# Patient Record
Sex: Female | Born: 1950 | Race: White | Hispanic: No | Marital: Married | Smoking: Never smoker
Health system: Southern US, Community
[De-identification: ages and names within clinical notes are randomized; demographics above are authoritative.]

## PROBLEM LIST (undated history)

## (undated) DIAGNOSIS — F419 Anxiety disorder, unspecified: Secondary | ICD-10-CM

## (undated) DIAGNOSIS — K7460 Unspecified cirrhosis of liver: Secondary | ICD-10-CM

## (undated) DIAGNOSIS — C801 Malignant (primary) neoplasm, unspecified: Secondary | ICD-10-CM

## (undated) DIAGNOSIS — R112 Nausea with vomiting, unspecified: Secondary | ICD-10-CM

## (undated) DIAGNOSIS — I10 Essential (primary) hypertension: Secondary | ICD-10-CM

## (undated) DIAGNOSIS — D519 Vitamin B12 deficiency anemia, unspecified: Principal | ICD-10-CM

## (undated) DIAGNOSIS — I499 Cardiac arrhythmia, unspecified: Secondary | ICD-10-CM

## (undated) DIAGNOSIS — Z974 Presence of external hearing-aid: Secondary | ICD-10-CM

## (undated) DIAGNOSIS — Z9621 Cochlear implant status: Secondary | ICD-10-CM

## (undated) DIAGNOSIS — M797 Fibromyalgia: Secondary | ICD-10-CM

## (undated) DIAGNOSIS — K219 Gastro-esophageal reflux disease without esophagitis: Secondary | ICD-10-CM

## (undated) DIAGNOSIS — M1990 Unspecified osteoarthritis, unspecified site: Secondary | ICD-10-CM

## (undated) DIAGNOSIS — Z9889 Other specified postprocedural states: Secondary | ICD-10-CM

## (undated) HISTORY — DX: Malignant (primary) neoplasm, unspecified: C80.1

## (undated) HISTORY — DX: Unspecified osteoarthritis, unspecified site: M19.90

## (undated) HISTORY — PX: APPENDECTOMY: SHX54

## (undated) HISTORY — DX: Essential (primary) hypertension: I10

## (undated) HISTORY — PX: COCHLEAR IMPLANT: SUR684

## (undated) HISTORY — DX: Unspecified cirrhosis of liver: K74.60

## (undated) HISTORY — DX: Anxiety disorder, unspecified: F41.9

## (undated) HISTORY — DX: Fibromyalgia: M79.7

## (undated) HISTORY — DX: Vitamin B12 deficiency anemia, unspecified: D51.9

## (undated) HISTORY — DX: Gastro-esophageal reflux disease without esophagitis: K21.9

## (undated) HISTORY — PX: COMBINED HYSTEROSCOPY DIAGNOSTIC / D&C: SUR297

---

## 1998-07-06 ENCOUNTER — Other Ambulatory Visit: Admission: RE | Admit: 1998-07-06 | Discharge: 1998-07-06 | Payer: Self-pay | Admitting: Obstetrics and Gynecology

## 1998-12-10 ENCOUNTER — Encounter: Payer: Self-pay | Admitting: Endocrinology

## 1998-12-10 ENCOUNTER — Ambulatory Visit (HOSPITAL_COMMUNITY): Admission: RE | Admit: 1998-12-10 | Discharge: 1998-12-10 | Payer: Self-pay | Admitting: Endocrinology

## 1999-01-20 ENCOUNTER — Encounter (INDEPENDENT_AMBULATORY_CARE_PROVIDER_SITE_OTHER): Payer: Self-pay | Admitting: Specialist

## 1999-01-20 ENCOUNTER — Ambulatory Visit (HOSPITAL_COMMUNITY): Admission: RE | Admit: 1999-01-20 | Discharge: 1999-01-20 | Payer: Self-pay | Admitting: *Deleted

## 1999-01-20 ENCOUNTER — Encounter: Payer: Self-pay | Admitting: *Deleted

## 1999-08-12 ENCOUNTER — Other Ambulatory Visit: Admission: RE | Admit: 1999-08-12 | Discharge: 1999-08-12 | Payer: Self-pay | Admitting: Obstetrics and Gynecology

## 1999-08-25 ENCOUNTER — Encounter: Admission: RE | Admit: 1999-08-25 | Discharge: 1999-11-23 | Payer: Self-pay | Admitting: Gynecology

## 2000-08-14 ENCOUNTER — Other Ambulatory Visit: Admission: RE | Admit: 2000-08-14 | Discharge: 2000-08-14 | Payer: Self-pay | Admitting: Obstetrics and Gynecology

## 2001-09-13 ENCOUNTER — Other Ambulatory Visit: Admission: RE | Admit: 2001-09-13 | Discharge: 2001-09-13 | Payer: Self-pay | Admitting: Obstetrics and Gynecology

## 2002-07-31 ENCOUNTER — Encounter: Admission: RE | Admit: 2002-07-31 | Discharge: 2002-07-31 | Payer: Self-pay

## 2002-09-23 ENCOUNTER — Other Ambulatory Visit: Admission: RE | Admit: 2002-09-23 | Discharge: 2002-09-23 | Payer: Self-pay | Admitting: Obstetrics and Gynecology

## 2002-12-09 ENCOUNTER — Encounter: Payer: Self-pay | Admitting: Internal Medicine

## 2002-12-09 ENCOUNTER — Encounter: Admission: RE | Admit: 2002-12-09 | Discharge: 2002-12-09 | Payer: Self-pay | Admitting: Internal Medicine

## 2003-11-13 ENCOUNTER — Other Ambulatory Visit: Admission: RE | Admit: 2003-11-13 | Discharge: 2003-11-13 | Payer: Self-pay | Admitting: Obstetrics and Gynecology

## 2004-11-15 ENCOUNTER — Other Ambulatory Visit: Admission: RE | Admit: 2004-11-15 | Discharge: 2004-11-15 | Payer: Self-pay | Admitting: Obstetrics and Gynecology

## 2005-09-13 ENCOUNTER — Encounter: Admission: RE | Admit: 2005-09-13 | Discharge: 2005-09-13 | Payer: Self-pay | Admitting: Family Medicine

## 2006-01-04 ENCOUNTER — Other Ambulatory Visit: Admission: RE | Admit: 2006-01-04 | Discharge: 2006-01-04 | Payer: Self-pay | Admitting: Obstetrics and Gynecology

## 2007-01-31 ENCOUNTER — Other Ambulatory Visit: Admission: RE | Admit: 2007-01-31 | Discharge: 2007-01-31 | Payer: Self-pay | Admitting: Obstetrics and Gynecology

## 2008-02-05 ENCOUNTER — Ambulatory Visit: Payer: Self-pay | Admitting: Obstetrics and Gynecology

## 2008-02-05 ENCOUNTER — Encounter: Payer: Self-pay | Admitting: Obstetrics and Gynecology

## 2008-02-05 ENCOUNTER — Other Ambulatory Visit: Admission: RE | Admit: 2008-02-05 | Discharge: 2008-02-05 | Payer: Self-pay | Admitting: Obstetrics and Gynecology

## 2009-03-16 ENCOUNTER — Encounter: Payer: Self-pay | Admitting: Obstetrics and Gynecology

## 2009-03-16 ENCOUNTER — Other Ambulatory Visit: Admission: RE | Admit: 2009-03-16 | Discharge: 2009-03-16 | Payer: Self-pay | Admitting: Obstetrics and Gynecology

## 2009-03-16 ENCOUNTER — Ambulatory Visit: Payer: Self-pay | Admitting: Obstetrics and Gynecology

## 2009-05-06 ENCOUNTER — Ambulatory Visit: Payer: Self-pay | Admitting: Obstetrics and Gynecology

## 2010-05-23 ENCOUNTER — Encounter: Payer: Self-pay | Admitting: Family Medicine

## 2012-01-31 ENCOUNTER — Encounter: Payer: Self-pay | Admitting: Obstetrics and Gynecology

## 2012-02-07 ENCOUNTER — Encounter: Payer: Self-pay | Admitting: Obstetrics and Gynecology

## 2012-02-08 ENCOUNTER — Other Ambulatory Visit: Payer: Self-pay | Admitting: Radiology

## 2012-02-09 ENCOUNTER — Encounter: Payer: Self-pay | Admitting: Obstetrics and Gynecology

## 2012-02-09 ENCOUNTER — Other Ambulatory Visit: Payer: Self-pay | Admitting: Radiology

## 2012-02-09 DIAGNOSIS — C50912 Malignant neoplasm of unspecified site of left female breast: Secondary | ICD-10-CM

## 2012-02-13 ENCOUNTER — Other Ambulatory Visit: Payer: Self-pay | Admitting: *Deleted

## 2012-02-13 ENCOUNTER — Telehealth: Payer: Self-pay | Admitting: *Deleted

## 2012-02-13 DIAGNOSIS — C50419 Malignant neoplasm of upper-outer quadrant of unspecified female breast: Secondary | ICD-10-CM | POA: Insufficient documentation

## 2012-02-13 NOTE — Telephone Encounter (Signed)
Confirmed [facility] for 02/15/12 at 0800 .  Instructions and contact information given.

## 2012-02-14 ENCOUNTER — Other Ambulatory Visit: Payer: Self-pay

## 2012-02-15 ENCOUNTER — Telehealth: Payer: Self-pay | Admitting: *Deleted

## 2012-02-15 ENCOUNTER — Encounter (INDEPENDENT_AMBULATORY_CARE_PROVIDER_SITE_OTHER): Payer: Self-pay | Admitting: General Surgery

## 2012-02-15 ENCOUNTER — Ambulatory Visit (HOSPITAL_BASED_OUTPATIENT_CLINIC_OR_DEPARTMENT_OTHER): Payer: BC Managed Care – PPO | Admitting: Oncology

## 2012-02-15 ENCOUNTER — Encounter: Payer: Self-pay | Admitting: Oncology

## 2012-02-15 ENCOUNTER — Ambulatory Visit (HOSPITAL_BASED_OUTPATIENT_CLINIC_OR_DEPARTMENT_OTHER): Payer: Self-pay | Admitting: General Surgery

## 2012-02-15 ENCOUNTER — Ambulatory Visit
Admission: RE | Admit: 2012-02-15 | Discharge: 2012-02-15 | Disposition: A | Discharge status: Home / Self Care | Payer: BC Managed Care – PPO | Source: Ambulatory Visit | Attending: Radiation Oncology | Admitting: Radiation Oncology

## 2012-02-15 ENCOUNTER — Other Ambulatory Visit (HOSPITAL_BASED_OUTPATIENT_CLINIC_OR_DEPARTMENT_OTHER): Payer: BC Managed Care – PPO | Admitting: Lab

## 2012-02-15 ENCOUNTER — Ambulatory Visit: Payer: BC Managed Care – PPO

## 2012-02-15 ENCOUNTER — Ambulatory Visit: Payer: BC Managed Care – PPO | Attending: General Surgery | Admitting: Physical Therapy

## 2012-02-15 ENCOUNTER — Encounter: Payer: Self-pay | Admitting: *Deleted

## 2012-02-15 VITALS — BP 164/76 | HR 69 | Temp 98.4°F | Resp 20 | Ht 61.0 in | Wt 207.3 lb

## 2012-02-15 DIAGNOSIS — C50419 Malignant neoplasm of upper-outer quadrant of unspecified female breast: Secondary | ICD-10-CM

## 2012-02-15 DIAGNOSIS — IMO0001 Reserved for inherently not codable concepts without codable children: Secondary | ICD-10-CM | POA: Insufficient documentation

## 2012-02-15 DIAGNOSIS — R293 Abnormal posture: Secondary | ICD-10-CM | POA: Insufficient documentation

## 2012-02-15 DIAGNOSIS — C50919 Malignant neoplasm of unspecified site of unspecified female breast: Secondary | ICD-10-CM | POA: Insufficient documentation

## 2012-02-15 DIAGNOSIS — Z171 Estrogen receptor negative status [ER-]: Secondary | ICD-10-CM

## 2012-02-15 DIAGNOSIS — M2550 Pain in unspecified joint: Secondary | ICD-10-CM | POA: Insufficient documentation

## 2012-02-15 DIAGNOSIS — M25619 Stiffness of unspecified shoulder, not elsewhere classified: Secondary | ICD-10-CM | POA: Insufficient documentation

## 2012-02-15 LAB — CBC WITH DIFFERENTIAL/PLATELET
Basophils Absolute: 0 10*3/uL (ref 0.0–0.1)
EOS%: 4.1 % (ref 0.0–7.0)
HGB: 12.5 g/dL (ref 11.6–15.9)
MCH: 30.5 pg (ref 25.1–34.0)
MCHC: 34.3 g/dL (ref 31.5–36.0)
MCV: 88.7 fL (ref 79.5–101.0)
MONO%: 7 % (ref 0.0–14.0)
RDW: 14 % (ref 11.2–14.5)

## 2012-02-15 LAB — COMPREHENSIVE METABOLIC PANEL (CC13)
AST: 46 U/L — ABNORMAL HIGH (ref 5–34)
Albumin: 3.9 g/dL (ref 3.5–5.0)
Alkaline Phosphatase: 89 U/L (ref 40–150)
BUN: 9 mg/dL (ref 7.0–26.0)
Creatinine: 1.1 mg/dL (ref 0.6–1.1)
Potassium: 3.5 mEq/L (ref 3.5–5.1)

## 2012-02-15 MED ORDER — PROCHLORPERAZINE MALEATE 10 MG PO TABS: 10.0000 mg | ORAL_TABLET | Freq: Four times a day (QID) | ORAL | Status: DC | PRN

## 2012-02-15 MED ORDER — PROCHLORPERAZINE 25 MG RE SUPP: 25.0000 mg | Freq: Two times a day (BID) | RECTAL | Status: DC | PRN

## 2012-02-15 MED ORDER — DEXAMETHASONE 4 MG PO TABS: 8.0000 mg | ORAL_TABLET | Freq: Two times a day (BID) | ORAL | Status: DC

## 2012-02-15 MED ORDER — ONDANSETRON HCL 8 MG PO TABS: 8.0000 mg | ORAL_TABLET | Freq: Two times a day (BID) | ORAL | Status: DC

## 2012-02-15 MED ORDER — LORAZEPAM 0.5 MG PO TABS: 0.5000 mg | ORAL_TABLET | Freq: Four times a day (QID) | ORAL | Status: DC | PRN

## 2012-02-15 NOTE — Patient Instructions (Addendum)
Neoadjuvant chemotherapy with [facility]  I will see you back in 10/29

## 2012-02-15 NOTE — Telephone Encounter (Signed)
Gave patient appointment for 02-28-2012 starting at 10:00am   Gave patient echo appointment for 02-21-2012 arrival time 8:45am  Gave patient lab appointments for lab only starting at 03-06-2012

## 2012-02-15 NOTE — Progress Notes (Signed)
Checked in new pt with no financial concerns. °

## 2012-02-15 NOTE — Progress Notes (Signed)
[facility] Psychosocial Distress Screening Clinical Social Work  Patient completed distress screening protocol, and scored a 5 on the Psychosocial Distress Thermometer which indicates mild distress. Clinical Social Worker met with patient and patient's husband in [facility] to assess for distress and other psychosocial needs.  Pt stated she felt a much lower level of distress after meeting with the physicians and begin given her treatment plan.  CSW informed pt of the support team and support services at [facility], and pt was agreeable to a reach to recovery referral.  Pt also expressed interest in other support programs at [facility].  CSW encouraged pt to call with any questions or concerns.        Clinical Social Worker follow up needed:not at this time  Tamala Julian, MSW, LCSW Clinical Social Worker [facility] [phone]

## 2012-02-15 NOTE — Progress Notes (Signed)
Maria Mckinney [id number] 01-Aug-1950 61 y.o. 02/15/2012 11:38 AM  CC  [name], MD Dr. [name] [address] [place] [address] Dr. [name] Dr. [name] Dr. 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 Patient was seen in the Multidisciplinary Breast Clinic for discussion of her treatment options.  STAGE:   Cancer of upper-outer quadrant of female breast   Primary site: Breast (Left)   Staging method: AJCC 7th Edition   Clinical: Stage IA (T1, N0, cM0)   Summary: Stage IA (T1, N0, cM0)  REFERRING PHYSICIAN: Dr. [name]  HISTORY OF PRESENT ILLNESS:  Maria Mckinney is a 61 y.o. female.  With medical history significant for gastroesophageal reflux disease fibromyalgia arthritis anxiety and depression. Patient recently had a screening mammogram that showed an abnormality in the left breast. Ultrasound revealed a 8 mm lesion in the upper outer quadrant. 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 Tumor was ER negative PR negative HER-2/neu positive with Ki-67 of 85%. Patient was also found to have a 4 cm area of abnormal signal. Patient did not get an MRI of the breast secondary to cochlear implant. Patient is interested in breast conservation. Her case was discussed at the multidisciplinary breast conference. Her pathology and radiology were reviewed. She herself is without any significant complaints today. Treatment recommendations are based on NCCN guidelines for stage I breast   Past Medical History: Past Medical History  Diagnosis Date  . HTN (hypertension)   . Fibromyalgia   . GERD (gastroesophageal reflux disease)   . Arthritis   . Anxiety     Past Surgical History: Past Surgical History  Procedure Date  . Appendectomy   . Combined hysteroscopy diagnostic / d&c     Family History: Family  History  Problem Relation Age of Onset  . Cancer Mother     "tumor of face removed"  . Cancer Father     kidney removed  . Cancer Maternal Aunt     stomach & Pancreatic  . Cancer Paternal Uncle     pancreatic    Social History History  Substance Use Topics  . Smoking status: Never Smoker   . Smokeless tobacco: Not on file  . Alcohol Use: 0.0 oz/week    0-1 Glasses of wine per week    Allergies: No Known Allergies  Current Medications: Current Outpatient Prescriptions  Medication Sig Dispense Refill  . bisoprolol-hydrochlorothiazide (ZIAC) 5-6.25 MG per tablet Take 1 tablet by mouth daily.      . cyclobenzaprine (FLEXERIL) 10 MG tablet Take 10 mg by mouth 3 (three) times daily as needed.      . DULoxetine (CYMBALTA) 60 MG capsule Take 60 mg by mouth daily.      Marland Kitchen HYDROcodone-acetaminophen (VICODIN) 5-500 MG per tablet Take 1 tablet by mouth every 6 (six) hours as needed.      Marland Kitchen levothyroxine (SYNTHROID, LEVOTHROID) 100 MCG tablet Take 100 mcg by mouth daily.      . meloxicam (MOBIC) 15 MG tablet Take 15 mg by mouth daily.      . mometasone (NASONEX) 50 MCG/ACT nasal spray Place 2 sprays into the nose daily.      . ranitidine (ZANTAC) 300 MG tablet Take 300 mg by mouth at bedtime.        OB/GYN History:Patient's menarche was at age 45 she underwent menopause in 35. She has been on  hormone replacement therapy for about 15-20 years she stopped about 5-10 years ago. She has not given any birth she has never been pregnant. She is therefore not was.  Fertility Discussion:Not applicable Prior History of Cancer:Patient had skin cancer of the face in 2000 and which was removed by her dermatologist and [place].  Health Maintenance:  ColonoscopyShe has never had a colonoscopy Bone DensityBone density about 10-12 years ago Last PAP smearLast Pap smear was in 2012  ECOG PERFORMANCE STATUS: 1 - Symptomatic but completely ambulatory  Genetic Counseling/testing:Patient's family  history is reviewed her father had kidney cancer at age 92 he underwent a nephrectomy mom had tumor of the face at age 19 maternal aunt had stomach and pancreatic cancer another maternal aunt had facial cancer and patients sister had pancreas cancer. Based on this history I am recommending her for genetic counseling and possibly testing.  REVIEW OF SYSTEMS:  Will go well nourished female she appears well she denies any fevers chills night sweats headaches shortness of breath chest pains palpitations no myalgias and arthralgias she does have tenderness in the left breast there is an area of ecchymosis. She has no dominant no pain no epigastric tenderness she has not noticed any bleeding problems no hematuria hematochezia melena hemoptysis or hematemesis. She does have muscular and arthritic type of pain secondary to her fibromyalgia. She does have an irregular heartbeat she is on Ziac and that controls this she does use a CPAP because of sleep apnea she is on Zegerid for acid reflux she does have some numbness she has anxiety and depression. She does think that Cymbalta controls this. She otherwise denies any urinary complaints. She has no rashes. No shortness of breath remainder of the 14 point review of systems is negative.  PHYSICAL EXAMINATION: Blood pressure 164/76, pulse 69, temperature 98.4 F (36.9 C), temperature source Oral, resp. rate 20, height 5\' 1"  (1.549 m), weight 207 lb 4.8 oz (94.031 kg).  Well-developed nourished female in no acute distress  HEENT exam EOMI PERRLA sclerae anicteric no conjunctival pallor oral mucosa is moist neck is suppleLungs are clear to auscultation cardiovascular is regular rate rhythm abdomen is soft nontender nondistended bowel sounds are present no HSM extremities no edema neuro patient's alert oriented otherwise nonfocal right breast exam no masses nipple discharge left breast reveals an area of ecchymosis and hematoma palpable otherwise no nipple discharge or  any other dominant masses.  STUDIES/RESULTS: No results found.   LABS:    Chemistry      Component Value Date/Time   NA 138 02/15/2012 0825   K 3.5 02/15/2012 0825   CL 102 02/15/2012 0825   CO2 26 02/15/2012 0825   BUN 9.0 02/15/2012 0825   CREATININE 1.1 02/15/2012 0825      Component Value Date/Time   CALCIUM 10.1 02/15/2012 0825   ALKPHOS 89 02/15/2012 0825   AST 46* 02/15/2012 0825   ALT 61* 02/15/2012 0825   BILITOT 0.50 02/15/2012 0825      Lab Results  Component Value Date   WBC 3.5* 02/15/2012   HGB 12.5 02/15/2012   HCT 36.4 02/15/2012   MCV 88.7 02/15/2012   PLT 126* 02/15/2012       PATHOLOGY:  ADDITIONAL INFORMATION: PROGNOSTIC INDICATORS - ACIS Results IMMUNOHISTOCHEMICAL AND MORPHOMETRIC ANALYSIS BY THE AUTOMATED CELLULAR IMAGING SYSTEM (ACIS) Estrogen Receptor (Negative, <1%): 0%, NEGATIVE Progesterone Receptor (Negative, <1%): 0%, NEGATIVE Proliferation Marker Ki67 by M IB-1 (Low<20%): 98% All controls stained appropriately Pecola Leisure MD Pathologist, Electronic  Signature ( Signed 02/15/2012) CHROMOGENIC IN-SITU HYBRIDIZATION Interpretation: HER2/NEU BY CISH - SHOWS AMPLIFICATION BY CISH ANALYSIS. THE RATIO OF HER2: CEP 17 SIGNALS WAS 2.55. 40 TUMOR CELLS WERE SCORED. OF NOTE, THE TUMOR APPEARS TO SHOW HETEROGENEITY IN REGARDS TO HER2 EXPRESSION. Reference range: Ratio: HER2:CEP17 < 1.8 gene amplification not observed Ratio: HER2:CEP 17 1.8-2.2 - equivocal result Ratio: HER2:CEP17 > 2.2 - gene amplification observed Pecola Leisure MD Pathologist, Electronic Signature ( Signed 02/15/2012) 1 of ADDITIONAL INFORMATION: PROGNOSTIC INDICATORS - ACIS Results IMMUNOHISTOCHEMICAL AND MORPHOMETRIC ANALYSIS BY THE AUTOMATED CELLULAR IMAGING SYSTEM (ACIS) Estrogen Receptor (Negative, <1%): 0%, NEGATIVE Progesterone Receptor (Negative, <1%): 0%, NEGATIVE COMMENT: The negative hormone receptor study(ies) in this case have an internal positive  control. 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 However patient does have a larger area than initially noted. And therefore a biopsy is recommended to verify that the 4 cm area of enhancement in fact is a malignancy. We did  discuss this with the patient She would like to proceed with the biopsy which will be performed on 02/16/2012.Due to the fact that patient's tumor is ER negative PR negative and HER-2/neu positive she is a good candidate for neoadjuvant HER-2-based chemotherapy. I have recommended Taxotere carboplatinum and Herceptin. With Taxotere Carboplatinum being given every 21 days And the Herceptin initially given weekly and once she completes her TC then we will convert the Herceptin to every 3 weeks. Once patient completes her chemotherapy we will go ahead and reevaluate her with another mammogram. Unfortunately we cannot do MRIs due to patient's cochlear implant. This is explained to the patient. Patient also understands that in spite of the neoadjuvant approach there may still be a very real possibility that she may still need a mastectomy. However we will make a final decision once we know what her response to chemotherapy as.    PLAN:    #1 patient will proceed with biopsy of the second area to determine whether this is in fact is a malignancy.  #2 patient will begin neoadjuvant chemotherapy consisting of Taxotere carboplatinum and Herceptin. Risks and benefits of this were discussed with the patient. She will need a Port-A-Cath placement. She will also require chemotherapy teaching class. I will refer her to the CHF clinic for monitoring of her heart function  during Herceptin therapy. She will also need an echocardiogram. Plan is to get the chemotherapy started towards the end of October.  #3 all questions were answered today. I will plan on seeing her back on 02/28/2012 in followup       Thank you so much for allowing me to participate in the care of Maria Mckinney. I will continue to follow up the patient with you and assist in her care.  All questions were answered. The patient knows to call the clinic with any problems, questions or concerns. We can certainly see the patient much sooner if necessary.  I spent 60 minutes counseling the patient face to face. The total time spent in the appointment was 60 minutes.  Drue Second, MD Medical/Oncology [facility] [phone] (beeper) [phone] (Office)  02/15/2012, 11:39 AM

## 2012-02-15 NOTE — Progress Notes (Signed)
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 female  who underwent a screening mammogram. It an abnormality was noted in the left breast. Ultrasound confirmed an 8 mm lesion in the upper outer quadrant of the breast. A stereotactic biopsy was performed. Unfortunately the clip migrated about 5 cm away. Pathology from the biopsy showed a grade 2-3 invasive ductal carcinoma with micropapillary features. ER PR negative HER-2 positive with a Ki-67 of 85%. Upon further review the mammogram there is about a 4 cm area of abnormal signal which was concerning. She cannot receive an MRI secondary to having a cochlear implant. She is interested in breast conservation.  PREVIOUS RADIATION THERAPY: No  PAST MEDICAL HISTORY:  has a past medical history of HTN (hypertension); Fibromyalgia; GERD (gastroesophageal reflux disease); Arthritis; and Anxiety.    PAST SURGICAL HISTORY: Past Surgical History  Procedure Date  . Appendectomy   . Combined hysteroscopy diagnostic / d&c     FAMILY HISTORY: family history includes Cancer in her father, maternal aunt, mother, and paternal uncle.  SOCIAL HISTORY:  reports that she has never smoked. She does not have any smokeless tobacco history on file. She reports that she drinks alcohol. She reports that she does not use illicit drugs.  ALLERGIES: Review of patient's allergies indicates no known allergies.  MEDICATIONS:  Current Outpatient Prescriptions  Medication Sig Dispense Refill  . bisoprolol-hydrochlorothiazide (ZIAC) 5-6.25 MG per tablet Take 1 tablet by mouth daily.      . cyclobenzaprine (FLEXERIL) 10 MG tablet Take 10 mg by  mouth 3 (three) times daily as needed.      . DULoxetine (CYMBALTA) 60 MG capsule Take 60 mg by mouth daily.      Marland Kitchen HYDROcodone-acetaminophen (VICODIN) 5-500 MG per tablet Take 1 tablet by mouth every 6 (six) hours as needed.      Marland Kitchen levothyroxine (SYNTHROID, LEVOTHROID) 100 MCG tablet Take 100 mcg by mouth daily.      . meloxicam (MOBIC) 15 MG tablet Take 15 mg by mouth daily.      . mometasone (NASONEX) 50 MCG/ACT nasal spray Place 2 sprays into the nose daily.      . ranitidine (ZANTAC) 300 MG tablet Take 300 mg by mouth at bedtime.        REVIEW OF SYSTEMS:  A 15 point review of systems is documented in the electronic medical record. This was obtained by the nursing staff. However, I reviewed this with the patient to discuss relevant findings and make appropriate changes.  Pertinent items are noted in HPI.   PHYSICAL EXAM:  Obese female in no distress. No lymphedema. Small biopsy site on left breast. No palpable abnormalities of the right breast. Alert, awake. Cranial nerves II-XII intact.   LABORATORY DATA:  Lab Results  Component Value Date   WBC 3.5* 02/15/2012   HGB 12.5 02/15/2012   HCT 36.4 02/15/2012   MCV 88.7 02/15/2012   PLT 126* 02/15/2012   No results found for this basename: NA, K, CL, CO2   No results found for this basename: ALT, AST, GGT, ALKPHOS, BILITOT     RADIOGRAPHY: No results found.    IMPRESSION: T2N0 Infiltrating Ductal Carcinoma of the left breast  PLAN: Ms. daily has discussed neoadjuvant chemotherapy followed by lumpectomy and sentinel lymph node. I think it's important part of a second biopsy both for clip placement and to verify that this 4 cm area of enhancement is in fact involved with cancer and its not just the 8 mm area seen on ultrasound. We discussed that if she does undergo neoadjuvant chemotherapy even if she has a pathologic complete response she would still need postoperative radiation. We discussed the role of radiation in decreasing local  failures. We discussed the process of simulation the placement tattoos. We discussed the possible side effects of treatment including but not limited to skin redness and fatigue. We discussed the use of breath hold technique for cardiac sparing. We discussed the possibility of lung damage. At this point the plan is a second biopsy, port placement and neoadjuvant chemotherapy. This would be followed by lumpectomy and sentinel lymph node. She then can be referred to radiation and of course continue her one year of Herceptin. She met with medical oncology, surgery and, member of our patient family support team as well as our physical therapist. I spent 60 minutes  face to face with the patient and more than 50% of that time was spent in counseling and/or coordination of care.   ------------------------------------------------  [name], MD

## 2012-02-15 NOTE — Progress Notes (Signed)
Patient ID: Maria Mckinney, female   DOB: 10-03-1950, 61 y.o.   MRN: [id number]  Chief Complaint  Patient presents with  . Other    breast cancer    HPI Maria Mckinney is a 61 y.o. female.  Referred by Dr. Eda Paschal HPI This is a 61 year old female who has no complaints referable to her breasts who presented for her routine screening mammogram this year. She underwent this mammogram was found to have a potential asymmetry in the left upper outer quadrant. Her breasts are almost completely fatty-replaced and are less than 25% glandular. She underwent magnification views which showed the linear asymmetry persisting in the upper outer quadrant of the left breast. A nodular component is seen posteriorly. There were a few microcalcifications within this area as well. Ultrasound was performed in the 1:00 position 9 cm from the nipple that showed a focal area of increased echogenicity that measured 8 mm in diameter. There are unremarkable axillary nodes. She underwent a stereotactic biopsy in the clip migrated about 5 cm from the actual lesion. 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 This is either grade 2 or 3. This is hormone receptor negative. Her 2 new is amplified at 2.55. There does appear to be some heterogeneity ago. Her proliferation index is 85%. She comes in today to discuss her different treatment options. She is a family history of breast cancer. She does have a first cousin with ovarian cancer she thinks. She does have a history of irregular heartbeats for which she's been released by cardiology. She cannot get an MRI due to the fact that she has a cochlear implant. Past Medical History  Diagnosis Date  . HTN (hypertension)   . Fibromyalgia   . GERD (gastroesophageal reflux disease)   . Arthritis   . Anxiety     Past Surgical History  Procedure Date  . Appendectomy   . Combined hysteroscopy diagnostic / d&c   . Cochlear implant      Family History  Problem Relation Age of Onset  . Cancer Mother     "tumor of face removed"  . Cancer Father     kidney removed  . Cancer Maternal Aunt     stomach & Pancreatic  . Cancer Paternal Uncle     pancreatic    Social History History  Substance Use Topics  . Smoking status: Never Smoker   . Smokeless tobacco: Not on file  . Alcohol Use: 0.0 oz/week    0-1 Glasses of wine per week    No Known Allergies  Current Outpatient Prescriptions  Medication Sig Dispense Refill  . bisoprolol-hydrochlorothiazide (ZIAC) 5-6.25 MG per tablet Take 1 tablet by mouth daily.      . cyclobenzaprine (FLEXERIL) 10 MG tablet Take 10 mg by mouth 3 (three) times daily as needed.      Marland Kitchen dexamethasone (DECADRON) 4 MG tablet Take 2 tablets (8 mg total) by mouth 2 (two) times daily with a meal. Take two times a day the day before Taxotere. Then take two times a day starting the day after chemo for 3 days.  30 tablet  1  . DULoxetine (CYMBALTA) 60 MG capsule Take 60 mg by mouth daily.      Marland Kitchen HYDROcodone-acetaminophen (VICODIN) 5-500 MG per tablet Take 1 tablet by mouth every 6 (six) hours as needed.      Marland Kitchen levothyroxine (SYNTHROID, LEVOTHROID) 100 MCG tablet Take 100 mcg by mouth daily.      Marland Kitchen  LORazepam (ATIVAN) 0.5 MG tablet Take 1 tablet (0.5 mg total) by mouth every 6 (six) hours as needed (Nausea or vomiting).  30 tablet  0  . meloxicam (MOBIC) 15 MG tablet Take 15 mg by mouth daily.      . mometasone (NASONEX) 50 MCG/ACT nasal spray Place 2 sprays into the nose daily.      . ondansetron (ZOFRAN) 8 MG tablet Take 1 tablet (8 mg total) by mouth 2 (two) times daily. Take two times a day starting the day after chemo for 3 days. Then take two times a day as needed for nausea or vomiting.  30 tablet  1  . prochlorperazine (COMPAZINE) 10 MG tablet Take 1 tablet (10 mg total) by mouth every 6 (six) hours as needed (Nausea or vomiting).  30 tablet  1  . prochlorperazine (COMPAZINE) 25 MG suppository  Place 1 suppository (25 mg total) rectally every 12 (twelve) hours as needed for nausea.  12 suppository  3  . ranitidine (ZANTAC) 300 MG tablet Take 300 mg by mouth at bedtime.        Review of Systems Review of Systems  Constitutional: Positive for fatigue. Negative for fever, chills and unexpected weight change.  HENT: Positive for hearing loss. Negative for congestion, sore throat, trouble swallowing and voice change.   Eyes: Negative for visual disturbance.  Respiratory: Positive for apnea (osa uses cpap). Negative for cough and wheezing.   Cardiovascular: Positive for palpitations. Negative for chest pain and leg swelling.  Gastrointestinal: Negative for nausea, vomiting, abdominal pain, diarrhea, constipation, blood in stool, abdominal distention and anal bleeding.  Genitourinary: Negative for hematuria, vaginal bleeding and difficulty urinating.  Musculoskeletal: Positive for back pain and arthralgias.  Skin: Negative for rash and wound.  Neurological: Negative for seizures, syncope and headaches.  Hematological: Negative for adenopathy. Does not bruise/bleed easily.  Psychiatric/Behavioral: Positive for dysphoric mood. Negative for confusion.    There were no vitals taken for this visit.  Physical Exam Physical Exam  Vitals reviewed. Constitutional: She appears well-developed and well-nourished.  Neck: Neck supple.  Cardiovascular: Normal rate, regular rhythm and normal heart sounds.   Pulmonary/Chest: Effort normal and breath sounds normal. She has no wheezes. She has no rales. Right breast exhibits no inverted nipple, no mass, no nipple discharge, no skin change and no tenderness. Left breast exhibits no inverted nipple, no mass, no nipple discharge, no skin change and no tenderness. Breasts are symmetrical.  Lymphadenopathy:    She has no cervical adenopathy.    She has no axillary adenopathy.       Right: No supraclavicular adenopathy present.       Left: No  supraclavicular adenopathy present.    Data Reviewed Mmg, u/s and path reviewed  Assessment    Left breast cancer Her2 positive    Plan    Port placement and primary systemic therapy  I showed her her images today. We discussed her this morning with the radiologist as well. She has a small nodular area making this appear to be a clinical stage I tumor but there is a larger area on mammography that is concerning. This measures about 4 x 2 cm and it is basically a cylinder. I think this is pushing the limits with the size of her breasts if I  removed this in its entirety although I think we could do that now with a good cosmetic result. She may end up needing a symmetry procedure. In discussion with medical oncology she is  going to get treated with systemic therapy due to the fact that her hormone receptors are both negative, her HER-2/neu was positive and she has a high proliferation index. We a long talk about the benefits of switching therapy and beginning with primary systemic therapy. We will have to follow her by mammography as we cannot get an MRI. I think it would be reasonable to do this. I think even right now we can attempt to do a lumpectomy with a good result but we certainly can improve her cosmetic result and likelihood of positive margins if she were any get some response from primary systemic chemotherapy and Herceptin. We will plan on doing this. I discussed with her port placement today. The risk of this being been not limited to bleeding, infection, pneumothorax. We discussed would make a decision on her final surgery which likely be a lumpectomy and a sentinel lymph node biopsy at the completion of this therapy.       Jayana Kotula 02/15/2012, 1:15 PM

## 2012-02-16 ENCOUNTER — Other Ambulatory Visit: Payer: BC Managed Care – PPO

## 2012-02-16 ENCOUNTER — Telehealth: Payer: Self-pay | Admitting: *Deleted

## 2012-02-16 ENCOUNTER — Encounter (HOSPITAL_BASED_OUTPATIENT_CLINIC_OR_DEPARTMENT_OTHER): Payer: Self-pay | Admitting: *Deleted

## 2012-02-16 ENCOUNTER — Other Ambulatory Visit: Payer: Self-pay | Admitting: *Deleted

## 2012-02-16 ENCOUNTER — Encounter: Payer: Self-pay | Admitting: *Deleted

## 2012-02-16 ENCOUNTER — Encounter: Payer: Self-pay | Admitting: Oncology

## 2012-02-16 ENCOUNTER — Other Ambulatory Visit: Payer: Self-pay | Admitting: Radiology

## 2012-02-16 MED ORDER — LIDOCAINE-PRILOCAINE 2.5-2.5 % EX CREA: TOPICAL_CREAM | CUTANEOUS | Status: DC | PRN

## 2012-02-16 NOTE — Telephone Encounter (Signed)
Per staff message and POF I have scheduled appts.  JMW  

## 2012-02-16 NOTE — Progress Notes (Signed)
Enrolled pt in the [employer].  Faxed signed application and went online to activate card today.

## 2012-02-16 NOTE — Progress Notes (Signed)
To come be for ekg Had [facility] 02/15/12 Used to drr dr Lanelle Bal for irreg hr-was released 5 yr ago Getting echo for chemo 10/22

## 2012-02-20 ENCOUNTER — Telehealth: Payer: Self-pay | Admitting: *Deleted

## 2012-02-20 NOTE — Telephone Encounter (Signed)
Left voice message to inform the patient to come and see the md on 02-21-2012 at 10:00am

## 2012-02-21 ENCOUNTER — Ambulatory Visit: Payer: BC Managed Care – PPO | Admitting: Oncology

## 2012-02-21 ENCOUNTER — Other Ambulatory Visit: Payer: Self-pay

## 2012-02-21 ENCOUNTER — Encounter (HOSPITAL_BASED_OUTPATIENT_CLINIC_OR_DEPARTMENT_OTHER)
Admission: RE | Admit: 2012-02-21 | Discharge: 2012-02-21 | Disposition: A | Discharge status: Home / Self Care | Payer: BC Managed Care – PPO | Source: Ambulatory Visit | Attending: General Surgery | Admitting: General Surgery

## 2012-02-21 ENCOUNTER — Ambulatory Visit (HOSPITAL_COMMUNITY)
Admission: RE | Admit: 2012-02-21 | Discharge: 2012-02-21 | Disposition: A | Discharge status: Home / Self Care | Payer: BC Managed Care – PPO | Source: Ambulatory Visit | Attending: Oncology | Admitting: Oncology

## 2012-02-21 DIAGNOSIS — C50419 Malignant neoplasm of upper-outer quadrant of unspecified female breast: Secondary | ICD-10-CM

## 2012-02-21 DIAGNOSIS — Z01810 Encounter for preprocedural cardiovascular examination: Secondary | ICD-10-CM | POA: Insufficient documentation

## 2012-02-21 DIAGNOSIS — I519 Heart disease, unspecified: Secondary | ICD-10-CM

## 2012-02-21 NOTE — Progress Notes (Signed)
  Echocardiogram 2D Echocardiogram has been performed.  Maria Mckinney 02/21/2012, 10:43 AM

## 2012-02-22 ENCOUNTER — Encounter (HOSPITAL_BASED_OUTPATIENT_CLINIC_OR_DEPARTMENT_OTHER)
Admission: RE | Disposition: A | Discharge status: Home / Self Care | Payer: Self-pay | Source: Ambulatory Visit | Attending: General Surgery

## 2012-02-22 ENCOUNTER — Encounter (HOSPITAL_BASED_OUTPATIENT_CLINIC_OR_DEPARTMENT_OTHER): Payer: Self-pay | Admitting: Certified Registered Nurse Anesthetist

## 2012-02-22 ENCOUNTER — Encounter (HOSPITAL_BASED_OUTPATIENT_CLINIC_OR_DEPARTMENT_OTHER): Payer: Self-pay | Admitting: *Deleted

## 2012-02-22 ENCOUNTER — Ambulatory Visit (HOSPITAL_COMMUNITY): Payer: BC Managed Care – PPO

## 2012-02-22 ENCOUNTER — Ambulatory Visit (HOSPITAL_BASED_OUTPATIENT_CLINIC_OR_DEPARTMENT_OTHER): Payer: BC Managed Care – PPO | Admitting: Certified Registered Nurse Anesthetist

## 2012-02-22 ENCOUNTER — Ambulatory Visit (HOSPITAL_BASED_OUTPATIENT_CLINIC_OR_DEPARTMENT_OTHER)
Admission: RE | Admit: 2012-02-22 | Discharge: 2012-02-22 | Disposition: A | Discharge status: Home / Self Care | Payer: BC Managed Care – PPO | Source: Ambulatory Visit | Attending: General Surgery | Admitting: General Surgery

## 2012-02-22 DIAGNOSIS — C50919 Malignant neoplasm of unspecified site of unspecified female breast: Secondary | ICD-10-CM

## 2012-02-22 DIAGNOSIS — F411 Generalized anxiety disorder: Secondary | ICD-10-CM | POA: Insufficient documentation

## 2012-02-22 DIAGNOSIS — IMO0001 Reserved for inherently not codable concepts without codable children: Secondary | ICD-10-CM | POA: Insufficient documentation

## 2012-02-22 DIAGNOSIS — K219 Gastro-esophageal reflux disease without esophagitis: Secondary | ICD-10-CM | POA: Insufficient documentation

## 2012-02-22 DIAGNOSIS — Z01810 Encounter for preprocedural cardiovascular examination: Secondary | ICD-10-CM | POA: Insufficient documentation

## 2012-02-22 DIAGNOSIS — I10 Essential (primary) hypertension: Secondary | ICD-10-CM | POA: Insufficient documentation

## 2012-02-22 HISTORY — DX: Presence of external hearing-aid: Z97.4

## 2012-02-22 HISTORY — DX: Cochlear implant status: Z96.21

## 2012-02-22 HISTORY — PX: PORTACATH PLACEMENT: SHX2246

## 2012-02-22 HISTORY — DX: Nausea with vomiting, unspecified: R11.2

## 2012-02-22 HISTORY — DX: Cardiac arrhythmia, unspecified: I49.9

## 2012-02-22 HISTORY — DX: Other specified postprocedural states: Z98.890

## 2012-02-22 LAB — POCT HEMOGLOBIN-HEMACUE: Hemoglobin: 12.1 g/dL (ref 12.0–15.0)

## 2012-02-22 SURGERY — INSERTION, TUNNELED CENTRAL VENOUS DEVICE, WITH PORT
Anesthesia: General | Site: Chest | Laterality: Right | Wound class: Clean

## 2012-02-22 MED ORDER — OXYCODONE HCL 5 MG/5ML PO SOLN: 5.0000 mg | Freq: Once | ORAL | Status: AC | PRN

## 2012-02-22 MED ORDER — HEPARIN (PORCINE) IN NACL 2-0.9 UNIT/ML-% IJ SOLN
INTRAMUSCULAR | Status: DC | PRN
  Administered 2012-02-22: 1 via INTRAVENOUS

## 2012-02-22 MED ORDER — FENTANYL CITRATE 0.05 MG/ML IJ SOLN: 25.0000 ug | INTRAMUSCULAR | Status: DC | PRN

## 2012-02-22 MED ORDER — LACTATED RINGERS IV SOLN
INTRAVENOUS | Status: DC
  Administered 2012-02-22 (×2): via INTRAVENOUS

## 2012-02-22 MED ORDER — DEXAMETHASONE SODIUM PHOSPHATE 4 MG/ML IJ SOLN
INTRAMUSCULAR | Status: DC | PRN
  Administered 2012-02-22: 10 mg via INTRAVENOUS

## 2012-02-22 MED ORDER — OXYCODONE HCL 5 MG PO TABS
5.0000 mg | ORAL_TABLET | Freq: Once | ORAL | Status: AC | PRN
  Administered 2012-02-22: 5 mg via ORAL

## 2012-02-22 MED ORDER — OXYCODONE-ACETAMINOPHEN 5-325 MG PO TABS: 1.0000 | ORAL_TABLET | ORAL | Status: DC | PRN

## 2012-02-22 MED ORDER — BUPIVACAINE HCL (PF) 0.25 % IJ SOLN
INTRAMUSCULAR | Status: DC | PRN
  Administered 2012-02-22: 8 mL

## 2012-02-22 MED ORDER — MIDAZOLAM HCL 5 MG/5ML IJ SOLN
INTRAMUSCULAR | Status: DC | PRN
  Administered 2012-02-22: 1 mg via INTRAVENOUS

## 2012-02-22 MED ORDER — SCOPOLAMINE 1 MG/3DAYS TD PT72
1.0000 | MEDICATED_PATCH | Freq: Once | TRANSDERMAL | Status: DC
  Administered 2012-02-22: 1.5 mg via TRANSDERMAL

## 2012-02-22 MED ORDER — CEFAZOLIN SODIUM-DEXTROSE 2-3 GM-% IV SOLR
2.0000 g | INTRAVENOUS | Status: AC
  Administered 2012-02-22: 2 g via INTRAVENOUS

## 2012-02-22 MED ORDER — FENTANYL CITRATE 0.05 MG/ML IJ SOLN
INTRAMUSCULAR | Status: DC | PRN
  Administered 2012-02-22: 50 ug via INTRAVENOUS

## 2012-02-22 MED ORDER — LIDOCAINE HCL (CARDIAC) 20 MG/ML IV SOLN
INTRAVENOUS | Status: DC | PRN
  Administered 2012-02-22: 60 mg via INTRAVENOUS

## 2012-02-22 MED ORDER — ONDANSETRON HCL 4 MG/2ML IJ SOLN: 4.0000 mg | Freq: Four times a day (QID) | INTRAMUSCULAR | Status: DC | PRN

## 2012-02-22 MED ORDER — PROPOFOL 10 MG/ML IV BOLUS
INTRAVENOUS | Status: DC | PRN
  Administered 2012-02-22: 200 mg via INTRAVENOUS

## 2012-02-22 SURGICAL SUPPLY — 51 items
BAG DECANTER FOR FLEXI CONT (MISCELLANEOUS) ×2 IMPLANT
BENZOIN TINCTURE PRP APPL 2/3 (GAUZE/BANDAGES/DRESSINGS) IMPLANT
BLADE SURG 11 STRL SS (BLADE) ×2 IMPLANT
BLADE SURG 15 STRL LF DISP TIS (BLADE) ×2 IMPLANT
BLADE SURG 15 STRL SS (BLADE) ×2
CANISTER SUCTION 1200CC (MISCELLANEOUS) IMPLANT
CHLORAPREP W/TINT 26ML (MISCELLANEOUS) ×2 IMPLANT
CLOTH BEACON ORANGE TIMEOUT ST (SAFETY) ×2 IMPLANT
COVER MAYO STAND STRL (DRAPES) ×2 IMPLANT
COVER TABLE BACK 60X90 (DRAPES) ×2 IMPLANT
DECANTER SPIKE VIAL GLASS SM (MISCELLANEOUS) IMPLANT
DERMABOND ADVANCED (GAUZE/BANDAGES/DRESSINGS) ×1
DERMABOND ADVANCED .7 DNX12 (GAUZE/BANDAGES/DRESSINGS) ×1 IMPLANT
DRAPE C-ARM 42X72 X-RAY (DRAPES) ×2 IMPLANT
DRAPE LAPAROSCOPIC ABDOMINAL (DRAPES) ×2 IMPLANT
DRSG TEGADERM 4X4.75 (GAUZE/BANDAGES/DRESSINGS) IMPLANT
ELECT COATED BLADE 2.86 ST (ELECTRODE) ×2 IMPLANT
ELECT REM PT RETURN 9FT ADLT (ELECTROSURGICAL) ×2
ELECTRODE REM PT RTRN 9FT ADLT (ELECTROSURGICAL) ×1 IMPLANT
GAUZE SPONGE 4X4 12PLY STRL LF (GAUZE/BANDAGES/DRESSINGS) ×2 IMPLANT
GLOVE BIO SURGEON STRL SZ7 (GLOVE) ×2 IMPLANT
GLOVE BIOGEL PI IND STRL 7.0 (GLOVE) ×1 IMPLANT
GLOVE BIOGEL PI IND STRL 7.5 (GLOVE) ×1 IMPLANT
GLOVE BIOGEL PI INDICATOR 7.0 (GLOVE) ×1
GLOVE BIOGEL PI INDICATOR 7.5 (GLOVE) ×1
GLOVE ECLIPSE 6.5 STRL STRAW (GLOVE) ×2 IMPLANT
GOWN PREVENTION PLUS XLARGE (GOWN DISPOSABLE) ×4 IMPLANT
IV HEPARIN 1000UNITS/500ML (IV SOLUTION) ×2 IMPLANT
IV KIT MINILOC 20X1 SAFETY (NEEDLE) IMPLANT
KIT PORT POWER 8FR ISP CVUE (Catheter) ×2 IMPLANT
NDL SAFETY ECLIPSE 18X1.5 (NEEDLE) IMPLANT
NEEDLE HYPO 18GX1.5 SHARP (NEEDLE)
NEEDLE HYPO 25X1 1.5 SAFETY (NEEDLE) ×2 IMPLANT
PACK BASIN DAY SURGERY FS (CUSTOM PROCEDURE TRAY) ×2 IMPLANT
PENCIL BUTTON HOLSTER BLD 10FT (ELECTRODE) ×2 IMPLANT
SLEEVE SCD COMPRESS KNEE MED (MISCELLANEOUS) ×2 IMPLANT
STAPLER VISISTAT 35W (STAPLE) ×2 IMPLANT
STRIP CLOSURE SKIN 1/2X4 (GAUZE/BANDAGES/DRESSINGS) IMPLANT
SUT MON AB 4-0 PC3 18 (SUTURE) ×2 IMPLANT
SUT PROLENE 2 0 SH DA (SUTURE) ×6 IMPLANT
SUT SILK 2 0 TIES 17X18 (SUTURE)
SUT SILK 2-0 18XBRD TIE BLK (SUTURE) IMPLANT
SUT VIC AB 3-0 SH 27 (SUTURE) ×1
SUT VIC AB 3-0 SH 27X BRD (SUTURE) ×1 IMPLANT
SYR 5ML LUER SLIP (SYRINGE) ×2 IMPLANT
SYR CONTROL 10ML LL (SYRINGE) ×2 IMPLANT
TOWEL OR 17X24 6PK STRL BLUE (TOWEL DISPOSABLE) ×2 IMPLANT
TOWEL OR NON WOVEN STRL DISP B (DISPOSABLE) ×2 IMPLANT
TUBE CONNECTING 20X1/4 (TUBING) IMPLANT
WATER STERILE IRR 1000ML POUR (IV SOLUTION) ×2 IMPLANT
YANKAUER SUCT BULB TIP NO VENT (SUCTIONS) IMPLANT

## 2012-02-22 NOTE — Anesthesia Postprocedure Evaluation (Signed)
Anesthesia Post Note  Patient: Maria Mckinney  Procedure(s) Performed: Procedure(s) (LRB): INSERTION PORT-A-CATH (Right)  Anesthesia type: General  Patient location: PACU  Post pain: Pain level controlled and Adequate analgesia  Post assessment: Post-op Vital signs reviewed, Patient's Cardiovascular Status Stable, Respiratory Function Stable, Patent Airway and Pain level controlled  Last Vitals:  Filed Vitals:   02/22/12 1529  BP: 142/66  Pulse: 83  Temp: 37 C  Resp: 16    Post vital signs: Reviewed and stable  Level of consciousness: awake, alert  and oriented  Complications: No apparent anesthesia complications

## 2012-02-22 NOTE — Anesthesia Preprocedure Evaluation (Signed)
Anesthesia Evaluation  Patient identified by MRN, date of birth, ID band Patient awake    Reviewed: Allergy & Precautions, H&P , NPO status , Patient's Chart, lab work & pertinent test results  History of Anesthesia Complications (+) PONV  Airway Mallampati: II  Neck ROM: full    Dental   Pulmonary          Cardiovascular hypertension,     Neuro/Psych Anxiety  Neuromuscular disease    GI/Hepatic GERD-  ,  Endo/Other  Morbid obesity  Renal/GU      Musculoskeletal  (+) Fibromyalgia -  Abdominal   Peds  Hematology   Anesthesia Other Findings   Reproductive/Obstetrics                           Anesthesia Physical Anesthesia Plan  ASA: III  Anesthesia Plan: General   Post-op Pain Management:    Induction: Intravenous  Airway Management Planned: LMA  Additional Equipment:   Intra-op Plan:   Post-operative Plan:   Informed Consent: I have reviewed the patients History and Physical, chart, labs and discussed the procedure including the risks, benefits and alternatives for the proposed anesthesia with the patient or authorized representative who has indicated his/her understanding and acceptance.     Plan Discussed with: CRNA and Surgeon  Anesthesia Plan Comments:         Anesthesia Quick Evaluation

## 2012-02-22 NOTE — Interval H&P Note (Signed)
History and Physical Interval Note:  02/22/2012 12:33 PM  Maria Mckinney  has presented today for surgery, with the diagnosis of breast camcer  The various methods of treatment have been discussed with the patient and family. After consideration of risks, benefits and other options for treatment, the patient has consented to  Procedure(s) (LRB) with comments: INSERTION PORT-A-CATH (N/A) as a surgical intervention .  The patient's history has been reviewed, patient examined, no change in status, stable for surgery.  I have reviewed the patient's chart and labs.  Questions were answered to the patient's satisfaction.     Jalexia Lalli

## 2012-02-22 NOTE — Op Note (Signed)
Preoperative diagnosis: Diagnosed left breast cancer Postoperative diagnosis: Same as above Procedure: Right subclavian ClearVue Powerport insertion Surgeon: Dr. Harden Mo Anesthesia Gen. With LMA Complications: None Estimated blood loss: Minimal Drains: None Sponge and count correct x2 at end of operation Disposition to recovery stable  Indications: This 61 year old female was recently seen in the multidisciplinary breast clinic. She has a newly diagnosed HER-2/neu positive left breast cancer. She is unable to get an MRI due to her cochlear implant. On mammography she has about a 4 cm area of abnormality that includes an invasive ductal cancer as well as a component of ductal carcinoma in situ. We discussed all of her different options. I think this area will be pushing the limits for  breast conservation therapy right now with her breast size but would be possible. She is going to need systemic therapy due to her prognostic panel anyways and I think there may be some benefit of treating her first in a neoadjuvant setting followed by surgery. We will follow her by mammography. She and I discussed a port placement to begin primary systemic therapy.  Procedure: After informed consent was obtained the patient was taken to the operating room. She was administered 2 g of intravenous cefazolin. Sequential compression devices were placed. She was then placed under general anesthesia without complication. Her arms were tucked and appropriately padded. An axillary roll was placed. Her chest was then prepped and draped in the standard sterile surgical fashion. A surgical timeout was performed.  I anesthetized her right chest with quarter percent Marcaine. I then accessed her subclavian vein on the first pass. The wire was placed. There was some ectopy and I removed the wire a little bit. I then confirmed this with fluoroscopy. I then made a pocket below this overlying the pectoralis fascia. She has a lot  of soft tissue as well as her breasts that are in the way and it is difficult to make a pocket that can be accessed later.I then tunneled the line between the two. I then placed the dilator. I then placed the peel-away sheath and removed the wire assembly. This was done under fluoroscopy. There was a fair amount of ectopy while this was occurring. I then placed the line and removed the peel-away sheath. I pulled this back to be in the vena cava. I then attached to the port and it was kinked from where it was going through the tunnel. I pulled this back so the end of the line ends up being in the distal cava. It had trouble flushing when I had it any deeper. This is still in the cava and was not really that functional where it was deeper. Once I pulled it back and resutured it a couple of times to be in good position it aspirated blood easily and flushed easily. It was sutured into place in 2 positions with 2-0 Prolene suture. Final fluoroscopy showed that this was all in good position the port was very functional at this point as well. I then closes with 3-0 Vicryl, 4 Monocryl, and Dermabond. I also packed this with heparin prior to leaving the operating room.

## 2012-02-22 NOTE — Anesthesia Procedure Notes (Signed)
Procedure Name: LMA Insertion Date/Time: 02/22/2012 12:51 PM Performed by: Gar Gibbon Pre-anesthesia Checklist: Patient identified, Emergency Drugs available, Suction available and Patient being monitored Patient Re-evaluated:Patient Re-evaluated prior to inductionOxygen Delivery Method: Circle System Utilized Preoxygenation: Pre-oxygenation with 100% oxygen Intubation Type: IV induction Ventilation: Mask ventilation without difficulty LMA: LMA inserted LMA Size: 4.0 Number of attempts: 1 Airway Equipment and Method: bite block Placement Confirmation: positive ETCO2 Tube secured with: Tape Dental Injury: Teeth and Oropharynx as per pre-operative assessment

## 2012-02-22 NOTE — H&P (View-Only) (Signed)
Patient ID: Janeya K Brossard, female   DOB: 03/17/1951, 61 y.o.   MRN: [id number]  Chief Complaint  Patient presents with  . Other    breast cancer    HPI Lutisha K Amer is a 61 y.o. female.  Referred by Dr. Gottsegen HPI This is a 61-year-old female who has no complaints referable to her breasts who presented for her routine screening mammogram this year. She underwent this mammogram was found to have a potential asymmetry in the left upper outer quadrant. Her breasts are almost completely fatty-replaced and are less than 25% glandular. She underwent magnification views which showed the linear asymmetry persisting in the upper outer quadrant of the left breast. A nodular component is seen posteriorly. There were a few microcalcifications within this area as well. Ultrasound was performed in the 1:00 position 9 cm from the nipple that showed a focal area of increased echogenicity that measured 8 mm in diameter. There are unremarkable axillary nodes. She underwent a stereotactic biopsy in the clip migrated about 5 cm from the actual lesion. 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 This is either grade 2 or 3. This is hormone receptor negative. Her 2 new is amplified at 2.55. There does appear to be some heterogeneity ago. Her proliferation index is 85%. She comes in today to discuss her different treatment options. She is a family history of breast cancer. She does have a first cousin with ovarian cancer she thinks. She does have a history of irregular heartbeats for which she's been released by cardiology. She cannot get an MRI due to the fact that she has a cochlear implant. Past Medical History  Diagnosis Date  . HTN (hypertension)   . Fibromyalgia   . GERD (gastroesophageal reflux disease)   . Arthritis   . Anxiety     Past Surgical History  Procedure Date  . Appendectomy   . Combined hysteroscopy diagnostic / d&c   . Cochlear implant      Family History  Problem Relation Age of Onset  . Cancer Mother     "tumor of face removed"  . Cancer Father     kidney removed  . Cancer Maternal Aunt     stomach & Pancreatic  . Cancer Paternal Uncle     pancreatic    Social History History  Substance Use Topics  . Smoking status: Never Smoker   . Smokeless tobacco: Not on file  . Alcohol Use: 0.0 oz/week    0-1 Glasses of wine per week    No Known Allergies  Current Outpatient Prescriptions  Medication Sig Dispense Refill  . bisoprolol-hydrochlorothiazide (ZIAC) 5-6.25 MG per tablet Take 1 tablet by mouth daily.      . cyclobenzaprine (FLEXERIL) 10 MG tablet Take 10 mg by mouth 3 (three) times daily as needed.      . dexamethasone (DECADRON) 4 MG tablet Take 2 tablets (8 mg total) by mouth 2 (two) times daily with a meal. Take two times a day the day before Taxotere. Then take two times a day starting the day after chemo for 3 days.  30 tablet  1  . DULoxetine (CYMBALTA) 60 MG capsule Take 60 mg by mouth daily.      . HYDROcodone-acetaminophen (VICODIN) 5-500 MG per tablet Take 1 tablet by mouth every 6 (six) hours as needed.      . levothyroxine (SYNTHROID, LEVOTHROID) 100 MCG tablet Take 100 mcg by mouth daily.      .   LORazepam (ATIVAN) 0.5 MG tablet Take 1 tablet (0.5 mg total) by mouth every 6 (six) hours as needed (Nausea or vomiting).  30 tablet  0  . meloxicam (MOBIC) 15 MG tablet Take 15 mg by mouth daily.      . mometasone (NASONEX) 50 MCG/ACT nasal spray Place 2 sprays into the nose daily.      . ondansetron (ZOFRAN) 8 MG tablet Take 1 tablet (8 mg total) by mouth 2 (two) times daily. Take two times a day starting the day after chemo for 3 days. Then take two times a day as needed for nausea or vomiting.  30 tablet  1  . prochlorperazine (COMPAZINE) 10 MG tablet Take 1 tablet (10 mg total) by mouth every 6 (six) hours as needed (Nausea or vomiting).  30 tablet  1  . prochlorperazine (COMPAZINE) 25 MG suppository  Place 1 suppository (25 mg total) rectally every 12 (twelve) hours as needed for nausea.  12 suppository  3  . ranitidine (ZANTAC) 300 MG tablet Take 300 mg by mouth at bedtime.        Review of Systems Review of Systems  Constitutional: Positive for fatigue. Negative for fever, chills and unexpected weight change.  HENT: Positive for hearing loss. Negative for congestion, sore throat, trouble swallowing and voice change.   Eyes: Negative for visual disturbance.  Respiratory: Positive for apnea (osa uses cpap). Negative for cough and wheezing.   Cardiovascular: Positive for palpitations. Negative for chest pain and leg swelling.  Gastrointestinal: Negative for nausea, vomiting, abdominal pain, diarrhea, constipation, blood in stool, abdominal distention and anal bleeding.  Genitourinary: Negative for hematuria, vaginal bleeding and difficulty urinating.  Musculoskeletal: Positive for back pain and arthralgias.  Skin: Negative for rash and wound.  Neurological: Negative for seizures, syncope and headaches.  Hematological: Negative for adenopathy. Does not bruise/bleed easily.  Psychiatric/Behavioral: Positive for dysphoric mood. Negative for confusion.    There were no vitals taken for this visit.  Physical Exam Physical Exam  Vitals reviewed. Constitutional: She appears well-developed and well-nourished.  Neck: Neck supple.  Cardiovascular: Normal rate, regular rhythm and normal heart sounds.   Pulmonary/Chest: Effort normal and breath sounds normal. She has no wheezes. She has no rales. Right breast exhibits no inverted nipple, no mass, no nipple discharge, no skin change and no tenderness. Left breast exhibits no inverted nipple, no mass, no nipple discharge, no skin change and no tenderness. Breasts are symmetrical.  Lymphadenopathy:    She has no cervical adenopathy.    She has no axillary adenopathy.       Right: No supraclavicular adenopathy present.       Left: No  supraclavicular adenopathy present.    Data Reviewed Mmg, u/s and path reviewed  Assessment    Left breast cancer Her2 positive    Plan    Port placement and primary systemic therapy  I showed her her images today. We discussed her this morning with the radiologist as well. She has a small nodular area making this appear to be a clinical stage I tumor but there is a larger area on mammography that is concerning. This measures about 4 x 2 cm and it is basically a cylinder. I think this is pushing the limits with the size of her breasts if I  removed this in its entirety although I think we could do that now with a good cosmetic result. She may end up needing a symmetry procedure. In discussion with medical oncology she is   going to get treated with systemic therapy due to the fact that her hormone receptors are both negative, her HER-2/neu was positive and she has a high proliferation index. We a long talk about the benefits of switching therapy and beginning with primary systemic therapy. We will have to follow her by mammography as we cannot get an MRI. I think it would be reasonable to do this. I think even right now we can attempt to do a lumpectomy with a good result but we certainly can improve her cosmetic result and likelihood of positive margins if she were any get some response from primary systemic chemotherapy and Herceptin. We will plan on doing this. I discussed with her port placement today. The risk of this being been not limited to bleeding, infection, pneumothorax. We discussed would make a decision on her final surgery which likely be a lumpectomy and a sentinel lymph node biopsy at the completion of this therapy.       Nyoka Alcoser 02/15/2012, 1:15 PM    

## 2012-02-22 NOTE — Transfer of Care (Signed)
Immediate Anesthesia Transfer of Care Note  Patient: Maria Mckinney  Procedure(s) Performed: Procedure(s) (LRB) with comments: INSERTION PORT-A-CATH (Right)  Patient Location: PACU  Anesthesia Type: General  Level of Consciousness: awake, alert , oriented and patient cooperative  Airway & Oxygen Therapy: Patient Spontanous Breathing and Patient connected to face mask oxygen  Post-op Assessment: Report given to PACU RN and Post -op Vital signs reviewed and stable  Post vital signs: Reviewed and stable  Complications: No apparent anesthesia complications

## 2012-02-23 ENCOUNTER — Telehealth (INDEPENDENT_AMBULATORY_CARE_PROVIDER_SITE_OTHER): Payer: Self-pay | Admitting: General Surgery

## 2012-02-23 ENCOUNTER — Telehealth (INDEPENDENT_AMBULATORY_CARE_PROVIDER_SITE_OTHER): Payer: Self-pay

## 2012-02-23 ENCOUNTER — Encounter (HOSPITAL_BASED_OUTPATIENT_CLINIC_OR_DEPARTMENT_OTHER): Payer: Self-pay | Admitting: General Surgery

## 2012-02-23 NOTE — Telephone Encounter (Signed)
Message copied by Littie Deeds on Thu Feb 23, 2012 10:55 AM ------      Message from: Marin Shutter      Created: Thu Feb 23, 2012 10:37 AM      Regarding: Dr. Jeananne Rama: [phone]       Pt got a port put in 10/23.  She is wondering when she needs to see Dr. Dwain Sarna. Thx

## 2012-02-23 NOTE — Telephone Encounter (Signed)
LMOM for pt to call to make f/u appt.

## 2012-02-23 NOTE — Telephone Encounter (Signed)
Spoke with patients husband and informed him that his wife's first PO appt to reck her PAC will be on 11/21 at 11:30.

## 2012-02-27 ENCOUNTER — Telehealth: Payer: Self-pay | Admitting: *Deleted

## 2012-02-27 NOTE — Telephone Encounter (Signed)
Spoke to pt concerning [facility] from 02/15/12.  Pt denies questions or concerns regarding dx or treatment care plan.  Discussed how first chemo day itinerary.  Discussed procedures.  Confirmed f/u with Dr. Welton Flakes on 10/29.  Pt denies further needs.  Encourage pt to call with needs.  Received verbal understanding.  Contact information given.

## 2012-02-27 NOTE — Telephone Encounter (Signed)
Left msg for pt to return call regarding [facility] from 02/15/12.

## 2012-02-28 ENCOUNTER — Telehealth: Payer: Self-pay | Admitting: *Deleted

## 2012-02-28 ENCOUNTER — Ambulatory Visit (HOSPITAL_BASED_OUTPATIENT_CLINIC_OR_DEPARTMENT_OTHER): Payer: BC Managed Care – PPO | Admitting: Oncology

## 2012-02-28 ENCOUNTER — Other Ambulatory Visit (HOSPITAL_BASED_OUTPATIENT_CLINIC_OR_DEPARTMENT_OTHER): Payer: BC Managed Care – PPO | Admitting: Lab

## 2012-02-28 ENCOUNTER — Encounter: Payer: Self-pay | Admitting: Oncology

## 2012-02-28 ENCOUNTER — Other Ambulatory Visit: Payer: BC Managed Care – PPO | Admitting: Lab

## 2012-02-28 VITALS — BP 145/70 | HR 77 | Temp 98.3°F | Resp 20 | Ht 61.0 in | Wt 206.9 lb

## 2012-02-28 DIAGNOSIS — Z171 Estrogen receptor negative status [ER-]: Secondary | ICD-10-CM

## 2012-02-28 DIAGNOSIS — D72819 Decreased white blood cell count, unspecified: Secondary | ICD-10-CM

## 2012-02-28 DIAGNOSIS — C50419 Malignant neoplasm of upper-outer quadrant of unspecified female breast: Secondary | ICD-10-CM

## 2012-02-28 DIAGNOSIS — D696 Thrombocytopenia, unspecified: Secondary | ICD-10-CM

## 2012-02-28 LAB — COMPREHENSIVE METABOLIC PANEL (CC13)
BUN: 13 mg/dL (ref 7.0–26.0)
CO2: 30 mEq/L — ABNORMAL HIGH (ref 22–29)
Calcium: 10.1 mg/dL (ref 8.4–10.4)
Chloride: 104 mEq/L (ref 98–107)
Creatinine: 1 mg/dL (ref 0.6–1.1)
Glucose: 116 mg/dl — ABNORMAL HIGH (ref 70–99)

## 2012-02-28 LAB — CBC WITH DIFFERENTIAL/PLATELET
Basophils Absolute: 0 10*3/uL (ref 0.0–0.1)
HCT: 36.1 % (ref 34.8–46.6)
HGB: 12.6 g/dL (ref 11.6–15.9)
MONO#: 0.4 10*3/uL (ref 0.1–0.9)
NEUT%: 40.7 % (ref 38.4–76.8)
WBC: 3.6 10*3/uL — ABNORMAL LOW (ref 3.9–10.3)
lymph#: 1.5 10*3/uL (ref 0.9–3.3)

## 2012-02-28 NOTE — Patient Instructions (Addendum)
Proceed with chemotherapy on 10/30  On 10/31 you will receive neulasta injection  Prior to the neulasta injection please take tylenol 500 mg and claritin to help prevent aches and pains from the injection. You can continue both for 3 days after the injection then stop  Take your nausea medicines as prescribed

## 2012-02-28 NOTE — Telephone Encounter (Signed)
Gave patient appointment for every week with the np  Sent michelle email to set up treatment

## 2012-02-28 NOTE — Progress Notes (Signed)
OFFICE PROGRESS NOTE  CC  [name],[name], MD Dr. [name] [address] [place] [address] Dr. [name] Dr. [name] Dr. 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 There was an additional 4 cm area of normal signal. Patient did not get an MRI secondary to cochlear implant. Patient desired breast conservation.  #2 patient is now status post Port-A-Cath placement for neoadjuvant chemotherapy consisting of Taxotere carboplatinum and Herceptin. She will begin cycle #1 on 02/29/2012. She will receive Taxotere and carboplatinum every 21 days with Herceptin  given weekly. She will receive a total of 4 cycles of Taxotere and carboplatinum  CURRENT THERAPY: Cycle 1 of TCH starting 02/29/2012  INTERVAL HISTORY: Maria Mckinney 61 y.o. female female returns for followup visit. She had a Port-A-Cath placed site looks good. Patient has had her echocardiogram as well as chemotherapy teaching class. She has all of her anti-emetics. Today she feels well and she is ready to get started on her chemotherapy tomorrow. She denies any nausea vomiting fevers chills night sweats. She has no pain. No myalgias and arthralgias no bleeding no chest pains no palpitations. No easy bruising. Remainder of the 10 point review of systems is negative.  MEDICAL HISTORY: Past Medical History  Diagnosis Date  . HTN (hypertension)   . Fibromyalgia   . GERD (gastroesophageal reflux disease)   . Arthritis   . Anxiety   . Dysrhythmia     irreg hr  . Wears  hearing aid     left ear  . 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 PONV (postoperative nausea and vomiting)     ALLERGIES:   has no known allergies.  MEDICATIONS:  Current Outpatient Prescriptions  Medication Sig Dispense Refill  . bisoprolol-hydrochlorothiazide (ZIAC) 5-6.25 MG per tablet Take 1 tablet by mouth daily.      . cyclobenzaprine (FLEXERIL) 10 MG tablet Take 10 mg by mouth 3 (three) times daily as needed.      Marland Kitchen dexamethasone (DECADRON) 4 MG tablet Take 2 tablets (8 mg total) by mouth 2 (two) times daily with a meal. Take two times a day the day before Taxotere. Then take two times a day starting the day after chemo for 3 days.  30 tablet  1  . DULoxetine (CYMBALTA) 60 MG capsule Take 60 mg by mouth daily.      Marland Kitchen HYDROcodone-acetaminophen (VICODIN) 5-500 MG per tablet Take 1 tablet by mouth every 6 (six) hours as needed.      Marland Kitchen levothyroxine (SYNTHROID, LEVOTHROID) 100 MCG tablet Take 100 mcg by mouth daily.      Marland Kitchen lidocaine-prilocaine (EMLA) cream Apply topically as needed.  30 g  4  . LORazepam (ATIVAN) 0.5 MG tablet Take 1 tablet (0.5 mg total) by mouth every 6 (six) hours as needed (Nausea or vomiting).  30 tablet  0  . meloxicam (MOBIC) 15 MG tablet Take 15 mg by mouth daily.      . mometasone (NASONEX) 50 MCG/ACT nasal spray Place 2 sprays into the nose daily.      Marland Kitchen  ondansetron (ZOFRAN) 8 MG tablet Take 1 tablet (8 mg total) by mouth 2 (two) times daily. Take two times a day starting the day after chemo for 3 days. Then take two times a day as needed for nausea or vomiting.  30 tablet  1  . oxyCODONE-acetaminophen (ROXICET) 5-325 MG per tablet Take 1 tablet by mouth every 4 (four) hours as needed for pain.  10 tablet  0  . prochlorperazine (COMPAZINE) 10 MG tablet Take 1 tablet (10 mg total) by mouth every 6 (six) hours as needed (Nausea or vomiting).  30 tablet  1  . prochlorperazine (COMPAZINE) 25 MG suppository Place 1 suppository (25 mg total) rectally every 12  (twelve) hours as needed for nausea.  12 suppository  3  . ranitidine (ZANTAC) 300 MG tablet Take 300 mg by mouth at bedtime.        SURGICAL HISTORY:  Past Surgical History  Procedure Date  . Appendectomy   . Combined hysteroscopy diagnostic / d&c   . Cochlear implant   . Portacath placement 02/22/2012    Procedure: INSERTION PORT-A-CATH;  Surgeon: [name], MD;  Location: [facility];  Service: General;  Laterality: Right;    REVIEW OF SYSTEMS:  Pertinent items are noted in HPI.   HEALTH MAINTENANCE:  Mammogram Colonoscopy Bone  Scan Pap Smear Eye Exam Vitamin D Lipid Panel  PHYSICAL EXAMINATION: Blood pressure 145/70, pulse 77, temperature 98.3 F (36.8 C), temperature source Oral, resp. rate 20, height 5\' 1"  (1.549 m), weight 206 lb 14.4 oz (93.849 kg). Body mass index is 39.09 kg/(m^2). ECOG PERFORMANCE STATUS: 0 - Asymptomatic   Well-developed well-nourished female in no acute distress HEENT exam EOMI PERRLA sclerae anicteric no conjunctival pallor oral mucosa is moist she is noted to have a white patch in the anterior portion of the left side of the tongue. Neck is supple no palpable adenopathy lungs are clear bilaterally cardiovascular regular rate rhythm abdomen is soft nontender no HSM extremities no edema neuro is nonfocal Port-A-Cath site is nontender healing well left breast no masses or nipple discharge no inversion of the nipple or skin changes.   LABORATORY DATA: Lab Results  Component Value Date   WBC 3.6* 02/28/2012   HGB 12.6 02/28/2012   HCT 36.1 02/28/2012   MCV 89.0 02/28/2012   PLT 134* 02/28/2012      Chemistry      Component Value Date/Time   NA 138 02/15/2012 0825   K 3.5 02/15/2012 0825   CL 102 02/15/2012 0825   CO2 26 02/15/2012 0825   BUN 9.0 02/15/2012 0825   CREATININE 1.1 02/15/2012 0825      Component Value Date/Time   CALCIUM 10.1 02/15/2012 0825   ALKPHOS 89 02/15/2012 0825   AST 46* 02/15/2012 0825     ALT 61* 02/15/2012 0825   BILITOT 0.50 02/15/2012 0825       RADIOGRAPHIC STUDIES:  Dg Chest Port 1 View  02/22/2012  *RADIOLOGY REPORT*  Clinical Data: Status post Port-A-Cath placement.  PORTABLE CHEST - 1 VIEW  Comparison: Chest x-ray 09/14/2006.  Findings: There is a right subclavian single lumen Port-A-Cath with tip terminating in the distal right innominate vein near the confluence with the left innominate vein, immediately proximal to the origin of the superior vena cava.  Film is slightly under penetrated.  With this limitation in mind, there is no definite pneumothorax identified.  Lungs appear clear.  No definite pleural effusions.  No evidence of pulmonary edema.  Heart size is within normal limits.  Mediastinal contours are unremarkable.  IMPRESSION: 1.  Status post Port-A-Cath placement, as above. 2.  No pneumothorax or other acute complicating features.   Original Report Authenticated By: Florencia Reasons, M.D.    Dg Fluoro Guide Cv Line-no Report  02/22/2012  CLINICAL DATA: port-a-cath placement   FLOURO GUIDE CV LINE  Fluoroscopy was utilized by the requesting physician.  No radiographic  interpretation.      ASSESSMENT: 61 year old female with  #1 stage I HER-2 positive left breast cancer ER negative PR negative. Patient is being considered for neoadjuvant chemotherapy consisting of Taxotere carboplatinum and Herceptin as she does want breast conservation. She understands the risks and benefits of this approach she understands the side effects of her chemotherapy as well as Herceptin.  #2 she understands she will receive Neulasta day after her chemotherapy to keep her white count up.  #3 patient does have mild thrombocytopenia as well as leukopenia we will continue to monitor this.   PLAN:   #1 patient will proceed with cycle 1 day 1 of TCH on 02/29/2012. She will return on day 2 which will be on 10/31 for Neulasta.  #3 she will be seen on a weekly basis until she  completes her chemotherapy.   All questions were answered. The patient knows to call the clinic with any problems, questions or concerns. We can certainly see the patient much sooner if necessary.  I spent 25 minutes counseling the patient face to face. The total time spent in the appointment was 30 minutes.    Drue Second, MD Medical/Oncology [facility] [phone] (beeper) [phone] (Office)  02/28/2012, 10:23 AM

## 2012-02-28 NOTE — Telephone Encounter (Signed)
Per staff message and POF I have scheduled appts.  JMW  

## 2012-02-29 ENCOUNTER — Ambulatory Visit (HOSPITAL_BASED_OUTPATIENT_CLINIC_OR_DEPARTMENT_OTHER): Payer: BC Managed Care – PPO

## 2012-02-29 ENCOUNTER — Other Ambulatory Visit: Payer: Self-pay | Admitting: *Deleted

## 2012-02-29 ENCOUNTER — Telehealth (INDEPENDENT_AMBULATORY_CARE_PROVIDER_SITE_OTHER): Payer: Self-pay | Admitting: General Surgery

## 2012-02-29 DIAGNOSIS — Z5111 Encounter for antineoplastic chemotherapy: Secondary | ICD-10-CM

## 2012-02-29 DIAGNOSIS — Z5112 Encounter for antineoplastic immunotherapy: Secondary | ICD-10-CM

## 2012-02-29 DIAGNOSIS — C50419 Malignant neoplasm of upper-outer quadrant of unspecified female breast: Secondary | ICD-10-CM

## 2012-02-29 MED ORDER — DIPHENHYDRAMINE HCL 25 MG PO CAPS
50.0000 mg | ORAL_CAPSULE | Freq: Once | ORAL | Status: AC
  Administered 2012-02-29: 50 mg via ORAL

## 2012-02-29 MED ORDER — UNABLE TO FIND: Status: DC

## 2012-02-29 MED ORDER — ONDANSETRON 16 MG/50ML IVPB (CHCC)
16.0000 mg | Freq: Once | INTRAVENOUS | Status: AC
  Administered 2012-02-29: 16 mg via INTRAVENOUS

## 2012-02-29 MED ORDER — TRASTUZUMAB CHEMO INJECTION 440 MG
4.0000 mg/kg | Freq: Once | INTRAVENOUS | Status: AC
  Administered 2012-02-29: 378 mg via INTRAVENOUS
  Filled 2012-02-29: qty 18

## 2012-02-29 MED ORDER — SODIUM CHLORIDE 0.9 % IV SOLN
616.2000 mg | Freq: Once | INTRAVENOUS | Status: AC
  Administered 2012-02-29: 620 mg via INTRAVENOUS
  Filled 2012-02-29: qty 62

## 2012-02-29 MED ORDER — SODIUM CHLORIDE 0.9 % IV SOLN
Freq: Once | INTRAVENOUS | Status: AC
  Administered 2012-02-29: 10:00:00 via INTRAVENOUS

## 2012-02-29 MED ORDER — DOCETAXEL CHEMO INJECTION 160 MG/16ML
75.0000 mg/m2 | Freq: Once | INTRAVENOUS | Status: AC
  Administered 2012-02-29: 150 mg via INTRAVENOUS
  Filled 2012-02-29: qty 15

## 2012-02-29 MED ORDER — DEXAMETHASONE SODIUM PHOSPHATE 4 MG/ML IJ SOLN
20.0000 mg | Freq: Once | INTRAMUSCULAR | Status: AC
  Administered 2012-02-29: 20 mg via INTRAVENOUS

## 2012-02-29 MED ORDER — ACETAMINOPHEN 325 MG PO TABS
650.0000 mg | ORAL_TABLET | Freq: Once | ORAL | Status: AC
  Administered 2012-02-29: 650 mg via ORAL

## 2012-02-29 NOTE — Patient Instructions (Signed)
[facility] Discharge Instructions for Patients Receiving Chemotherapy  Today you received the following chemotherapy agents: Taxotere, Carboplatin and Herceptin.  To help prevent nausea and vomiting after your treatment, we encourage you to take your nausea medication: Zofran and Decadron. Begin taking it the morning of 03/01/12 and take it as often as prescribed for the next 72 hours. Take Lorazepam (Ativan) for nausea not relieved by the above medications. Will make you drowsy.   If you develop nausea and vomiting that is not controlled by your nausea medication, call the clinic. If it is after clinic hours your family physician or the after hours number for the clinic or go to the Emergency Department.   BELOW ARE SYMPTOMS THAT SHOULD BE REPORTED IMMEDIATELY:  *FEVER GREATER THAN 100.5 F  *CHILLS WITH OR WITHOUT FEVER  NAUSEA AND VOMITING THAT IS NOT CONTROLLED WITH YOUR NAUSEA MEDICATION  *UNUSUAL SHORTNESS OF BREATH  *UNUSUAL BRUISING OR BLEEDING  TENDERNESS IN MOUTH AND THROAT WITH OR WITHOUT PRESENCE OF ULCERS  *URINARY PROBLEMS  *BOWEL PROBLEMS  UNUSUAL RASH Items with * indicate a potential emergency and should be followed up as soon as possible.  One of the nurses will contact you 24 hours after your treatment. Please let the nurse know about any problems that you may have experienced. Feel free to call the clinic you have any questions or concerns. The clinic phone number is [phone].   I have been informed and understand all the instructions given to me. I know to contact the clinic, my physician, or go to the Emergency Department if any problems should occur. I do not have any questions at this time, but understand that I may call the clinic during office hours   should I have any questions or need assistance in obtaining follow up care.    __________________________________________  _____________  __________ Signature of Patient or Authorized  Representative            Date                   Time    __________________________________________ Nurse's Signature

## 2012-02-29 NOTE — Progress Notes (Signed)
Chest Xray report indicates port a cath tip terminates in innominate vein. Clarified with Dr. Llana Aliment in radiology. Reviewed with Dr. Welton Flakes: order received to administer chemotherapy via peripheral vein today and have pt follow up with Dr. Dwain Sarna. Pt and husband agree with plan.

## 2012-02-29 NOTE — Progress Notes (Signed)
Drs. Welton Flakes and Dwain Sarna discussed pt's port a cath placement. Per Dr. Dwain Sarna when catheter tip was advanced during placement it did not function. Advancing tip further into SVC caused arrhythmias during procedure. See op note. Per Dr. Welton Flakes, will use port a cath for subsequent treatments as long as it functions properly. Pt and husband made aware.

## 2012-02-29 NOTE — Telephone Encounter (Signed)
Maria Mckinney, at the [facility], calling to let Dr. Dwain Sarna know they cannot use the pt's PAC, as it is in the innominate vein and may be kinked as well.  Today's chemo will be given peripherally.  Sent message with this update to Dr. Dwain Sarna.

## 2012-03-01 ENCOUNTER — Ambulatory Visit (HOSPITAL_BASED_OUTPATIENT_CLINIC_OR_DEPARTMENT_OTHER): Payer: BC Managed Care – PPO

## 2012-03-01 ENCOUNTER — Telehealth: Payer: Self-pay | Admitting: *Deleted

## 2012-03-01 VITALS — BP 138/71 | HR 79 | Temp 98.2°F

## 2012-03-01 DIAGNOSIS — Z5189 Encounter for other specified aftercare: Secondary | ICD-10-CM

## 2012-03-01 DIAGNOSIS — C50419 Malignant neoplasm of upper-outer quadrant of unspecified female breast: Secondary | ICD-10-CM

## 2012-03-01 MED ORDER — PEGFILGRASTIM INJECTION 6 MG/0.6ML
6.0000 mg | Freq: Once | SUBCUTANEOUS | Status: AC
  Administered 2012-03-01: 6 mg via SUBCUTANEOUS
  Filled 2012-03-01: qty 0.6

## 2012-03-01 NOTE — Telephone Encounter (Signed)
Maria Mckinney here for Neulasta injection following 1st tch treatment.  States no nausea, vomiting, or diarrhea.  Is drinking lots of water and eating without difficulty  Patient knows to call if she has any problems, concerns or questions.

## 2012-03-06 ENCOUNTER — Other Ambulatory Visit: Payer: BC Managed Care – PPO | Admitting: Lab

## 2012-03-06 ENCOUNTER — Ambulatory Visit (HOSPITAL_BASED_OUTPATIENT_CLINIC_OR_DEPARTMENT_OTHER): Payer: BC Managed Care – PPO

## 2012-03-06 ENCOUNTER — Ambulatory Visit (HOSPITAL_BASED_OUTPATIENT_CLINIC_OR_DEPARTMENT_OTHER): Payer: BC Managed Care – PPO | Admitting: Adult Health

## 2012-03-06 VITALS — BP 145/70 | HR 78 | Temp 98.6°F | Resp 20 | Ht 61.0 in | Wt 201.1 lb

## 2012-03-06 DIAGNOSIS — D702 Other drug-induced agranulocytosis: Secondary | ICD-10-CM

## 2012-03-06 DIAGNOSIS — D709 Neutropenia, unspecified: Secondary | ICD-10-CM

## 2012-03-06 DIAGNOSIS — E876 Hypokalemia: Secondary | ICD-10-CM

## 2012-03-06 DIAGNOSIS — K121 Other forms of stomatitis: Secondary | ICD-10-CM

## 2012-03-06 DIAGNOSIS — K1230 Oral mucositis (ulcerative), unspecified: Secondary | ICD-10-CM

## 2012-03-06 DIAGNOSIS — E860 Dehydration: Secondary | ICD-10-CM

## 2012-03-06 DIAGNOSIS — C50419 Malignant neoplasm of upper-outer quadrant of unspecified female breast: Secondary | ICD-10-CM

## 2012-03-06 LAB — CBC WITH DIFFERENTIAL/PLATELET
Basophils Absolute: 0 10*3/uL (ref 0.0–0.1)
EOS%: 4 % (ref 0.0–7.0)
HCT: 35.7 % (ref 34.8–46.6)
HGB: 12.4 g/dL (ref 11.6–15.9)
MCH: 30.3 pg (ref 25.1–34.0)
MCV: 87.3 fL (ref 79.5–101.0)
MONO%: 26.9 % — ABNORMAL HIGH (ref 0.0–14.0)
NEUT%: 19.3 % — ABNORMAL LOW (ref 38.4–76.8)

## 2012-03-06 LAB — COMPREHENSIVE METABOLIC PANEL (CC13)
AST: 40 U/L — ABNORMAL HIGH (ref 5–34)
Alkaline Phosphatase: 86 U/L (ref 40–150)
BUN: 15 mg/dL (ref 7.0–26.0)
Creatinine: 1.1 mg/dL (ref 0.6–1.1)
Total Bilirubin: 1.19 mg/dL (ref 0.20–1.20)

## 2012-03-06 MED ORDER — SODIUM CHLORIDE 0.9 % IV SOLN
Freq: Once | INTRAVENOUS | Status: AC
  Administered 2012-03-06: 15:00:00 via INTRAVENOUS

## 2012-03-06 MED ORDER — POTASSIUM CHLORIDE ER 10 MEQ PO TBCR: 20.0000 meq | EXTENDED_RELEASE_TABLET | Freq: Two times a day (BID) | ORAL | Status: DC

## 2012-03-06 MED ORDER — MAGIC MOUTHWASH W/LIDOCAINE: 5.0000 mL | Freq: Four times a day (QID) | ORAL | Status: DC

## 2012-03-06 MED ORDER — HYDROCODONE-ACETAMINOPHEN 5-500 MG PO TABS: 1.0000 | ORAL_TABLET | Freq: Four times a day (QID) | ORAL | Status: DC | PRN

## 2012-03-06 MED ORDER — CIPROFLOXACIN HCL 500 MG PO TABS: 500.0000 mg | ORAL_TABLET | Freq: Two times a day (BID) | ORAL | Status: DC

## 2012-03-06 NOTE — Patient Instructions (Addendum)
  Patient Neutropenia Instruction Sheet  Diagnosis: Breast Cancer      Treating Physician: Drue Second, MD  Treatment: 1. Type of chemotherapy: Taxotere/Carboplatin/Herceptin 2. Date of last treatment: 02/29/12  Last Blood Counts: Lab Results  Component Value Date   WBC 0.9* 03/06/2012   HGB 12.4 03/06/2012   HCT 35.7 03/06/2012   MCV 87.3 03/06/2012   PLT 84* 03/06/2012   ANC 200      Prophylactic Antibiotics: Cipro 500 mg by mouth twice a day Instructions: 1. Monitor temperature and call if fever  greater than 100.5, chills, shaking chills (rigors) 2. Call Physician on-call at [phone] 3. Give him/her symptoms and list of medications that you are taking and your last blood count.   Continue to take tylenol as needed for the bony pain.  Do not take more than 8 tablets in one 24 hour period.    I have sent a prescription for Magic Mouthwash in, please swish with this four times a day.    Please call us if you have any questions or concerns.

## 2012-03-07 ENCOUNTER — Ambulatory Visit (HOSPITAL_BASED_OUTPATIENT_CLINIC_OR_DEPARTMENT_OTHER): Payer: BC Managed Care – PPO

## 2012-03-07 ENCOUNTER — Other Ambulatory Visit: Payer: BC Managed Care – PPO | Admitting: Lab

## 2012-03-07 ENCOUNTER — Ambulatory Visit: Payer: BC Managed Care – PPO | Admitting: Adult Health

## 2012-03-07 VITALS — BP 142/56 | HR 75 | Temp 98.2°F | Resp 20

## 2012-03-07 DIAGNOSIS — Z5112 Encounter for antineoplastic immunotherapy: Secondary | ICD-10-CM

## 2012-03-07 DIAGNOSIS — K1230 Oral mucositis (ulcerative), unspecified: Secondary | ICD-10-CM

## 2012-03-07 DIAGNOSIS — C50419 Malignant neoplasm of upper-outer quadrant of unspecified female breast: Secondary | ICD-10-CM

## 2012-03-07 MED ORDER — SODIUM CHLORIDE 0.9 % IV SOLN
Freq: Once | INTRAVENOUS | Status: AC
  Administered 2012-03-07: 09:00:00 via INTRAVENOUS

## 2012-03-07 MED ORDER — TRASTUZUMAB CHEMO INJECTION 440 MG
2.0000 mg/kg | Freq: Once | INTRAVENOUS | Status: AC
  Administered 2012-03-07: 189 mg via INTRAVENOUS
  Filled 2012-03-07: qty 9

## 2012-03-07 MED ORDER — MAGIC MOUTHWASH W/LIDOCAINE: 5.0000 mL | Freq: Four times a day (QID) | ORAL | Status: DC

## 2012-03-07 MED ORDER — ACETAMINOPHEN 325 MG PO TABS
650.0000 mg | ORAL_TABLET | Freq: Once | ORAL | Status: AC
  Administered 2012-03-07: 650 mg via ORAL

## 2012-03-07 MED ORDER — SODIUM CHLORIDE 0.9 % IJ SOLN
10.0000 mL | INTRAMUSCULAR | Status: DC | PRN
  Administered 2012-03-07: 10 mL
  Filled 2012-03-07: qty 10

## 2012-03-07 MED ORDER — DIPHENHYDRAMINE HCL 25 MG PO CAPS
50.0000 mg | ORAL_CAPSULE | Freq: Once | ORAL | Status: AC
  Administered 2012-03-07: 50 mg via ORAL

## 2012-03-07 MED ORDER — HEPARIN SOD (PORK) LOCK FLUSH 100 UNIT/ML IV SOLN
500.0000 [IU] | Freq: Once | INTRAVENOUS | Status: AC | PRN
  Administered 2012-03-07: 500 [IU]
  Filled 2012-03-07: qty 5

## 2012-03-07 NOTE — Patient Instructions (Addendum)

## 2012-03-08 ENCOUNTER — Encounter: Payer: Self-pay | Admitting: Adult Health

## 2012-03-08 NOTE — Progress Notes (Signed)
Maria Mckinney [id number] [birth year] 61 y.o. 03/08/2012 10:49 AM  CC  Maria Ravel, MD Dr. [name] [address] [place] [address] Dr. [name] Dr. [name] Dr. 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  PRIOR THERAPY:  #1Patient was originally seen in the multidisciplinary breast clinic for new diagnosis of left breast cancer that was 8mm in the upper outer quadrant of the left breast.  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  There was an additional 4 cm area of normal signal.  Patient did not get an MRI secondary to cochlear implant.  She desires breast conservation.    #2 Patient is s/p port-a-cath placement for neoadjuvant chemotherapy consisting of Taxotere/carboplatinum/Herceptin.  She will begin cycle #1 on 02/29/2012.  She will receive Taxotere and carboplatinum every 21 days with Herceptin giving weekly.  She will receive a total of 4 cycles of Taxotere and Carboplatinum.    CURRENT THERAPY: C1 Day 7 of TCH (weekly Herceptin).  INTERVAL HISTORY: Maria Mckinney is feeling puny today.  She came in early because she is having generalized pain and her mouth is sore.  She has not had any fevers or chills.  She has not been eating and drinking enough as well.  This started after she received the Neulasta injection.  She didn't take the Claritin and Tylenol like she was supposed to.     Past Medical History: Past Medical History  Diagnosis Date  . HTN (hypertension)   . Fibromyalgia   . GERD (gastroesophageal reflux disease)   . Arthritis   . Anxiety   . Dysrhythmia     irreg hr  . Wears hearing aid     left ear  . 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 PONV (postoperative nausea and vomiting)     Past Surgical History: Past Surgical History  Procedure  Date  . Appendectomy   . Combined hysteroscopy diagnostic / d&c   . Cochlear implant   . Portacath placement 02/22/2012    Procedure: INSERTION PORT-A-CATH;  Surgeon: [name], MD;  Location: [facility];  Service: General;  Laterality: Right;    Family History: Family History  Problem Relation Age of Onset  . Cancer Mother     "tumor of face removed"  . Cancer Father     kidney removed  . Cancer Maternal Aunt     stomach & Pancreatic  . Cancer Paternal Uncle     pancreatic    Social History History  Substance Use Topics  . Smoking status: Never Smoker   . Smokeless tobacco: Not on file  . Alcohol Use: 0.0 oz/week    0-1 Glasses of wine per week    Allergies: No Known Allergies  Current Medications: Current Outpatient Prescriptions  Medication Sig Dispense Refill  . bisoprolol-hydrochlorothiazide (ZIAC) 5-6.25 MG per tablet Take 1 tablet by mouth daily.      . ciprofloxacin (CIPRO) 500 MG tablet Take 1 tablet (500 mg total) by mouth 2 (two) times daily.  14 tablet  6  . cyclobenzaprine (FLEXERIL) 10 MG tablet Take 10 mg by mouth 3 (three) times daily as needed.      Marland Kitchen dexamethasone (DECADRON) 4 MG tablet Take 2 tablets (8 mg total) by mouth 2 (two) times daily with a meal. Take two times a day  the day before Taxotere. Then take two times a day starting the day after chemo for 3 days.  30 tablet  1  . DULoxetine (CYMBALTA) 60 MG capsule Take 60 mg by mouth daily.      Marland Kitchen HYDROcodone-acetaminophen (VICODIN) 5-500 MG per tablet Take 1 tablet by mouth every 6 (six) hours as needed.  60 tablet  0  . levothyroxine (SYNTHROID, LEVOTHROID) 100 MCG tablet Take 100 mcg by mouth daily.      Marland Kitchen lidocaine-prilocaine (EMLA) cream Apply topically as needed.  30 g  4  . LORazepam (ATIVAN) 0.5 MG tablet Take 1 tablet (0.5 mg total) by mouth every 6 (six) hours as needed (Nausea or vomiting).  30 tablet  0  . meloxicam (MOBIC) 15 MG tablet Take 15 mg by mouth daily.       . mometasone (NASONEX) 50 MCG/ACT nasal spray Place 2 sprays into the nose daily.      . ondansetron (ZOFRAN) 8 MG tablet Take 1 tablet (8 mg total) by mouth 2 (two) times daily. Take two times a day starting the day after chemo for 3 days. Then take two times a day as needed for nausea or vomiting.  30 tablet  1  . prochlorperazine (COMPAZINE) 10 MG tablet Take 1 tablet (10 mg total) by mouth every 6 (six) hours as needed (Nausea or vomiting).  30 tablet  1  . prochlorperazine (COMPAZINE) 25 MG suppository Place 1 suppository (25 mg total) rectally every 12 (twelve) hours as needed for nausea.  12 suppository  3  . ranitidine (ZANTAC) 300 MG tablet Take 300 mg by mouth at bedtime.      Marland Kitchen UNABLE TO FIND Cranial Prothesis  1 each  0  . Alum & Mag Hydroxide-Simeth (MAGIC MOUTHWASH W/LIDOCAINE) SOLN Take 5 mLs by mouth 4 (four) times daily.  200 mL  6  . oxyCODONE-acetaminophen (ROXICET) 5-325 MG per tablet Take 1 tablet by mouth every 4 (four) hours as needed for pain.  10 tablet  0  . potassium chloride (K-DUR) 10 MEQ tablet Take 2 tablets (20 mEq total) by mouth 2 (two) times daily.  8 tablet  0   No current facility-administered medications for this visit.   Facility-Administered Medications Ordered in Other Visits  Medication Dose Route Frequency Provider Last Rate Last Dose  . [DISCONTINUED] sodium chloride 0.9 % injection 10 mL  10 mL Intracatheter PRN Victorino December, MD   10 mL at 03/07/12 1040    Health Maintenance:  ColonoscopyShe has never had a colonoscopy Bone DensityBone density about 10-12 years ago Last PAP smearLast Pap smear was in 2012   REVIEW OF SYSTEMS: General: fatigue (-), night sweats (-), fever (-), pain (-) Lymph: palpable nodes (-) HEENT: vision changes (-), mucositis (-), gum bleeding (-), epistaxis (-) Cardiovascular: chest pain (-), palpitations (-) Pulmonary: shortness of breath (-), dyspnea on exertion (-), cough (-), hemoptysis (-) GI:  Early satiety  (-), melena (-), dysphagia (-), nausea/vomiting (-), diarrhea (-) GU: dysuria (-), hematuria (-), incontinence (-) Musculoskeletal: joint swelling (-), joint pain (-), back pain (-) Neuro: weakness (-), numbness (-), headache (-), confusion (-) Skin: Rash (-), lesions (-), dryness (-) Psych: depression (-), suicidal/homicidal ideation (-), feeling of hopelessness (-)   PHYSICAL EXAMINATION: Blood pressure 145/70, pulse 78, temperature 98.6 F (37 C), resp. rate 20, height 5\' 1"  (1.549 m), weight 201 lb 1.6 oz (91.218 kg). General: Patient is a well appearing female in no acute distress HEENT: PERRLA,  sclerae anicteric no conjunctival pallor, MMM Neck: supple, no palpable adenopathy Lungs: clear to auscultation bilaterally, no wheezes, rhonchi, or rales Cardiovascular: regular rate rhythm, S1, S2, no murmurs, rubs or gallops Abdomen: Soft, non-tender, non-distended, normoactive bowel sounds, no HSM Extremities: warm and well perfused, no clubbing, cyanosis, or edema Skin: No rashes or lesions Neuro: Non-focal ECOG PERFORMANCE STATUS: 1 - Symptomatic but completely ambulatory  STUDIES/RESULTS: No results found.   LABS:    Chemistry      Component Value Date/Time   NA 128* 03/06/2012 1040   K 2.9* 03/06/2012 1040   CL 90* 03/06/2012 1040   CO2 29 03/06/2012 1040   BUN 15.0 03/06/2012 1040   CREATININE 1.1 03/06/2012 1040      Component Value Date/Time   CALCIUM 9.3 03/06/2012 1040   ALKPHOS 86 03/06/2012 1040   AST 40* 03/06/2012 1040   ALT 96* 03/06/2012 1040   BILITOT 1.19 03/06/2012 1040      Lab Results  Component Value Date   WBC 0.9* 03/06/2012   HGB 12.4 03/06/2012   HCT 35.7 03/06/2012   MCV 87.3 03/06/2012   PLT 84* 03/06/2012       PATHOLOGY:  ADDITIONAL INFORMATION: PROGNOSTIC INDICATORS - ACIS Results IMMUNOHISTOCHEMICAL AND MORPHOMETRIC ANALYSIS BY THE AUTOMATED CELLULAR IMAGING SYSTEM (ACIS) Estrogen Receptor (Negative, <1%): 0%, NEGATIVE Progesterone  Receptor (Negative, <1%): 0%, NEGATIVE Proliferation Marker Ki67 by M IB-1 (Low<20%): 98% All controls stained appropriately Pecola Leisure MD Pathologist, Electronic Signature ( Signed 02/15/2012) CHROMOGENIC IN-SITU HYBRIDIZATION Interpretation: HER2/NEU BY CISH - SHOWS AMPLIFICATION BY CISH ANALYSIS. THE RATIO OF HER2: CEP 17 SIGNALS WAS 2.55. 40 TUMOR CELLS WERE SCORED. OF NOTE, THE TUMOR APPEARS TO SHOW HETEROGENEITY IN REGARDS TO HER2 EXPRESSION. Reference range: Ratio: HER2:CEP17 < 1.8 gene amplification not observed Ratio: HER2:CEP 17 1.8-2.2 - equivocal result Ratio: HER2:CEP17 > 2.2 - gene amplification observed Pecola Leisure MD Pathologist, Electronic Signature ( Signed 02/15/2012) 1 of ADDITIONAL INFORMATION: PROGNOSTIC INDICATORS - ACIS Results IMMUNOHISTOCHEMICAL AND MORPHOMETRIC ANALYSIS BY THE AUTOMATED CELLULAR IMAGING SYSTEM (ACIS) Estrogen Receptor (Negative, <1%): 0%, NEGATIVE Progesterone Receptor (Negative, <1%): 0%, NEGATIVE COMMENT: The negative hormone receptor study(ies) in this case have an internal positive control. All controls stained appropriately Abigail Miyamoto MD Pathologist, Electronic Signature ( Signed 02/22/2012)  ASSESSMENT #1 Stage 1 HER-2 positive left breast cancer ER negative PR negative.  Patient is being considered for neoadjuvant chemotherapy consisting of Taxotere carboplatinum and herceptin as she does want breast conservation.  She understands the risks and benefits of this approach and the side effects of her chemotherapy and Herceptin.  #2 She understands she will receive neulasta the day after chemotherapy to keep her white count up.    #3 She does have thrombocytopenia and we will monitor this  #4 Neutropenia  #5 Mucositis/Dehydration  PLAN:    #1Ms. Mckinney is day 7 cycle 1 of TCH.  She tolerated the chemotherapy relatively well.  She is now neutropenic.  I have given her instructions regarding neutropenic precautions, she  will start on Cipro today.    #2 For her mucositis I called in Magic mouthwash, she will call me if it doesn't improve and I will start her on Acyclovir.  #3  She has not had very good intake, due to her severe pain I refilled her Lorcet with the understanding that her PCP will fill this from here on out.  Also, we will add her on for IV fluids today.      All questions  were answered. The patient knows to call the clinic with any problems, questions or concerns. We can certainly see the patient much sooner if necessary.  I spent 25 minutes counseling the patient face to face. The total time spent in the appointment was 30 minutes.  Cherie Ouch Lyn Hollingshead, NP Medical Oncology [facility] Phone: [phone]  03/08/2012, 10:49 AM

## 2012-03-10 ENCOUNTER — Telehealth: Payer: Self-pay | Admitting: Oncology

## 2012-03-10 NOTE — Telephone Encounter (Signed)
On call: patient was begun on cipro on 03-08-12 due to chemo neutropenia. Since last pm has had red, puritic rash on neck. No fever, otherwise feels ok. Will hold Cipro and can use benadryl for itching. She will call Dr Milta Deiters RN on [facility] 03-12-12 to let them know how she is. She does not recall ever having had cipro before.  Ila Mcgill, MD

## 2012-03-13 ENCOUNTER — Other Ambulatory Visit: Payer: BC Managed Care – PPO | Admitting: Lab

## 2012-03-14 ENCOUNTER — Encounter: Payer: Self-pay | Admitting: Adult Health

## 2012-03-14 ENCOUNTER — Telehealth: Payer: Self-pay | Admitting: Oncology

## 2012-03-14 ENCOUNTER — Other Ambulatory Visit (HOSPITAL_BASED_OUTPATIENT_CLINIC_OR_DEPARTMENT_OTHER): Payer: BC Managed Care – PPO | Admitting: Lab

## 2012-03-14 ENCOUNTER — Ambulatory Visit (HOSPITAL_BASED_OUTPATIENT_CLINIC_OR_DEPARTMENT_OTHER): Payer: BC Managed Care – PPO

## 2012-03-14 ENCOUNTER — Ambulatory Visit (HOSPITAL_BASED_OUTPATIENT_CLINIC_OR_DEPARTMENT_OTHER): Payer: BC Managed Care – PPO | Admitting: Adult Health

## 2012-03-14 ENCOUNTER — Telehealth: Payer: Self-pay | Admitting: *Deleted

## 2012-03-14 VITALS — BP 147/72 | HR 81 | Temp 98.1°F | Resp 20 | Ht 61.0 in | Wt 199.9 lb

## 2012-03-14 DIAGNOSIS — K121 Other forms of stomatitis: Secondary | ICD-10-CM

## 2012-03-14 DIAGNOSIS — C50419 Malignant neoplasm of upper-outer quadrant of unspecified female breast: Secondary | ICD-10-CM

## 2012-03-14 DIAGNOSIS — K1230 Oral mucositis (ulcerative), unspecified: Secondary | ICD-10-CM

## 2012-03-14 DIAGNOSIS — D696 Thrombocytopenia, unspecified: Secondary | ICD-10-CM

## 2012-03-14 DIAGNOSIS — E876 Hypokalemia: Secondary | ICD-10-CM

## 2012-03-14 DIAGNOSIS — Z171 Estrogen receptor negative status [ER-]: Secondary | ICD-10-CM

## 2012-03-14 DIAGNOSIS — Z5112 Encounter for antineoplastic immunotherapy: Secondary | ICD-10-CM

## 2012-03-14 LAB — CBC WITH DIFFERENTIAL/PLATELET
BASO%: 0.3 % (ref 0.0–2.0)
HCT: 33.2 % — ABNORMAL LOW (ref 34.8–46.6)
MCHC: 35.6 g/dL (ref 31.5–36.0)
MONO#: 0.4 10*3/uL (ref 0.1–0.9)
RBC: 3.78 10*6/uL (ref 3.70–5.45)
RDW: 13.9 % (ref 11.2–14.5)
WBC: 9.2 10*3/uL (ref 3.9–10.3)
lymph#: 1.6 10*3/uL (ref 0.9–3.3)

## 2012-03-14 LAB — COMPREHENSIVE METABOLIC PANEL (CC13)
ALT: 40 U/L (ref 0–55)
Alkaline Phosphatase: 88 U/L (ref 40–150)
CO2: 33 mEq/L — ABNORMAL HIGH (ref 22–29)
Creatinine: 1 mg/dL (ref 0.6–1.1)
Glucose: 130 mg/dl — ABNORMAL HIGH (ref 70–99)
Sodium: 135 mEq/L — ABNORMAL LOW (ref 136–145)
Total Bilirubin: 0.46 mg/dL (ref 0.20–1.20)

## 2012-03-14 MED ORDER — HEPARIN SOD (PORK) LOCK FLUSH 100 UNIT/ML IV SOLN
500.0000 [IU] | Freq: Once | INTRAVENOUS | Status: AC | PRN
  Administered 2012-03-14: 500 [IU]
  Filled 2012-03-14: qty 5

## 2012-03-14 MED ORDER — DIPHENHYDRAMINE HCL 25 MG PO CAPS
50.0000 mg | ORAL_CAPSULE | Freq: Once | ORAL | Status: AC
  Administered 2012-03-14: 50 mg via ORAL

## 2012-03-14 MED ORDER — SODIUM CHLORIDE 0.9 % IJ SOLN
10.0000 mL | INTRAMUSCULAR | Status: DC | PRN
  Administered 2012-03-14: 10 mL
  Filled 2012-03-14: qty 10

## 2012-03-14 MED ORDER — ACYCLOVIR 400 MG PO TABS: 400.0000 mg | ORAL_TABLET | Freq: Every day | ORAL | Status: DC

## 2012-03-14 MED ORDER — ACETAMINOPHEN 325 MG PO TABS
650.0000 mg | ORAL_TABLET | Freq: Once | ORAL | Status: AC
  Administered 2012-03-14: 650 mg via ORAL

## 2012-03-14 MED ORDER — SODIUM CHLORIDE 0.9 % IV SOLN
Freq: Once | INTRAVENOUS | Status: AC
  Administered 2012-03-14: 10:00:00 via INTRAVENOUS

## 2012-03-14 MED ORDER — POTASSIUM CHLORIDE CRYS ER 20 MEQ PO TBCR: EXTENDED_RELEASE_TABLET | ORAL | Status: DC

## 2012-03-14 MED ORDER — TRASTUZUMAB CHEMO INJECTION 440 MG
2.0000 mg/kg | Freq: Once | INTRAVENOUS | Status: AC
  Administered 2012-03-14: 189 mg via INTRAVENOUS
  Filled 2012-03-14: qty 9

## 2012-03-14 NOTE — Telephone Encounter (Signed)
Per NP notified pt to begin Kdur 1 PO Daily, disp 30.

## 2012-03-14 NOTE — Patient Instructions (Addendum)
Doing well.  Proceed with Herceptin.  I have called in Acyclovir for your mouth ulcers.  Continue to use magic mouthwash.  We will see you next week for your chemo.

## 2012-03-14 NOTE — Telephone Encounter (Signed)
Sent Maria Mckinney an email regarding the pt needing to have iv fluids added to her already scheduled appts for nov.

## 2012-03-14 NOTE — Telephone Encounter (Signed)
Message copied by Cooper Render on Wed Mar 14, 2012 12:05 PM ------      Message from: Laural Golden      Created: Wed Mar 14, 2012 10:40 AM       Please call in Rainbow Gracie Kdur 40 meq po daily, disp 30            ----- Message -----         From: [facility]         Sent: 03/14/2012   8:47 AM           To: Victorino December, MD

## 2012-03-14 NOTE — Progress Notes (Signed)
Maria Mckinney [id number] 05-Sep-1950 61 y.o. 03/14/2012 9:15 AM  CC  [name], MD Dr. [name] [address] [place] [address] Dr. [name] Dr. [name] Dr. 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  PRIOR THERAPY:  #1Patient was originally seen in the multidisciplinary breast clinic for new diagnosis of left breast cancer that was 8mm in the upper outer quadrant of the left breast.  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  There was an additional 4 cm area of normal signal.  Patient did not get an MRI secondary to cochlear implant.  She desires breast conservation.    #2 Patient is s/p port-a-cath placement for neoadjuvant chemotherapy consisting of Taxotere/carboplatinum/Herceptin.  She will begin cycle #1 on 02/29/2012.  She will receive Taxotere and carboplatinum every 21 days with Herceptin giving weekly.  She will receive a total of 4 cycles of Taxotere and Carboplatinum.    CURRENT THERAPY: C1 Day 15 of TCH (weekly Herceptin).  INTERVAL HISTORY: Maria Mckinney is feeling much better than last week.  The fluids helped perk her up.  She developed a rash on her chest and neck four days after starting the Cipro.  She called the physician on call over the weekend and discontinued the Cipro.  Since then the rash has subsided.  She still has ulcers in her mouth, though she feels it is much improved after starting the magic mouthwash.  She completed her K-Dur without any difficulty.  She is otherwise without any questions/concerns.    Past Medical History: Past Medical History  Diagnosis Date  . HTN (hypertension)   . Fibromyalgia   . GERD (gastroesophageal reflux disease)   . Arthritis   . Anxiety   . Dysrhythmia     irreg hr  . Wears hearing aid     left ear  .  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 PONV (postoperative nausea and vomiting)     Past Surgical History: Past Surgical History  Procedure Date  . Appendectomy   . Combined hysteroscopy diagnostic / d&c   . Cochlear implant   . Portacath placement 02/22/2012    Procedure: INSERTION PORT-A-CATH;  Surgeon: [name], MD;  Location: [facility];  Service: General;  Laterality: Right;    Family History: Family History  Problem Relation Age of Onset  . Cancer Mother     "tumor of face removed"  . Cancer Father     kidney removed  . Cancer Maternal Aunt     stomach & Pancreatic  . Cancer Paternal Uncle     pancreatic    Social History History  Substance Use Topics  . Smoking status: Never Smoker   . Smokeless tobacco: Not on file  . Alcohol Use: 0.0 oz/week    0-1 Glasses of wine per week    Allergies: No Known Allergies  Current Medications: Current Outpatient Prescriptions  Medication Sig Dispense Refill  . Alum & Mag Hydroxide-Simeth (MAGIC MOUTHWASH W/LIDOCAINE) SOLN Take 5 mLs by mouth 4 (four) times daily.  200 mL  6  . bisoprolol-hydrochlorothiazide (ZIAC) 5-6.25 MG per tablet Take 1 tablet by mouth daily.      . ciprofloxacin (CIPRO) 500 MG tablet Take 1 tablet (500 mg total) by mouth 2 (two) times daily.  14 tablet  6  .  cyclobenzaprine (FLEXERIL) 10 MG tablet Take 10 mg by mouth 3 (three) times daily as needed.      Marland Kitchen dexamethasone (DECADRON) 4 MG tablet Take 2 tablets (8 mg total) by mouth 2 (two) times daily with a meal. Take two times a day the day before Taxotere. Then take two times a day starting the day after chemo for 3 days.  30 tablet  1  . DULoxetine (CYMBALTA) 60 MG capsule Take 60 mg by mouth daily.      Marland Kitchen HYDROcodone-acetaminophen (VICODIN) 5-500 MG per tablet Take 1 tablet by mouth every 6 (six) hours as needed.  60 tablet  0  . levothyroxine (SYNTHROID, LEVOTHROID) 100 MCG tablet Take 100 mcg by mouth daily.      Marland Kitchen  lidocaine-prilocaine (EMLA) cream Apply topically as needed.  30 g  4  . LORazepam (ATIVAN) 0.5 MG tablet Take 1 tablet (0.5 mg total) by mouth every 6 (six) hours as needed (Nausea or vomiting).  30 tablet  0  . meloxicam (MOBIC) 15 MG tablet Take 15 mg by mouth daily.      . mometasone (NASONEX) 50 MCG/ACT nasal spray Place 2 sprays into the nose daily.      . ondansetron (ZOFRAN) 8 MG tablet Take 1 tablet (8 mg total) by mouth 2 (two) times daily. Take two times a day starting the day after chemo for 3 days. Then take two times a day as needed for nausea or vomiting.  30 tablet  1  . oxyCODONE-acetaminophen (ROXICET) 5-325 MG per tablet Take 1 tablet by mouth every 4 (four) hours as needed for pain.  10 tablet  0  . potassium chloride (K-DUR) 10 MEQ tablet Take 2 tablets (20 mEq total) by mouth 2 (two) times daily.  8 tablet  0  . prochlorperazine (COMPAZINE) 10 MG tablet Take 1 tablet (10 mg total) by mouth every 6 (six) hours as needed (Nausea or vomiting).  30 tablet  1  . prochlorperazine (COMPAZINE) 25 MG suppository Place 1 suppository (25 mg total) rectally every 12 (twelve) hours as needed for nausea.  12 suppository  3  . ranitidine (ZANTAC) 300 MG tablet Take 300 mg by mouth at bedtime.      Marland Kitchen UNABLE TO FIND Cranial Prothesis  1 each  0    Health Maintenance:  ColonoscopyShe has never had a colonoscopy Bone DensityBone density about 10-12 years ago Last PAP smearLast Pap smear was in 2012   REVIEW OF SYSTEMS: General: fatigue (-), night sweats (-), fever (-), pain (-) Lymph: palpable nodes (-) HEENT: vision changes (-), mucositis (-), gum bleeding (-), epistaxis (-) Cardiovascular: chest pain (-), palpitations (-) Pulmonary: shortness of breath (-), dyspnea on exertion (-), cough (-), hemoptysis (-) GI:  Early satiety (-), melena (-), dysphagia (-), nausea/vomiting (-), diarrhea (-) GU: dysuria (-), hematuria (-), incontinence (-) Musculoskeletal: joint swelling (-), joint  pain (-), back pain (-) Neuro: weakness (-), numbness (-), headache (-), confusion (-) Skin: Rash (-), lesions (-), dryness (-) Psych: depression (-), suicidal/homicidal ideation (-), feeling of hopelessness (-)   PHYSICAL EXAMINATION: Blood pressure 147/72, pulse 81, temperature 98.1 F (36.7 C), temperature source Oral, resp. rate 20, height 5\' 1"  (1.549 m), weight 199 lb 14.4 oz (90.674 kg). General: Patient is a well appearing female in no acute distress HEENT: PERRLA, sclerae anicteric no conjunctival pallor, MMM Neck: supple, no palpable adenopathy Lungs: clear to auscultation bilaterally, no wheezes, rhonchi, or rales Cardiovascular: regular rate rhythm, S1, S2,  no murmurs, rubs or gallops Abdomen: Soft, non-tender, non-distended, normoactive bowel sounds, no HSM Extremities: warm and well perfused, no clubbing, cyanosis, or edema Skin: No rashes or lesions Neuro: Non-focal ECOG PERFORMANCE STATUS: 1 - Symptomatic but completely ambulatory  STUDIES/RESULTS: No results found.   LABS:    Chemistry      Component Value Date/Time   NA 128* 03/06/2012 1040   K 2.9* 03/06/2012 1040   CL 90* 03/06/2012 1040   CO2 29 03/06/2012 1040   BUN 15.0 03/06/2012 1040   CREATININE 1.1 03/06/2012 1040      Component Value Date/Time   CALCIUM 9.3 03/06/2012 1040   ALKPHOS 86 03/06/2012 1040   AST 40* 03/06/2012 1040   ALT 96* 03/06/2012 1040   BILITOT 1.19 03/06/2012 1040      Lab Results  Component Value Date   WBC 9.2 03/14/2012   HGB 11.8 03/14/2012   HCT 33.2* 03/14/2012   MCV 87.8 03/14/2012   PLT 88* 03/14/2012       PATHOLOGY:  ADDITIONAL INFORMATION: PROGNOSTIC INDICATORS - ACIS Results IMMUNOHISTOCHEMICAL AND MORPHOMETRIC ANALYSIS BY THE AUTOMATED CELLULAR IMAGING SYSTEM (ACIS) Estrogen Receptor (Negative, <1%): 0%, NEGATIVE Progesterone Receptor (Negative, <1%): 0%, NEGATIVE Proliferation Marker Ki67 by M IB-1 (Low<20%): 98% All controls stained  appropriately Pecola Leisure MD Pathologist, Electronic Signature ( Signed 02/15/2012) CHROMOGENIC IN-SITU HYBRIDIZATION Interpretation: HER2/NEU BY CISH - SHOWS AMPLIFICATION BY CISH ANALYSIS. THE RATIO OF HER2: CEP 17 SIGNALS WAS 2.55. 40 TUMOR CELLS WERE SCORED. OF NOTE, THE TUMOR APPEARS TO SHOW HETEROGENEITY IN REGARDS TO HER2 EXPRESSION. Reference range: Ratio: HER2:CEP17 < 1.8 gene amplification not observed Ratio: HER2:CEP 17 1.8-2.2 - equivocal result Ratio: HER2:CEP17 > 2.2 - gene amplification observed Pecola Leisure MD Pathologist, Electronic Signature ( Signed 02/15/2012) 1 of ADDITIONAL INFORMATION: PROGNOSTIC INDICATORS - ACIS Results IMMUNOHISTOCHEMICAL AND MORPHOMETRIC ANALYSIS BY THE AUTOMATED CELLULAR IMAGING SYSTEM (ACIS) Estrogen Receptor (Negative, <1%): 0%, NEGATIVE Progesterone Receptor (Negative, <1%): 0%, NEGATIVE COMMENT: The negative hormone receptor study(ies) in this case have an internal positive control. All controls stained appropriately Abigail Miyamoto MD Pathologist, Electronic Signature ( Signed 02/22/2012)  ASSESSMENT #1 Stage 1 HER-2 positive left breast cancer ER negative PR negative.  Patient is being considered for neoadjuvant chemotherapy consisting of Taxotere carboplatinum and herceptin as she does want breast conservation.  She understands the risks and benefits of this approach and the side effects of her chemotherapy and Herceptin.  #2 She understands she will receive neulasta the day after chemotherapy to keep her white count up.    #3 She does have thrombocytopenia and we will monitor this  #4 Mucositis  PLAN:    #1Ms. Mckinney is day 15 cycle 1 of TCH.  She will proceed with Herceptin treatment today.  She is feeling much better and her CBC is improved.  We will add on IV fluids to her treatment next week, and plan on giving them the week after as well to avoid dehydration.    #2 She will continue to use the magic mouthwash for her  mucositis.  I prescribed Acyclovir for her to take since her oral ulcerations have yet to improve.  Her PO intake has improved, and that is encouraging.   #3  She has not had very good intake, due to her severe pain I refilled her Lorcet with the understanding that her PCP will fill this from here on out.  Also, we will add her on for IV fluids today.  All questions were answered. The patient knows to call the clinic with any problems, questions or concerns. We can certainly see the patient much sooner if necessary.  I spent 25 minutes counseling the patient face to face. The total time spent in the appointment was 30 minutes.  This case was reviewed by Dr. Welton Flakes.  Cherie Ouch Lyn Hollingshead, NP Medical Oncology [facility] Phone: [phone]  03/14/2012, 9:15 AM

## 2012-03-14 NOTE — Patient Instructions (Signed)

## 2012-03-20 ENCOUNTER — Other Ambulatory Visit: Payer: BC Managed Care – PPO | Admitting: Lab

## 2012-03-21 ENCOUNTER — Other Ambulatory Visit (HOSPITAL_BASED_OUTPATIENT_CLINIC_OR_DEPARTMENT_OTHER): Payer: BC Managed Care – PPO | Admitting: Lab

## 2012-03-21 ENCOUNTER — Encounter: Payer: Self-pay | Admitting: Adult Health

## 2012-03-21 ENCOUNTER — Ambulatory Visit (HOSPITAL_BASED_OUTPATIENT_CLINIC_OR_DEPARTMENT_OTHER): Payer: BC Managed Care – PPO

## 2012-03-21 ENCOUNTER — Ambulatory Visit (HOSPITAL_BASED_OUTPATIENT_CLINIC_OR_DEPARTMENT_OTHER): Payer: BC Managed Care – PPO | Admitting: Adult Health

## 2012-03-21 VITALS — BP 125/56 | HR 87 | Temp 98.3°F | Resp 20 | Ht 61.0 in | Wt 201.1 lb

## 2012-03-21 DIAGNOSIS — C50419 Malignant neoplasm of upper-outer quadrant of unspecified female breast: Secondary | ICD-10-CM

## 2012-03-21 DIAGNOSIS — Z171 Estrogen receptor negative status [ER-]: Secondary | ICD-10-CM

## 2012-03-21 DIAGNOSIS — D696 Thrombocytopenia, unspecified: Secondary | ICD-10-CM

## 2012-03-21 DIAGNOSIS — Z5111 Encounter for antineoplastic chemotherapy: Secondary | ICD-10-CM

## 2012-03-21 DIAGNOSIS — E860 Dehydration: Secondary | ICD-10-CM

## 2012-03-21 DIAGNOSIS — K1230 Oral mucositis (ulcerative), unspecified: Secondary | ICD-10-CM

## 2012-03-21 LAB — COMPREHENSIVE METABOLIC PANEL (CC13)
AST: 25 U/L (ref 5–34)
Albumin: 3.3 g/dL — ABNORMAL LOW (ref 3.5–5.0)
BUN: 14 mg/dL (ref 7.0–26.0)
Calcium: 9.5 mg/dL (ref 8.4–10.4)
Chloride: 102 mEq/L (ref 98–107)
Glucose: 130 mg/dl — ABNORMAL HIGH (ref 70–99)
Potassium: 3.9 mEq/L (ref 3.5–5.1)

## 2012-03-21 LAB — CBC WITH DIFFERENTIAL/PLATELET
Eosinophils Absolute: 0 10*3/uL (ref 0.0–0.5)
HCT: 30.5 % — ABNORMAL LOW (ref 34.8–46.6)
LYMPH%: 12.3 % — ABNORMAL LOW (ref 14.0–49.7)
MCV: 89.4 fL (ref 79.5–101.0)
MONO#: 0.3 10*3/uL (ref 0.1–0.9)
MONO%: 3.4 % (ref 0.0–14.0)
NEUT#: 8.4 10*3/uL — ABNORMAL HIGH (ref 1.5–6.5)
NEUT%: 84.2 % — ABNORMAL HIGH (ref 38.4–76.8)
Platelets: 165 10*3/uL (ref 145–400)
WBC: 10 10*3/uL (ref 3.9–10.3)

## 2012-03-21 MED ORDER — SODIUM CHLORIDE 0.9 % IJ SOLN
10.0000 mL | INTRAMUSCULAR | Status: DC | PRN
  Administered 2012-03-21: 10 mL
  Filled 2012-03-21: qty 10

## 2012-03-21 MED ORDER — SODIUM CHLORIDE 0.9 % IV SOLN: Freq: Once | INTRAVENOUS | Status: DC

## 2012-03-21 MED ORDER — ACETAMINOPHEN 325 MG PO TABS
650.0000 mg | ORAL_TABLET | Freq: Once | ORAL | Status: AC
  Administered 2012-03-21: 650 mg via ORAL

## 2012-03-21 MED ORDER — SODIUM CHLORIDE 0.9 % IV SOLN
2.0000 mg/kg | Freq: Once | INTRAVENOUS | Status: AC
  Administered 2012-03-21: 189 mg via INTRAVENOUS
  Filled 2012-03-21: qty 9

## 2012-03-21 MED ORDER — SODIUM CHLORIDE 0.9 % IV SOLN
Freq: Once | INTRAVENOUS | Status: AC
  Administered 2012-03-21: 10:00:00 via INTRAVENOUS

## 2012-03-21 MED ORDER — DEXAMETHASONE SODIUM PHOSPHATE 4 MG/ML IJ SOLN
20.0000 mg | Freq: Once | INTRAMUSCULAR | Status: AC
  Administered 2012-03-21: 20 mg via INTRAVENOUS

## 2012-03-21 MED ORDER — HEPARIN SOD (PORK) LOCK FLUSH 100 UNIT/ML IV SOLN
500.0000 [IU] | Freq: Once | INTRAVENOUS | Status: AC | PRN
  Administered 2012-03-21: 500 [IU]
  Filled 2012-03-21: qty 5

## 2012-03-21 MED ORDER — ONDANSETRON 16 MG/50ML IVPB (CHCC)
16.0000 mg | Freq: Once | INTRAVENOUS | Status: AC
  Administered 2012-03-21: 16 mg via INTRAVENOUS

## 2012-03-21 MED ORDER — DIPHENHYDRAMINE HCL 25 MG PO CAPS
50.0000 mg | ORAL_CAPSULE | Freq: Once | ORAL | Status: AC
  Administered 2012-03-21: 50 mg via ORAL

## 2012-03-21 MED ORDER — DOCETAXEL CHEMO INJECTION 160 MG/16ML
75.0000 mg/m2 | Freq: Once | INTRAVENOUS | Status: AC
  Administered 2012-03-21: 150 mg via INTRAVENOUS
  Filled 2012-03-21: qty 15

## 2012-03-21 MED ORDER — SODIUM CHLORIDE 0.9 % IV SOLN
616.2000 mg | Freq: Once | INTRAVENOUS | Status: AC
  Administered 2012-03-21: 620 mg via INTRAVENOUS
  Filled 2012-03-21: qty 62

## 2012-03-21 NOTE — Patient Instructions (Addendum)
[facility] Discharge Instructions for Patients Receiving Chemotherapy  Today you received the following chemotherapy agents taxotere, carboplatin, herceptin  To help prevent nausea and vomiting after your treatment, we encourage you to take your nausea medication as perscribed Begin taking it at 5 p.m.  and take it as often as prescribed for the next 72 hours.   If you develop nausea and vomiting that is not controlled by your nausea medication, call the clinic. If it is after clinic hours your family physician or the after hours number for the clinic or go to the Emergency Department.   BELOW ARE SYMPTOMS THAT SHOULD BE REPORTED IMMEDIATELY:  *FEVER GREATER THAN 100.5 F  *CHILLS WITH OR WITHOUT FEVER  NAUSEA AND VOMITING THAT IS NOT CONTROLLED WITH YOUR NAUSEA MEDICATION  *UNUSUAL SHORTNESS OF BREATH  *UNUSUAL BRUISING OR BLEEDING  TENDERNESS IN MOUTH AND THROAT WITH OR WITHOUT PRESENCE OF ULCERS  *URINARY PROBLEMS  *BOWEL PROBLEMS  UNUSUAL RASH Items with * indicate a potential emergency and should be followed up as soon as possible.  One of the nurses will contact you 24 hours after your treatment. Please let the nurse know about any problems that you may have experienced. Feel free to call the clinic you have any questions or concerns. The clinic phone number is [phone].   I have been informed and understand all the instructions given to me. I know to contact the clinic, my physician, or go to the Emergency Department if any problems should occur. I do not have any questions at this time, but understand that I may call the clinic during office hours   should I have any questions or need assistance in obtaining follow up care.    __________________________________________  _____________  __________ Signature of Patient or Authorized Representative            Date                   Time    __________________________________________ Nurse's  Signature

## 2012-03-21 NOTE — Patient Instructions (Addendum)
Doing well.  Proceed with chemotherapy.  You will receive IV fluids today and tomorrow in addition to your treatment.  We will see you back on Wednesday for an appt and IV fluids again.  Take Tylenol and Claritin for 3 days after your Neulasta shot.  Please call us if you have any questions or concerns.

## 2012-03-21 NOTE — Progress Notes (Signed)
Maria Mckinney [id number] Jun 20, 1950 61 y.o. 03/21/2012 9:27 AM  CC  Maria Ravel, MD Dr. [name] [address] [place] [address] Dr. [name] Dr. [name] Dr. 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  PRIOR THERAPY:  #1Patient was originally seen in the multidisciplinary breast clinic for new diagnosis of left breast cancer that was 8mm in the upper outer quadrant of the left breast.  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  There was an additional 4 cm area of normal signal.  Patient did not get an MRI secondary to cochlear implant.  She desires breast conservation.    #2 Patient is s/p port-a-cath placement for neoadjuvant chemotherapy consisting of Taxotere/carboplatinum/Herceptin.  She will begin cycle #1 on 02/29/2012.  She will receive Taxotere and carboplatinum every 21 days with Herceptin giving weekly.  She will receive a total of 4 cycles of Taxotere and Carboplatinum.    CURRENT THERAPY: C1 Day 15 of TCH (weekly Herceptin).  INTERVAL HISTORY: Ms. Chica is returning for cycle 2 of TCH.  She is doing well today.  She was given a prescription for Acyclovir last week, however once she started taking it she broke out in a rash.  She discontinued taking it, and took a Benadryl and her rash improved.  Her mucositis and PO intake continue to improve.  She has no numbness/tingling.  She is feeling well and ready for her next cycle of treatment.    Past Medical History: Past Medical History  Diagnosis Date  . HTN (hypertension)   . Fibromyalgia   . GERD (gastroesophageal reflux disease)   . Arthritis   . Anxiety   . Dysrhythmia     irreg hr  . Wears hearing aid     left ear  . 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 PONV (postoperative nausea and vomiting)      Past Surgical History: Past Surgical History  Procedure Date  . Appendectomy   . Combined hysteroscopy diagnostic / d&c   . Cochlear implant   . Portacath placement 02/22/2012    Procedure: INSERTION PORT-A-CATH;  Surgeon: [name], MD;  Location: [facility];  Service: General;  Laterality: Right;    Family History: Family History  Problem Relation Age of Onset  . Cancer Mother     "tumor of face removed"  . Cancer Father     kidney removed  . Cancer Maternal Aunt     stomach & Pancreatic  . Cancer Paternal Uncle     pancreatic    Social History History  Substance Use Topics  . Smoking status: Never Smoker   . Smokeless tobacco: Not on file  . Alcohol Use: 0.0 oz/week    0-1 Glasses of wine per week    Allergies: Allergies  Allergen Reactions  . Acyclovir And Related   . Ciprofloxacin Hcl Rash    Current Medications: Current Outpatient Prescriptions  Medication Sig Dispense Refill  . Alum & Mag Hydroxide-Simeth (MAGIC MOUTHWASH W/LIDOCAINE) SOLN Take 5 mLs by mouth 4 (four) times daily.  200 mL  6  . bisoprolol-hydrochlorothiazide (ZIAC) 5-6.25 MG per tablet Take 1 tablet by mouth daily.      . cyclobenzaprine (FLEXERIL) 10 MG tablet Take 10 mg by mouth 3 (three) times daily as needed.      Marland Kitchen  dexamethasone (DECADRON) 4 MG tablet Take 2 tablets (8 mg total) by mouth 2 (two) times daily with a meal. Take two times a day the day before Taxotere. Then take two times a day starting the day after chemo for 3 days.  30 tablet  1  . DULoxetine (CYMBALTA) 60 MG capsule Take 60 mg by mouth daily.      Marland Kitchen HYDROcodone-acetaminophen (VICODIN) 5-500 MG per tablet Take 1 tablet by mouth every 6 (six) hours as needed.  60 tablet  0  . levothyroxine (SYNTHROID, LEVOTHROID) 100 MCG tablet Take 100 mcg by mouth daily.      Marland Kitchen lidocaine-prilocaine (EMLA) cream Apply topically as needed.  30 g  4  . LORazepam (ATIVAN) 0.5 MG tablet Take 1 tablet (0.5 mg total)  by mouth every 6 (six) hours as needed (Nausea or vomiting).  30 tablet  0  . meloxicam (MOBIC) 15 MG tablet Take 15 mg by mouth daily.      . mometasone (NASONEX) 50 MCG/ACT nasal spray Place 2 sprays into the nose daily.      . ondansetron (ZOFRAN) 8 MG tablet Take 1 tablet (8 mg total) by mouth 2 (two) times daily. Take two times a day starting the day after chemo for 3 days. Then take two times a day as needed for nausea or vomiting.  30 tablet  1  . potassium chloride SA (K-DUR,KLOR-CON) 20 MEQ tablet Pt is to take 40 MEQ (2 tablets) daily  60 tablet  0  . prochlorperazine (COMPAZINE) 10 MG tablet Take 1 tablet (10 mg total) by mouth every 6 (six) hours as needed (Nausea or vomiting).  30 tablet  1  . prochlorperazine (COMPAZINE) 25 MG suppository Place 1 suppository (25 mg total) rectally every 12 (twelve) hours as needed for nausea.  12 suppository  3  . ranitidine (ZANTAC) 300 MG tablet Take 300 mg by mouth at bedtime.      Marland Kitchen UNABLE TO FIND Cranial Prothesis  1 each  0    Health Maintenance:  ColonoscopyShe has never had a colonoscopy Bone DensityBone density about 10-12 years ago Last PAP smearLast Pap smear was in 2012   REVIEW OF SYSTEMS: General: fatigue (-), night sweats (-), fever (-), pain (-) Lymph: palpable nodes (-) HEENT: vision changes (-), mucositis (-), gum bleeding (-), epistaxis (-) Cardiovascular: chest pain (-), palpitations (-) Pulmonary: shortness of breath (-), dyspnea on exertion (-), cough (-), hemoptysis (-) GI:  Early satiety (-), melena (-), dysphagia (-), nausea/vomiting (-), diarrhea (-) GU: dysuria (-), hematuria (-), incontinence (-) Musculoskeletal: joint swelling (-), joint pain (-), back pain (-) Neuro: weakness (-), numbness (-), headache (-), confusion (-) Skin: Rash (-), lesions (-), dryness (-) Psych: depression (-), suicidal/homicidal ideation (-), feeling of hopelessness (-)   PHYSICAL EXAMINATION: Blood pressure 125/56, pulse 87,  temperature 98.3 F (36.8 C), temperature source Oral, resp. rate 20, height 5\' 1"  (1.549 m), weight 201 lb 1.6 oz (91.218 kg). General: Patient is a well appearing female in no acute distress HEENT: PERRLA, sclerae anicteric no conjunctival pallor, MMM Neck: supple, no palpable adenopathy Lungs: clear to auscultation bilaterally, no wheezes, rhonchi, or rales Cardiovascular: regular rate rhythm, S1, S2, no murmurs, rubs or gallops Abdomen: Soft, non-tender, non-distended, normoactive bowel sounds, no HSM Extremities: warm and well perfused, no clubbing, cyanosis, or edema Skin: No rashes or lesions Neuro: Non-focal ECOG PERFORMANCE STATUS: 1 - Symptomatic but completely ambulatory Breasts: unable to palpate left breast mass, no nodularity, right  breast: no masses or nodularity STUDIES/RESULTS: No results found.   LABS:    Chemistry      Component Value Date/Time   NA 135* 03/14/2012 0840   K 3.0* 03/14/2012 0840   CL 93* 03/14/2012 0840   CO2 33* 03/14/2012 0840   BUN 8.0 03/14/2012 0840   CREATININE 1.0 03/14/2012 0840      Component Value Date/Time   CALCIUM 9.3 03/14/2012 0840   ALKPHOS 88 03/14/2012 0840   AST 32 03/14/2012 0840   ALT 40 03/14/2012 0840   BILITOT 0.46 03/14/2012 0840      Lab Results  Component Value Date   WBC 10.0 03/21/2012   HGB 10.1* 03/21/2012   HCT 30.5* 03/21/2012   MCV 89.4 03/21/2012   PLT 165 03/21/2012       PATHOLOGY:  ADDITIONAL INFORMATION: PROGNOSTIC INDICATORS - ACIS Results IMMUNOHISTOCHEMICAL AND MORPHOMETRIC ANALYSIS BY THE AUTOMATED CELLULAR IMAGING SYSTEM (ACIS) Estrogen Receptor (Negative, <1%): 0%, NEGATIVE Progesterone Receptor (Negative, <1%): 0%, NEGATIVE Proliferation Marker Ki67 by M IB-1 (Low<20%): 98% All controls stained appropriately Pecola Leisure MD Pathologist, Electronic Signature ( Signed 02/15/2012) CHROMOGENIC IN-SITU HYBRIDIZATION Interpretation: HER2/NEU BY CISH - SHOWS AMPLIFICATION BY CISH  ANALYSIS. THE RATIO OF HER2: CEP 17 SIGNALS WAS 2.55. 40 TUMOR CELLS WERE SCORED. OF NOTE, THE TUMOR APPEARS TO SHOW HETEROGENEITY IN REGARDS TO HER2 EXPRESSION. Reference range: Ratio: HER2:CEP17 < 1.8 gene amplification not observed Ratio: HER2:CEP 17 1.8-2.2 - equivocal result Ratio: HER2:CEP17 > 2.2 - gene amplification observed Pecola Leisure MD Pathologist, Electronic Signature ( Signed 02/15/2012) 1 of ADDITIONAL INFORMATION: PROGNOSTIC INDICATORS - ACIS Results IMMUNOHISTOCHEMICAL AND MORPHOMETRIC ANALYSIS BY THE AUTOMATED CELLULAR IMAGING SYSTEM (ACIS) Estrogen Receptor (Negative, <1%): 0%, NEGATIVE Progesterone Receptor (Negative, <1%): 0%, NEGATIVE COMMENT: The negative hormone receptor study(ies) in this case have an internal positive control. All controls stained appropriately Abigail Miyamoto MD Pathologist, Electronic Signature ( Signed 02/22/2012)  ASSESSMENT #1 Stage 1 HER-2 positive left breast cancer ER negative PR negative.  Patient is being considered for neoadjuvant chemotherapy consisting of Taxotere carboplatinum and herceptin as she does want breast conservation.  She understands the risks and benefits of this approach and the side effects of her chemotherapy and Herceptin.  #2 She understands she will receive neulasta the day after chemotherapy to keep her white count up.    #3 She does have thrombocytopenia and we will monitor this  #4 Mucositis  PLAN:    #1Ms. Schoenfelder is ready to proceed with cycle 2 of her treatment today.  We will give her IV fluids today and tomorrow to help prevent dehydration that she experienced with her previous cycle.      #2 She continues using biotene and a soft bristle toothbrush.  Her mouth is improved.   #3 We will see her back next week for Herceptin and interim labs.     All questions were answered. The patient knows to call the clinic with any problems, questions or concerns. We can certainly see the patient much sooner  if necessary.  I spent 25 minutes counseling the patient face to face. The total time spent in the appointment was 30 minutes.  This case was reviewed by Dr. Welton Flakes.  Cherie Ouch Lyn Hollingshead, NP Medical Oncology [facility] Phone: [phone]  03/21/2012, 9:27 AM

## 2012-03-22 ENCOUNTER — Ambulatory Visit: Payer: BC Managed Care – PPO

## 2012-03-22 ENCOUNTER — Other Ambulatory Visit: Payer: Self-pay | Admitting: Certified Registered Nurse Anesthetist

## 2012-03-22 ENCOUNTER — Ambulatory Visit (HOSPITAL_BASED_OUTPATIENT_CLINIC_OR_DEPARTMENT_OTHER): Payer: BC Managed Care – PPO

## 2012-03-22 ENCOUNTER — Encounter (INDEPENDENT_AMBULATORY_CARE_PROVIDER_SITE_OTHER): Payer: BC Managed Care – PPO | Admitting: General Surgery

## 2012-03-22 DIAGNOSIS — E860 Dehydration: Secondary | ICD-10-CM

## 2012-03-22 DIAGNOSIS — Z5189 Encounter for other specified aftercare: Secondary | ICD-10-CM

## 2012-03-22 DIAGNOSIS — C50419 Malignant neoplasm of upper-outer quadrant of unspecified female breast: Secondary | ICD-10-CM

## 2012-03-22 MED ORDER — SODIUM CHLORIDE 0.9 % IV SOLN
Freq: Once | INTRAVENOUS | Status: AC
  Administered 2012-03-22: 12:00:00 via INTRAVENOUS

## 2012-03-22 MED ORDER — PEGFILGRASTIM INJECTION 6 MG/0.6ML
6.0000 mg | Freq: Once | SUBCUTANEOUS | Status: AC
Start: 2012-03-22 — End: 2012-03-22
  Administered 2012-03-22: 6 mg via SUBCUTANEOUS
  Filled 2012-03-22: qty 0.6

## 2012-03-22 NOTE — Patient Instructions (Signed)
Dehydration, Adult  Today you received IV fluids to prevent dehydration. Increase your fluid intake at home and take antiemetics as prescribed for the next 48 hours. Dehydration is when you lose more fluids from the body than you take in. Vital organs like the kidneys, brain, and heart cannot function without a proper amount of fluids and salt. Any loss of fluids from the body can cause dehydration.  CAUSES   Vomiting.  Diarrhea.  Excessive sweating.  Excessive urine output.  Fever. SYMPTOMS  Mild dehydration  Thirst.  Dry lips.  Slightly dry mouth. Moderate dehydration  Very dry mouth.  Sunken eyes.  Skin does not bounce back quickly when lightly pinched and released.  Dark urine and decreased urine production.  Decreased tear production.  Headache. Severe dehydration  Very dry mouth.  Extreme thirst.  Rapid, weak pulse (more than 100 beats per minute at rest).  Cold hands and feet.  Not able to sweat in spite of heat and temperature.  Rapid breathing.  Blue lips.  Confusion and lethargy.  Difficulty being awakened.  Minimal urine production.  No tears. DIAGNOSIS  Your caregiver will diagnose dehydration based on your symptoms and your exam. Blood and urine tests will help confirm the diagnosis. The diagnostic evaluation should also identify the cause of dehydration. TREATMENT  Treatment of mild or moderate dehydration can often be done at home by increasing the amount of fluids that you drink. It is best to drink small amounts of fluid more often. Drinking too much at one time can make vomiting worse. Refer to the home care instructions below. Severe dehydration needs to be treated at the hospital where you will probably be given intravenous (IV) fluids that contain water and electrolytes. HOME CARE INSTRUCTIONS   Ask your caregiver about specific rehydration instructions.  Drink enough fluids to keep your urine clear or pale yellow.  Drink  small amounts frequently if you have nausea and vomiting.  Eat as you normally do.  Avoid:  Foods or drinks high in sugar.  Carbonated drinks.  Juice.  Extremely hot or cold fluids.  Drinks with caffeine.  Fatty, greasy foods.  Alcohol.  Tobacco.  Overeating.  Gelatin desserts.  Wash your hands well to avoid spreading bacteria and viruses.  Only take over-the-counter or prescription medicines for pain, discomfort, or fever as directed by your caregiver.  Ask your caregiver if you should continue all prescribed and over-the-counter medicines.  Keep all follow-up appointments with your caregiver. SEEK MEDICAL CARE IF:  You have abdominal pain and it increases or stays in one area (localizes).  You have a rash, stiff neck, or severe headache.  You are irritable, sleepy, or difficult to awaken.  You are weak, dizzy, or extremely thirsty. SEEK IMMEDIATE MEDICAL CARE IF:   You are unable to keep fluids down or you get worse despite treatment.  You have frequent episodes of vomiting or diarrhea.  You have blood or green matter (bile) in your vomit.  You have blood in your stool or your stool looks black and tarry.  You have not urinated in 6 to 8 hours, or you have only urinated a small amount of very dark urine.  You have a fever.  You faint. MAKE SURE YOU:   Understand these instructions.  Will watch your condition.  Will get help right away if you are not doing well or get worse. Document Released: 04/18/2005 Document Revised: 07/11/2011 Document Reviewed: 12/06/2010 [facility] Patient Information 2013 [place], [place].

## 2012-03-26 ENCOUNTER — Telehealth: Payer: Self-pay | Admitting: Emergency Medicine

## 2012-03-26 ENCOUNTER — Ambulatory Visit: Payer: BC Managed Care – PPO | Admitting: Adult Health

## 2012-03-26 ENCOUNTER — Other Ambulatory Visit: Payer: Self-pay | Admitting: Emergency Medicine

## 2012-03-26 NOTE — Telephone Encounter (Signed)
She should drink gatoraid and water, take a clear liquid diet, she can take immodium as needed, keep appointment as scheduled

## 2012-03-26 NOTE — Telephone Encounter (Signed)
Patient calls with complaints of diarrhea and abd cramping. Patient states she feels bloated. Patient asking if she can take an antidiarrheal. Patient scheduled for labs/provider/herceptin and IVF on 11/27. Patient states passing lots of gas and 1-2 episodes of diarrhea a day. Patient states that she is drinking cranberry juice, but has not been eating as well.

## 2012-03-27 ENCOUNTER — Other Ambulatory Visit: Payer: BC Managed Care – PPO | Admitting: Lab

## 2012-03-27 ENCOUNTER — Other Ambulatory Visit (HOSPITAL_BASED_OUTPATIENT_CLINIC_OR_DEPARTMENT_OTHER): Payer: BC Managed Care – PPO | Admitting: Lab

## 2012-03-27 ENCOUNTER — Ambulatory Visit (HOSPITAL_BASED_OUTPATIENT_CLINIC_OR_DEPARTMENT_OTHER): Payer: BC Managed Care – PPO

## 2012-03-27 ENCOUNTER — Other Ambulatory Visit: Payer: Self-pay | Admitting: Emergency Medicine

## 2012-03-27 ENCOUNTER — Encounter: Payer: Self-pay | Admitting: Adult Health

## 2012-03-27 ENCOUNTER — Ambulatory Visit (HOSPITAL_BASED_OUTPATIENT_CLINIC_OR_DEPARTMENT_OTHER): Payer: BC Managed Care – PPO | Admitting: Adult Health

## 2012-03-27 ENCOUNTER — Ambulatory Visit (HOSPITAL_BASED_OUTPATIENT_CLINIC_OR_DEPARTMENT_OTHER): Payer: BC Managed Care – PPO | Admitting: Lab

## 2012-03-27 VITALS — BP 126/74 | HR 86 | Temp 99.0°F | Resp 20 | Ht 61.0 in | Wt 191.3 lb

## 2012-03-27 DIAGNOSIS — C50119 Malignant neoplasm of central portion of unspecified female breast: Secondary | ICD-10-CM

## 2012-03-27 DIAGNOSIS — R300 Dysuria: Secondary | ICD-10-CM

## 2012-03-27 DIAGNOSIS — E860 Dehydration: Secondary | ICD-10-CM

## 2012-03-27 DIAGNOSIS — Z171 Estrogen receptor negative status [ER-]: Secondary | ICD-10-CM

## 2012-03-27 DIAGNOSIS — C50419 Malignant neoplasm of upper-outer quadrant of unspecified female breast: Secondary | ICD-10-CM

## 2012-03-27 DIAGNOSIS — K529 Noninfective gastroenteritis and colitis, unspecified: Secondary | ICD-10-CM

## 2012-03-27 DIAGNOSIS — D696 Thrombocytopenia, unspecified: Secondary | ICD-10-CM

## 2012-03-27 LAB — CBC WITH DIFFERENTIAL/PLATELET
BASO%: 0 % (ref 0.0–2.0)
LYMPH%: 62.2 % — ABNORMAL HIGH (ref 14.0–49.7)
MCH: 30.1 pg (ref 25.1–34.0)
MCHC: 34.3 g/dL (ref 31.5–36.0)
MCV: 87.6 fL (ref 79.5–101.0)
MONO%: 12.6 % (ref 0.0–14.0)
Platelets: 96 10*3/uL — ABNORMAL LOW (ref 145–400)
RBC: 3.79 10*6/uL (ref 3.70–5.45)
WBC: 1.4 10*3/uL — ABNORMAL LOW (ref 3.9–10.3)
nRBC: 0 % (ref 0–0)

## 2012-03-27 LAB — COMPREHENSIVE METABOLIC PANEL (CC13)
ALT: 70 U/L — ABNORMAL HIGH (ref 0–55)
Albumin: 3.7 g/dL (ref 3.5–5.0)
BUN: 19 mg/dL (ref 7.0–26.0)
CO2: 30 mEq/L — ABNORMAL HIGH (ref 22–29)
Calcium: 8.9 mg/dL (ref 8.4–10.4)
Chloride: 96 mEq/L — ABNORMAL LOW (ref 98–107)
Creatinine: 0.9 mg/dL (ref 0.6–1.1)

## 2012-03-27 MED ORDER — SODIUM CHLORIDE 0.9 % IV SOLN
1000.0000 mL | Freq: Once | INTRAVENOUS | Status: AC
  Administered 2012-03-27: 1000 mL via INTRAVENOUS

## 2012-03-27 MED ORDER — SULFAMETHOXAZOLE-TMP DS 800-160 MG PO TABS: 1.0000 | ORAL_TABLET | Freq: Two times a day (BID) | ORAL | Status: DC

## 2012-03-27 MED ORDER — HEPARIN SOD (PORK) LOCK FLUSH 100 UNIT/ML IV SOLN
500.0000 [IU] | Freq: Once | INTRAVENOUS | Status: AC
  Administered 2012-03-27: 500 [IU] via INTRAVENOUS
  Filled 2012-03-27: qty 5

## 2012-03-27 MED ORDER — SODIUM CHLORIDE 0.9 % IJ SOLN
10.0000 mL | INTRAMUSCULAR | Status: DC | PRN
  Administered 2012-03-27: 10 mL via INTRAVENOUS
  Filled 2012-03-27: qty 10

## 2012-03-27 NOTE — Progress Notes (Signed)
Maria Mckinney [id number] [birth year] 61 y.o. 03/27/2012 4:54 PM  CC  Maria Ravel, MD Dr. [name] [address] [place] [address] Dr. [name] Dr. [name] Dr. 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  PRIOR THERAPY:  #1Patient was originally seen in the multidisciplinary breast clinic for new diagnosis of left breast cancer that was 8mm in the upper outer quadrant of the left breast.  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  There was an additional 4 cm area of normal signal.  Patient did not get an MRI secondary to cochlear implant.  She desires breast conservation.    #2 Patient is s/p port-a-cath placement for neoadjuvant chemotherapy consisting of Taxotere/carboplatinum/Herceptin.  She will begin cycle #1 on 02/29/2012.  She will receive Taxotere and carboplatinum every 21 days with Herceptin giving weekly.  She will receive a total of 4 cycles of Taxotere and Carboplatinum.    CURRENT THERAPY: C2 day 7 TCH with Neulasta support, (weekly herceptin)  INTERVAL HISTORY: Maria Mckinney is feeling very weak today.  She's had 10 episodes of diarrhea in the past 24 hours, she has experienced dysuria, bone pain and mouth pain, along with decreased oral intake.  She has no abdominal pain, only cramping when she has a bowel movement.    Past Medical History: Past Medical History  Diagnosis Date  . HTN (hypertension)   . Fibromyalgia   . GERD (gastroesophageal reflux disease)   . Arthritis   . Anxiety   . Dysrhythmia     irreg hr  . Wears hearing aid     left ear  . 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 PONV (postoperative nausea and vomiting)     Past Surgical History: Past Surgical History  Procedure Date  . Appendectomy   . Combined hysteroscopy diagnostic /  d&c   . Cochlear implant   . Portacath placement 02/22/2012    Procedure: INSERTION PORT-A-CATH;  Surgeon: [name], MD;  Location: [facility];  Service: General;  Laterality: Right;    Family History: Family History  Problem Relation Age of Onset  . Cancer Mother     "tumor of face removed"  . Cancer Father     kidney removed  . Cancer Maternal Aunt     stomach & Pancreatic  . Cancer Paternal Uncle     pancreatic    Social History History  Substance Use Topics  . Smoking status: Never Smoker   . Smokeless tobacco: Not on file  . Alcohol Use: 0.0 oz/week    0-1 Glasses of wine per week    Allergies: Allergies  Allergen Reactions  . Acyclovir And Related   . Ciprofloxacin Hcl Rash    Current Medications: Current Outpatient Prescriptions  Medication Sig Dispense Refill  . Alum & Mag Hydroxide-Simeth (MAGIC MOUTHWASH W/LIDOCAINE) SOLN Take 5 mLs by mouth 4 (four) times daily.  200 mL  6  . bisoprolol-hydrochlorothiazide (ZIAC) 5-6.25 MG per tablet Take 1 tablet by mouth daily.      . cyclobenzaprine (FLEXERIL) 10 MG tablet Take 10 mg by mouth 3 (three) times daily as needed.      Marland Kitchen dexamethasone (DECADRON) 4 MG tablet Take 2 tablets (8 mg total) by mouth 2 (two) times daily with a meal. Take two times a day the day  before Taxotere. Then take two times a day starting the day after chemo for 3 days.  30 tablet  1  . DULoxetine (CYMBALTA) 60 MG capsule Take 60 mg by mouth daily.      Marland Kitchen HYDROcodone-acetaminophen (VICODIN) 5-500 MG per tablet Take 1 tablet by mouth every 6 (six) hours as needed.  60 tablet  0  . levothyroxine (SYNTHROID, LEVOTHROID) 100 MCG tablet Take 100 mcg by mouth daily.      Marland Kitchen lidocaine-prilocaine (EMLA) cream Apply topically as needed.  30 g  4  . LORazepam (ATIVAN) 0.5 MG tablet Take 1 tablet (0.5 mg total) by mouth every 6 (six) hours as needed (Nausea or vomiting).  30 tablet  0  . meloxicam (MOBIC) 15 MG tablet Take 15 mg by  mouth daily.      . mometasone (NASONEX) 50 MCG/ACT nasal spray Place 2 sprays into the nose daily.      . ondansetron (ZOFRAN) 8 MG tablet Take 1 tablet (8 mg total) by mouth 2 (two) times daily. Take two times a day starting the day after chemo for 3 days. Then take two times a day as needed for nausea or vomiting.  30 tablet  1  . potassium chloride SA (K-DUR,KLOR-CON) 20 MEQ tablet Pt is to take 40 MEQ (2 tablets) daily  60 tablet  0  . prochlorperazine (COMPAZINE) 10 MG tablet Take 1 tablet (10 mg total) by mouth every 6 (six) hours as needed (Nausea or vomiting).  30 tablet  1  . prochlorperazine (COMPAZINE) 25 MG suppository Place 1 suppository (25 mg total) rectally every 12 (twelve) hours as needed for nausea.  12 suppository  3  . ranitidine (ZANTAC) 300 MG tablet Take 300 mg by mouth at bedtime.      . sulfamethoxazole-trimethoprim (BACTRIM DS) 800-160 MG per tablet Take 1 tablet by mouth 2 (two) times daily.  14 tablet  0  . UNABLE TO FIND Cranial Prothesis  1 each  0   No current facility-administered medications for this visit.   Facility-Administered Medications Ordered in Other Visits  Medication Dose Route Frequency Provider Last Rate Last Dose  . [COMPLETED] 0.9 %  sodium chloride infusion  1,000 mL Intravenous Once Augustin Schooling, NP   1,000 mL at 03/27/12 1415  . [COMPLETED] heparin lock flush 100 unit/mL  500 Units Intravenous Once Victorino December, MD   500 Units at 03/27/12 1627  . sodium chloride 0.9 % injection 10 mL  10 mL Intravenous PRN Victorino December, MD   10 mL at 03/27/12 1627    Health Maintenance:  ColonoscopyShe has never had a colonoscopy Bone DensityBone density about 10-12 years ago Last PAP smearLast Pap smear was in 2012   REVIEW OF SYSTEMS: General: fatigue (-), night sweats (-), fever (-), pain (-) Lymph: palpable nodes (-) HEENT: vision changes (-), mucositis (-), gum bleeding (-), epistaxis (-) Cardiovascular: chest pain (-), palpitations  (-) Pulmonary: shortness of breath (-), dyspnea on exertion (-), cough (-), hemoptysis (-) GI:  Early satiety (-), melena (-), dysphagia (-), nausea/vomiting (-), diarrhea (-) GU: dysuria (-), hematuria (-), incontinence (-) Musculoskeletal: joint swelling (-), joint pain (-), back pain (-) Neuro: weakness (-), numbness (-), headache (-), confusion (-) Skin: Rash (-), lesions (-), dryness (-) Psych: depression (-), suicidal/homicidal ideation (-), feeling of hopelessness (-)   PHYSICAL EXAMINATION: Blood pressure 126/74, pulse 86, temperature 99 F (37.2 C), temperature source Oral, resp. rate 20, height 5\' 1"  (1.549 m), weight  191 lb 4.8 oz (86.773 kg). General: Patient is a well appearing female in no acute distress HEENT: PERRLA, sclerae anicteric no conjunctival pallor, MMM Neck: supple, no palpable adenopathy Lungs: clear to auscultation bilaterally, no wheezes, rhonchi, or rales Cardiovascular: regular rate rhythm, S1, S2, no murmurs, rubs or gallops Abdomen: Soft, non-tender, non-distended, normoactive bowel sounds, no HSM Extremities: warm and well perfused, no clubbing, cyanosis, or edema Skin: No rashes or lesions Neuro: Non-focal ECOG PERFORMANCE STATUS: 1 - Symptomatic but completely ambulatory Breasts: unable to palpate left breast mass, no nodularity, right breast: no masses or nodularity STUDIES/RESULTS: No results found.   LABS:    Chemistry      Component Value Date/Time   NA 136 03/27/2012 1231   K 3.4* 03/27/2012 1231   CL 96* 03/27/2012 1231   CO2 30* 03/27/2012 1231   BUN 19.0 03/27/2012 1231   CREATININE 0.9 03/27/2012 1231      Component Value Date/Time   CALCIUM 8.9 03/27/2012 1231   ALKPHOS 80 03/27/2012 1231   AST 31 03/27/2012 1231   ALT 70* 03/27/2012 1231   BILITOT 1.43* 03/27/2012 1231      Lab Results  Component Value Date   WBC 1.4* 03/27/2012   HGB 11.4* 03/27/2012   HCT 33.2* 03/27/2012   MCV 87.6 03/27/2012   PLT 96* 03/27/2012        PATHOLOGY:  ADDITIONAL INFORMATION: PROGNOSTIC INDICATORS - ACIS Results IMMUNOHISTOCHEMICAL AND MORPHOMETRIC ANALYSIS BY THE AUTOMATED CELLULAR IMAGING SYSTEM (ACIS) Estrogen Receptor (Negative, <1%): 0%, NEGATIVE Progesterone Receptor (Negative, <1%): 0%, NEGATIVE Proliferation Marker Ki67 by M IB-1 (Low<20%): 98% All controls stained appropriately Pecola Leisure MD Pathologist, Electronic Signature ( Signed 02/15/2012) CHROMOGENIC IN-SITU HYBRIDIZATION Interpretation: HER2/NEU BY CISH - SHOWS AMPLIFICATION BY CISH ANALYSIS. THE RATIO OF HER2: CEP 17 SIGNALS WAS 2.55. 40 TUMOR CELLS WERE SCORED. OF NOTE, THE TUMOR APPEARS TO SHOW HETEROGENEITY IN REGARDS TO HER2 EXPRESSION. Reference range: Ratio: HER2:CEP17 < 1.8 gene amplification not observed Ratio: HER2:CEP 17 1.8-2.2 - equivocal result Ratio: HER2:CEP17 > 2.2 - gene amplification observed Pecola Leisure MD Pathologist, Electronic Signature ( Signed 02/15/2012) 1 of ADDITIONAL INFORMATION: PROGNOSTIC INDICATORS - ACIS Results IMMUNOHISTOCHEMICAL AND MORPHOMETRIC ANALYSIS BY THE AUTOMATED CELLULAR IMAGING SYSTEM (ACIS) Estrogen Receptor (Negative, <1%): 0%, NEGATIVE Progesterone Receptor (Negative, <1%): 0%, NEGATIVE COMMENT: The negative hormone receptor study(ies) in this case have an internal positive control. All controls stained appropriately Abigail Miyamoto MD Pathologist, Electronic Signature ( Signed 02/22/2012)  ASSESSMENT #1 Stage 1 HER-2 positive left breast cancer ER negative PR negative.  Patient is being considered for neoadjuvant chemotherapy consisting of Taxotere carboplatinum and herceptin as she does want breast conservation.  She understands the risks and benefits of this approach and the side effects of her chemotherapy and Herceptin.  #2 She understands she will receive neulasta the day after chemotherapy to keep her white count up.    #3 She does have thrombocytopenia and we will monitor  this  #4 Mucositis  #5 Diarrhea/dehyrdation  #6 Neutropenia  PLAN:    #1Ms. Richwine is neutropenic from her chemotherapy.  Since she has accompanied gastroenteritis, I have prescribed Bactrim DS for both the dysuria and gastroenteritis.  She'll leave [place] a urine and stool specimen while receiving her IV fluids.  She will receive 1 liter of IV fluids today, 1 liter tomorrow along with Herceptin.  She was given detailed neutropenic instructions and counseling.   #2 She will continue using magic mouthwash, salt water rinses, and biotene  mouth rinses for her mucositis.   #3 We will see her back next week for Herceptin and interim labs.     All questions were answered. The patient knows to call the clinic with any problems, questions or concerns. We can certainly see the patient much sooner if necessary.  I spent 25 minutes counseling the patient face to face. The total time spent in the appointment was 30 minutes.  This case was reviewed by Dr. Welton Flakes.  Cherie Ouch Lyn Hollingshead, NP Medical Oncology [facility] Phone: [phone]  03/27/2012, 4:54 PM

## 2012-03-27 NOTE — Patient Instructions (Addendum)
[facility] Discharge Instructions for Patients Receiving Chemotherapy  Today you received IV Fluids  To help prevent nausea and vomiting after your treatment, we encourage you to take your nausea medication as prescribed.   If you develop nausea and vomiting that is not controlled by your nausea medication, call the clinic. If it is after clinic hours your family physician or the after hours number for the clinic or go to the Emergency Department.   BELOW ARE SYMPTOMS THAT SHOULD BE REPORTED IMMEDIATELY:  *FEVER GREATER THAN 100.5 F  *CHILLS WITH OR WITHOUT FEVER  NAUSEA AND VOMITING THAT IS NOT CONTROLLED WITH YOUR NAUSEA MEDICATION  *UNUSUAL SHORTNESS OF BREATH  *UNUSUAL BRUISING OR BLEEDING  TENDERNESS IN MOUTH AND THROAT WITH OR WITHOUT PRESENCE OF ULCERS  *URINARY PROBLEMS  *BOWEL PROBLEMS  UNUSUAL RASH Items with * indicate a potential emergency and should be followed up as soon as possible.

## 2012-03-27 NOTE — Patient Instructions (Addendum)
  Patient Neutropenia Instruction Sheet  Diagnosis: Breast Cancer      Treating Physician: Drue Second, MD  Treatment: 1. Type of chemotherapy: TCH  2. Date of last treatment: 03/21/12  Last Blood Counts: Lab Results  Component Value Date   WBC 1.4* 03/27/2012   HGB 11.4* 03/27/2012   HCT 33.2* 03/27/2012   MCV 87.6 03/27/2012   PLT 96* 03/27/2012  ANC 200      Prophylactic Antibiotics: Bactrim DS one tablet by mouth twice a day Instructions: 1. Monitor temperature and call if fever  greater than 100.5, chills, shaking chills (rigors) 2. Call Physician on-call at [phone] 3. Give him/her symptoms and list of medications that you are taking and your last blood count.   You will receive IV fluids today and tomorrow.  I will see you back on 04/04/12.

## 2012-03-28 ENCOUNTER — Ambulatory Visit (HOSPITAL_BASED_OUTPATIENT_CLINIC_OR_DEPARTMENT_OTHER): Payer: BC Managed Care – PPO

## 2012-03-28 ENCOUNTER — Ambulatory Visit: Payer: BC Managed Care – PPO | Admitting: Adult Health

## 2012-03-28 ENCOUNTER — Other Ambulatory Visit: Payer: BC Managed Care – PPO | Admitting: Lab

## 2012-03-28 VITALS — BP 120/71 | HR 82 | Temp 98.9°F

## 2012-03-28 DIAGNOSIS — Z5112 Encounter for antineoplastic immunotherapy: Secondary | ICD-10-CM

## 2012-03-28 DIAGNOSIS — C50419 Malignant neoplasm of upper-outer quadrant of unspecified female breast: Secondary | ICD-10-CM

## 2012-03-28 DIAGNOSIS — E860 Dehydration: Secondary | ICD-10-CM

## 2012-03-28 LAB — URINALYSIS, MICROSCOPIC - CHCC
Bilirubin (Urine): NEGATIVE
Blood: NEGATIVE
Leukocyte Esterase: NEGATIVE
Protein: NEGATIVE mg/dL
pH: 7 (ref 4.6–8.0)

## 2012-03-28 MED ORDER — SODIUM CHLORIDE 0.9 % IV SOLN
Freq: Once | INTRAVENOUS | Status: AC
  Administered 2012-03-28: 09:00:00 via INTRAVENOUS

## 2012-03-28 MED ORDER — DIPHENHYDRAMINE HCL 25 MG PO CAPS
50.0000 mg | ORAL_CAPSULE | Freq: Once | ORAL | Status: AC
Start: 2012-03-28 — End: 2012-03-28
  Administered 2012-03-28: 50 mg via ORAL

## 2012-03-28 MED ORDER — HEPARIN SOD (PORK) LOCK FLUSH 100 UNIT/ML IV SOLN
500.0000 [IU] | Freq: Once | INTRAVENOUS | Status: AC | PRN
  Administered 2012-03-28: 500 [IU]
  Filled 2012-03-28: qty 5

## 2012-03-28 MED ORDER — TRASTUZUMAB CHEMO INJECTION 440 MG
2.0000 mg/kg | Freq: Once | INTRAVENOUS | Status: AC
  Administered 2012-03-28: 189 mg via INTRAVENOUS
  Filled 2012-03-28: qty 9

## 2012-03-28 MED ORDER — SODIUM CHLORIDE 0.9 % IJ SOLN
10.0000 mL | INTRAMUSCULAR | Status: DC | PRN
  Administered 2012-03-28: 10 mL
  Filled 2012-03-28: qty 10

## 2012-03-28 MED ORDER — ACETAMINOPHEN 325 MG PO TABS
650.0000 mg | ORAL_TABLET | Freq: Once | ORAL | Status: AC
  Administered 2012-03-28: 650 mg via ORAL

## 2012-03-28 NOTE — Patient Instructions (Addendum)
[facility] Discharge Instructions for Patients Receiving Chemotherapy  Today you received the following chemotherapy agents Herceptin  To help prevent nausea and vomiting after your treatment, we encourage you to take your nausea medication    If you develop nausea and vomiting that is not controlled by your nausea medication, call the clinic. If it is after clinic hours your family physician or the after hours number for the clinic or go to the Emergency Department.   BELOW ARE SYMPTOMS THAT SHOULD BE REPORTED IMMEDIATELY:  *FEVER GREATER THAN 100.5 F  *CHILLS WITH OR WITHOUT FEVER  NAUSEA AND VOMITING THAT IS NOT CONTROLLED WITH YOUR NAUSEA MEDICATION  *UNUSUAL SHORTNESS OF BREATH  *UNUSUAL BRUISING OR BLEEDING  TENDERNESS IN MOUTH AND THROAT WITH OR WITHOUT PRESENCE OF ULCERS  *URINARY PROBLEMS  *BOWEL PROBLEMS  UNUSUAL RASH Items with * indicate a potential emergency and should be followed up as soon as possible.  Feel free to call the clinic you have any questions or concerns. The clinic phone number is [phone].   I have been informed and understand all the instructions given to me. I know to contact the clinic, my physician, or go to the Emergency Department if any problems should occur. I do not have any questions at this time, but understand that I may call the clinic during office hours   should I have any questions or need assistance in obtaining follow up care.    __________________________________________  _____________  __________ Signature of Patient or Authorized Representative            Date                   Time    __________________________________________ Nurse's Signature   Dehydration, Adult Dehydration is when you lose more fluids from the body than you take in. Vital organs like the kidneys, brain, and heart cannot function without a proper amount of fluids and salt. Any loss of fluids from the body can cause dehydration.    CAUSES   Vomiting.  Diarrhea.  Excessive sweating.  Excessive urine output.  Fever. SYMPTOMS  Mild dehydration  Thirst.  Dry lips.  Slightly dry mouth. Moderate dehydration  Very dry mouth.  Sunken eyes.  Skin does not bounce back quickly when lightly pinched and released.  Dark urine and decreased urine production.  Decreased tear production.  Headache. Severe dehydration  Very dry mouth.  Extreme thirst.  Rapid, weak pulse (more than 100 beats per minute at rest).  Cold hands and feet.  Not able to sweat in spite of heat and temperature.  Rapid breathing.  Blue lips.  Confusion and lethargy.  Difficulty being awakened.  Minimal urine production.  No tears. DIAGNOSIS  Your caregiver will diagnose dehydration based on your symptoms and your exam. Blood and urine tests will help confirm the diagnosis. The diagnostic evaluation should also identify the cause of dehydration. TREATMENT  Treatment of mild or moderate dehydration can often be done at home by increasing the amount of fluids that you drink. It is best to drink small amounts of fluid more often. Drinking too much at one time can make vomiting worse. Refer to the home care instructions below. Severe dehydration needs to be treated at the hospital where you will probably be given intravenous (IV) fluids that contain water and electrolytes. HOME CARE INSTRUCTIONS   Ask your caregiver about specific rehydration instructions.  Drink enough fluids to keep your urine clear or pale yellow.  Drink small amounts frequently if you have nausea and vomiting.  Eat as you normally do.  Avoid:  Foods or drinks high in sugar.  Carbonated drinks.  Juice.  Extremely hot or cold fluids.  Drinks with caffeine.  Fatty, greasy foods.  Alcohol.  Tobacco.  Overeating.  Gelatin desserts.  Wash your hands well to avoid spreading bacteria and viruses.  Only take over-the-counter or  prescription medicines for pain, discomfort, or fever as directed by your caregiver.  Ask your caregiver if you should continue all prescribed and over-the-counter medicines.  Keep all follow-up appointments with your caregiver. SEEK MEDICAL CARE IF:  You have abdominal pain and it increases or stays in one area (localizes).  You have a rash, stiff neck, or severe headache.  You are irritable, sleepy, or difficult to awaken.  You are weak, dizzy, or extremely thirsty. SEEK IMMEDIATE MEDICAL CARE IF:   You are unable to keep fluids down or you get worse despite treatment.  You have frequent episodes of vomiting or diarrhea.  You have blood or green matter (bile) in your vomit.  You have blood in your stool or your stool looks black and tarry.  You have not urinated in 6 to 8 hours, or you have only urinated a small amount of very dark urine.  You have a fever.  You faint. MAKE SURE YOU:   Understand these instructions.  Will watch your condition.  Will get help right away if you are not doing well or get worse. Document Released: 04/18/2005 Document Revised: 07/11/2011 Document Reviewed: 12/06/2010 [facility] Patient Information 2013 [place], [place].

## 2012-03-29 LAB — URINE CULTURE

## 2012-03-30 ENCOUNTER — Other Ambulatory Visit: Payer: Self-pay | Admitting: Emergency Medicine

## 2012-03-30 ENCOUNTER — Telehealth: Payer: Self-pay | Admitting: *Deleted

## 2012-03-30 ENCOUNTER — Encounter: Payer: Self-pay | Admitting: Oncology

## 2012-03-30 ENCOUNTER — Telehealth: Payer: Self-pay | Admitting: Emergency Medicine

## 2012-03-30 ENCOUNTER — Ambulatory Visit: Payer: BC Managed Care – PPO

## 2012-03-30 ENCOUNTER — Ambulatory Visit (HOSPITAL_BASED_OUTPATIENT_CLINIC_OR_DEPARTMENT_OTHER): Payer: BC Managed Care – PPO | Admitting: Oncology

## 2012-03-30 ENCOUNTER — Ambulatory Visit (HOSPITAL_BASED_OUTPATIENT_CLINIC_OR_DEPARTMENT_OTHER): Payer: BC Managed Care – PPO

## 2012-03-30 ENCOUNTER — Other Ambulatory Visit: Payer: BC Managed Care – PPO | Admitting: Lab

## 2012-03-30 VITALS — BP 129/72 | HR 92 | Temp 98.3°F | Resp 20 | Ht 61.0 in | Wt 192.0 lb

## 2012-03-30 DIAGNOSIS — C50419 Malignant neoplasm of upper-outer quadrant of unspecified female breast: Secondary | ICD-10-CM

## 2012-03-30 DIAGNOSIS — D696 Thrombocytopenia, unspecified: Secondary | ICD-10-CM

## 2012-03-30 DIAGNOSIS — D709 Neutropenia, unspecified: Secondary | ICD-10-CM

## 2012-03-30 DIAGNOSIS — E860 Dehydration: Secondary | ICD-10-CM

## 2012-03-30 LAB — CBC WITH DIFFERENTIAL/PLATELET
BASO%: 0.2 % (ref 0.0–2.0)
HCT: 28.9 % — ABNORMAL LOW (ref 34.8–46.6)
HGB: 9.9 g/dL — ABNORMAL LOW (ref 11.6–15.9)
MCHC: 34.3 g/dL (ref 31.5–36.0)
MONO#: 0.3 10*3/uL (ref 0.1–0.9)
NEUT%: 71.3 % (ref 38.4–76.8)
RDW: 15.4 % — ABNORMAL HIGH (ref 11.2–14.5)
WBC: 4.1 10*3/uL (ref 3.9–10.3)
lymph#: 0.8 10*3/uL — ABNORMAL LOW (ref 0.9–3.3)

## 2012-03-30 LAB — COMPREHENSIVE METABOLIC PANEL (CC13)
ALT: 31 U/L (ref 0–55)
AST: 18 U/L (ref 5–34)
Alkaline Phosphatase: 79 U/L (ref 40–150)
BUN: 14 mg/dL (ref 7.0–26.0)
Chloride: 97 mEq/L — ABNORMAL LOW (ref 98–107)
Creatinine: 1.3 mg/dL — ABNORMAL HIGH (ref 0.6–1.1)
Potassium: 3.8 mEq/L (ref 3.5–5.1)

## 2012-03-30 MED ORDER — SODIUM CHLORIDE 0.9 % IV SOLN
Freq: Once | INTRAVENOUS | Status: AC
  Administered 2012-03-30: 16:00:00 via INTRAVENOUS

## 2012-03-30 NOTE — Patient Instructions (Signed)
Dehydration, Adult Dehydration is when you lose more fluids from the body than you take in. Vital organs like the kidneys, brain, and heart cannot function without a proper amount of fluids and salt. Any loss of fluids from the body can cause dehydration.  CAUSES   Vomiting.  Diarrhea.  Excessive sweating.  Excessive urine output.  Fever. SYMPTOMS  Mild dehydration  Thirst.  Dry lips.  Slightly dry mouth. Moderate dehydration  Very dry mouth.  Sunken eyes.  Skin does not bounce back quickly when lightly pinched and released.  Dark urine and decreased urine production.  Decreased tear production.  Headache. Severe dehydration  Very dry mouth.  Extreme thirst.  Rapid, weak pulse (more than 100 beats per minute at rest).  Cold hands and feet.  Not able to sweat in spite of heat and temperature.  Rapid breathing.  Blue lips.  Confusion and lethargy.  Difficulty being awakened.  Minimal urine production.  No tears. DIAGNOSIS  Your caregiver will diagnose dehydration based on your symptoms and your exam. Blood and urine tests will help confirm the diagnosis. The diagnostic evaluation should also identify the cause of dehydration. TREATMENT  Treatment of mild or moderate dehydration can often be done at home by increasing the amount of fluids that you drink. It is best to drink small amounts of fluid more often. Drinking too much at one time can make vomiting worse. Refer to the home care instructions below. Severe dehydration needs to be treated at the hospital where you will probably be given intravenous (IV) fluids that contain water and electrolytes. HOME CARE INSTRUCTIONS   Ask your caregiver about specific rehydration instructions.  Drink enough fluids to keep your urine clear or pale yellow.  Drink small amounts frequently if you have nausea and vomiting.  Eat as you normally do.  Avoid:  Foods or drinks high in sugar.  Carbonated  drinks.  Juice.  Extremely hot or cold fluids.  Drinks with caffeine.  Fatty, greasy foods.  Alcohol.  Tobacco.  Overeating.  Gelatin desserts.  Wash your hands well to avoid spreading bacteria and viruses.  Only take over-the-counter or prescription medicines for pain, discomfort, or fever as directed by your caregiver.  Ask your caregiver if you should continue all prescribed and over-the-counter medicines.  Keep all follow-up appointments with your caregiver. SEEK MEDICAL CARE IF:  You have abdominal pain and it increases or stays in one area (localizes).  You have a rash, stiff neck, or severe headache.  You are irritable, sleepy, or difficult to awaken.  You are weak, dizzy, or extremely thirsty. SEEK IMMEDIATE MEDICAL CARE IF:   You are unable to keep fluids down or you get worse despite treatment.  You have frequent episodes of vomiting or diarrhea.  You have blood or green matter (bile) in your vomit.  You have blood in your stool or your stool looks black and tarry.  You have not urinated in 6 to 8 hours, or you have only urinated a small amount of very dark urine.  You have a fever.  You faint. MAKE SURE YOU:   Understand these instructions.  Will watch your condition.  Will get help right away if you are not doing well or get worse. Document Released: 04/18/2005 Document Revised: 07/11/2011 Document Reviewed: 12/06/2010 Exit[employer] Patient Information 2013 [employer].  

## 2012-03-30 NOTE — Patient Instructions (Addendum)
Proceed with IVF today  Stop Bactrim  Use Aquaphor for skin of the face and arms/legs and trunk  Keep you usual scheduled appointment

## 2012-03-30 NOTE — Telephone Encounter (Signed)
PER THE DESK NURSE

## 2012-03-30 NOTE — Telephone Encounter (Signed)
See if she wants to come in and be seen before 2:00

## 2012-03-30 NOTE — Telephone Encounter (Signed)
Patient called to report that after starting the Bactrim she has woken up this morning with "red spots" on her face.

## 2012-03-30 NOTE — Progress Notes (Signed)
Maria Mckinney [id number] July 12, 1950 61 y.o. 03/30/2012 3:07 PM  CC  [name], MD Dr. [name] [address] [place] [address] Dr. [name] Dr. [name] Dr. 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  PRIOR THERAPY:  #1Patient was originally seen in the multidisciplinary breast clinic for new diagnosis of left breast cancer that was 8mm in the upper outer quadrant of the left breast.  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  There was an additional 4 cm area of normal signal.  Patient did not get an MRI secondary to cochlear implant.  She desires breast conservation.    #2 Patient is s/p port-a-cath placement for neoadjuvant chemotherapy consisting of Taxotere/carboplatinum/Herceptin.  She will begin cycle #1 on 02/29/2012.  She will receive Taxotere and carboplatinum every 21 days with Herceptin giving weekly.  She will receive a total of 4 cycles of Taxotere and Carboplatinum.    CURRENT THERAPY: C2 day 7 TCH with Neulasta support, (weekly herceptin)  INTERVAL HISTORY: Maria Mckinney is feeling very weak today. She called [place] today because she had developed a rash after beginning Bactrim. She also is tired and very weak. Her urine is very concentrated she occasionally now has diarrheal stools. CBC was performed today and her counts are normal no. She is denying any nausea or vomiting currently.   Past Medical History: Past Medical History  Diagnosis Date  . HTN (hypertension)   . Fibromyalgia   . GERD (gastroesophageal reflux disease)   . Arthritis   . Anxiety   . Dysrhythmia     irreg hr  . Wears hearing aid     left ear  . 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 PONV (postoperative nausea and vomiting)     Past Surgical History: Past Surgical History    Procedure Date  . Appendectomy   . Combined hysteroscopy diagnostic / d&c   . Cochlear implant   . Portacath placement 02/22/2012    Procedure: INSERTION PORT-A-CATH;  Surgeon: [name], MD;  Location: [facility];  Service: General;  Laterality: Right;    Family History: Family History  Problem Relation Age of Onset  . Cancer Mother     "tumor of face removed"  . Cancer Father     Mckinney removed  . Cancer Maternal Aunt     stomach & Pancreatic  . Cancer Paternal Uncle     pancreatic    Social History History  Substance Use Topics  . Smoking status: Never Smoker   . Smokeless tobacco: Not on file  . Alcohol Use: 0.0 oz/week    0-1 Glasses of wine per week    Allergies: Allergies  Allergen Reactions  . Acyclovir And Related   . Ciprofloxacin Hcl Rash    Current Medications: Current Outpatient Prescriptions  Medication Sig Dispense Refill  . Alum & Mag Hydroxide-Simeth (MAGIC MOUTHWASH W/LIDOCAINE) SOLN Take 5 mLs by mouth 4 (four) times daily.  200 mL  6  . bisoprolol-hydrochlorothiazide (ZIAC) 5-6.25 MG per tablet Take 1 tablet by mouth daily.      . cyclobenzaprine (FLEXERIL) 10 MG tablet Take 10 mg by mouth 3 (three) times daily as needed.      . DULoxetine (CYMBALTA) 60 MG capsule Take 60 mg by mouth daily.      Marland Kitchen HYDROcodone-acetaminophen (  VICODIN) 5-500 MG per tablet Take 1 tablet by mouth every 6 (six) hours as needed.  60 tablet  0  . levothyroxine (SYNTHROID, LEVOTHROID) 100 MCG tablet Take 100 mcg by mouth daily.      Marland Kitchen lidocaine-prilocaine (EMLA) cream Apply topically as needed.  30 g  4  . LORazepam (ATIVAN) 0.5 MG tablet Take 1 tablet (0.5 mg total) by mouth every 6 (six) hours as needed (Nausea or vomiting).  30 tablet  0  . meloxicam (MOBIC) 15 MG tablet Take 15 mg by mouth daily.      . mometasone (NASONEX) 50 MCG/ACT nasal spray Place 2 sprays into the nose daily.      . ondansetron (ZOFRAN) 8 MG tablet Take 1 tablet (8 mg  total) by mouth 2 (two) times daily. Take two times a day starting the day after chemo for 3 days. Then take two times a day as needed for nausea or vomiting.  30 tablet  1  . potassium chloride SA (K-DUR,KLOR-CON) 20 MEQ tablet Pt is to take 40 MEQ (2 tablets) daily  60 tablet  0  . prochlorperazine (COMPAZINE) 10 MG tablet Take 1 tablet (10 mg total) by mouth every 6 (six) hours as needed (Nausea or vomiting).  30 tablet  1  . prochlorperazine (COMPAZINE) 25 MG suppository Place 1 suppository (25 mg total) rectally every 12 (twelve) hours as needed for nausea.  12 suppository  3  . ranitidine (ZANTAC) 300 MG tablet Take 300 mg by mouth at bedtime.      . sulfamethoxazole-trimethoprim (BACTRIM DS) 800-160 MG per tablet Take 1 tablet by mouth 2 (two) times daily.  14 tablet  0  . UNABLE TO FIND Cranial Prothesis  1 each  0  . dexamethasone (DECADRON) 4 MG tablet Take 2 tablets (8 mg total) by mouth 2 (two) times daily with a meal. Take two times a day the day before Taxotere. Then take two times a day starting the day after chemo for 3 days.  30 tablet  1    Health Maintenance:  ColonoscopyShe has never had a colonoscopy Bone DensityBone density about 10-12 years ago Last PAP smearLast Pap smear was in 2012   REVIEW OF SYSTEMS: General: fatigue (-), night sweats (-), fever (-), pain (-) Lymph: palpable nodes (-) HEENT: vision changes (-), mucositis (-), gum bleeding (-), epistaxis (-) Cardiovascular: chest pain (-), palpitations (-) Pulmonary: shortness of breath (-), dyspnea on exertion (-), cough (-), hemoptysis (-) GI:  Early satiety (-), melena (-), dysphagia (-), nausea/vomiting (-), diarrhea (-) GU: dysuria (-), hematuria (-), incontinence (-) Musculoskeletal: joint swelling (-), joint pain (-), back pain (-) Neuro: weakness (-), numbness (-), headache (-), confusion (-) Skin: Rash (-), lesions (-), dryness (-) Psych: depression (-), suicidal/homicidal ideation (-), feeling of  hopelessness (-)   PHYSICAL EXAMINATION: Blood pressure 129/72, pulse 92, temperature 98.3 F (36.8 C), temperature source Oral, resp. rate 20, height 5\' 1"  (1.549 m), weight 192 lb (87.091 kg). General: Patient is a well appearing female in no acute distress HEENT: PERRLA, sclerae anicteric no conjunctival pallor, MMM Neck: supple, no palpable adenopathy Lungs: clear to auscultation bilaterally, no wheezes, rhonchi, or rales Cardiovascular: regular rate rhythm, S1, S2, no murmurs, rubs or gallops Abdomen: Soft, non-tender, non-distended, normoactive bowel sounds, no HSM Extremities: warm and well perfused, no clubbing, cyanosis, or edema Skin: No rashes or lesions Neuro: Non-focal ECOG PERFORMANCE STATUS: 1 - Symptomatic but completely ambulatory Breasts: unable to palpate left breast mass,  no nodularity, right breast: no masses or nodularity STUDIES/RESULTS: No results found.   LABS:    Chemistry      Component Value Date/Time   NA 136 03/27/2012 1231   K 3.4* 03/27/2012 1231   CL 96* 03/27/2012 1231   CO2 30* 03/27/2012 1231   BUN 19.0 03/27/2012 1231   CREATININE 0.9 03/27/2012 1231      Component Value Date/Time   CALCIUM 8.9 03/27/2012 1231   ALKPHOS 80 03/27/2012 1231   AST 31 03/27/2012 1231   ALT 70* 03/27/2012 1231   BILITOT 1.43* 03/27/2012 1231      Lab Results  Component Value Date   WBC 4.1 03/30/2012   HGB 9.9* 03/30/2012   HCT 28.9* 03/30/2012   MCV 87.8 03/30/2012   PLT 95* 03/30/2012       PATHOLOGY:  ADDITIONAL INFORMATION: PROGNOSTIC INDICATORS - ACIS Results IMMUNOHISTOCHEMICAL AND MORPHOMETRIC ANALYSIS BY THE AUTOMATED CELLULAR IMAGING SYSTEM (ACIS) Estrogen Receptor (Negative, <1%): 0%, NEGATIVE Progesterone Receptor (Negative, <1%): 0%, NEGATIVE Proliferation Marker Ki67 by M IB-1 (Low<20%): 98% All controls stained appropriately Pecola Leisure MD Pathologist, Electronic Signature ( Signed 02/15/2012) CHROMOGENIC IN-SITU  HYBRIDIZATION Interpretation: HER2/NEU BY CISH - SHOWS AMPLIFICATION BY CISH ANALYSIS. THE RATIO OF HER2: CEP 17 SIGNALS WAS 2.55. 40 TUMOR CELLS WERE SCORED. OF NOTE, THE TUMOR APPEARS TO SHOW HETEROGENEITY IN REGARDS TO HER2 EXPRESSION. Reference range: Ratio: HER2:CEP17 < 1.8 gene amplification not observed Ratio: HER2:CEP 17 1.8-2.2 - equivocal result Ratio: HER2:CEP17 > 2.2 - gene amplification observed Pecola Leisure MD Pathologist, Electronic Signature ( Signed 02/15/2012) 1 of ADDITIONAL INFORMATION: PROGNOSTIC INDICATORS - ACIS Results IMMUNOHISTOCHEMICAL AND MORPHOMETRIC ANALYSIS BY THE AUTOMATED CELLULAR IMAGING SYSTEM (ACIS) Estrogen Receptor (Negative, <1%): 0%, NEGATIVE Progesterone Receptor (Negative, <1%): 0%, NEGATIVE COMMENT: The negative hormone receptor study(ies) in this case have an internal positive control. All controls stained appropriately Abigail Miyamoto MD Pathologist, Electronic Signature ( Signed 02/22/2012)  ASSESSMENT #1 Stage 1 HER-2 positive left breast cancer ER negative PR negative.  Patient is being considered for neoadjuvant chemotherapy consisting of Taxotere carboplatinum and herceptin as she does want breast conservation.  She understands the risks and benefits of this approach and the side effects of her chemotherapy and Herceptin.  #2 She understands she will receive neulasta the day after chemotherapy to keep her white count up.    #3 She does have thrombocytopenia and we will monitor this  #4 Mucositis  #5 Diarrhea/dehyrdation  #6 Neutropenia  PLAN:    #1 facial rash most likely due to the recent antibiotic that she was placed on for her neutropenia. She was given Bactrim. We'll now discontinue this.  #2 she will IV fluids today since she is is dehydrated and is not drinking as much as she is recommended.  #3 We will see her back next week for Herceptin and interim labs.     All questions were answered. The patient knows to call  the clinic with any problems, questions or concerns. We can certainly see the patient much sooner if necessary.  I spent 25 minutes counseling the patient face to face. The total time spent in the appointment was 30 minutes.   Drue Second, MD [facility] Phone: [phone]  03/30/2012, 3:07 PM

## 2012-03-30 NOTE — Telephone Encounter (Signed)
Fluids add on for 03-30-2012

## 2012-04-02 LAB — CLOSTRIDIUM DIFFICILE EIA

## 2012-04-03 ENCOUNTER — Other Ambulatory Visit: Payer: BC Managed Care – PPO | Admitting: Lab

## 2012-04-04 ENCOUNTER — Encounter: Payer: Self-pay | Admitting: Adult Health

## 2012-04-04 ENCOUNTER — Ambulatory Visit (HOSPITAL_BASED_OUTPATIENT_CLINIC_OR_DEPARTMENT_OTHER): Payer: BC Managed Care – PPO

## 2012-04-04 ENCOUNTER — Other Ambulatory Visit: Payer: Self-pay | Admitting: Medical Oncology

## 2012-04-04 ENCOUNTER — Ambulatory Visit (HOSPITAL_BASED_OUTPATIENT_CLINIC_OR_DEPARTMENT_OTHER): Payer: BC Managed Care – PPO | Admitting: Adult Health

## 2012-04-04 ENCOUNTER — Other Ambulatory Visit (HOSPITAL_BASED_OUTPATIENT_CLINIC_OR_DEPARTMENT_OTHER): Payer: BC Managed Care – PPO | Admitting: Lab

## 2012-04-04 VITALS — BP 119/61 | HR 79 | Temp 98.3°F | Resp 20 | Ht 61.0 in | Wt 192.0 lb

## 2012-04-04 DIAGNOSIS — C50419 Malignant neoplasm of upper-outer quadrant of unspecified female breast: Secondary | ICD-10-CM

## 2012-04-04 DIAGNOSIS — C50919 Malignant neoplasm of unspecified site of unspecified female breast: Secondary | ICD-10-CM

## 2012-04-04 DIAGNOSIS — Z171 Estrogen receptor negative status [ER-]: Secondary | ICD-10-CM

## 2012-04-04 DIAGNOSIS — Z5112 Encounter for antineoplastic immunotherapy: Secondary | ICD-10-CM

## 2012-04-04 DIAGNOSIS — E860 Dehydration: Secondary | ICD-10-CM

## 2012-04-04 DIAGNOSIS — R110 Nausea: Secondary | ICD-10-CM

## 2012-04-04 LAB — COMPREHENSIVE METABOLIC PANEL (CC13)
Alkaline Phosphatase: 89 U/L (ref 40–150)
BUN: 8 mg/dL (ref 7.0–26.0)
CO2: 30 mEq/L — ABNORMAL HIGH (ref 22–29)
Glucose: 141 mg/dl — ABNORMAL HIGH (ref 70–99)
Sodium: 136 mEq/L (ref 136–145)
Total Bilirubin: 0.63 mg/dL (ref 0.20–1.20)
Total Protein: 5.8 g/dL — ABNORMAL LOW (ref 6.4–8.3)

## 2012-04-04 LAB — CBC WITH DIFFERENTIAL/PLATELET
BASO%: 0.2 % (ref 0.0–2.0)
Basophils Absolute: 0 10*3/uL (ref 0.0–0.1)
EOS%: 0 % (ref 0.0–7.0)
HCT: 27.5 % — ABNORMAL LOW (ref 34.8–46.6)
HGB: 9.3 g/dL — ABNORMAL LOW (ref 11.6–15.9)
LYMPH%: 22.1 % (ref 14.0–49.7)
MCH: 30.3 pg (ref 25.1–34.0)
MCHC: 33.8 g/dL (ref 31.5–36.0)
MCV: 89.6 fL (ref 79.5–101.0)
MONO%: 5.9 % (ref 0.0–14.0)
NEUT%: 71.8 % (ref 38.4–76.8)
Platelets: 87 10*3/uL — ABNORMAL LOW (ref 145–400)

## 2012-04-04 MED ORDER — SODIUM CHLORIDE 0.9 % IV SOLN
Freq: Once | INTRAVENOUS | Status: DC
  Administered 2012-04-04: 11:00:00 via INTRAVENOUS

## 2012-04-04 MED ORDER — DIPHENHYDRAMINE HCL 25 MG PO CAPS
50.0000 mg | ORAL_CAPSULE | Freq: Once | ORAL | Status: AC
  Administered 2012-04-04: 50 mg via ORAL

## 2012-04-04 MED ORDER — POTASSIUM CHLORIDE CRYS ER 20 MEQ PO TBCR: EXTENDED_RELEASE_TABLET | ORAL | Status: DC

## 2012-04-04 MED ORDER — SODIUM CHLORIDE 0.9 % IJ SOLN
10.0000 mL | INTRAMUSCULAR | Status: DC | PRN
  Administered 2012-04-04: 10 mL
  Filled 2012-04-04: qty 10

## 2012-04-04 MED ORDER — ONDANSETRON 8 MG/50ML IVPB (CHCC)
8.0000 mg | Freq: Once | INTRAVENOUS | Status: DC
  Administered 2012-04-04: 8 mg via INTRAVENOUS

## 2012-04-04 MED ORDER — TRASTUZUMAB CHEMO INJECTION 440 MG
2.0000 mg/kg | Freq: Once | INTRAVENOUS | Status: AC
  Administered 2012-04-04: 189 mg via INTRAVENOUS
  Filled 2012-04-04: qty 9

## 2012-04-04 MED ORDER — ACETAMINOPHEN 325 MG PO TABS
650.0000 mg | ORAL_TABLET | Freq: Once | ORAL | Status: AC
  Administered 2012-04-04: 650 mg via ORAL

## 2012-04-04 MED ORDER — HEPARIN SOD (PORK) LOCK FLUSH 100 UNIT/ML IV SOLN
500.0000 [IU] | Freq: Once | INTRAVENOUS | Status: AC | PRN
  Administered 2012-04-04: 500 [IU]
  Filled 2012-04-04: qty 5

## 2012-04-04 MED ORDER — SODIUM CHLORIDE 0.9 % IV SOLN
Freq: Once | INTRAVENOUS | Status: AC
  Administered 2012-04-04: 11:00:00 via INTRAVENOUS

## 2012-04-04 NOTE — Telephone Encounter (Signed)
Called and left VMOM for patient to take KDUR 20 meq PO daily x 1 week, per MD. Prescription sent to pharmacy and informed patient that prescription has been sent to pharmacy. Asked patient to please call back to confirm that she has received phone message.

## 2012-04-04 NOTE — Patient Instructions (Addendum)
Doing well.  Proceed with Herceptin.  Try putting hydrocortisone cream for itching, and take a benadryl if needed.  We will give you IV fluids and Zofran today with your Herceptin.  Please call us if you have any questions or concerns.

## 2012-04-04 NOTE — Patient Instructions (Addendum)
[facility] Discharge Instructions for Patients Receiving Chemotherapy  Today you received the following chemotherapy agents Herceptin  To help prevent nausea and vomiting after your treatment, we encourage you to take your nausea medication as prescribed.   If you develop nausea and vomiting that is not controlled by your nausea medication, call the clinic. If it is after clinic hours your family physician or the after hours number for the clinic or go to the Emergency Department.   BELOW ARE SYMPTOMS THAT SHOULD BE REPORTED IMMEDIATELY:  *FEVER GREATER THAN 100.5 F  *CHILLS WITH OR WITHOUT FEVER  NAUSEA AND VOMITING THAT IS NOT CONTROLLED WITH YOUR NAUSEA MEDICATION  *UNUSUAL SHORTNESS OF BREATH  *UNUSUAL BRUISING OR BLEEDING  TENDERNESS IN MOUTH AND THROAT WITH OR WITHOUT PRESENCE OF ULCERS  *URINARY PROBLEMS  *BOWEL PROBLEMS  UNUSUAL RASH Items with * indicate a potential emergency and should be followed up as soon as possible.   Feel free to call the clinic you have any questions or concerns. The clinic phone number is [phone].   I have been informed and understand all the instructions given to me. I know to contact the clinic, my physician, or go to the Emergency Department if any problems should occur. I do not have any questions at this time, but understand that I may call the clinic during office hours   should I have any questions or need assistance in obtaining follow up care.    __________________________________________  _____________  __________ Signature of Patient or Authorized Representative            Date                   Time   Dehydration, Adult Dehydration is when you lose more fluids from the body than you take in. Vital organs like the kidneys, brain, and heart cannot function without a proper amount of fluids and salt. Any loss of fluids from the body can cause dehydration.  CAUSES   Vomiting.  Diarrhea.  Excessive  sweating.  Excessive urine output.  Fever. SYMPTOMS  Mild dehydration  Thirst.  Dry lips.  Slightly dry mouth. Moderate dehydration  Very dry mouth.  Sunken eyes.  Skin does not bounce back quickly when lightly pinched and released.  Dark urine and decreased urine production.  Decreased tear production.  Headache. Severe dehydration  Very dry mouth.  Extreme thirst.  Rapid, weak pulse (more than 100 beats per minute at rest).  Cold hands and feet.  Not able to sweat in spite of heat and temperature.  Rapid breathing.  Blue lips.  Confusion and lethargy.  Difficulty being awakened.  Minimal urine production.  No tears. DIAGNOSIS  Your caregiver will diagnose dehydration based on your symptoms and your exam. Blood and urine tests will help confirm the diagnosis. The diagnostic evaluation should also identify the cause of dehydration. TREATMENT  Treatment of mild or moderate dehydration can often be done at home by increasing the amount of fluids that you drink. It is best to drink small amounts of fluid more often. Drinking too much at one time can make vomiting worse. Refer to the home care instructions below. Severe dehydration needs to be treated at the hospital where you will probably be given intravenous (IV) fluids that contain water and electrolytes. HOME CARE INSTRUCTIONS   Ask your caregiver about specific rehydration instructions.  Drink enough fluids to keep your urine clear or pale yellow.  Drink small amounts frequently  if you have nausea and vomiting.  Eat as you normally do.  Avoid:  Foods or drinks high in sugar.  Carbonated drinks.  Juice.  Extremely hot or cold fluids.  Drinks with caffeine.  Fatty, greasy foods.  Alcohol.  Tobacco.  Overeating.  Gelatin desserts.  Wash your hands well to avoid spreading bacteria and viruses.  Only take over-the-counter or prescription medicines for pain, discomfort, or fever as  directed by your caregiver.  Ask your caregiver if you should continue all prescribed and over-the-counter medicines.  Keep all follow-up appointments with your caregiver. SEEK MEDICAL CARE IF:  You have abdominal pain and it increases or stays in one area (localizes).  You have a rash, stiff neck, or severe headache.  You are irritable, sleepy, or difficult to awaken.  You are weak, dizzy, or extremely thirsty. SEEK IMMEDIATE MEDICAL CARE IF:   You are unable to keep fluids down or you get worse despite treatment.  You have frequent episodes of vomiting or diarrhea.  You have blood or green matter (bile) in your vomit.  You have blood in your stool or your stool looks black and tarry.  You have not urinated in 6 to 8 hours, or you have only urinated a small amount of very dark urine.  You have a fever.  You faint. MAKE SURE YOU:   Understand these instructions.  Will watch your condition.  Will get help right away if you are not doing well or get worse. Document Released: 04/18/2005 Document Revised: 07/11/2011 Document Reviewed: 12/06/2010 ExitCare Patient Information 2013 [employer], [place].   __________________________________________ Nurse's Signature

## 2012-04-04 NOTE — Progress Notes (Signed)
Maria Mckinney [id number] 24-Apr-1951 61 y.o. 04/04/2012 10:01 AM  CC  Maria Blanks L, MD Dr. [name] [address] [place] [address] Dr. [name] Dr. [name] Dr. 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  PRIOR THERAPY:  #1Patient was originally seen in the multidisciplinary breast clinic for new diagnosis of left breast cancer that was 8mm in the upper outer quadrant of the left breast.  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  There was an additional 4 cm area of normal signal.  Patient did not get an MRI secondary to cochlear implant.  She desires breast conservation.    #2 Patient is s/p port-a-cath placement for neoadjuvant chemotherapy consisting of Taxotere/carboplatinum/Herceptin.  She will begin cycle #1 on 02/29/2012.  She will receive Taxotere and carboplatinum every 21 days with Herceptin giving weekly.  She will receive a total of 4 cycles of Taxotere and Carboplatinum.    CURRENT THERAPY: C2 day 15 TCH with Neulasta support, (weekly herceptin)  INTERVAL HISTORY: Maria Mckinney is doing well today.  She's here for her weekly Herceptin.  She says her facial rash is drying up, and she tried putting aquaphor on it which burned.  She's also been having about 4-5 waves of nausea per day.  She's not taking in a lot of fluids, and her diarrhea has stopped. She does have some mouth ulcerations, and has been using warm water rinses after meals which is helping.    Past Medical History: Past Medical History  Diagnosis Date  . HTN (hypertension)   . Fibromyalgia   . GERD (gastroesophageal reflux disease)   . Arthritis   . Anxiety   . Dysrhythmia     irreg hr  . Wears hearing aid     left ear  . 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 PONV (postoperative nausea and  vomiting)     Past Surgical History: Past Surgical History  Procedure Date  . Appendectomy   . Combined hysteroscopy diagnostic / d&c   . Cochlear implant   . Portacath placement 02/22/2012    Procedure: INSERTION PORT-A-CATH;  Surgeon: [name], MD;  Location: [facility];  Service: General;  Laterality: Right;    Family History: Family History  Problem Relation Age of Onset  . Cancer Mother     "tumor of face removed"  . Cancer Father     kidney removed  . Cancer Maternal Aunt     stomach & Pancreatic  . Cancer Paternal Uncle     pancreatic    Social History History  Substance Use Topics  . Smoking status: Never Smoker   . Smokeless tobacco: Not on file  . Alcohol Use: 0.0 oz/week    0-1 Glasses of wine per week    Allergies: Allergies  Allergen Reactions  . Acyclovir And Related   . Ciprofloxacin Hcl Rash    Current Medications: Current Outpatient Prescriptions  Medication Sig Dispense Refill  . Alum & Mag Hydroxide-Simeth (MAGIC MOUTHWASH W/LIDOCAINE) SOLN Take 5 mLs by mouth 4 (four) times daily.  200 mL  6  . bisoprolol-hydrochlorothiazide (ZIAC) 5-6.25 MG per tablet Take 1 tablet by mouth daily.      . cyclobenzaprine (FLEXERIL) 10 MG tablet Take 10 mg by mouth 3 (three) times daily as needed.      Marland Kitchen  dexamethasone (DECADRON) 4 MG tablet Take 2 tablets (8 mg total) by mouth 2 (two) times daily with a meal. Take two times a day the day before Taxotere. Then take two times a day starting the day after chemo for 3 days.  30 tablet  1  . DULoxetine (CYMBALTA) 60 MG capsule Take 60 mg by mouth daily.      Marland Kitchen HYDROcodone-acetaminophen (VICODIN) 5-500 MG per tablet Take 1 tablet by mouth every 6 (six) hours as needed.  60 tablet  0  . levothyroxine (SYNTHROID, LEVOTHROID) 100 MCG tablet Take 100 mcg by mouth daily.      Marland Kitchen lidocaine-prilocaine (EMLA) cream Apply topically as needed.  30 g  4  . LORazepam (ATIVAN) 0.5 MG tablet Take 1 tablet  (0.5 mg total) by mouth every 6 (six) hours as needed (Nausea or vomiting).  30 tablet  0  . meloxicam (MOBIC) 15 MG tablet Take 15 mg by mouth daily.      . mometasone (NASONEX) 50 MCG/ACT nasal spray Place 2 sprays into the nose daily.      . ondansetron (ZOFRAN) 8 MG tablet Take 1 tablet (8 mg total) by mouth 2 (two) times daily. Take two times a day starting the day after chemo for 3 days. Then take two times a day as needed for nausea or vomiting.  30 tablet  1  . potassium chloride SA (K-DUR,KLOR-CON) 20 MEQ tablet Pt is to take 40 MEQ (2 tablets) daily  60 tablet  0  . prochlorperazine (COMPAZINE) 10 MG tablet Take 1 tablet (10 mg total) by mouth every 6 (six) hours as needed (Nausea or vomiting).  30 tablet  1  . prochlorperazine (COMPAZINE) 25 MG suppository Place 1 suppository (25 mg total) rectally every 12 (twelve) hours as needed for nausea.  12 suppository  3  . ranitidine (ZANTAC) 300 MG tablet Take 300 mg by mouth at bedtime.      Marland Kitchen UNABLE TO FIND Cranial Prothesis  1 each  0   Current Facility-Administered Medications  Medication Dose Route Frequency Provider Last Rate Last Dose  . 0.9 %  sodium chloride infusion   Intravenous Once Augustin Schooling, NP      . ondansetron (ZOFRAN) IVPB 8 mg  8 mg Intravenous Once Augustin Schooling, NP        Health Maintenance:  ColonoscopyShe has never had a colonoscopy Bone DensityBone density about 10-12 years ago Last PAP smearLast Pap smear was in 2012   REVIEW OF SYSTEMS: General: fatigue (-), night sweats (-), fever (-), pain (-) Lymph: palpable nodes (-) HEENT: vision changes (-), mucositis (+), gum bleeding (-), epistaxis (-) Cardiovascular: chest pain (-), palpitations (-) Pulmonary: shortness of breath (-), dyspnea on exertion (-), cough (-), hemoptysis (-) GI:  Early satiety (-), melena (-), dysphagia (-), nausea/vomiting (+), diarrhea (-) GU: dysuria (-), hematuria (-), incontinence (-) Musculoskeletal: joint swelling (-),  joint pain (-), back pain (-) Neuro: weakness (-), numbness (-), headache (-), confusion (-) Skin: Rash (+), lesions (-), dryness (-) Psych: depression (-), suicidal/homicidal ideation (-), feeling of hopelessness (-)   PHYSICAL EXAMINATION: Blood pressure 119/61, pulse 79, temperature 98.3 F (36.8 C), temperature source Oral, resp. rate 20, height 5\' 1"  (1.549 m), weight 192 lb (87.091 kg). General: Patient is a well appearing female in no acute distress HEENT: PERRLA, sclerae anicteric no conjunctival pallor, MMM, 0.1 cm ulceration on tongue, and ulceration in right buccal mucosa. Neck: supple, no palpable adenopathy Lungs: clear to auscultation  bilaterally, no wheezes, rhonchi, or rales Cardiovascular: regular rate rhythm, S1, S2, no murmurs, rubs or gallops Abdomen: Soft, non-tender, non-distended, normoactive bowel sounds, no HSM Extremities: warm and well perfused, no clubbing, cyanosis, or edema Skin: No rashes or lesions Neuro: Non-focal ECOG PERFORMANCE STATUS: 1 - Symptomatic but completely ambulatory Breasts: unable to palpate left breast mass, no nodularity, right breast: no masses or nodularity STUDIES/RESULTS: No results found.   LABS:    Chemistry      Component Value Date/Time   NA 135* 03/30/2012 1442   K 3.8 03/30/2012 1442   CL 97* 03/30/2012 1442   CO2 28 03/30/2012 1442   BUN 14.0 03/30/2012 1442   CREATININE 1.3* 03/30/2012 1442      Component Value Date/Time   CALCIUM 9.1 03/30/2012 1442   ALKPHOS 79 03/30/2012 1442   AST 18 03/30/2012 1442   ALT 31 03/30/2012 1442   BILITOT 0.51 03/30/2012 1442      Lab Results  Component Value Date   WBC 4.6 04/04/2012   HGB 9.3* 04/04/2012   HCT 27.5* 04/04/2012   MCV 89.6 04/04/2012   PLT 87* 04/04/2012       PATHOLOGY:  ADDITIONAL INFORMATION: PROGNOSTIC INDICATORS - ACIS Results IMMUNOHISTOCHEMICAL AND MORPHOMETRIC ANALYSIS BY THE AUTOMATED CELLULAR IMAGING SYSTEM (ACIS) Estrogen Receptor  (Negative, <1%): 0%, NEGATIVE Progesterone Receptor (Negative, <1%): 0%, NEGATIVE Proliferation Marker Ki67 by M IB-1 (Low<20%): 98% All controls stained appropriately Pecola Leisure MD Pathologist, Electronic Signature ( Signed 02/15/2012) CHROMOGENIC IN-SITU HYBRIDIZATION Interpretation: HER2/NEU BY CISH - SHOWS AMPLIFICATION BY CISH ANALYSIS. THE RATIO OF HER2: CEP 17 SIGNALS WAS 2.55. 40 TUMOR CELLS WERE SCORED. OF NOTE, THE TUMOR APPEARS TO SHOW HETEROGENEITY IN REGARDS TO HER2 EXPRESSION. Reference range: Ratio: HER2:CEP17 < 1.8 gene amplification not observed Ratio: HER2:CEP 17 1.8-2.2 - equivocal result Ratio: HER2:CEP17 > 2.2 - gene amplification observed Pecola Leisure MD Pathologist, Electronic Signature ( Signed 02/15/2012) 1 of ADDITIONAL INFORMATION: PROGNOSTIC INDICATORS - ACIS Results IMMUNOHISTOCHEMICAL AND MORPHOMETRIC ANALYSIS BY THE AUTOMATED CELLULAR IMAGING SYSTEM (ACIS) Estrogen Receptor (Negative, <1%): 0%, NEGATIVE Progesterone Receptor (Negative, <1%): 0%, NEGATIVE COMMENT: The negative hormone receptor study(ies) in this case have an internal positive control. All controls stained appropriately Abigail Miyamoto MD Pathologist, Electronic Signature ( Signed 02/22/2012)  ASSESSMENT #1 Stage 1 HER-2 positive left breast cancer ER negative PR negative.  Patient is being considered for neoadjuvant chemotherapy consisting of Taxotere carboplatinum and herceptin as she does want breast conservation.  She understands the risks and benefits of this approach and the side effects of her chemotherapy and Herceptin.  #2 She understands she will receive neulasta the day after chemotherapy to keep her white count up.    #3 She does have thrombocytopenia and we will monitor this  #4 Mucositis  #5 Rash  #6 Nausea and dehydration  PLAN:  #1 Ms. Goris will proceed with Herceptin today, her labs are stable.    #2 she will IV fluids today along with Zofran for her  nausea and dehydration.  #3 She will take benadryl as needed for her rash, as well as hydrocortisone cream.  Should it not improve, we will send her to dermatology.    #4 She will continue her mouth rinses for her mucositis after meals, as well as avoiding any mouth rinses with alcohol, or spicy/acidic foods.   #5 We will see her back next week for chemotherapy.    All questions were answered. The patient knows to call the clinic with any  problems, questions or concerns. We can certainly see the patient much sooner if necessary.  I spent 25 minutes counseling the patient face to face. The total time spent in the appointment was 30 minutes.  Cherie Ouch Lyn Hollingshead, NP Medical Oncology [facility] Phone: [phone]     04/04/2012, 10:01 AM

## 2012-04-05 ENCOUNTER — Other Ambulatory Visit: Payer: Self-pay | Admitting: Certified Registered Nurse Anesthetist

## 2012-04-05 ENCOUNTER — Ambulatory Visit: Payer: BC Managed Care – PPO

## 2012-04-06 ENCOUNTER — Telehealth: Payer: Self-pay | Admitting: Medical Oncology

## 2012-04-06 NOTE — Telephone Encounter (Signed)
Tell her that we reviewed her labs that resulted after her appointment, and she should take 20 meq a day as prescribed.

## 2012-04-06 NOTE — Telephone Encounter (Signed)
Error

## 2012-04-06 NOTE — Telephone Encounter (Signed)
Called Patient, spoke with husband, per husband "she is taking a nap" and reiterated, Per NP, that patient is to take 20 meq a day as prescribed. Patient's spouse verbalized understanding and stated the he would make sure she does. All questions answered. Expressed to husband to call our office should he or patient have any questions.

## 2012-04-06 NOTE — Telephone Encounter (Signed)
Called patient back to confirm that she received message left on her VM 04/04/12 to take potassium chloride 1 times a day, patient states that she receive message did but has been continuing to take two (2) tablets a day "like I was told to before." Patients potassium 04/04/12 @ 3.1. Will review with MD patient's response.

## 2012-04-10 ENCOUNTER — Other Ambulatory Visit: Payer: BC Managed Care – PPO | Admitting: Lab

## 2012-04-11 ENCOUNTER — Other Ambulatory Visit (HOSPITAL_BASED_OUTPATIENT_CLINIC_OR_DEPARTMENT_OTHER): Payer: BC Managed Care – PPO | Admitting: Lab

## 2012-04-11 ENCOUNTER — Ambulatory Visit (HOSPITAL_BASED_OUTPATIENT_CLINIC_OR_DEPARTMENT_OTHER): Payer: BC Managed Care – PPO | Admitting: Adult Health

## 2012-04-11 ENCOUNTER — Telehealth: Payer: Self-pay | Admitting: Oncology

## 2012-04-11 ENCOUNTER — Telehealth: Payer: Self-pay | Admitting: *Deleted

## 2012-04-11 ENCOUNTER — Encounter: Payer: Self-pay | Admitting: Adult Health

## 2012-04-11 ENCOUNTER — Ambulatory Visit (HOSPITAL_BASED_OUTPATIENT_CLINIC_OR_DEPARTMENT_OTHER): Payer: BC Managed Care – PPO

## 2012-04-11 VITALS — BP 132/68 | HR 92 | Temp 97.9°F | Resp 20 | Ht 61.0 in | Wt 195.5 lb

## 2012-04-11 DIAGNOSIS — Z5112 Encounter for antineoplastic immunotherapy: Secondary | ICD-10-CM

## 2012-04-11 DIAGNOSIS — Z5111 Encounter for antineoplastic chemotherapy: Secondary | ICD-10-CM

## 2012-04-11 DIAGNOSIS — R21 Rash and other nonspecific skin eruption: Secondary | ICD-10-CM

## 2012-04-11 DIAGNOSIS — C50419 Malignant neoplasm of upper-outer quadrant of unspecified female breast: Secondary | ICD-10-CM

## 2012-04-11 DIAGNOSIS — Z8639 Personal history of other endocrine, nutritional and metabolic disease: Secondary | ICD-10-CM

## 2012-04-11 DIAGNOSIS — D696 Thrombocytopenia, unspecified: Secondary | ICD-10-CM

## 2012-04-11 LAB — CBC WITH DIFFERENTIAL/PLATELET
Basophils Absolute: 0 10*3/uL (ref 0.0–0.1)
EOS%: 0.1 % (ref 0.0–7.0)
Eosinophils Absolute: 0 10*3/uL (ref 0.0–0.5)
HCT: 27.5 % — ABNORMAL LOW (ref 34.8–46.6)
HGB: 9 g/dL — ABNORMAL LOW (ref 11.6–15.9)
MCH: 30.7 pg (ref 25.1–34.0)
MCV: 93.9 fL (ref 79.5–101.0)
NEUT%: 80.2 % — ABNORMAL HIGH (ref 38.4–76.8)
lymph#: 1.7 10*3/uL (ref 0.9–3.3)

## 2012-04-11 LAB — COMPREHENSIVE METABOLIC PANEL (CC13)
Albumin: 3.4 g/dL — ABNORMAL LOW (ref 3.5–5.0)
Alkaline Phosphatase: 104 U/L (ref 40–150)
BUN: 11 mg/dL (ref 7.0–26.0)
CO2: 28 mEq/L (ref 22–29)
Calcium: 8.4 mg/dL (ref 8.4–10.4)
Glucose: 168 mg/dl — ABNORMAL HIGH (ref 70–99)
Potassium: 4.1 mEq/L (ref 3.5–5.1)

## 2012-04-11 MED ORDER — DOCETAXEL CHEMO INJECTION 160 MG/16ML
75.0000 mg/m2 | Freq: Once | INTRAVENOUS | Status: AC
  Administered 2012-04-11: 150 mg via INTRAVENOUS
  Filled 2012-04-11: qty 15

## 2012-04-11 MED ORDER — DEXAMETHASONE SODIUM PHOSPHATE 4 MG/ML IJ SOLN
20.0000 mg | Freq: Once | INTRAMUSCULAR | Status: AC
  Administered 2012-04-11: 20 mg via INTRAVENOUS

## 2012-04-11 MED ORDER — SODIUM CHLORIDE 0.9 % IV SOLN: 616.2000 mg | Freq: Once | INTRAVENOUS | Status: DC

## 2012-04-11 MED ORDER — SODIUM CHLORIDE 0.9 % IV SOLN
577.2000 mg | Freq: Once | INTRAVENOUS | Status: AC
  Administered 2012-04-11: 580 mg via INTRAVENOUS
  Filled 2012-04-11: qty 58

## 2012-04-11 MED ORDER — ONDANSETRON 16 MG/50ML IVPB (CHCC)
16.0000 mg | Freq: Once | INTRAVENOUS | Status: AC
  Administered 2012-04-11: 16 mg via INTRAVENOUS

## 2012-04-11 MED ORDER — SODIUM CHLORIDE 0.9 % IV SOLN
Freq: Once | INTRAVENOUS | Status: AC
  Administered 2012-04-11: 11:00:00 via INTRAVENOUS

## 2012-04-11 MED ORDER — DIPHENHYDRAMINE HCL 25 MG PO CAPS
50.0000 mg | ORAL_CAPSULE | Freq: Once | ORAL | Status: AC
  Administered 2012-04-11: 50 mg via ORAL

## 2012-04-11 MED ORDER — TRASTUZUMAB CHEMO INJECTION 440 MG
2.0000 mg/kg | Freq: Once | INTRAVENOUS | Status: AC
  Administered 2012-04-11: 189 mg via INTRAVENOUS
  Filled 2012-04-11: qty 9

## 2012-04-11 MED ORDER — ACETAMINOPHEN 325 MG PO TABS
650.0000 mg | ORAL_TABLET | Freq: Once | ORAL | Status: AC
  Administered 2012-04-11: 650 mg via ORAL

## 2012-04-11 MED ORDER — HEPARIN SOD (PORK) LOCK FLUSH 100 UNIT/ML IV SOLN
500.0000 [IU] | Freq: Once | INTRAVENOUS | Status: AC | PRN
  Administered 2012-04-11: 500 [IU]
  Filled 2012-04-11: qty 5

## 2012-04-11 MED ORDER — SODIUM CHLORIDE 0.9 % IJ SOLN
10.0000 mL | INTRAMUSCULAR | Status: DC | PRN
  Administered 2012-04-11: 10 mL
  Filled 2012-04-11: qty 10

## 2012-04-11 NOTE — Telephone Encounter (Signed)
gve the pt her revised dec 2013 appt calendar

## 2012-04-11 NOTE — Telephone Encounter (Signed)
Per staff phone call and POF I have scheduled appts. JMW  

## 2012-04-11 NOTE — Progress Notes (Signed)
Maria Mckinney [id number] 1950-12-07 61 y.o. 04/11/2012 10:48 AM  CC  [name], MD Dr. [name] [address] [place] [address] Dr. [name] Dr. [name] Dr. 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  PRIOR THERAPY:  #1Patient was originally seen in the multidisciplinary breast clinic for new diagnosis of left breast cancer that was 8mm in the upper outer quadrant of the left breast.  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  There was an additional 4 cm area of normal signal.  Patient did not get an MRI secondary to cochlear implant.  She desires breast conservation.    #2 Patient is s/p port-a-cath placement for neoadjuvant chemotherapy consisting of Taxotere/carboplatinum/Herceptin.  She will begin cycle #1 on 02/29/2012.  She will receive Taxotere and carboplatinum every 21 days with Herceptin giving weekly.  She will receive a total of 4 cycles of Taxotere and Carboplatinum.    CURRENT THERAPY: C3 day 1 TCH with Neulasta support, (weekly herceptin)  INTERVAL HISTORY: Maria Mckinney is doing well today.  She is here for her third cycle of TCH.  Her rash is resolving, and has dried up.  Her mucositis is improved.  She's looking forward to getting closer to the completion of her chemotherapy and surgery.  She has no complaints today.  She does have questions about her HER2 positivity.  Otherwise, her 10 point ROS is negative.    Past Medical History: Past Medical History  Diagnosis Date  . HTN (hypertension)   . Fibromyalgia   . GERD (gastroesophageal reflux disease)   . Arthritis   . Anxiety   . Dysrhythmia     irreg hr  . Wears hearing aid     left ear  . 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 PONV (postoperative nausea and vomiting)     Past Surgical  History: Past Surgical History  Procedure Date  . Appendectomy   . Combined hysteroscopy diagnostic / d&c   . Cochlear implant   . Portacath placement 02/22/2012    Procedure: INSERTION PORT-A-CATH;  Surgeon: [name], MD;  Location: [facility];  Service: General;  Laterality: Right;    Family History: Family History  Problem Relation Age of Onset  . Cancer Mother     "tumor of face removed"  . Cancer Father     kidney removed  . Cancer Maternal Aunt     stomach & Pancreatic  . Cancer Paternal Uncle     pancreatic    Social History History  Substance Use Topics  . Smoking status: Never Smoker   . Smokeless tobacco: Not on file  . Alcohol Use: 0.0 oz/week    0-1 Glasses of wine per week    Allergies: Allergies  Allergen Reactions  . Acyclovir And Related   . Ciprofloxacin Hcl Rash    Current Medications: Current Outpatient Prescriptions  Medication Sig Dispense Refill  . Alum & Mag Hydroxide-Simeth (MAGIC MOUTHWASH W/LIDOCAINE) SOLN Take 5 mLs by mouth 4 (four) times daily.  200 mL  6  . bisoprolol-hydrochlorothiazide (ZIAC) 5-6.25 MG per tablet Take 1 tablet by mouth daily.      . cyclobenzaprine (FLEXERIL) 10 MG tablet Take 10 mg by mouth 3 (three) times daily as needed.      Marland Kitchen dexamethasone (DECADRON) 4 MG tablet Take  2 tablets (8 mg total) by mouth 2 (two) times daily with a meal. Take two times a day the day before Taxotere. Then take two times a day starting the day after chemo for 3 days.  30 tablet  1  . DULoxetine (CYMBALTA) 60 MG capsule Take 60 mg by mouth daily.      Marland Kitchen HYDROcodone-acetaminophen (VICODIN) 5-500 MG per tablet Take 1 tablet by mouth every 6 (six) hours as needed.  60 tablet  0  . levothyroxine (SYNTHROID, LEVOTHROID) 100 MCG tablet Take 100 mcg by mouth daily.      Marland Kitchen lidocaine-prilocaine (EMLA) cream Apply topically as needed.  30 g  4  . LORazepam (ATIVAN) 0.5 MG tablet Take 1 tablet (0.5 mg total) by mouth every 6  (six) hours as needed (Nausea or vomiting).  30 tablet  0  . meloxicam (MOBIC) 15 MG tablet Take 15 mg by mouth daily.      . mometasone (NASONEX) 50 MCG/ACT nasal spray Place 2 sprays into the nose daily.      . ondansetron (ZOFRAN) 8 MG tablet Take 1 tablet (8 mg total) by mouth 2 (two) times daily. Take two times a day starting the day after chemo for 3 days. Then take two times a day as needed for nausea or vomiting.  30 tablet  1  . potassium chloride SA (K-DUR,KLOR-CON) 20 MEQ tablet 1 po daily x 7 days  7 tablet  0  . prochlorperazine (COMPAZINE) 10 MG tablet Take 1 tablet (10 mg total) by mouth every 6 (six) hours as needed (Nausea or vomiting).  30 tablet  1  . prochlorperazine (COMPAZINE) 25 MG suppository Place 1 suppository (25 mg total) rectally every 12 (twelve) hours as needed for nausea.  12 suppository  3  . ranitidine (ZANTAC) 300 MG tablet Take 300 mg by mouth at bedtime.      Marland Kitchen UNABLE TO FIND Cranial Prothesis  1 each  0    Health Maintenance:  ColonoscopyShe has never had a colonoscopy Bone DensityBone density about 10-12 years ago Last PAP smearLast Pap smear was in 2012   REVIEW OF SYSTEMS: General: fatigue (-), night sweats (-), fever (-), pain (-) Lymph: palpable nodes (-) HEENT: vision changes (-), mucositis (-), gum bleeding (-), epistaxis (-) Cardiovascular: chest pain (-), palpitations (-) Pulmonary: shortness of breath (-), dyspnea on exertion (-), cough (-), hemoptysis (-) GI:  Early satiety (-), melena (-), dysphagia (-), nausea/vomiting (-), diarrhea (-) GU: dysuria (-), hematuria (-), incontinence (-) Musculoskeletal: joint swelling (-), joint pain (-), back pain (-) Neuro: weakness (-), numbness (-), headache (-), confusion (-) Skin: Rash (-), lesions (-), dryness (-) Psych: depression (-), suicidal/homicidal ideation (-), feeling of hopelessness (-)   PHYSICAL EXAMINATION: Blood pressure 132/68, pulse 92, temperature 97.9 F (36.6 C), resp. rate  20, height 5\' 1"  (1.549 m), weight 195 lb 8 oz (88.678 kg). General: Patient is a well appearing female in no acute distress HEENT: PERRLA, sclerae anicteric no conjunctival pallor, MMM, Neck: supple, no palpable adenopathy Lungs: clear to auscultation bilaterally, no wheezes, rhonchi, or rales Cardiovascular: regular rate rhythm, S1, S2, no murmurs, rubs or gallops Abdomen: Soft, non-tender, non-distended, normoactive bowel sounds, no HSM Extremities: warm and well perfused, no clubbing, cyanosis, or edema Skin: No rashes or lesions Neuro: Non-focal ECOG PERFORMANCE STATUS: 1 - Symptomatic but completely ambulatory Breasts: unable to palpate left breast mass, no nodularity, right breast: no masses or nodularity STUDIES/RESULTS: No results found.   LABS:  Chemistry      Component Value Date/Time   NA 136 04/04/2012 0859   K 3.1* 04/04/2012 0859   CL 94* 04/04/2012 0859   CO2 30* 04/04/2012 0859   BUN 8.0 04/04/2012 0859   CREATININE 1.2* 04/04/2012 0859      Component Value Date/Time   CALCIUM 8.1* 04/04/2012 0859   ALKPHOS 89 04/04/2012 0859   AST 28 04/04/2012 0859   ALT 36 04/04/2012 0859   BILITOT 0.63 04/04/2012 0859      Lab Results  Component Value Date   WBC 10.7* 04/11/2012   HGB 9.0* 04/11/2012   HCT 27.5* 04/11/2012   MCV 93.9 04/11/2012   PLT 212 04/11/2012       PATHOLOGY:  ADDITIONAL INFORMATION: PROGNOSTIC INDICATORS - ACIS Results IMMUNOHISTOCHEMICAL AND MORPHOMETRIC ANALYSIS BY THE AUTOMATED CELLULAR IMAGING SYSTEM (ACIS) Estrogen Receptor (Negative, <1%): 0%, NEGATIVE Progesterone Receptor (Negative, <1%): 0%, NEGATIVE Proliferation Marker Ki67 by M IB-1 (Low<20%): 98% All controls stained appropriately Pecola Leisure MD Pathologist, Electronic Signature ( Signed 02/15/2012) CHROMOGENIC IN-SITU HYBRIDIZATION Interpretation: HER2/NEU BY CISH - SHOWS AMPLIFICATION BY CISH ANALYSIS. THE RATIO OF HER2: CEP 17 SIGNALS WAS 2.55. 40 TUMOR CELLS WERE  SCORED. OF NOTE, THE TUMOR APPEARS TO SHOW HETEROGENEITY IN REGARDS TO HER2 EXPRESSION. Reference range: Ratio: HER2:CEP17 < 1.8 gene amplification not observed Ratio: HER2:CEP 17 1.8-2.2 - equivocal result Ratio: HER2:CEP17 > 2.2 - gene amplification observed Pecola Leisure MD Pathologist, Electronic Signature ( Signed 02/15/2012) 1 of ADDITIONAL INFORMATION: PROGNOSTIC INDICATORS - ACIS Results IMMUNOHISTOCHEMICAL AND MORPHOMETRIC ANALYSIS BY THE AUTOMATED CELLULAR IMAGING SYSTEM (ACIS) Estrogen Receptor (Negative, <1%): 0%, NEGATIVE Progesterone Receptor (Negative, <1%): 0%, NEGATIVE COMMENT: The negative hormone receptor study(ies) in this case have an internal positive control. All controls stained appropriately Abigail Miyamoto MD Pathologist, Electronic Signature ( Signed 02/22/2012)  ASSESSMENT #1 Stage 1 HER-2 positive left breast cancer ER negative PR negative.  Patient is being considered for neoadjuvant chemotherapy consisting of Taxotere carboplatinum and herceptin as she does want breast conservation.  She understands the risks and benefits of this approach and the side effects of her chemotherapy and Herceptin.  #2 She understands she will receive neulasta the day after chemotherapy to keep her white count up.    #3 She does have thrombocytopenia and we will monitor this  #4 Rash  #5 Nausea and dehydration  PLAN:  #1 Maria Mckinney will proceed with her [facility] chemo today.  Her labs continue to improve.     #2 she will receive IV fluids today, tomorrow, next Tuesday and Wednesday, since she does get very dehydrated with her treatment.    #3 Her rash has improved.  From now on we will not put her on antibiotics for her neutropenia and only monitor.    #4 We will see her back next week on 12/17 for labs, appt, and IV fluids, 12/18 for herceptin and IV fluids.    All questions were answered. The patient knows to call the clinic with any problems, questions or concerns. We  can certainly see the patient much sooner if necessary.  I spent 25 minutes counseling the patient face to face. The total time spent in the appointment was 30 minutes.  Cherie Ouch Lyn Hollingshead, NP Medical Oncology [facility] Phone: [phone]     04/11/2012, 10:48 AM

## 2012-04-11 NOTE — Patient Instructions (Addendum)
Doing well.  Proceed with chemotherapy.  We will see you back on 12/17 and add on IV fluids tomorrow, 12/17, and 12/18.  Please call us if you have any questions or concerns.

## 2012-04-11 NOTE — Patient Instructions (Addendum)
[facility] Discharge Instructions for Patients Receiving Chemotherapy  Today you received the following chemotherapy agents Taxotere, Carboplatin and Herceptin.  To help prevent nausea and vomiting after your treatment, we encourage you to take your nausea medication as prescribed.   If you develop nausea and vomiting that is not controlled by your nausea medication, call the clinic. If it is after clinic hours your family physician or the after hours number for the clinic or go to the Emergency Department.   BELOW ARE SYMPTOMS THAT SHOULD BE REPORTED IMMEDIATELY:  *FEVER GREATER THAN 100.5 F  *CHILLS WITH OR WITHOUT FEVER  NAUSEA AND VOMITING THAT IS NOT CONTROLLED WITH YOUR NAUSEA MEDICATION  *UNUSUAL SHORTNESS OF BREATH  *UNUSUAL BRUISING OR BLEEDING  TENDERNESS IN MOUTH AND THROAT WITH OR WITHOUT PRESENCE OF ULCERS  *URINARY PROBLEMS  *BOWEL PROBLEMS  UNUSUAL RASH Items with * indicate a potential emergency and should be followed up as soon as possible.  Feel free to call the clinic you have any questions or concerns. The clinic phone number is [phone].   I have been informed and understand all the instructions given to me. I know to contact the clinic, my physician, or go to the Emergency Department if any problems should occur. I do not have any questions at this time, but understand that I may call the clinic during office hours   should I have any questions or need assistance in obtaining follow up care.    __________________________________________  _____________  __________ Signature of Patient or Authorized Representative            Date                   Time    __________________________________________ Nurse's Signature

## 2012-04-12 ENCOUNTER — Ambulatory Visit: Payer: BC Managed Care – PPO

## 2012-04-12 ENCOUNTER — Ambulatory Visit (HOSPITAL_BASED_OUTPATIENT_CLINIC_OR_DEPARTMENT_OTHER): Payer: BC Managed Care – PPO

## 2012-04-12 VITALS — BP 136/67 | HR 92 | Temp 97.3°F | Resp 20

## 2012-04-12 DIAGNOSIS — C50419 Malignant neoplasm of upper-outer quadrant of unspecified female breast: Secondary | ICD-10-CM

## 2012-04-12 DIAGNOSIS — Z5189 Encounter for other specified aftercare: Secondary | ICD-10-CM

## 2012-04-12 DIAGNOSIS — Z8639 Personal history of other endocrine, nutritional and metabolic disease: Secondary | ICD-10-CM

## 2012-04-12 MED ORDER — SODIUM CHLORIDE 0.9 % IV SOLN
Freq: Once | INTRAVENOUS | Status: AC
  Administered 2012-04-12: 15:00:00 via INTRAVENOUS

## 2012-04-12 MED ORDER — PEGFILGRASTIM INJECTION 6 MG/0.6ML
6.0000 mg | Freq: Once | SUBCUTANEOUS | Status: AC
  Administered 2012-04-12: 6 mg via SUBCUTANEOUS
  Filled 2012-04-12: qty 0.6

## 2012-04-12 NOTE — Patient Instructions (Signed)
[facility] Discharge Instructions for Patients Receiving Chemotherapy  Today you received IV Fluids and Neulasta  To help prevent nausea and vomiting after your treatment, we encourage you to take your nausea medication as prescribed.   If you develop nausea and vomiting that is not controlled by your nausea medication, call the clinic. If it is after clinic hours your family physician or the after hours number for the clinic or go to the Emergency Department.   BELOW ARE SYMPTOMS THAT SHOULD BE REPORTED IMMEDIATELY:  *FEVER GREATER THAN 100.5 F  *CHILLS WITH OR WITHOUT FEVER  NAUSEA AND VOMITING THAT IS NOT CONTROLLED WITH YOUR NAUSEA MEDICATION  *UNUSUAL SHORTNESS OF BREATH  *UNUSUAL BRUISING OR BLEEDING  TENDERNESS IN MOUTH AND THROAT WITH OR WITHOUT PRESENCE OF ULCERS  *URINARY PROBLEMS  *BOWEL PROBLEMS  UNUSUAL RASH Items with * indicate a potential emergency and should be followed up as soon as possible.  Feel free to call the clinic you have any questions or concerns. The clinic phone number is [phone].   I have been informed and understand all the instructions given to me. I know to contact the clinic, my physician, or go to the Emergency Department if any problems should occur. I do not have any questions at this time, but understand that I may call the clinic during office hours   should I have any questions or need assistance in obtaining follow up care.

## 2012-04-12 NOTE — Progress Notes (Unsigned)
Pt reports her mother is being admitted to the hospital today.  She questions if it would be safe to visit her mother today/over the weekend.  Per Augustin Schooling, NP, okay for pt to visit mother.  If over the weekend, pt should wear a mask.  Instructed pt of Lindsey's recommendations, gave her surgical masks to wear.  Also advised her to be vigilant on handwashing.  Pt verbalized understanding-dhp, rn

## 2012-04-16 ENCOUNTER — Other Ambulatory Visit: Payer: Self-pay | Admitting: Certified Registered Nurse Anesthetist

## 2012-04-17 ENCOUNTER — Encounter: Payer: Self-pay | Admitting: Adult Health

## 2012-04-17 ENCOUNTER — Other Ambulatory Visit: Payer: BC Managed Care – PPO | Admitting: Lab

## 2012-04-17 ENCOUNTER — Other Ambulatory Visit (HOSPITAL_BASED_OUTPATIENT_CLINIC_OR_DEPARTMENT_OTHER): Payer: BC Managed Care – PPO | Admitting: Lab

## 2012-04-17 ENCOUNTER — Ambulatory Visit (HOSPITAL_BASED_OUTPATIENT_CLINIC_OR_DEPARTMENT_OTHER): Payer: BC Managed Care – PPO | Admitting: Adult Health

## 2012-04-17 ENCOUNTER — Ambulatory Visit (HOSPITAL_BASED_OUTPATIENT_CLINIC_OR_DEPARTMENT_OTHER): Payer: BC Managed Care – PPO

## 2012-04-17 VITALS — BP 134/72 | HR 99 | Temp 99.1°F | Resp 20 | Ht 61.0 in | Wt 186.8 lb

## 2012-04-17 VITALS — BP 128/65 | HR 87 | Temp 98.2°F | Resp 17

## 2012-04-17 DIAGNOSIS — E876 Hypokalemia: Secondary | ICD-10-CM

## 2012-04-17 DIAGNOSIS — E860 Dehydration: Secondary | ICD-10-CM

## 2012-04-17 DIAGNOSIS — C50419 Malignant neoplasm of upper-outer quadrant of unspecified female breast: Secondary | ICD-10-CM

## 2012-04-17 DIAGNOSIS — D696 Thrombocytopenia, unspecified: Secondary | ICD-10-CM

## 2012-04-17 DIAGNOSIS — Z8639 Personal history of other endocrine, nutritional and metabolic disease: Secondary | ICD-10-CM

## 2012-04-17 DIAGNOSIS — D709 Neutropenia, unspecified: Secondary | ICD-10-CM

## 2012-04-17 DIAGNOSIS — R110 Nausea: Secondary | ICD-10-CM

## 2012-04-17 LAB — COMPREHENSIVE METABOLIC PANEL (CC13)
ALT: 78 U/L — ABNORMAL HIGH (ref 0–55)
AST: 40 U/L — ABNORMAL HIGH (ref 5–34)
Alkaline Phosphatase: 83 U/L (ref 40–150)
Calcium: 8.5 mg/dL (ref 8.4–10.4)
Chloride: 89 mEq/L — ABNORMAL LOW (ref 98–107)
Creatinine: 0.9 mg/dL (ref 0.6–1.1)
Potassium: 2.9 mEq/L — ABNORMAL LOW (ref 3.5–5.1)

## 2012-04-17 LAB — CBC WITH DIFFERENTIAL/PLATELET
BASO%: 0 % (ref 0.0–2.0)
EOS%: 1.2 % (ref 0.0–7.0)
MCH: 31.9 pg (ref 25.1–34.0)
MCHC: 28.8 g/dL — ABNORMAL LOW (ref 31.5–36.0)
RDW: 17.9 % — ABNORMAL HIGH (ref 11.2–14.5)
lymph#: 0.4 10*3/uL — ABNORMAL LOW (ref 0.9–3.3)

## 2012-04-17 MED ORDER — PROMETHAZINE HCL 25 MG/ML IJ SOLN
12.5000 mg | Freq: Once | INTRAMUSCULAR | Status: AC
  Administered 2012-04-17: 12.5 mg via INTRAVENOUS
  Filled 2012-04-17: qty 1

## 2012-04-17 MED ORDER — SODIUM CHLORIDE 0.9 % IV SOLN
1000.0000 mL | Freq: Once | INTRAVENOUS | Status: AC
  Administered 2012-04-17: 1000 mL via INTRAVENOUS

## 2012-04-17 NOTE — Progress Notes (Signed)
1655 VSS. Patient with no complaints of dizziness or nausea. Discharge with spouse and ambulating.

## 2012-04-17 NOTE — Patient Instructions (Addendum)
You will receive IV fluids and Phenergan today in the treatment room.  You will receive IV fluids and Herceptin tomorrow.  Start taking the Compazine one tablet in the morning and one in the evening.  You can also use the Ativan one tablet every 6 hours as needed.  Do not take the Ativan if you feel too drowsy.  Please call us if you have any questions or concerns.      Patient Neutropenia Instruction Sheet  Diagnosis: Breast Cancer      Treating Physician: Drue Second, MD  Treatment: 1. Type of chemotherapy: TCH 2. Date of last treatment: 04/11/12  Last Blood Counts: Lab Results  Component Value Date   WBC 0.8* 04/17/2012   HGB 9.0* 04/17/2012   HCT 31.3* 04/17/2012   MCV 111.0* 04/17/2012   PLT 133* 04/17/2012  ANC 200       Prophylactic Antibiotics: NONE Instructions: 1. Monitor temperature and call if fever  greater than 100.5, chills, shaking chills (rigors) 2. Call Physician on-call at [phone] 3. Give him/her symptoms and list of medications that you are taking and your last blood count.

## 2012-04-17 NOTE — Progress Notes (Signed)
Maria Mckinney [id number] 1950/07/01 61 y.o. 04/18/2012 11:57 AM  CC  [name], MD Dr. [name] [address] [place] [address] Dr. [name] Dr. [name] Dr. 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  PRIOR THERAPY:  #1Patient was originally seen in the multidisciplinary breast clinic for new diagnosis of left breast cancer that was 8mm in the upper outer quadrant of the left breast.  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  There was an additional 4 cm area of normal signal.  Patient did not get an MRI secondary to cochlear implant.  She desires breast conservation.    #2 Patient is s/p port-a-cath placement for neoadjuvant chemotherapy consisting of Taxotere/carboplatinum/Herceptin.  She will begin cycle #1 on 02/29/2012.  She will receive Taxotere and carboplatinum every 21 days with Herceptin giving weekly.  She will receive a total of 4 cycles of Taxotere and Carboplatinum.    CURRENT THERAPY: C3 day 8 TCH with Neulasta support, (weekly herceptin)  INTERVAL HISTORY: Maria Mckinney is doing well today.  She is here a week after her [facility].  She has had quite a bit of nausea not relieved with Zofran.  She developed diarrhea, and then constipation, that she did take a laxative for and is now resolved.  She hasn't tried any of her other anti-emetics.  She is neutropenic and hasn't had any fevers, chills, numbness, or tingling.    Past Medical History: Past Medical History  Diagnosis Date  . HTN (hypertension)   . Fibromyalgia   . GERD (gastroesophageal reflux disease)   . Arthritis   . Anxiety   . Dysrhythmia     irreg hr  . Wears hearing aid     left ear  . 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 PONV (postoperative nausea and vomiting)     Past Surgical  History: Past Surgical History  Procedure Date  . Appendectomy   . Combined hysteroscopy diagnostic / d&c   . Cochlear implant   . Portacath placement 02/22/2012    Procedure: INSERTION PORT-A-CATH;  Surgeon: [name], MD;  Location: [facility];  Service: General;  Laterality: Right;    Family History: Family History  Problem Relation Age of Onset  . Cancer Mother     "tumor of face removed"  . Cancer Father     kidney removed  . Cancer Maternal Aunt     stomach & Pancreatic  . Cancer Paternal Uncle     pancreatic    Social History History  Substance Use Topics  . Smoking status: Never Smoker   . Smokeless tobacco: Not on file  . Alcohol Use: 0.0 oz/week    0-1 Glasses of wine per week    Allergies: Allergies  Allergen Reactions  . Acyclovir And Related   . Ciprofloxacin Hcl Rash    Current Medications: Current Outpatient Prescriptions  Medication Sig Dispense Refill  . bisoprolol-hydrochlorothiazide (ZIAC) 5-6.25 MG per tablet Take 1 tablet by mouth daily.      . cyclobenzaprine (FLEXERIL) 10 MG tablet Take 10 mg by mouth 3 (three) times daily as needed.      Marland Kitchen dexamethasone (DECADRON) 4 MG tablet Take 2 tablets (8 mg total) by mouth 2 (two) times daily with a meal. Take two times a day the day before Taxotere. Then  take two times a day starting the day after chemo for 3 days.  30 tablet  1  . DULoxetine (CYMBALTA) 60 MG capsule Take 60 mg by mouth daily.      Marland Kitchen HYDROcodone-acetaminophen (VICODIN) 5-500 MG per tablet Take 1 tablet by mouth every 6 (six) hours as needed.  60 tablet  0  . levothyroxine (SYNTHROID, LEVOTHROID) 100 MCG tablet Take 100 mcg by mouth daily.      Marland Kitchen lidocaine-prilocaine (EMLA) cream Apply topically as needed.  30 g  4  . LORazepam (ATIVAN) 0.5 MG tablet Take 1 tablet (0.5 mg total) by mouth every 6 (six) hours as needed (Nausea or vomiting).  30 tablet  0  . meloxicam (MOBIC) 15 MG tablet Take 15 mg by mouth daily.       . mometasone (NASONEX) 50 MCG/ACT nasal spray Place 2 sprays into the nose daily.      . ondansetron (ZOFRAN) 8 MG tablet Take 1 tablet (8 mg total) by mouth 2 (two) times daily. Take two times a day starting the day after chemo for 3 days. Then take two times a day as needed for nausea or vomiting.  30 tablet  1  . potassium chloride SA (K-DUR,KLOR-CON) 20 MEQ tablet 1 po daily x 7 days  7 tablet  0  . prochlorperazine (COMPAZINE) 10 MG tablet Take 1 tablet (10 mg total) by mouth every 6 (six) hours as needed (Nausea or vomiting).  30 tablet  1  . prochlorperazine (COMPAZINE) 25 MG suppository Place 1 suppository (25 mg total) rectally every 12 (twelve) hours as needed for nausea.  12 suppository  3  . ranitidine (ZANTAC) 300 MG tablet Take 300 mg by mouth at bedtime.      Marland Kitchen UNABLE TO FIND Cranial Prothesis  1 each  0  . Alum & Mag Hydroxide-Simeth (MAGIC MOUTHWASH W/LIDOCAINE) SOLN Take 5 mLs by mouth 4 (four) times daily.  200 mL  6   No current facility-administered medications for this visit.   Facility-Administered Medications Ordered in Other Visits  Medication Dose Route Frequency Provider Last Rate Last Dose  . heparin lock flush 100 unit/mL  500 Units Intracatheter Once PRN Victorino December, MD      . sodium chloride 0.9 % injection 10 mL  10 mL Intracatheter PRN Victorino December, MD      . trastuzumab (HERCEPTIN) 189 mg in sodium chloride 0.9 % 250 mL chemo infusion  2 mg/kg (Treatment Plan Actual) Intravenous Once Victorino December, MD 518 mL/hr at 04/18/12 1147 189 mg at 04/18/12 1147    Health Maintenance:  ColonoscopyShe has never had a colonoscopy Bone DensityBone density about 10-12 years ago Last PAP smearLast Pap smear was in 2012   REVIEW OF SYSTEMS: General: fatigue (-), night sweats (-), fever (-), pain (-) Lymph: palpable nodes (-) HEENT: vision changes (-), mucositis (-), gum bleeding (-), epistaxis (-) Cardiovascular: chest pain (-), palpitations (-) Pulmonary:  shortness of breath (-), dyspnea on exertion (-), cough (-), hemoptysis (-) GI:  Early satiety (-), melena (-), dysphagia (-), nausea/vomiting (+), diarrhea (+) GU: dysuria (-), hematuria (-), incontinence (-) Musculoskeletal: joint swelling (-), joint pain (-), back pain (-) Neuro: weakness (-), numbness (-), headache (-), confusion (-) Skin: Rash (-), lesions (-), dryness (-) Psych: depression (-), suicidal/homicidal ideation (-), feeling of hopelessness (-)   PHYSICAL EXAMINATION: Blood pressure 134/72, pulse 99, temperature 99.1 F (37.3 C), temperature source Oral, resp. rate 20, height 5\' 1"  (1.549  m), weight 186 lb 12.8 oz (84.732 kg). General: Patient is a well appearing female in no acute distress HEENT: PERRLA, sclerae anicteric no conjunctival pallor, MMM, Neck: supple, no palpable adenopathy Lungs: clear to auscultation bilaterally, no wheezes, rhonchi, or rales Cardiovascular: regular rate rhythm, S1, S2, no murmurs, rubs or gallops Abdomen: Soft, non-tender, non-distended, normoactive bowel sounds, no HSM Extremities: warm and well perfused, no clubbing, cyanosis, or edema Skin: No rashes or lesions Neuro: Non-focal ECOG PERFORMANCE STATUS: 1 - Symptomatic but completely ambulatory Breasts: unable to palpate left breast mass, no nodularity, right breast: no masses or nodularity  STUDIES/RESULTS: No results found.   LABS:    Chemistry      Component Value Date/Time   NA 130* 04/17/2012 1419   K 2.9* 04/17/2012 1419   CL 89* 04/17/2012 1419   CO2 30* 04/17/2012 1419   BUN 17.0 04/17/2012 1419   CREATININE 0.9 04/17/2012 1419      Component Value Date/Time   CALCIUM 8.5 04/17/2012 1419   ALKPHOS 83 04/17/2012 1419   AST 40* 04/17/2012 1419   ALT 78* 04/17/2012 1419   BILITOT 1.71* 04/17/2012 1419      Lab Results  Component Value Date   WBC 0.8* 04/17/2012   HGB 9.0* 04/17/2012   HCT 31.3* 04/17/2012   MCV 111.0* 04/17/2012   PLT 133* 04/17/2012        PATHOLOGY:  ADDITIONAL INFORMATION: PROGNOSTIC INDICATORS - ACIS Results IMMUNOHISTOCHEMICAL AND MORPHOMETRIC ANALYSIS BY THE AUTOMATED CELLULAR IMAGING SYSTEM (ACIS) Estrogen Receptor (Negative, <1%): 0%, NEGATIVE Progesterone Receptor (Negative, <1%): 0%, NEGATIVE Proliferation Marker Ki67 by M IB-1 (Low<20%): 98% All controls stained appropriately Pecola Leisure MD Pathologist, Electronic Signature ( Signed 02/15/2012) CHROMOGENIC IN-SITU HYBRIDIZATION Interpretation: HER2/NEU BY CISH - SHOWS AMPLIFICATION BY CISH ANALYSIS. THE RATIO OF HER2: CEP 17 SIGNALS WAS 2.55. 40 TUMOR CELLS WERE SCORED. OF NOTE, THE TUMOR APPEARS TO SHOW HETEROGENEITY IN REGARDS TO HER2 EXPRESSION. Reference range: Ratio: HER2:CEP17 < 1.8 gene amplification not observed Ratio: HER2:CEP 17 1.8-2.2 - equivocal result Ratio: HER2:CEP17 > 2.2 - gene amplification observed Pecola Leisure MD Pathologist, Electronic Signature ( Signed 02/15/2012) 1 of ADDITIONAL INFORMATION: PROGNOSTIC INDICATORS - ACIS Results IMMUNOHISTOCHEMICAL AND MORPHOMETRIC ANALYSIS BY THE AUTOMATED CELLULAR IMAGING SYSTEM (ACIS) Estrogen Receptor (Negative, <1%): 0%, NEGATIVE Progesterone Receptor (Negative, <1%): 0%, NEGATIVE COMMENT: The negative hormone receptor study(ies) in this case have an internal positive control. All controls stained appropriately Abigail Miyamoto MD Pathologist, Electronic Signature ( Signed 02/22/2012)  ASSESSMENT #1 Stage 1 HER-2 positive left breast cancer ER negative PR negative.  Patient is being considered for neoadjuvant chemotherapy consisting of Taxotere carboplatinum and herceptin as she does want breast conservation.  She understands the risks and benefits of this approach and the side effects of her chemotherapy and Herceptin.  #2 She understands she will receive neulasta the day after chemotherapy to keep her white count up.    #3 She does have thrombocytopenia and we will monitor  this  #4 Rash  #5 Nausea and dehydration  #6 Hypokalemia  PLAN:  #1 Maria Mckinney will proceed with her Herceptin tomorrow and IV fluids today and tomorrow.  I will add on KCL as a supplement to receive IV.    #2 she will receive IV fluids today, and tomorrow.  I also added Phenergan to her orders, counseled her on her neutropenia, and a better anti-emetic regimen.    #3 Her rash has improved.  From now on we will not put her  on antibiotics for her neutropenia and only monitor.    #4 We will see her back next week for her weekly herceptin.    All questions were answered. The patient knows to call the clinic with any problems, questions or concerns. We can certainly see the patient much sooner if necessary.  I spent 25 minutes counseling the patient face to face. The total time spent in the appointment was 30 minutes.  Cherie Ouch Lyn Hollingshead, NP Medical Oncology [facility] Phone: [phone] 04/18/2012, 11:57 AM

## 2012-04-17 NOTE — Patient Instructions (Addendum)
Dehydration, Adult Dehydration is when you lose more fluids from the body than you take in. Vital organs like the kidneys, brain, and heart cannot function without a proper amount of fluids and salt. Any loss of fluids from the body can cause dehydration.  CAUSES   Vomiting.  Diarrhea.  Excessive sweating.  Excessive urine output.  Fever. SYMPTOMS  Mild dehydration  Thirst.  Dry lips.  Slightly dry mouth. Moderate dehydration  Very dry mouth.  Sunken eyes.  Skin does not bounce back quickly when lightly pinched and released.  Dark urine and decreased urine production.  Decreased tear production.  Headache. Severe dehydration  Very dry mouth.  Extreme thirst.  Rapid, weak pulse (more than 100 beats per minute at rest).  Cold hands and feet.  Not able to sweat in spite of heat and temperature.  Rapid breathing.  Blue lips.  Confusion and lethargy.  Difficulty being awakened.  Minimal urine production.  No tears. DIAGNOSIS  Your caregiver will diagnose dehydration based on your symptoms and your exam. Blood and urine tests will help confirm the diagnosis. The diagnostic evaluation should also identify the cause of dehydration. TREATMENT  Treatment of mild or moderate dehydration can often be done at home by increasing the amount of fluids that you drink. It is best to drink small amounts of fluid more often. Drinking too much at one time can make vomiting worse. Refer to the home care instructions below. Severe dehydration needs to be treated at the hospital where you will probably be given intravenous (IV) fluids that contain water and electrolytes. HOME CARE INSTRUCTIONS   Ask your caregiver about specific rehydration instructions.  Drink enough fluids to keep your urine clear or pale yellow.  Drink small amounts frequently if you have nausea and vomiting.  Eat as you normally do.  Avoid:  Foods or drinks high in sugar.  Carbonated  drinks.  Juice.  Extremely hot or cold fluids.  Drinks with caffeine.  Fatty, greasy foods.  Alcohol.  Tobacco.  Overeating.  Gelatin desserts.  Wash your hands well to avoid spreading bacteria and viruses.  Only take over-the-counter or prescription medicines for pain, discomfort, or fever as directed by your caregiver.  Ask your caregiver if you should continue all prescribed and over-the-counter medicines.  Keep all follow-up appointments with your caregiver. SEEK MEDICAL CARE IF:  You have abdominal pain and it increases or stays in one area (localizes).  You have a rash, stiff neck, or severe headache.  You are irritable, sleepy, or difficult to awaken.  You are weak, dizzy, or extremely thirsty. SEEK IMMEDIATE MEDICAL CARE IF:   You are unable to keep fluids down or you get worse despite treatment.  You have frequent episodes of vomiting or diarrhea.  You have blood or green matter (bile) in your vomit.  You have blood in your stool or your stool looks black and tarry.  You have not urinated in 6 to 8 hours, or you have only urinated a small amount of very dark urine.  You have a fever.  You faint. MAKE SURE YOU:   Understand these instructions.  Will watch your condition.  Will get help right away if you are not doing well or get worse. Document Released: 04/18/2005 Document Revised: 07/11/2011 Document Reviewed: 12/06/2010 [employer] Patient Information 2013 [employer].  

## 2012-04-18 ENCOUNTER — Ambulatory Visit: Payer: BC Managed Care – PPO | Admitting: Adult Health

## 2012-04-18 ENCOUNTER — Other Ambulatory Visit: Payer: BC Managed Care – PPO | Admitting: Lab

## 2012-04-18 ENCOUNTER — Other Ambulatory Visit: Payer: Self-pay | Admitting: *Deleted

## 2012-04-18 ENCOUNTER — Ambulatory Visit (HOSPITAL_BASED_OUTPATIENT_CLINIC_OR_DEPARTMENT_OTHER): Payer: BC Managed Care – PPO

## 2012-04-18 VITALS — BP 131/63 | HR 87 | Temp 98.0°F

## 2012-04-18 DIAGNOSIS — Z5112 Encounter for antineoplastic immunotherapy: Secondary | ICD-10-CM

## 2012-04-18 DIAGNOSIS — Z8639 Personal history of other endocrine, nutritional and metabolic disease: Secondary | ICD-10-CM

## 2012-04-18 DIAGNOSIS — E876 Hypokalemia: Secondary | ICD-10-CM

## 2012-04-18 DIAGNOSIS — C50419 Malignant neoplasm of upper-outer quadrant of unspecified female breast: Secondary | ICD-10-CM

## 2012-04-18 MED ORDER — HEPARIN SOD (PORK) LOCK FLUSH 100 UNIT/ML IV SOLN
500.0000 [IU] | Freq: Once | INTRAVENOUS | Status: AC | PRN
  Administered 2012-04-18: 500 [IU]
  Filled 2012-04-18: qty 5

## 2012-04-18 MED ORDER — DIPHENHYDRAMINE HCL 25 MG PO CAPS
50.0000 mg | ORAL_CAPSULE | Freq: Once | ORAL | Status: AC
  Administered 2012-04-18: 50 mg via ORAL

## 2012-04-18 MED ORDER — SODIUM CHLORIDE 0.9 % IV SOLN
Freq: Once | INTRAVENOUS | Status: AC
  Administered 2012-04-18: 13:00:00 via INTRAVENOUS
  Filled 2012-04-18: qty 1000

## 2012-04-18 MED ORDER — PROMETHAZINE HCL 25 MG PO TABS: 25.0000 mg | ORAL_TABLET | Freq: Four times a day (QID) | ORAL | Status: DC | PRN

## 2012-04-18 MED ORDER — SODIUM CHLORIDE 0.9 % IV SOLN
Freq: Once | INTRAVENOUS | Status: AC
  Administered 2012-04-18: 11:00:00 via INTRAVENOUS

## 2012-04-18 MED ORDER — POTASSIUM CHLORIDE 20 MEQ/15ML (10%) PO SOLN: 40.0000 meq | Freq: Every day | ORAL | Status: DC

## 2012-04-18 MED ORDER — TRASTUZUMAB CHEMO INJECTION 440 MG
2.0000 mg/kg | Freq: Once | INTRAVENOUS | Status: AC
  Administered 2012-04-18: 189 mg via INTRAVENOUS
  Filled 2012-04-18: qty 9

## 2012-04-18 MED ORDER — SODIUM CHLORIDE 0.9 % IJ SOLN
10.0000 mL | INTRAMUSCULAR | Status: DC | PRN
  Administered 2012-04-18: 10 mL
  Filled 2012-04-18: qty 10

## 2012-04-18 MED ORDER — ACETAMINOPHEN 325 MG PO TABS
650.0000 mg | ORAL_TABLET | Freq: Once | ORAL | Status: AC
  Administered 2012-04-18: 650 mg via ORAL

## 2012-04-18 NOTE — Progress Notes (Signed)
Ok to treat with Herceptin at current lab values per Dr. Welton Flakes.  TKF

## 2012-04-18 NOTE — Patient Instructions (Addendum)
[facility] Discharge Instructions for Patients Receiving Chemotherapy  Today you received the following chemotherapy agents:herceptin  To help prevent nausea and vomiting after your treatment, we encourage you to take your nausea medication. Take it as often as prescribed.     If you develop nausea and vomiting that is not controlled by your nausea medication, call the clinic. If it is after clinic hours your family physician or the after hours number for the clinic or go to the Emergency Department.   BELOW ARE SYMPTOMS THAT SHOULD BE REPORTED IMMEDIATELY:  *FEVER GREATER THAN 100.5 F  *CHILLS WITH OR WITHOUT FEVER  NAUSEA AND VOMITING THAT IS NOT CONTROLLED WITH YOUR NAUSEA MEDICATION  *UNUSUAL SHORTNESS OF BREATH  *UNUSUAL BRUISING OR BLEEDING  TENDERNESS IN MOUTH AND THROAT WITH OR WITHOUT PRESENCE OF ULCERS  *URINARY PROBLEMS  *BOWEL PROBLEMS  UNUSUAL RASH Items with * indicate a potential emergency and should be followed up as soon as possible.  Feel free to call the clinic you have any questions or concerns. The clinic phone number is [phone].   I have been informed and understand all the instructions given to me. I know to contact the clinic, my physician, or go to the Emergency Department if any problems should occur. I do not have any questions at this time, but understand that I may call the clinic during office hours   should I have any questions or need assistance in obtaining follow up care.    __________________________________________  _____________  __________ Signature of Patient or Authorized Representative            Date                   Time    __________________________________________ Nurse's Signature         Hypokalemia Hypokalemia means a low potassium level in the blood.Potassium is an electrolyte that helps regulate the amount of fluid in the body. It also stimulates muscle contraction and maintains a stable  acid-base balance.Most of the body's potassium is inside of cells, and only a very small amount is in the blood. Because the amount in the blood is so small, minor changes can have big effects. PREPARATION FOR TEST Testing for potassium requires taking a blood sample taken by needle from a vein in the arm. The skin is cleaned thoroughly before the sample is drawn. There is no other special preparation needed. NORMAL VALUES Potassium levels below 3.5 mEq/L are abnormally low. Levels above 5.1 mEq/L are abnormally high. Ranges for normal findings may vary among different laboratories and hospitals. You should always check with your doctor after having lab work or other tests done to discuss the meaning of your test results and whether your values are considered within normal limits. MEANING OF TEST  Your caregiver will go over the test results with you and discuss the importance and meaning of your results, as well as treatment options and the need for additional tests, if necessary. A potassium level is frequently part of a routine medical exam. It is usually included as part of a whole "panel" of tests for several blood salts (such as Sodium and Chloride). It may be done as part of follow-up when a low potassium level was found in the past or other blood salts are suspected of being out of balance. A low potassium level might be suspected if you have one or more of the following: Symptoms of weakness. Abnormal heart rhythms. High blood  pressure and are taking medication to control this, especially water pills (diuretics). Kidney disease that can affect your potassium level . Diabetes requiring the use of insulin. The potassium may fall after taking insulin, especially if the diabetes had been out of control for a while. A condition requiring the use of cortisone-type medication or certain types of antibiotics. Vomiting and/or diarrhea for more than a day or two. A stomach or intestinal condition  that may not permit appropriate absorption of potassium. Fainting episodes. Mental confusion. OBTAINING TEST RESULTS It is your responsibility to obtain your test results. Ask the lab or department performing the test when and how you will get your results.  Please contact your caregiver directly if you have not received the results within one week. At that time, ask if there is anything different or new you should be doing in relation to the results. TREATMENT Hypokalemia can be treated with potassium supplements taken by mouth and/or adjustments in your current medications. A diet high in potassium is also helpful. Foods with high potassium content are: Peas, lentils, lima beans, nuts, and dried fruit. Whole grain and bran cereals and breads. Fresh fruit, vegetables (bananas, cantaloupe, grapefruit, oranges, tomatoes, honeydew melons, potatoes). Orange and tomato juices. Meats. If potassium supplement has been prescribed for you today or your medications have been adjusted, see your personal caregiver in time02 for a re-check. SEEK MEDICAL CARE IF: There is a feeling of worsening weakness. You experience repeated chest palpitations. You are diabetic and having difficulty keeping your blood sugars in the normal range. You are experiencing vomiting and/or diarrhea. You are having difficulty with any of your regular medications. SEEK IMMEDIATE MEDICAL CARE IF: You experience chest pain, shortness of breath, or episodes of dizziness. You have been having vomiting or diarrhea for more than 2 days. You have a fainting episode. MAKE SURE YOU:  Understand these instructions. Will watch your condition. Will get help right away if you are not doing well or get worse. Document Released: 04/18/2005 Document Revised: 07/11/2011 Document Reviewed: 03/29/2008 [facility] Patient Information 2013 [place], [place].   Potassium Content of Foods Potassium is a mineral. The body needs potassium to  control blood pressure and to keep the muscles and nervous system healthy. Most foods contain potassium. Eating a variety of foods in the right amounts will help control the level of potassium in your body. Kidney problems can cause there to be too much potassium in the body. If this happens, you may need to lower the amount of potassium in your diet. Some medicines, such as diuretics, may cause your body to lose too much potassium. If this happens, you may need to increase the amount of potassium in your diet. COMMON SERVING SIZES The list below tells you how big or small some common portion sizes are: 1 oz.........4 stacked dice. 3 oz........Marland KitchenDeck of cards. 1 tsp.......Marland KitchenTip of little finger. 1 tbs......Marland KitchenMarland KitchenThumb. 2 tbs.......Marland KitchenGolf ball.  cup......Marland KitchenHalf of a fist. 1 cup.......Marland KitchenA fist. FOODS AND DRINKS HIGH IN POTASSIUM More than 250 mg per serving. Bran cereals and other bran products. Milk (skim, 1%, 2%, whole). Buttermilk. Yogurt. Avocados. Bananas. Dried fruits. Kiwis. Oranges. Prunes. Raisins. Baked beans. Spinach. Tomatoes. Peanut butter. Nuts. Tofu. Potatoes. FOODS MODERATE IN POTASSIUM Between 150 to 250 mg per serving. Cherries. Mangoes. Asparagus. Peas. Zucchini. Celery. Cantaloupes. Fresh peaches. Broccoli stalks. Peppers. Figs. Fresh pears. Kale. Summer squashes. FOODS LOW IN POTASSIUM Less than 150 mg per serving. Pasta. Rice. Cottage cheese. Cheddar cheese. Apples. Grapes. Pineapple. Raspberries. Strawberries. Watermelon.  Green beans. Cabbage. Cauliflower. Corn. Mushrooms. Onions. Eggs. Document Released: 11/30/2004 Document Revised: 07/11/2011 Document Reviewed: 09/02/2011 [facility] Patient Information 2013 [place], [place].   Diarrhea Infections caused by germs (bacterial) or a virus commonly cause diarrhea. Your caregiver has determined that with time, rest and fluids, the diarrhea should improve. In general, eat normally while  drinking more water than usual. Although water may prevent dehydration, it does not contain salt and minerals (electrolytes). Broths, weak tea without caffeine and oral rehydration solutions (ORS) replace fluids and electrolytes. Small amounts of fluids should be taken frequently. Large amounts at one time may not be tolerated. Plain water may be harmful in infants and the elderly. Oral rehydrating solutions (ORS) are available at pharmacies and grocery stores. ORS replace water and important electrolytes in proper proportions. Sports drinks are not as effective as ORS and may be harmful due to sugars worsening diarrhea.  ORS is especially recommended for use in children with diarrhea. As a general guideline for children, replace any new fluid losses from diarrhea and/or vomiting with ORS as follows:  If your child weighs 22 pounds or under (10 kg or less), give 60-120 mL ( -  cup or 2 - 4 ounces) of ORS for each episode of diarrheal stool or vomiting episode.  If your child weighs more than 22 pounds (more than 10 kgs), give 120-240 mL ( - 1 cup or 4 - 8 ounces) of ORS for each diarrheal stool or episode of vomiting.  While correcting for dehydration, children should eat normally. However, foods high in sugar should be avoided because this may worsen diarrhea. Large amounts of carbonated soft drinks, juice, gelatin desserts and other highly sugared drinks should be avoided.  After correction of dehydration, other liquids that are appealing to the child may be added. Children should drink small amounts of fluids frequently and fluids should be increased as tolerated. Children should drink enough fluids to keep urine clear or pale yellow.  Adults should eat normally while drinking more fluids than usual. Drink small amounts of fluids frequently and increase as tolerated. Drink enough fluids to keep urine clear or pale yellow. Broths, weak decaffeinated tea, lemon lime soft drinks (allowed to go flat)  and ORS replace fluids and electrolytes.  Avoid:  Carbonated drinks.  Juice.  Extremely hot or cold fluids.  Caffeine drinks.  Fatty, greasy foods.  Alcohol.  Tobacco.  Too much intake of anything at one time.  Gelatin desserts.  Probiotics are active cultures of beneficial bacteria. They may lessen the amount and number of diarrheal stools in adults. Probiotics can be found in yogurt with active cultures and in supplements.  Wash hands well to avoid spreading bacteria and virus.  Anti-diarrheal medications are not recommended for infants and children.  Only take over-the-counter or prescription medicines for pain, discomfort or fever as directed by your caregiver. Do not give aspirin to children because it may cause Reye's Syndrome.  For adults, ask your caregiver if you should continue all prescribed and over-the-counter medicines.  If your caregiver has given you a follow-up appointment, it is very important to keep that appointment. Not keeping the appointment could result in a chronic or permanent injury, and disability. If there is any problem keeping the appointment, you must call back to this facility for assistance. SEEK IMMEDIATE MEDICAL CARE IF:   You or your child is unable to keep fluids down or other symptoms or problems become worse in spite of treatment.  Vomiting or diarrhea develops  and becomes persistent.  There is vomiting of blood or bile (green material).  There is blood in the stool or the stools are black and tarry.  There is no urine output in 6-8 hours or there is only a small amount of very dark urine.  Abdominal pain develops, increases or localizes.  You have a fever.  Your baby is older than 3 months with a rectal temperature of 102 F (38.9 C) or higher.  Your baby is 60 months old or younger with a rectal temperature of 100.4 F (38 C) or higher.  You or your child develops excessive weakness, dizziness, fainting or extreme  thirst.  You or your child develops a rash, stiff neck, severe headache or become irritable or sleepy and difficult to awaken. MAKE SURE YOU:   Understand these instructions.  Will watch your condition.  Will get help right away if you are not doing well or get worse. Document Released: 04/08/2002 Document Revised: 07/11/2011 Document Reviewed: 02/23/2009 [facility] Patient Information 2013 [place], [place].   Constipation, Adult Constipation is when a person has fewer than 3 bowel movements a week; has difficulty having a bowel movement; or has stools that are dry, hard, or larger than normal. As people grow older, constipation is more common. If you try to fix constipation with medicines that make you have a bowel movement (laxatives), the problem may get worse. Long-term laxative use may cause the muscles of the colon to become weak. A low-fiber diet, not taking in enough fluids, and taking certain medicines may make constipation worse. CAUSES   Certain medicines, such as antidepressants, pain medicine, iron supplements, antacids, and water pills.   Certain diseases, such as diabetes, irritable bowel syndrome (IBS), thyroid disease, or depression.   Not drinking enough water.   Not eating enough fiber-rich foods.   Stress or travel.  Lack of physical activity or exercise.  Not going to the restroom when there is the urge to have a bowel movement.  Ignoring the urge to have a bowel movement.  Using laxatives too much. SYMPTOMS   Having fewer than 3 bowel movements a week.   Straining to have a bowel movement.   Having hard, dry, or larger than normal stools.   Feeling full or bloated.   Pain in the lower abdomen.  Not feeling relief after having a bowel movement. DIAGNOSIS  Your caregiver will take a medical history and perform a physical exam. Further testing may be done for severe constipation. Some tests may include:   A barium enema X-ray to examine your  rectum, colon, and sometimes, your small intestine.  A sigmoidoscopy to examine your lower colon.  A colonoscopy to examine your entire colon. TREATMENT  Treatment will depend on the severity of your constipation and what is causing it. Some dietary treatments include drinking more fluids and eating more fiber-rich foods. Lifestyle treatments may include regular exercise. If these diet and lifestyle recommendations do not help, your caregiver may recommend taking over-the-counter laxative medicines to help you have bowel movements. Prescription medicines may be prescribed if over-the-counter medicines do not work.  HOME CARE INSTRUCTIONS   Increase dietary fiber in your diet, such as fruits, vegetables, whole grains, and beans. Limit high-fat and processed sugars in your diet, such as [place] fries, hamburgers, cookies, candies, and soda.   A fiber supplement may be added to your diet if you cannot get enough fiber from foods.   Drink enough fluids to keep your urine clear or pale  yellow.   Exercise regularly or as directed by your caregiver.   Go to the restroom when you have the urge to go. Do not hold it.  Only take medicines as directed by your caregiver. Do not take other medicines for constipation without talking to your caregiver first. SEEK IMMEDIATE MEDICAL CARE IF:   You have bright red blood in your stool.   Your constipation lasts for more than 4 days or gets worse.   You have abdominal or rectal pain.   You have thin, pencil-like stools.  You have unexplained weight loss. MAKE SURE YOU:   Understand these instructions.  Will watch your condition.  Will get help right away if you are not doing well or get worse. Document Released: 01/15/2004 Document Revised: 07/11/2011 Document Reviewed: 03/22/2011 [facility] Patient Information 2013 [place], [place].

## 2012-04-20 ENCOUNTER — Telehealth: Payer: Self-pay | Admitting: *Deleted

## 2012-04-20 NOTE — Telephone Encounter (Signed)
PT. HAS NOT TRIED ANYTHING TO STOP THE DIARRHEA. SPOKE TO DR.KHAN'S NURSE, Garald Braver. INSTRUCTED PT. TO USE IMODIUM AS DIRECTED ON THE BOX. SHE IS ALSO TO FORCE FLUIDS. IF THE DIARRHEA DOES NOT IMPROVE PT. WILL GO TO THE EMERGENCY ROOM TO BE EVALUATED. PT. VOICES UNDERSTANDING.

## 2012-04-23 ENCOUNTER — Telehealth: Payer: Self-pay | Admitting: *Deleted

## 2012-04-23 ENCOUNTER — Encounter: Payer: Self-pay | Admitting: *Deleted

## 2012-04-23 NOTE — Telephone Encounter (Signed)
Agree 

## 2012-04-23 NOTE — Progress Notes (Signed)
Mailed after appt letter to pt. 

## 2012-04-23 NOTE — Telephone Encounter (Signed)
Pt called c/o weakness, unable to eat. Instructed pt to continue to try and eat 5-6 small meals throughout the day, maybe some toast this am, 1/2 a grilled cheese in about 2-3 hrs wait a little while eat other half of grilled cheese maybe add some chicken noodle soup or veg. soup, try to snack on crackers/pretzels. For dinner try some chicken and rice. Pt then discussed other options for meals and verbalized she would try to eat small portions more times a day.Pt to drink plenty of fluids, not to use laxatives as she previously had diarrhea.Encouraged pt to try eating more fruit to help with natural digestion, prunes/apple juice. Pt advised she did take Immodium over the weekend and had small BM on 12/22. Pt also complained of "drainage around eyes, matted closed when woke up this weekend and removed the matted drainage by pulling skin off around her eyes." Recommended pt try using warm water/wash cloth to remove matted drainage from eye area as this is less abrasive on skin than "pulling". Recommended pt apply triple antibiotic ointment to skin around eyes if there is tearing, to be careful not to get in her eyes. Pt verbalized understanding advised she did recently had a sinus issue and took some sudafed which helped clear the drainage up as well. Pt was able to re-verbalize the above instructions regarding intake/output and how to remove any matted drainage from eyes without causing tearing of the skin. No further questions at this time. Confirmed with pt appt on 12/26. Concerns forwarded to Providers.

## 2012-04-24 ENCOUNTER — Other Ambulatory Visit: Payer: BC Managed Care – PPO | Admitting: Lab

## 2012-04-26 ENCOUNTER — Other Ambulatory Visit: Payer: BC Managed Care – PPO | Admitting: Lab

## 2012-04-26 ENCOUNTER — Encounter (HOSPITAL_COMMUNITY)
Admission: RE | Admit: 2012-04-26 | Discharge: 2012-04-26 | Disposition: A | Discharge status: Home / Self Care | Payer: BC Managed Care – PPO | Source: Ambulatory Visit | Attending: Oncology | Admitting: Oncology

## 2012-04-26 ENCOUNTER — Other Ambulatory Visit (HOSPITAL_BASED_OUTPATIENT_CLINIC_OR_DEPARTMENT_OTHER): Payer: BC Managed Care – PPO | Admitting: Lab

## 2012-04-26 ENCOUNTER — Telehealth: Payer: Self-pay | Admitting: *Deleted

## 2012-04-26 ENCOUNTER — Ambulatory Visit (HOSPITAL_BASED_OUTPATIENT_CLINIC_OR_DEPARTMENT_OTHER): Payer: BC Managed Care – PPO

## 2012-04-26 ENCOUNTER — Encounter: Payer: Self-pay | Admitting: Adult Health

## 2012-04-26 ENCOUNTER — Ambulatory Visit (HOSPITAL_BASED_OUTPATIENT_CLINIC_OR_DEPARTMENT_OTHER): Payer: BC Managed Care – PPO | Admitting: Adult Health

## 2012-04-26 VITALS — BP 111/65 | HR 92 | Temp 98.6°F | Resp 20 | Ht 61.0 in | Wt 187.5 lb

## 2012-04-26 VITALS — BP 136/78 | HR 74 | Temp 98.0°F | Resp 18

## 2012-04-26 DIAGNOSIS — Z5112 Encounter for antineoplastic immunotherapy: Secondary | ICD-10-CM

## 2012-04-26 DIAGNOSIS — C50419 Malignant neoplasm of upper-outer quadrant of unspecified female breast: Secondary | ICD-10-CM

## 2012-04-26 DIAGNOSIS — E876 Hypokalemia: Secondary | ICD-10-CM

## 2012-04-26 DIAGNOSIS — D649 Anemia, unspecified: Secondary | ICD-10-CM | POA: Insufficient documentation

## 2012-04-26 DIAGNOSIS — Z7969 Long term (current) use of other immunomodulators and immunosuppressants: Secondary | ICD-10-CM

## 2012-04-26 DIAGNOSIS — R0602 Shortness of breath: Secondary | ICD-10-CM

## 2012-04-26 DIAGNOSIS — Z79899 Other long term (current) drug therapy: Secondary | ICD-10-CM

## 2012-04-26 DIAGNOSIS — D696 Thrombocytopenia, unspecified: Secondary | ICD-10-CM

## 2012-04-26 LAB — CBC WITH DIFFERENTIAL/PLATELET
Basophils Absolute: 0 10*3/uL (ref 0.0–0.1)
EOS%: 0 % (ref 0.0–7.0)
Eosinophils Absolute: 0 10*3/uL (ref 0.0–0.5)
LYMPH%: 25.6 % (ref 14.0–49.7)
MCH: 33.5 pg (ref 25.1–34.0)
MCV: 94.3 fL (ref 79.5–101.0)
MONO%: 6 % (ref 0.0–14.0)
NEUT#: 4.4 10*3/uL (ref 1.5–6.5)
Platelets: 85 10*3/uL — ABNORMAL LOW (ref 145–400)
RBC: 2.35 10*6/uL — ABNORMAL LOW (ref 3.70–5.45)
RDW: 20.4 % — ABNORMAL HIGH (ref 11.2–14.5)

## 2012-04-26 LAB — COMPREHENSIVE METABOLIC PANEL (CC13)
AST: 38 U/L — ABNORMAL HIGH (ref 5–34)
Alkaline Phosphatase: 87 U/L (ref 40–150)
BUN: 8 mg/dL (ref 7.0–26.0)
Glucose: 111 mg/dl — ABNORMAL HIGH (ref 70–99)
Sodium: 136 mEq/L (ref 136–145)
Total Bilirubin: 1.04 mg/dL (ref 0.20–1.20)

## 2012-04-26 MED ORDER — ACETAMINOPHEN 325 MG PO TABS
650.0000 mg | ORAL_TABLET | Freq: Once | ORAL | Status: AC
  Administered 2012-04-26: 650 mg via ORAL

## 2012-04-26 MED ORDER — TRASTUZUMAB CHEMO INJECTION 440 MG
2.0000 mg/kg | Freq: Once | INTRAVENOUS | Status: AC
  Administered 2012-04-26: 189 mg via INTRAVENOUS
  Filled 2012-04-26: qty 9

## 2012-04-26 MED ORDER — SODIUM CHLORIDE 0.9 % IV SOLN
Freq: Once | INTRAVENOUS | Status: AC
  Administered 2012-04-26: 10:00:00 via INTRAVENOUS

## 2012-04-26 MED ORDER — SODIUM CHLORIDE 0.9 % IJ SOLN
10.0000 mL | INTRAMUSCULAR | Status: DC | PRN
  Administered 2012-04-26: 10 mL
  Filled 2012-04-26: qty 10

## 2012-04-26 MED ORDER — HEPARIN SOD (PORK) LOCK FLUSH 100 UNIT/ML IV SOLN
500.0000 [IU] | Freq: Once | INTRAVENOUS | Status: AC | PRN
  Administered 2012-04-26: 500 [IU]
  Filled 2012-04-26: qty 5

## 2012-04-26 MED ORDER — POTASSIUM CHLORIDE CRYS ER 20 MEQ PO TBCR
40.0000 meq | EXTENDED_RELEASE_TABLET | Freq: Once | ORAL | Status: AC
  Administered 2012-04-26: 40 meq via ORAL
  Filled 2012-04-26: qty 2

## 2012-04-26 MED ORDER — DIPHENHYDRAMINE HCL 25 MG PO CAPS
50.0000 mg | ORAL_CAPSULE | Freq: Once | ORAL | Status: AC
  Administered 2012-04-26: 50 mg via ORAL

## 2012-04-26 MED ORDER — SODIUM CHLORIDE 0.45 % IV SOLN
INTRAVENOUS | Status: DC
  Administered 2012-04-26: 14:00:00 via INTRAVENOUS
  Filled 2012-04-26: qty 500

## 2012-04-26 MED ORDER — POTASSIUM CHLORIDE CRYS ER 20 MEQ PO TBCR: EXTENDED_RELEASE_TABLET | ORAL | Status: DC

## 2012-04-26 NOTE — Patient Instructions (Addendum)
Doing well.  You will receive Herceptin and blood today.  I have ordered a chest x ray.  Please call us if you have any questions or concerns.

## 2012-04-26 NOTE — Patient Instructions (Addendum)
Blood Transfusion Information WHAT IS A BLOOD TRANSFUSION? A transfusion is the replacement of blood or some of its parts. Blood is made up of multiple cells which provide different functions.  Red blood cells carry oxygen and are used for blood loss replacement.  White blood cells fight against infection.  Platelets control bleeding.  Plasma helps clot blood.  Other blood products are available for specialized needs, such as hemophilia or other clotting disorders. BEFORE THE TRANSFUSION  Who gives blood for transfusions?   You may be able to donate blood to be used at a later date on yourself (autologous donation).  Relatives can be asked to donate blood. This is generally not any safer than if you have received blood from a stranger. The same precautions are taken to ensure safety when a relative's blood is donated.  Healthy volunteers who are fully evaluated to make sure their blood is safe. This is blood bank blood. Transfusion therapy is the safest it has ever been in the practice of medicine. Before blood is taken from a donor, a complete history is taken to make sure that person has no history of diseases nor engages in risky social behavior (examples are intravenous drug use or sexual activity with multiple partners). The donor's travel history is screened to minimize risk of transmitting infections, such as malaria. The donated blood is tested for signs of infectious diseases, such as HIV and hepatitis. The blood is then tested to be sure it is compatible with you in order to minimize the chance of a transfusion reaction. If you or a relative donates blood, this is often done in anticipation of surgery and is not appropriate for emergency situations. It takes many days to process the donated blood. RISKS AND COMPLICATIONS Although transfusion therapy is very safe and saves many lives, the main dangers of transfusion include:   Getting an infectious disease.  Developing a  transfusion reaction. This is an allergic reaction to something in the blood you were given. Every precaution is taken to prevent this. The decision to have a blood transfusion has been considered carefully by your caregiver before blood is given. Blood is not given unless the benefits outweigh the risks. AFTER THE TRANSFUSION  Right after receiving a blood transfusion, you will usually feel much better and more energetic. This is especially true if your red blood cells have gotten low (anemic). The transfusion raises the level of the red blood cells which carry oxygen, and this usually causes an energy increase.  The nurse administering the transfusion will monitor you carefully for complications. HOME CARE INSTRUCTIONS  No special instructions are needed after a transfusion. You may find your energy is better. Speak with your caregiver about any limitations on activity for underlying diseases you may have. SEEK MEDICAL CARE IF:   Your condition is not improving after your transfusion.  You develop redness or irritation at the intravenous (IV) site. SEEK IMMEDIATE MEDICAL CARE IF:  Any of the following symptoms occur over the next 12 hours:  Shaking chills.  You have a temperature by mouth above 102 F (38.9 C), not controlled by medicine.  Chest, back, or muscle pain.  People around you feel you are not acting correctly or are confused.  Shortness of breath or difficulty breathing.  Dizziness and fainting.  You get a rash or develop hives.  You have a decrease in urine output.  Your urine turns a dark color or changes to pink, red, or brown. Any of the following   symptoms occur over the next 10 days:  You have a temperature by mouth above 102 F (38.9 C), not controlled by medicine.  Shortness of breath.  Weakness after normal activity.  The white part of the eye turns yellow (jaundice).  You have a decrease in the amount of urine or are urinating less often.  Your  urine turns a dark color or changes to pink, red, or brown. Document Released: 04/15/2000 Document Revised: 07/11/2011 Document Reviewed: 12/03/2007 [facility] Patient Information 2013 [place], [place]. Hypokalemia Hypokalemia means a low potassium level in the blood.Potassium is an electrolyte that helps regulate the amount of fluid in the body. It also stimulates muscle contraction and maintains a stable acid-base balance.Most of the body's potassium is inside of cells, and only a very small amount is in the blood. Because the amount in the blood is so small, minor changes can have big effects. PREPARATION FOR TEST Testing for potassium requires taking a blood sample taken by needle from a vein in the arm. The skin is cleaned thoroughly before the sample is drawn. There is no other special preparation needed. NORMAL VALUES Potassium levels below 3.5 mEq/L are abnormally low. Levels above 5.1 mEq/L are abnormally high. Ranges for normal findings may vary among different laboratories and hospitals. You should always check with your doctor after having lab work or other tests done to discuss the meaning of your test results and whether your values are considered within normal limits. MEANING OF TEST  Your caregiver will go over the test results with you and discuss the importance and meaning of your results, as well as treatment options and the need for additional tests, if necessary. A potassium level is frequently part of a routine medical exam. It is usually included as part of a whole "panel" of tests for several blood salts (such as Sodium and Chloride). It may be done as part of follow-up when a low potassium level was found in the past or other blood salts are suspected of being out of balance. A low potassium level might be suspected if you have one or more of the following:  Symptoms of weakness.  Abnormal heart rhythms.  High blood pressure and are taking medication to control this,  especially water pills (diuretics).  Kidney disease that can affect your potassium level .  Diabetes requiring the use of insulin. The potassium may fall after taking insulin, especially if the diabetes had been out of control for a while.  A condition requiring the use of cortisone-type medication or certain types of antibiotics.  Vomiting and/or diarrhea for more than a day or two.  A stomach or intestinal condition that may not permit appropriate absorption of potassium.  Fainting episodes.  Mental confusion. OBTAINING TEST RESULTS It is your responsibility to obtain your test results. Ask the lab or department performing the test when and how you will get your results.  Please contact your caregiver directly if you have not received the results within one week. At that time, ask if there is anything different or new you should be doing in relation to the results. TREATMENT Hypokalemia can be treated with potassium supplements taken by mouth and/or adjustments in your current medications. A diet high in potassium is also helpful. Foods with high potassium content are:  Peas, lentils, lima beans, nuts, and dried fruit.  Whole grain and bran cereals and breads.  Fresh fruit, vegetables (bananas, cantaloupe, grapefruit, oranges, tomatoes, honeydew melons, potatoes).  Orange and tomato juices.  Meats.  If potassium supplement has been prescribed for you today or your medications have been adjusted, see your personal caregiver in time02 for a re-check. SEEK MEDICAL CARE IF:  There is a feeling of worsening weakness.  You experience repeated chest palpitations.  You are diabetic and having difficulty keeping your blood sugars in the normal range.  You are experiencing vomiting and/or diarrhea.  You are having difficulty with any of your regular medications. SEEK IMMEDIATE MEDICAL CARE IF:  You experience chest pain, shortness of breath, or episodes of dizziness.  You have  been having vomiting or diarrhea for more than 2 days.  You have a fainting episode. MAKE SURE YOU:   Understand these instructions.  Will watch your condition.  Will get help right away if you are not doing well or get worse. Document Released: 04/18/2005 Document Revised: 07/11/2011 Document Reviewed: 03/29/2008 [facility] Patient Information 2013 [place], [place].

## 2012-04-26 NOTE — Progress Notes (Signed)
Maria Mckinney [id number] August 24, 1950 61 y.o. 04/26/2012 10:22 AM  CC  Maria Ravel, MD Dr. Burnell Mckinney [address] [place] [address] Dr. Emelia Mckinney Dr. Lurline Mckinney Dr. 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  PRIOR THERAPY:  #1Patient was originally seen in the multidisciplinary breast clinic for new diagnosis of left breast cancer that was 8mm in the upper outer quadrant of the left breast.  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  There was an additional 4 cm area of normal signal.  Patient did not get an MRI secondary to cochlear implant.  She desires breast conservation.    #2 Patient is s/p port-a-cath placement for neoadjuvant chemotherapy consisting of Taxotere/carboplatinum/Herceptin.  She will begin cycle #1 on 02/29/2012.  She will receive Taxotere and carboplatinum every 21 days with Herceptin giving weekly.  She will receive a total of 4 cycles of Taxotere and Carboplatinum.    CURRENT THERAPY: C3 day 15 TCH with Neulasta support, (weekly herceptin)  INTERVAL HISTORY: Ms. Maria Mckinney is not feeling well today.  She states that in retrospect she has had dyspnea on exertion since her very first treatment and she feels winded with walking since then.  She states she hasn't told me this when asked about it in the past for no particular reason.  She's had no fevers.  She did have nasal drainage and matting of her eyes last week.  She took Sudafed, and Suphedrine PE which helped with her symptoms.  She is not coughing.  She has no lower extremity swelling or PND.  She did have 2 weeks ago, and took imodium to stop it.  She hasn't had a BM since Sunday, and she says that this is normal for her.  She has had occasional spots of blood on the toilet paper, but no blood in her urine,  stool, or from her gums and nose.  No melena.  She is eating soup and peaches, and otherwise hasn't been eating a lot due to decreased taste and appetite.  She has lost 5 lbs since 12/4.  Otherwise she's w/o questions/concerns.    Past Medical History: Past Medical History  Diagnosis Date  . HTN (hypertension)   . Fibromyalgia   . GERD (gastroesophageal reflux disease)   . Arthritis   . Anxiety   . Dysrhythmia     irreg hr  . Wears hearing aid     left ear  . 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 PONV (postoperative nausea and vomiting)     Past Surgical History: Past Surgical History  Procedure Date  . Appendectomy   . Combined hysteroscopy diagnostic / d&c   . Cochlear implant   . Portacath placement 02/22/2012    Procedure: INSERTION PORT-A-CATH;  Surgeon: Maria Loron, MD;  Location: [facility];  Service: General;  Laterality: Right;    Family History: Family History  Problem Relation Age of Onset  . Cancer Mother     "tumor of face removed"  . Cancer Father     kidney removed  . Cancer Maternal Aunt     stomach & Pancreatic  . Cancer Paternal Uncle     pancreatic    Social History History  Substance Use Topics  . Smoking status: Never Smoker   . Smokeless tobacco: Not on file  .  Alcohol Use: 0.0 oz/week    0-1 Glasses of wine per week    Allergies: Allergies  Allergen Reactions  . Acyclovir And Related   . Ciprofloxacin Hcl Rash    Current Medications: Current Outpatient Prescriptions  Medication Sig Dispense Refill  . Alum & Mag Hydroxide-Simeth (MAGIC MOUTHWASH W/LIDOCAINE) SOLN Take 5 mLs by mouth 4 (four) times daily.  200 mL  6  . bisoprolol-hydrochlorothiazide (ZIAC) 5-6.25 MG per tablet Take 1 tablet by mouth daily.      . chlorpheniramine (CHLOR-TRIMETON) 4 MG tablet Take 4 mg by mouth at bedtime as needed.      . cyclobenzaprine (FLEXERIL) 10 MG tablet Take 10 mg by mouth 3 (three) times daily as needed.      Marland Kitchen  dexamethasone (DECADRON) 4 MG tablet Take 2 tablets (8 mg total) by mouth 2 (two) times daily with a meal. Take two times a day the day before Taxotere. Then take two times a day starting the day after chemo for 3 days.  30 tablet  1  . DULoxetine (CYMBALTA) 60 MG capsule Take 60 mg by mouth daily.      Marland Kitchen levothyroxine (SYNTHROID, LEVOTHROID) 100 MCG tablet Take 100 mcg by mouth daily.      Marland Kitchen lidocaine-prilocaine (EMLA) cream Apply topically as needed.  30 g  4  . meloxicam (MOBIC) 15 MG tablet Take 15 mg by mouth daily.      . mometasone (NASONEX) 50 MCG/ACT nasal spray Place 2 sprays into the nose daily.      . ondansetron (ZOFRAN) 8 MG tablet Take 1 tablet (8 mg total) by mouth 2 (two) times daily. Take two times a day starting the day after chemo for 3 days. Then take two times a day as needed for nausea or vomiting.  30 tablet  1  . phenylephrine (SUDAFED PE) 10 MG TABS Take 10 mg by mouth at bedtime as needed.      . prochlorperazine (COMPAZINE) 10 MG tablet Take 1 tablet (10 mg total) by mouth every 6 (six) hours as needed (Nausea or vomiting).  30 tablet  1  . prochlorperazine (COMPAZINE) 25 MG suppository Place 1 suppository (25 mg total) rectally every 12 (twelve) hours as needed for nausea.  12 suppository  3  . promethazine (PHENERGAN) 25 MG tablet Take 1 tablet (25 mg total) by mouth every 6 (six) hours as needed for nausea.  60 tablet  0  . pseudoephedrine (SUDAFED) 30 MG tablet Take 30 mg by mouth every 4 (four) hours as needed.      . ranitidine (ZANTAC) 300 MG tablet Take 300 mg by mouth at bedtime.      Marland Kitchen UNABLE TO FIND Cranial Prothesis  1 each  0  . HYDROcodone-acetaminophen (VICODIN) 5-500 MG per tablet Take 1 tablet by mouth every 6 (six) hours as needed.  60 tablet  0  . LORazepam (ATIVAN) 0.5 MG tablet Take 1 tablet (0.5 mg total) by mouth every 6 (six) hours as needed (Nausea or vomiting).  30 tablet  0  . potassium chloride SA (K-DUR,KLOR-CON) 20 MEQ tablet 1 po daily x 7  days  7 tablet  0   No current facility-administered medications for this visit.   Facility-Administered Medications Ordered in Other Visits  Medication Dose Route Frequency Provider Last Rate Last Dose  . heparin lock flush 100 unit/mL  500 Units Intracatheter Once PRN Victorino December, MD      . sodium chloride 0.9 % injection  10 mL  10 mL Intracatheter PRN Victorino December, MD      . trastuzumab (HERCEPTIN) 189 mg in sodium chloride 0.9 % 250 mL chemo infusion  2 mg/kg (Treatment Plan Actual) Intravenous Once Victorino December, MD        Health Maintenance:  ColonoscopyShe has never had a colonoscopy Bone DensityBone density about 10-12 years ago Last PAP smearLast Pap smear was in 2012   REVIEW OF SYSTEMS: General: fatigue (+), night sweats (-), fever (-), pain (-) Lymph: palpable nodes (-) HEENT: vision changes (-), mucositis (-), gum bleeding (-), epistaxis (-) Cardiovascular: chest pain (-), palpitations (-) Pulmonary: shortness of breath (-), dyspnea on exertion (+), cough (-), hemoptysis (-) GI:  Early satiety (-), melena (-), dysphagia (-), nausea/vomiting (+), diarrhea (+) GU: dysuria (-), hematuria (-), incontinence (-) Musculoskeletal: joint swelling (-), joint pain (-), back pain (-) Neuro: weakness (-), numbness (-), headache (-), confusion (-) Skin: Rash (-), lesions (-), dryness (-) Psych: depression (-), suicidal/homicidal ideation (-), feeling of hopelessness (-)   PHYSICAL EXAMINATION: Blood pressure 111/65, pulse 92, temperature 98.6 F (37 C), resp. rate 20, height 5\' 1"  (1.549 m), weight 187 lb 8 oz (85.049 kg). General: Patient is a well appearing female in no acute distress HEENT: PERRLA, sclerae anicteric no conjunctival pallor, MMM, Neck: supple, no palpable adenopathy Lungs: clear to auscultation bilaterally, no wheezes, rhonchi, or rales Cardiovascular: regular rate rhythm, S1, S2, no murmurs, rubs or gallops Abdomen: Soft, non-tender, non-distended,  normoactive bowel sounds, no HSM Extremities: warm and well perfused, no clubbing, cyanosis, or edema Skin: No rashes or lesions Neuro: Non-focal ECOG PERFORMANCE STATUS: 1 - Symptomatic but completely ambulatory Breasts: unable to palpate left breast mass, no nodularity, right breast: no masses or nodularity  STUDIES/RESULTS: No results found.   LABS:    Chemistry      Component Value Date/Time   NA 130* 04/17/2012 1419   K 2.9* 04/17/2012 1419   CL 89* 04/17/2012 1419   CO2 30* 04/17/2012 1419   BUN 17.0 04/17/2012 1419   CREATININE 0.9 04/17/2012 1419      Component Value Date/Time   CALCIUM 8.5 04/17/2012 1419   ALKPHOS 83 04/17/2012 1419   AST 40* 04/17/2012 1419   ALT 78* 04/17/2012 1419   BILITOT 1.71* 04/17/2012 1419      Lab Results  Component Value Date   WBC 6.4 04/26/2012   HGB 7.9* 04/26/2012   HCT 22.1* 04/26/2012   MCV 94.3 04/26/2012   PLT 85* 04/26/2012       PATHOLOGY:  ADDITIONAL INFORMATION: PROGNOSTIC INDICATORS - ACIS Results IMMUNOHISTOCHEMICAL AND MORPHOMETRIC ANALYSIS BY THE AUTOMATED CELLULAR IMAGING SYSTEM (ACIS) Estrogen Receptor (Negative, <1%): 0%, NEGATIVE Progesterone Receptor (Negative, <1%): 0%, NEGATIVE Proliferation Marker Ki67 by M IB-1 (Low<20%): 98% All controls stained appropriately Pecola Leisure MD Pathologist, Electronic Signature ( Signed 02/15/2012) CHROMOGENIC IN-SITU HYBRIDIZATION Interpretation: HER2/NEU BY CISH - SHOWS AMPLIFICATION BY CISH ANALYSIS. THE RATIO OF HER2: CEP 17 SIGNALS WAS 2.55. 40 TUMOR CELLS WERE SCORED. OF NOTE, THE TUMOR APPEARS TO SHOW HETEROGENEITY IN REGARDS TO HER2 EXPRESSION. Reference range: Ratio: HER2:CEP17 < 1.8 gene amplification not observed Ratio: HER2:CEP 17 1.8-2.2 - equivocal result Ratio: HER2:CEP17 > 2.2 - gene amplification observed Pecola Leisure MD Pathologist, Electronic Signature ( Signed 02/15/2012) 1 of ADDITIONAL INFORMATION: PROGNOSTIC INDICATORS -  ACIS Results IMMUNOHISTOCHEMICAL AND MORPHOMETRIC ANALYSIS BY THE AUTOMATED CELLULAR IMAGING SYSTEM (ACIS) Estrogen Receptor (Negative, <1%): 0%, NEGATIVE Progesterone Receptor (Negative, <1%): 0%, NEGATIVE COMMENT:  The negative hormone receptor study(ies) in this case have an internal positive control. All controls stained appropriately Abigail Miyamoto MD Pathologist, Electronic Signature ( Signed 02/22/2012)  ASSESSMENT #1 Stage 1 HER-2 positive left breast cancer ER negative PR negative.  Patient is being considered for neoadjuvant chemotherapy consisting of Taxotere carboplatinum and herceptin as she does want breast conservation.  She understands the risks and benefits of this approach and the side effects of her chemotherapy and Herceptin.  #2 She understands she will receive neulasta the day after chemotherapy to keep her white count up.    #3 She does have thrombocytopenia and we will monitor this  #4 Dyspnea on exertion  #5 Nausea and dehydration  #6 anemia  #7Hypokalemia  PLAN:  #1 Ms. Horsley will proceed with her Herceptin today.  She will also receive blood today.    #2 Her dyspnea on exertion is likely related to fatigue from chemotherapy, and anemia.  I have ordered a chest x ray and referred her to see Dr. [name] as well.  Her last echo on 10/22 showed an EF of 60-65%.  #3 Her nausea is controlled by alternating Compazine and Zofran.  She will continue to eat soups and peaches, I have encouraged PO intake with boost and ensure as well.      #4 She likely has cold, and will continue using sudafed and suphedrine PE.  I will see her back next week for her last cycle of chemotherapy.    #5 Ms. Hush hasn't been taking her potassium.  It is 2.6 today.  She will receive PO now, and IV today, She should take daily until I see her next week.  She will return tomorrow for a bmp to evaluate her potassium level and whether any more IV/PO is necessary.     All questions were answered. The patient knows to call the clinic with any problems, questions or concerns. We can certainly see the patient much sooner if necessary.  I spent 40 minutes counseling the patient face to face. The total time spent in the appointment was 30 minutes.  Cherie Ouch Lyn Hollingshead, NP Medical Oncology [facility] [place] Phone: [phone] 04/26/2012, 10:22 AM

## 2012-04-26 NOTE — Progress Notes (Signed)
Order received for IV potassium. Peripheral IV started. Second unit of blood administered peripherally.

## 2012-04-26 NOTE — Telephone Encounter (Signed)
Gave patient appointment for echo and dr.[email or web address]   Sent patient back to the lab for type and cross

## 2012-04-27 ENCOUNTER — Telehealth: Payer: Self-pay | Admitting: *Deleted

## 2012-04-27 ENCOUNTER — Ambulatory Visit (HOSPITAL_COMMUNITY)
Admission: RE | Admit: 2012-04-27 | Discharge: 2012-04-27 | Disposition: A | Discharge status: Home / Self Care | Payer: BC Managed Care – PPO | Source: Ambulatory Visit | Attending: Adult Health | Admitting: Adult Health

## 2012-04-27 ENCOUNTER — Other Ambulatory Visit (HOSPITAL_BASED_OUTPATIENT_CLINIC_OR_DEPARTMENT_OTHER): Payer: BC Managed Care – PPO

## 2012-04-27 DIAGNOSIS — R0602 Shortness of breath: Secondary | ICD-10-CM | POA: Insufficient documentation

## 2012-04-27 DIAGNOSIS — C50419 Malignant neoplasm of upper-outer quadrant of unspecified female breast: Secondary | ICD-10-CM

## 2012-04-27 DIAGNOSIS — C50919 Malignant neoplasm of unspecified site of unspecified female breast: Secondary | ICD-10-CM | POA: Insufficient documentation

## 2012-04-27 DIAGNOSIS — E876 Hypokalemia: Secondary | ICD-10-CM

## 2012-04-27 LAB — TYPE AND SCREEN: Unit division: 0

## 2012-04-27 LAB — BASIC METABOLIC PANEL (CC13)
CO2: 27 mEq/L (ref 22–29)
Calcium: 7.5 mg/dL — ABNORMAL LOW (ref 8.4–10.4)
Potassium: 3.1 mEq/L — ABNORMAL LOW (ref 3.5–5.1)
Sodium: 136 mEq/L (ref 136–145)

## 2012-04-27 NOTE — Telephone Encounter (Signed)
Message copied by Cooper Render on Fri Apr 27, 2012 12:25 PM ------      Message from: Laural Golden      Created: Fri Apr 27, 2012 12:17 PM       Please let ms. Jagielski know to take her potassium daily until her next appt.  Chest x ray normal.              Thanks,       L      ----- Message -----         From: Rad Results In Interface         Sent: 04/27/2012  12:11 PM           To: Augustin Schooling, NP

## 2012-04-27 NOTE — Telephone Encounter (Signed)
Met with pt in lobby, Per NP, chest Xray normal and pt to continue to take K+ pills until next appt.Pt verbalized understanding.

## 2012-05-01 ENCOUNTER — Other Ambulatory Visit: Payer: BC Managed Care – PPO | Admitting: Lab

## 2012-05-03 ENCOUNTER — Encounter: Payer: Self-pay | Admitting: Adult Health

## 2012-05-03 ENCOUNTER — Telehealth: Payer: Self-pay | Admitting: Medical Oncology

## 2012-05-03 ENCOUNTER — Ambulatory Visit: Payer: BC Managed Care – PPO

## 2012-05-03 ENCOUNTER — Other Ambulatory Visit (HOSPITAL_BASED_OUTPATIENT_CLINIC_OR_DEPARTMENT_OTHER): Payer: BC Managed Care – PPO | Admitting: Lab

## 2012-05-03 ENCOUNTER — Ambulatory Visit (HOSPITAL_BASED_OUTPATIENT_CLINIC_OR_DEPARTMENT_OTHER): Payer: BC Managed Care – PPO

## 2012-05-03 ENCOUNTER — Other Ambulatory Visit: Payer: Self-pay

## 2012-05-03 ENCOUNTER — Telehealth: Payer: Self-pay | Admitting: *Deleted

## 2012-05-03 ENCOUNTER — Ambulatory Visit (HOSPITAL_BASED_OUTPATIENT_CLINIC_OR_DEPARTMENT_OTHER): Payer: BC Managed Care – PPO | Admitting: Adult Health

## 2012-05-03 VITALS — BP 145/76 | HR 101 | Temp 98.5°F | Resp 20 | Ht 61.0 in | Wt 187.7 lb

## 2012-05-03 DIAGNOSIS — C50419 Malignant neoplasm of upper-outer quadrant of unspecified female breast: Secondary | ICD-10-CM

## 2012-05-03 DIAGNOSIS — Z5111 Encounter for antineoplastic chemotherapy: Secondary | ICD-10-CM

## 2012-05-03 DIAGNOSIS — Z170 Estrogen receptor positive status [ER+]: Secondary | ICD-10-CM

## 2012-05-03 DIAGNOSIS — Z5112 Encounter for antineoplastic immunotherapy: Secondary | ICD-10-CM

## 2012-05-03 DIAGNOSIS — D696 Thrombocytopenia, unspecified: Secondary | ICD-10-CM

## 2012-05-03 LAB — COMPREHENSIVE METABOLIC PANEL (CC13)
Albumin: 3.8 g/dL (ref 3.5–5.0)
BUN: 13 mg/dL (ref 7.0–26.0)
CO2: 24 mEq/L (ref 22–29)
Calcium: 9.1 mg/dL (ref 8.4–10.4)
Chloride: 97 mEq/L — ABNORMAL LOW (ref 98–107)
Glucose: 157 mg/dl — ABNORMAL HIGH (ref 70–99)
Potassium: 4.6 mEq/L (ref 3.5–5.1)

## 2012-05-03 LAB — CBC WITH DIFFERENTIAL/PLATELET
Basophils Absolute: 0 10*3/uL (ref 0.0–0.1)
HCT: 34.3 % — ABNORMAL LOW (ref 34.8–46.6)
HGB: 11.6 g/dL (ref 11.6–15.9)
MCH: 30.9 pg (ref 25.1–34.0)
MONO#: 0.1 10*3/uL (ref 0.1–0.9)
NEUT%: 79 % — ABNORMAL HIGH (ref 38.4–76.8)
WBC: 9.5 10*3/uL (ref 3.9–10.3)
lymph#: 1.9 10*3/uL (ref 0.9–3.3)

## 2012-05-03 MED ORDER — DIPHENHYDRAMINE HCL 25 MG PO CAPS
50.0000 mg | ORAL_CAPSULE | Freq: Once | ORAL | Status: AC
  Administered 2012-05-03: 50 mg via ORAL

## 2012-05-03 MED ORDER — DOCETAXEL CHEMO INJECTION 160 MG/16ML
75.0000 mg/m2 | Freq: Once | INTRAVENOUS | Status: AC
  Administered 2012-05-03: 150 mg via INTRAVENOUS
  Filled 2012-05-03: qty 15

## 2012-05-03 MED ORDER — SODIUM CHLORIDE 0.9 % IV SOLN
Freq: Once | INTRAVENOUS | Status: AC
  Administered 2012-05-03: 10:00:00 via INTRAVENOUS

## 2012-05-03 MED ORDER — SODIUM CHLORIDE 0.9 % IJ SOLN
10.0000 mL | INTRAMUSCULAR | Status: DC | PRN
Start: 2012-05-03 — End: 2012-05-03
  Administered 2012-05-03: 10 mL
  Filled 2012-05-03: qty 10

## 2012-05-03 MED ORDER — ONDANSETRON HCL 8 MG PO TABS: 8.0000 mg | ORAL_TABLET | Freq: Two times a day (BID) | ORAL | Status: DC

## 2012-05-03 MED ORDER — DEXAMETHASONE 4 MG PO TABS: 8.0000 mg | ORAL_TABLET | Freq: Two times a day (BID) | ORAL | Status: DC

## 2012-05-03 MED ORDER — DEXAMETHASONE SODIUM PHOSPHATE 10 MG/ML IJ SOLN
20.0000 mg | Freq: Once | INTRAMUSCULAR | Status: AC
  Administered 2012-05-03: 20 mg via INTRAVENOUS

## 2012-05-03 MED ORDER — ONDANSETRON 16 MG/50ML IVPB (CHCC)
16.0000 mg | Freq: Once | INTRAVENOUS | Status: AC
  Administered 2012-05-03: 16 mg via INTRAVENOUS

## 2012-05-03 MED ORDER — SODIUM CHLORIDE 0.9 % IV SOLN
580.0000 mg | Freq: Once | INTRAVENOUS | Status: AC
  Administered 2012-05-03: 580 mg via INTRAVENOUS
  Filled 2012-05-03: qty 58

## 2012-05-03 MED ORDER — HEPARIN SOD (PORK) LOCK FLUSH 100 UNIT/ML IV SOLN
500.0000 [IU] | Freq: Once | INTRAVENOUS | Status: AC | PRN
  Administered 2012-05-03: 500 [IU]
  Filled 2012-05-03: qty 5

## 2012-05-03 MED ORDER — ACETAMINOPHEN 325 MG PO TABS
650.0000 mg | ORAL_TABLET | Freq: Once | ORAL | Status: AC
  Administered 2012-05-03: 650 mg via ORAL

## 2012-05-03 MED ORDER — TRASTUZUMAB CHEMO INJECTION 440 MG
2.0000 mg/kg | Freq: Once | INTRAVENOUS | Status: AC
  Administered 2012-05-03: 189 mg via INTRAVENOUS
  Filled 2012-05-03: qty 9

## 2012-05-03 NOTE — Patient Instructions (Signed)
Doing well.  Proceed with chemotherapy.  I will call you about your potassium.  Please call us if you have any questions or concerns.

## 2012-05-03 NOTE — Progress Notes (Signed)
Maria Mckinney [id number] 02/08/1951 62 y.o. 05/03/2012 12:23 PM  CC  [name], MD Dr. [name] [address] [place] [address] Dr. [name] Dr. [name] Dr. 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  PRIOR THERAPY:  #1Patient was originally seen in the multidisciplinary breast clinic for new diagnosis of left breast cancer that was 8mm in the upper outer quadrant of the left breast.  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  There was an additional 4 cm area of normal signal.  Patient did not get an MRI secondary to cochlear implant.  She desires breast conservation.    #2 Patient is s/p port-a-cath placement for neoadjuvant chemotherapy consisting of Taxotere/carboplatinum/Herceptin.  She will begin cycle #1 on 02/29/2012.  She will receive Taxotere and carboplatinum every 21 days with Herceptin giving weekly.  She will receive a total of 4 cycles of Taxotere and Carboplatinum.    CURRENT THERAPY: C4 day 1 TCH with Neulasta support  INTERVAL HISTORY: [name] is feeling much improved today.  Since she had the blood transfusion she is not nearly as weak.  Her CXR was negative.  She does have an echo and Dr. [name] appt scheduled on 1/16.  Her eyes are watering and she has increased clear nasal drainage, but is otherwise w/o any questions/concerns/problems.    Past Medical History: Past Medical History  Diagnosis Date  . HTN (hypertension)   . Fibromyalgia   . GERD (gastroesophageal reflux disease)   . Arthritis   . Anxiety   . Dysrhythmia     irreg hr  . Wears hearing aid     left ear  . 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 PONV (postoperative nausea and vomiting)     Past Surgical History: Past Surgical History  Procedure Date  . Appendectomy     . Combined hysteroscopy diagnostic / d&c   . Cochlear implant   . Portacath placement 02/22/2012    Procedure: INSERTION PORT-A-CATH;  Surgeon: [name], MD;  Location: [facility];  Service: General;  Laterality: Right;    Family History: Family History  Problem Relation Age of Onset  . Cancer Mother     "tumor of face removed"  . Cancer Father     kidney removed  . Cancer Maternal Aunt     stomach & Pancreatic  . Cancer Paternal Uncle     pancreatic    Social History History  Substance Use Topics  . Smoking status: Never Smoker   . Smokeless tobacco: Not on file  . Alcohol Use: 0.0 oz/week    0-1 Glasses of wine per week    Allergies: Allergies  Allergen Reactions  . Acyclovir And Related   . Ciprofloxacin Hcl Rash    Current Medications: Current Outpatient Prescriptions  Medication Sig Dispense Refill  . Alum & Mag Hydroxide-Simeth (MAGIC MOUTHWASH W/LIDOCAINE) SOLN Take 5 mLs by mouth 4 (four) times daily.  200 mL  6  . bisoprolol-hydrochlorothiazide (ZIAC) 5-6.25 MG per tablet Take 1 tablet by mouth daily.      . chlorpheniramine (CHLOR-TRIMETON) 4 MG tablet Take 4 mg by mouth at bedtime as needed.      . cyclobenzaprine (FLEXERIL) 10 MG tablet Take 10 mg by mouth 3 (three) times daily as needed.      Marland Kitchen  dexamethasone (DECADRON) 4 MG tablet Take 2 tablets (8 mg total) by mouth 2 (two) times daily with a meal. Take two times a day the day before Taxotere. Then take two times a day starting the day after chemo for 3 days.  30 tablet  1  . DULoxetine (CYMBALTA) 60 MG capsule Take 60 mg by mouth daily.      Marland Kitchen HYDROcodone-acetaminophen (VICODIN) 5-500 MG per tablet Take 1 tablet by mouth every 6 (six) hours as needed.  60 tablet  0  . levothyroxine (SYNTHROID, LEVOTHROID) 100 MCG tablet Take 100 mcg by mouth daily.      Marland Kitchen lidocaine-prilocaine (EMLA) cream Apply topically as needed.  30 g  4  . LORazepam (ATIVAN) 0.5 MG tablet Take 1 tablet (0.5  mg total) by mouth every 6 (six) hours as needed (Nausea or vomiting).  30 tablet  0  . meloxicam (MOBIC) 15 MG tablet Take 15 mg by mouth daily.      . mometasone (NASONEX) 50 MCG/ACT nasal spray Place 2 sprays into the nose daily.      . ondansetron (ZOFRAN) 8 MG tablet Take 1 tablet (8 mg total) by mouth 2 (two) times daily. Take two times a day starting the day after chemo for 3 days. Then take two times a day as needed for nausea or vomiting.  30 tablet  1  . potassium chloride SA (K-DUR,KLOR-CON) 20 MEQ tablet 1 po daily x 7 days  7 tablet  0  . potassium chloride SA (K-DUR,KLOR-CON) 20 MEQ tablet Take 2 tablets ( ) daily x 7 days  14 tablet  0  . prochlorperazine (COMPAZINE) 10 MG tablet Take 1 tablet (10 mg total) by mouth every 6 (six) hours as needed (Nausea or vomiting).  30 tablet  1  . prochlorperazine (COMPAZINE) 25 MG suppository Place 1 suppository (25 mg total) rectally every 12 (twelve) hours as needed for nausea.  12 suppository  3  . promethazine (PHENERGAN) 25 MG tablet Take 1 tablet (25 mg total) by mouth every 6 (six) hours as needed for nausea.  60 tablet  0  . pseudoephedrine (SUDAFED) 30 MG tablet Take 30 mg by mouth every 4 (four) hours as needed.      . ranitidine (ZANTAC) 300 MG tablet Take 300 mg by mouth at bedtime.      Marland Kitchen UNABLE TO FIND Cranial Prothesis  1 each  0  . phenylephrine (SUDAFED PE) 10 MG TABS Take 10 mg by mouth at bedtime as needed.       No current facility-administered medications for this visit.   Facility-Administered Medications Ordered in Other Visits  Medication Dose Route Frequency Provider Last Rate Last Dose  . CARBOplatin (PARAPLATIN) 580 mg in sodium chloride 0.9 % 250 mL chemo infusion  580 mg Intravenous Once Victorino December, MD 616 mL/hr at 05/03/12 1201 580 mg at 05/03/12 1201  . heparin lock flush 100 unit/mL  500 Units Intracatheter Once PRN Victorino December, MD      . sodium chloride 0.9 % injection 10 mL  10 mL Intracatheter PRN  Victorino December, MD      . trastuzumab (HERCEPTIN) 189 mg in sodium chloride 0.9 % 250 mL chemo infusion  2 mg/kg (Treatment Plan Actual) Intravenous Once Victorino December, MD        Health Maintenance:  ColonoscopyShe has never had a colonoscopy Bone DensityBone density about 10-12 years ago Last PAP smearLast Pap smear was in 2012  REVIEW OF SYSTEMS: General: fatigue (-), night sweats (-), fever (-), pain (-) Lymph: palpable nodes (-) HEENT: vision changes (-), mucositis (-), gum bleeding (-), epistaxis (-) Cardiovascular: chest pain (-), palpitations (-) Pulmonary: shortness of breath (-), dyspnea on exertion (-), cough (-), hemoptysis (-) GI:  Early satiety (-), melena (-), dysphagia (-), nausea/vomiting (-), diarrhea (-) GU: dysuria (-), hematuria (-), incontinence (-) Musculoskeletal: joint swelling (-), joint pain (-), back pain (-) Neuro: weakness (-), numbness (-), headache (-), confusion (-) Skin: Rash (-), lesions (-), dryness (-) Psych: depression (-), suicidal/homicidal ideation (-), feeling of hopelessness (-)   PHYSICAL EXAMINATION: Blood pressure 145/76, pulse 101, temperature 98.5 F (36.9 C), resp. rate 20, height 5\' 1"  (1.549 m), weight 187 lb 11.2 oz (85.14 kg). General: Patient is a well appearing female in no acute distress HEENT: PERRLA, sclerae anicteric no conjunctival pallor, MMM, Neck: supple, no palpable adenopathy Lungs: clear to auscultation bilaterally, no wheezes, rhonchi, or rales Cardiovascular: regular rate rhythm, S1, S2, no murmurs, rubs or gallops, HR recheck 96 apical Abdomen: Soft, non-tender, non-distended, normoactive bowel sounds, no HSM Extremities: warm and well perfused, no clubbing, cyanosis, or edema Skin: No rashes or lesions Neuro: Non-focal ECOG PERFORMANCE STATUS: 1 - Symptomatic but completely ambulatory Breasts: unable to palpate left breast mass, no nodularity, right breast: no masses or nodularity  STUDIES/RESULTS: No  results found.   LABS:    Chemistry      Component Value Date/Time   NA 134* 05/03/2012 0856   K 4.6 05/03/2012 0856   CL 97* 05/03/2012 0856   CO2 24 05/03/2012 0856   BUN 13.0 05/03/2012 0856   CREATININE 1.1 05/03/2012 0856      Component Value Date/Time   CALCIUM 9.1 05/03/2012 0856   ALKPHOS 104 05/03/2012 0856   AST 33 05/03/2012 0856   ALT 29 05/03/2012 0856   BILITOT 0.94 05/03/2012 0856      Lab Results  Component Value Date   WBC 9.5 05/03/2012   HGB 11.6 05/03/2012   HCT 34.3* 05/03/2012   MCV 91.5 05/03/2012   PLT 165 05/03/2012       PATHOLOGY:  ADDITIONAL INFORMATION: PROGNOSTIC INDICATORS - ACIS Results IMMUNOHISTOCHEMICAL AND MORPHOMETRIC ANALYSIS BY THE AUTOMATED CELLULAR IMAGING SYSTEM (ACIS) Estrogen Receptor (Negative, <1%): 0%, NEGATIVE Progesterone Receptor (Negative, <1%): 0%, NEGATIVE Proliferation Marker Ki67 by M IB-1 (Low<20%): 98% All controls stained appropriately Pecola Leisure MD Pathologist, Electronic Signature ( Signed 02/15/2012) CHROMOGENIC IN-SITU HYBRIDIZATION Interpretation: HER2/NEU BY CISH - SHOWS AMPLIFICATION BY CISH ANALYSIS. THE RATIO OF HER2: CEP 17 SIGNALS WAS 2.55. 40 TUMOR CELLS WERE SCORED. OF NOTE, THE TUMOR APPEARS TO SHOW HETEROGENEITY IN REGARDS TO HER2 EXPRESSION. Reference range: Ratio: HER2:CEP17 < 1.8 gene amplification not observed Ratio: HER2:CEP 17 1.8-2.2 - equivocal result Ratio: HER2:CEP17 > 2.2 - gene amplification observed Pecola Leisure MD Pathologist, Electronic Signature ( Signed 02/15/2012) 1 of ADDITIONAL INFORMATION: PROGNOSTIC INDICATORS - ACIS Results IMMUNOHISTOCHEMICAL AND MORPHOMETRIC ANALYSIS BY THE AUTOMATED CELLULAR IMAGING SYSTEM (ACIS) Estrogen Receptor (Negative, <1%): 0%, NEGATIVE Progesterone Receptor (Negative, <1%): 0%, NEGATIVE COMMENT: The negative hormone receptor study(ies) in this case have an internal positive control. All controls stained appropriately Abigail Miyamoto MD Pathologist,  Electronic Signature ( Signed 02/22/2012)  ASSESSMENT #1 Stage 1 HER-2 positive left breast cancer ER negative PR negative.  Patient is being considered for neoadjuvant chemotherapy consisting of Taxotere carboplatinum and herceptin as she does want breast conservation.  She understands the risks and benefits of this approach  and the side effects of her chemotherapy and Herceptin.  #2 She understands she will receive neulasta the day after chemotherapy to keep her white count up.    #3 She does have thrombocytopenia and we will monitor this  PLAN:  #1 Ms. Tsui will proceed with cycle 4 of TCH today.  Her counts have recovered.  She received blood last week and responded very well with it.  I ordered a mammogram/breast ultrasound for re-evaluation of her breast cancer since she cannot have an MRI due to her cochlear implant.    #2 I will see her back next week for her weekly Herceptin.    All questions were answered. The patient knows to call the clinic with any problems, questions or concerns. We can certainly see the patient much sooner if necessary.  I spent 25 minutes counseling the patient face to face. The total time spent in the appointment was 30 minutes.  Cherie Ouch Lyn Hollingshead, NP Medical Oncology [facility] Phone: [phone] 05/03/2012, 12:23 PM

## 2012-05-03 NOTE — Telephone Encounter (Signed)
Made patient appointment for [facility] mammogram and ultrasound on 05-08-2012 1:15pm

## 2012-05-03 NOTE — Patient Instructions (Addendum)
[facility] Discharge Instructions for Patients Receiving Chemotherapy  Today you received the following chemotherapy agents Taxotere, Carboplatin and Herceptin.  To help prevent nausea and vomiting after your treatment, we encourage you to take your nausea medication as ordered per MD.    If you develop nausea and vomiting that is not controlled by your nausea medication, call the clinic. If it is after clinic hours your family physician or the after hours number for the clinic or go to the Emergency Department.   BELOW ARE SYMPTOMS THAT SHOULD BE REPORTED IMMEDIATELY:  *FEVER GREATER THAN 100.5 F  *CHILLS WITH OR WITHOUT FEVER  NAUSEA AND VOMITING THAT IS NOT CONTROLLED WITH YOUR NAUSEA MEDICATION  *UNUSUAL SHORTNESS OF BREATH  *UNUSUAL BRUISING OR BLEEDING  TENDERNESS IN MOUTH AND THROAT WITH OR WITHOUT PRESENCE OF ULCERS  *URINARY PROBLEMS  *BOWEL PROBLEMS  UNUSUAL RASH Items with * indicate a potential emergency and should be followed up as soon as possible.   Please let the nurse know about any problems that you may have experienced. Feel free to call the clinic you have any questions or concerns. The clinic phone number is [phone].   I have been informed and understand all the instructions given to me. I know to contact the clinic, my physician, or go to the Emergency Department if any problems should occur. I do not have any questions at this time, but understand that I may call the clinic during office hours   should I have any questions or need assistance in obtaining follow up care.

## 2012-05-03 NOTE — Telephone Encounter (Signed)
Per NP, called patient, patient not home but spoke with spouse and informed for patient to stop her potassium completely. We will recheck next week, but right now it is on the high side of normal and she doesn't need it. Spouse verbalized understanding and will inform patient. No further questions at this time.

## 2012-05-04 ENCOUNTER — Encounter: Payer: Self-pay | Admitting: *Deleted

## 2012-05-04 ENCOUNTER — Other Ambulatory Visit: Payer: Self-pay | Admitting: *Deleted

## 2012-05-04 ENCOUNTER — Ambulatory Visit (HOSPITAL_BASED_OUTPATIENT_CLINIC_OR_DEPARTMENT_OTHER): Payer: BC Managed Care – PPO

## 2012-05-04 ENCOUNTER — Telehealth: Payer: Self-pay | Admitting: Medical Oncology

## 2012-05-04 VITALS — BP 139/72 | HR 87 | Temp 97.2°F | Resp 20

## 2012-05-04 DIAGNOSIS — C50919 Malignant neoplasm of unspecified site of unspecified female breast: Secondary | ICD-10-CM

## 2012-05-04 DIAGNOSIS — C50419 Malignant neoplasm of upper-outer quadrant of unspecified female breast: Secondary | ICD-10-CM

## 2012-05-04 DIAGNOSIS — Z5189 Encounter for other specified aftercare: Secondary | ICD-10-CM

## 2012-05-04 MED ORDER — ONDANSETRON HCL 8 MG PO TABS: 8.0000 mg | ORAL_TABLET | Freq: Two times a day (BID) | ORAL | Status: DC

## 2012-05-04 MED ORDER — DEXAMETHASONE 4 MG PO TABS: 8.0000 mg | ORAL_TABLET | ORAL | Status: DC

## 2012-05-04 MED ORDER — PEGFILGRASTIM INJECTION 6 MG/0.6ML
6.0000 mg | Freq: Once | SUBCUTANEOUS | Status: AC
  Administered 2012-05-04: 6 mg via SUBCUTANEOUS
  Filled 2012-05-04: qty 0.6

## 2012-05-04 NOTE — Telephone Encounter (Signed)
Ok refill zofran

## 2012-05-04 NOTE — Progress Notes (Signed)
[facility] faxed form that Zofran 8 mg requires Prior Authorization from insurance company [phone] for refill to process. Forwarded to managed care.

## 2012-05-04 NOTE — Telephone Encounter (Signed)
Per MD, Patient informed that her prescription refill for Zofran has been sent to her listed pharmacy. Patient expressed thanks. No further questions at this time.

## 2012-05-04 NOTE — Telephone Encounter (Signed)
Pt LVMOM requesting refill of zofran, states she only has 2 pills left and she had treatment yesterday. Next sched appt lab/NP/treatment 05/09/12. Will review request with MD.

## 2012-05-08 ENCOUNTER — Telehealth: Payer: Self-pay | Admitting: Oncology

## 2012-05-08 ENCOUNTER — Other Ambulatory Visit: Payer: BC Managed Care – PPO | Admitting: Lab

## 2012-05-08 NOTE — Telephone Encounter (Signed)
Per email from [place] moved lb/fu for 1/8 to 9:45am and 10:15am. lmonvm for pt on both home and cell.

## 2012-05-09 ENCOUNTER — Other Ambulatory Visit (HOSPITAL_BASED_OUTPATIENT_CLINIC_OR_DEPARTMENT_OTHER): Payer: BC Managed Care – PPO | Admitting: Lab

## 2012-05-09 ENCOUNTER — Ambulatory Visit (HOSPITAL_BASED_OUTPATIENT_CLINIC_OR_DEPARTMENT_OTHER): Payer: BC Managed Care – PPO

## 2012-05-09 ENCOUNTER — Encounter: Payer: Self-pay | Admitting: Adult Health

## 2012-05-09 ENCOUNTER — Telehealth: Payer: Self-pay | Admitting: Oncology

## 2012-05-09 ENCOUNTER — Other Ambulatory Visit: Payer: Self-pay | Admitting: Oncology

## 2012-05-09 ENCOUNTER — Ambulatory Visit (HOSPITAL_BASED_OUTPATIENT_CLINIC_OR_DEPARTMENT_OTHER): Payer: BC Managed Care – PPO | Admitting: Adult Health

## 2012-05-09 VITALS — BP 129/53 | HR 91 | Temp 98.9°F | Resp 17

## 2012-05-09 VITALS — BP 118/76 | HR 111 | Temp 98.3°F | Resp 20 | Ht 61.0 in | Wt 177.5 lb

## 2012-05-09 DIAGNOSIS — C50419 Malignant neoplasm of upper-outer quadrant of unspecified female breast: Secondary | ICD-10-CM

## 2012-05-09 DIAGNOSIS — R5383 Other fatigue: Secondary | ICD-10-CM

## 2012-05-09 DIAGNOSIS — Z5112 Encounter for antineoplastic immunotherapy: Secondary | ICD-10-CM

## 2012-05-09 DIAGNOSIS — D709 Neutropenia, unspecified: Secondary | ICD-10-CM

## 2012-05-09 DIAGNOSIS — C50919 Malignant neoplasm of unspecified site of unspecified female breast: Secondary | ICD-10-CM

## 2012-05-09 DIAGNOSIS — R5381 Other malaise: Secondary | ICD-10-CM

## 2012-05-09 DIAGNOSIS — IMO0001 Reserved for inherently not codable concepts without codable children: Secondary | ICD-10-CM

## 2012-05-09 DIAGNOSIS — E860 Dehydration: Secondary | ICD-10-CM

## 2012-05-09 DIAGNOSIS — R110 Nausea: Secondary | ICD-10-CM

## 2012-05-09 LAB — CBC WITH DIFFERENTIAL/PLATELET
Basophils Absolute: 0 10*3/uL (ref 0.0–0.1)
EOS%: 2.1 % (ref 0.0–7.0)
HGB: 11.8 g/dL (ref 11.6–15.9)
MCH: 30.7 pg (ref 25.1–34.0)
NEUT#: 0.3 10*3/uL — CL (ref 1.5–6.5)
RDW: 17.7 % — ABNORMAL HIGH (ref 11.2–14.5)
lymph#: 0.6 10*3/uL — ABNORMAL LOW (ref 0.9–3.3)

## 2012-05-09 LAB — COMPREHENSIVE METABOLIC PANEL (CC13)
ALT: 137 U/L — ABNORMAL HIGH (ref 0–55)
AST: 78 U/L — ABNORMAL HIGH (ref 5–34)
Albumin: 3.9 g/dL (ref 3.5–5.0)
Alkaline Phosphatase: 85 U/L (ref 40–150)
Glucose: 142 mg/dl — ABNORMAL HIGH (ref 70–99)
Potassium: 3 mEq/L — ABNORMAL LOW (ref 3.5–5.1)
Sodium: 129 mEq/L — ABNORMAL LOW (ref 136–145)
Total Protein: 6.2 g/dL — ABNORMAL LOW (ref 6.4–8.3)

## 2012-05-09 MED ORDER — ESOMEPRAZOLE MAGNESIUM 40 MG PO CPDR: 40.0000 mg | DELAYED_RELEASE_CAPSULE | Freq: Every day | ORAL | Status: DC

## 2012-05-09 MED ORDER — SODIUM CHLORIDE 0.9 % IV SOLN
Freq: Once | INTRAVENOUS | Status: AC
  Administered 2012-05-09: 11:00:00 via INTRAVENOUS

## 2012-05-09 MED ORDER — SODIUM CHLORIDE 0.9 % IJ SOLN
10.0000 mL | INTRAMUSCULAR | Status: DC | PRN
  Administered 2012-05-09: 10 mL
  Filled 2012-05-09: qty 10

## 2012-05-09 MED ORDER — HEPARIN SOD (PORK) LOCK FLUSH 100 UNIT/ML IV SOLN
500.0000 [IU] | Freq: Once | INTRAVENOUS | Status: AC | PRN
  Administered 2012-05-09: 500 [IU]
  Filled 2012-05-09: qty 5

## 2012-05-09 MED ORDER — ACETAMINOPHEN 325 MG PO TABS
650.0000 mg | ORAL_TABLET | Freq: Once | ORAL | Status: AC
  Administered 2012-05-09: 650 mg via ORAL

## 2012-05-09 MED ORDER — DIPHENHYDRAMINE HCL 25 MG PO CAPS
50.0000 mg | ORAL_CAPSULE | Freq: Once | ORAL | Status: AC
  Administered 2012-05-09: 50 mg via ORAL

## 2012-05-09 MED ORDER — TRASTUZUMAB CHEMO INJECTION 440 MG
2.0000 mg/kg | Freq: Once | INTRAVENOUS | Status: AC
  Administered 2012-05-09: 189 mg via INTRAVENOUS
  Filled 2012-05-09: qty 9

## 2012-05-09 MED ORDER — ONDANSETRON HCL 8 MG PO TABS: 8.0000 mg | ORAL_TABLET | Freq: Two times a day (BID) | ORAL | Status: DC

## 2012-05-09 MED ORDER — ONDANSETRON 16 MG/50ML IVPB (CHCC)
16.0000 mg | Freq: Once | INTRAVENOUS | Status: AC
  Administered 2012-05-09: 16 mg via INTRAVENOUS

## 2012-05-09 NOTE — Patient Instructions (Signed)
[facility] Discharge Instructions for Patients Receiving Chemotherapy  Today you received the following chemotherapy agent Herceptin.  To help prevent nausea and vomiting after your treatment, we encourage you to take your nausea medication as often as prescribed for by Dr Welton Flakes.   If you develop nausea and vomiting that is not controlled by your nausea medication, call the clinic. If it is after clinic hours your family physician or the after hours number for the clinic or go to the Emergency Department.   BELOW ARE SYMPTOMS THAT SHOULD BE REPORTED IMMEDIATELY:  *FEVER GREATER THAN 100.5 F  *CHILLS WITH OR WITHOUT FEVER  NAUSEA AND VOMITING THAT IS NOT CONTROLLED WITH YOUR NAUSEA MEDICATION  *UNUSUAL SHORTNESS OF BREATH  *UNUSUAL BRUISING OR BLEEDING  TENDERNESS IN MOUTH AND THROAT WITH OR WITHOUT PRESENCE OF ULCERS  *URINARY PROBLEMS  *BOWEL PROBLEMS  UNUSUAL RASH Items with * indicate a potential emergency and should be followed up as soon as possible.  One of the nurses will contact you 24 hours after your treatment. Please let the nurse know about any problems that you may have experienced. Feel free to call the clinic you have any questions or concerns. The clinic phone number is [phone].   I have been informed and understand all the instructions given to me. I know to contact the clinic, my physician, or go to the Emergency Department if any problems should occur. I do not have any questions at this time, but understand that I may call the clinic during office hours   should I have any questions or need assistance in obtaining follow up care.    __________________________________________  _____________  __________ Signature of Patient or Authorized Representative            Date                   Time    __________________________________________ Nurse's Signature  Dehydration, Adult Dehydration is when you lose more fluids from the body than  you take in. Vital organs like the kidneys, brain, and heart cannot function without a proper amount of fluids and salt. Any loss of fluids from the body can cause dehydration.  CAUSES   Vomiting.  Diarrhea.  Excessive sweating.  Excessive urine output.  Fever. SYMPTOMS  Mild dehydration  Thirst.  Dry lips.  Slightly dry mouth. Moderate dehydration  Very dry mouth.  Sunken eyes.  Skin does not bounce back quickly when lightly pinched and released.  Dark urine and decreased urine production.  Decreased tear production.  Headache. Severe dehydration  Very dry mouth.  Extreme thirst.  Rapid, weak pulse (more than 100 beats per minute at rest).  Cold hands and feet.  Not able to sweat in spite of heat and temperature.  Rapid breathing.  Blue lips.  Confusion and lethargy.  Difficulty being awakened.  Minimal urine production.  No tears. DIAGNOSIS  Your caregiver will diagnose dehydration based on your symptoms and your exam. Blood and urine tests will help confirm the diagnosis. The diagnostic evaluation should also identify the cause of dehydration. TREATMENT  Treatment of mild or moderate dehydration can often be done at home by increasing the amount of fluids that you drink. It is best to drink small amounts of fluid more often. Drinking too much at one time can make vomiting worse. Refer to the home care instructions below. Severe dehydration needs to be treated at the hospital where you will probably be given intravenous (IV)  fluids that contain water and electrolytes. HOME CARE INSTRUCTIONS   Ask your caregiver about specific rehydration instructions.  Drink enough fluids to keep your urine clear or pale yellow.  Drink small amounts frequently if you have nausea and vomiting.  Eat as you normally do.  Avoid:  Foods or drinks high in sugar.  Carbonated drinks.  Juice.  Extremely hot or cold fluids.  Drinks with caffeine.  Fatty,  greasy foods.  Alcohol.  Tobacco.  Overeating.  Gelatin desserts.  Wash your hands well to avoid spreading bacteria and viruses.  Only take over-the-counter or prescription medicines for pain, discomfort, or fever as directed by your caregiver.  Ask your caregiver if you should continue all prescribed and over-the-counter medicines.  Keep all follow-up appointments with your caregiver. SEEK MEDICAL CARE IF:  You have abdominal pain and it increases or stays in one area (localizes).  You have a rash, stiff neck, or severe headache.  You are irritable, sleepy, or difficult to awaken.  You are weak, dizzy, or extremely thirsty. SEEK IMMEDIATE MEDICAL CARE IF:   You are unable to keep fluids down or you get worse despite treatment.  You have frequent episodes of vomiting or diarrhea.  You have blood or green matter (bile) in your vomit.  You have blood in your stool or your stool looks black and tarry.  You have not urinated in 6 to 8 hours, or you have only urinated a small amount of very dark urine.  You have a fever.  You faint. MAKE SURE YOU:   Understand these instructions.  Will watch your condition.  Will get help right away if you are not doing well or get worse. Document Released: 04/18/2005 Document Revised: 07/11/2011 Document Reviewed: 12/06/2010 [facility] Patient Information 2013 [place], [place].

## 2012-05-09 NOTE — Telephone Encounter (Signed)
gv pt appt schedule for January - appt for mammo/us tomorrow @ [facility] and appt w/Dr. Dwain Sarna for 2/6. Dr. Doreen Salvage next available appt was 2/6.

## 2012-05-09 NOTE — Patient Instructions (Addendum)
  Patient Neutropenia Instruction Sheet  Diagnosis: Breast Cancer      Treating Physician: Drue Second, MD  Treatment: 1. Type of chemotherapy: TCH 2. Date of last treatment: 05/03/12  Last Blood Counts: Lab Results  Component Value Date   WBC 1.0* 05/09/2012   HGB 11.8 05/09/2012   HCT 33.1* 05/09/2012   MCV 86.2 05/09/2012   PLT 127* 05/09/2012  ANC 300      Prophylactic Antibiotics: NONE.  Observation only. Instructions: 1. Monitor temperature and call if fever  greater than 100.5, chills, shaking chills (rigors) 2. Call Physician on-call at [phone] 3. Give him/her symptoms and list of medications that you are taking and your last blood count.   Proceed with Herceptin and IV fluids.  I have sent in new prescription for Zofran and Nexium.  Please call us if you have any questions or concerns.    We will see you back next week for labs and herceptin.  Please call ASAP to get mammogram and ultrasound re-scheduled.

## 2012-05-09 NOTE — Progress Notes (Signed)
Maria Mckinney [id number] 08-Oct-1950 62 y.o. 05/09/2012 1:32 PM  CC  Mckinney,[name], MD Dr. Sharman Crate Mckinney [address] [place] [address] Dr. [name] Dr. [name] Dr. 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  PRIOR THERAPY:  #1Patient was originally seen in the multidisciplinary breast clinic for new diagnosis of left breast cancer that was 8mm in the upper outer quadrant of the left breast.  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  There was an additional 4 cm area of normal signal.  Patient did not get an MRI secondary to cochlear implant.  She desires breast conservation.    #2 Patient is s/p port-a-cath placement for neoadjuvant chemotherapy consisting of Taxotere/carboplatinum/Herceptin.  She will begin cycle #1 on 02/29/2012.  She will receive Taxotere and carboplatinum every 21 days with Herceptin giving weekly.  She will receive a total of 4 cycles of Taxotere and Carboplatinum.    CURRENT THERAPY: C4 day 8 TCH with Neulasta support  INTERVAL HISTORY: Maria Mckinney is here for f/u after her fourth cycle of TCH.  She is here for eval. For Her weekly herceptin.  She's had a rough past week with a "few days" of diarrhea that was stopped with Imodium, and then had subsequent constipation requiring a laxative.  Things have improved and she had her last formed BM yesterday.  She feels weak.  She also states that she never received a call about her mammogram/ultrasound appointment, and she therefore missed it.  She is c/o indigestion after she takes her meds and with meals.  She would like Nexium for this, as she has had this happen in the past and it worked for her.  She's been taking Compazine and Zofran for her nausea.  She continues to feel weak, but is otherwise without any  questions/concerns.    Past Medical History: Past Medical History  Diagnosis Date  . HTN (hypertension)   . Fibromyalgia   . GERD (gastroesophageal reflux disease)   . Arthritis   . Anxiety   . Dysrhythmia     irreg hr  . Wears hearing aid     left ear  . 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 PONV (postoperative nausea and vomiting)     Past Surgical History: Past Surgical History  Procedure Date  . Appendectomy   . Combined hysteroscopy diagnostic / d&c   . Cochlear implant   . Portacath placement 02/22/2012    Procedure: INSERTION PORT-A-CATH;  Surgeon: [name], MD;  Location: [facility];  Service: General;  Laterality: Right;    Family History: Family History  Problem Relation Age of Onset  . Cancer Mother     "tumor of face removed"  . Cancer Father     kidney removed  . Cancer Maternal Aunt     stomach & Pancreatic  . Cancer Paternal Uncle     pancreatic    Social History History  Substance Use Topics  . Smoking status: Never Smoker   . Smokeless tobacco: Not on file  . Alcohol Use: 0.0 oz/week    0-1 Glasses of wine per week    Allergies: Allergies  Allergen Reactions  . Acyclovir And Related   . Ciprofloxacin Hcl Rash    Current Medications: Current Outpatient Prescriptions  Medication Sig Dispense Refill  . Alum &  Mag Hydroxide-Simeth (MAGIC MOUTHWASH W/LIDOCAINE) SOLN Take 5 mLs by mouth 4 (four) times daily.  200 mL  6  . bisoprolol-hydrochlorothiazide (ZIAC) 5-6.25 MG per tablet Take 1 tablet by mouth daily.      . chlorpheniramine (CHLOR-TRIMETON) 4 MG tablet Take 4 mg by mouth at bedtime as needed.      . cyclobenzaprine (FLEXERIL) 10 MG tablet Take 10 mg by mouth 3 (three) times daily as needed.      Marland Kitchen dexamethasone (DECADRON) 4 MG tablet Take 2 tablets (8 mg total) by mouth as directed. Take BID the day before Taxotere. Then BID starting the day after chemo x 3 days.  30 tablet  1  . DULoxetine (CYMBALTA) 60 MG  capsule Take 60 mg by mouth daily.      Marland Kitchen HYDROcodone-acetaminophen (VICODIN) 5-500 MG per tablet Take 1 tablet by mouth every 6 (six) hours as needed.  60 tablet  0  . levothyroxine (SYNTHROID, LEVOTHROID) 100 MCG tablet Take 100 mcg by mouth daily.      Marland Kitchen lidocaine-prilocaine (EMLA) cream Apply topically as needed.  30 g  4  . LORazepam (ATIVAN) 0.5 MG tablet Take 1 tablet (0.5 mg total) by mouth every 6 (six) hours as needed (Nausea or vomiting).  30 tablet  0  . meloxicam (MOBIC) 15 MG tablet Take 15 mg by mouth daily.      . mometasone (NASONEX) 50 MCG/ACT nasal spray Place 2 sprays into the nose daily.      . ondansetron (ZOFRAN) 8 MG tablet Take 1 tablet (8 mg total) by mouth 2 (two) times daily. Take BID starting the day after chemo x 3 days. Then BID PRN nausea or vomiting.  30 tablet  1  . phenylephrine (SUDAFED PE) 10 MG TABS Take 10 mg by mouth at bedtime as needed.      . potassium chloride SA (K-DUR,KLOR-CON) 20 MEQ tablet 1 po daily x 7 days  7 tablet  0  . prochlorperazine (COMPAZINE) 10 MG tablet Take 1 tablet (10 mg total) by mouth every 6 (six) hours as needed (Nausea or vomiting).  30 tablet  1  . prochlorperazine (COMPAZINE) 25 MG suppository Place 1 suppository (25 mg total) rectally every 12 (twelve) hours as needed for nausea.  12 suppository  3  . promethazine (PHENERGAN) 25 MG tablet Take 1 tablet (25 mg total) by mouth every 6 (six) hours as needed for nausea.  60 tablet  0  . pseudoephedrine (SUDAFED) 30 MG tablet Take 30 mg by mouth every 4 (four) hours as needed.      . ranitidine (ZANTAC) 300 MG tablet Take 300 mg by mouth at bedtime.      Marland Kitchen UNABLE TO FIND Cranial Prothesis  1 each  0  . esomeprazole (NEXIUM) 40 MG capsule Take 1 capsule (40 mg total) by mouth daily before breakfast.  30 capsule  6  . potassium chloride SA (K-DUR,KLOR-CON) 20 MEQ tablet Take 2 tablets ( ) daily x 7 days  14 tablet  0   No current facility-administered medications for this visit.     Facility-Administered Medications Ordered in Other Visits  Medication Dose Route Frequency Provider Last Rate Last Dose  . sodium chloride 0.9 % injection 10 mL  10 mL Intracatheter PRN Augustin Schooling, NP   10 mL at 05/09/12 1319    Health Maintenance:  ColonoscopyShe has never had a colonoscopy Bone DensityBone density about 10-12 years ago Last PAP smearLast Pap smear was in 2012  REVIEW OF SYSTEMS: General: fatigue (-), night sweats (-), fever (-), pain (-) Lymph: palpable nodes (-) HEENT: vision changes (-), mucositis (-), gum bleeding (-), epistaxis (-) Cardiovascular: chest pain (-), palpitations (-) Pulmonary: shortness of breath (-), dyspnea on exertion (-), cough (-), hemoptysis (-) GI:  Early satiety (-), melena (-), dysphagia (-), nausea/vomiting (+), diarrhea (-) GU: dysuria (-), hematuria (-), incontinence (-) Musculoskeletal: joint swelling (-), joint pain (-), back pain (-) Neuro: weakness (-), numbness (-), headache (-), confusion (-) Skin: Rash (-), lesions (-), dryness (-) Psych: depression (-), suicidal/homicidal ideation (-), feeling of hopelessness (-)   PHYSICAL EXAMINATION: Blood pressure 118/76, pulse 111, temperature 98.3 F (36.8 C), resp. rate 20, height 5\' 1"  (1.549 m), weight 177 lb 8 oz (80.513 kg). General: Patient is a well appearing female in no acute distress HEENT: PERRLA, sclerae anicteric no conjunctival pallor, MMM, Neck: supple, no palpable adenopathy Lungs: clear to auscultation bilaterally, no wheezes, rhonchi, or rales Cardiovascular: regular rate rhythm, S1, S2, no murmurs, rubs or gallops, HR recheck 96 apical Abdomen: Soft, non-tender, non-distended, normoactive bowel sounds, no HSM Extremities: warm and well perfused, no clubbing, cyanosis, or edema Skin: No rashes or lesions Neuro: Non-focal ECOG PERFORMANCE STATUS: 1 - Symptomatic but completely ambulatory Breasts: unable to palpate left breast mass, no nodularity, right  breast: no masses or nodularity  STUDIES/RESULTS: No results found.   LABS:    Chemistry      Component Value Date/Time   NA 134* 05/03/2012 0856   K 4.6 05/03/2012 0856   CL 97* 05/03/2012 0856   CO2 24 05/03/2012 0856   BUN 13.0 05/03/2012 0856   CREATININE 1.1 05/03/2012 0856      Component Value Date/Time   CALCIUM 9.1 05/03/2012 0856   ALKPHOS 104 05/03/2012 0856   AST 33 05/03/2012 0856   ALT 29 05/03/2012 0856   BILITOT 0.94 05/03/2012 0856      Lab Results  Component Value Date   WBC 1.0* 05/09/2012   HGB 11.8 05/09/2012   HCT 33.1* 05/09/2012   MCV 86.2 05/09/2012   PLT 127* 05/09/2012      PATHOLOGY:  ADDITIONAL INFORMATION: PROGNOSTIC INDICATORS - ACIS Results IMMUNOHISTOCHEMICAL AND MORPHOMETRIC ANALYSIS BY THE AUTOMATED CELLULAR IMAGING SYSTEM (ACIS) Estrogen Receptor (Negative, <1%): 0%, NEGATIVE Progesterone Receptor (Negative, <1%): 0%, NEGATIVE Proliferation Marker Ki67 by M IB-1 (Low<20%): 98% All controls stained appropriately Pecola Leisure MD Pathologist, Electronic Signature ( Signed 02/15/2012) CHROMOGENIC IN-SITU HYBRIDIZATION Interpretation: HER2/NEU BY CISH - SHOWS AMPLIFICATION BY CISH ANALYSIS. THE RATIO OF HER2: CEP 17 SIGNALS WAS 2.55. 40 TUMOR CELLS WERE SCORED. OF NOTE, THE TUMOR APPEARS TO SHOW HETEROGENEITY IN REGARDS TO HER2 EXPRESSION. Reference range: Ratio: HER2:CEP17 < 1.8 gene amplification not observed Ratio: HER2:CEP 17 1.8-2.2 - equivocal result Ratio: HER2:CEP17 > 2.2 - gene amplification observed Pecola Leisure MD Pathologist, Electronic Signature ( Signed 02/15/2012) 1 of ADDITIONAL INFORMATION: PROGNOSTIC INDICATORS - ACIS Results IMMUNOHISTOCHEMICAL AND MORPHOMETRIC ANALYSIS BY THE AUTOMATED CELLULAR IMAGING SYSTEM (ACIS) Estrogen Receptor (Negative, <1%): 0%, NEGATIVE Progesterone Receptor (Negative, <1%): 0%, NEGATIVE COMMENT: The negative hormone receptor study(ies) in this case have an internal positive control. All controls  stained appropriately Abigail Miyamoto MD Pathologist, Electronic Signature ( Signed 02/22/2012)  ASSESSMENT #1 Stage 1 HER-2 positive left breast cancer ER negative PR negative.  Patient is being considered for neoadjuvant chemotherapy consisting of Taxotere carboplatinum and herceptin as she does want breast conservation.  She understands the risks and benefits of this approach and  the side effects of her chemotherapy and Herceptin.  #2 She understands she will receive neulasta the day after chemotherapy to keep her white count up.    #3 She does have thrombocytopenia and we will monitor this  #4 Neutropenia  #5 Dehydration  PLAN:  #1 Ms. Schimpf will proceed with her weekly Herceptin.  I have asked that we reschedule her mammogram/breast ultrasound for re-evaluation of her breast cancer since she cannot have an MRI due to her cochlear implant.  I have also re-requested f/u appt with Dr. Dwain Sarna after her imaging studies.    #2 I will see her back next week for her weekly Herceptin.    #3 We discussed neutropenic precautions.  She will not go on antiobiotic prophylaxis due to her reactions to antibiotics in the past.  #4 She will receive 1 L IV fluids for dehydration today.    All questions were answered. The patient knows to call the clinic with any problems, questions or concerns. We can certainly see the patient much sooner if necessary.  I spent 25 minutes counseling the patient face to face. The total time spent in the appointment was 30 minutes.  Cherie Ouch Lyn Hollingshead, NP Medical Oncology [facility] Phone: [phone] 05/09/2012, 1:32 PM

## 2012-05-09 NOTE — Progress Notes (Signed)
1325 Patient VSS. IVF's and Zofran infused as ordered as well as treatment. Patient with no complaints of nausea, light-headedness or dizziness. Discharged ambulating with spouse. AVS provided.

## 2012-05-10 ENCOUNTER — Other Ambulatory Visit: Payer: Self-pay | Admitting: Emergency Medicine

## 2012-05-10 ENCOUNTER — Telehealth: Payer: Self-pay | Admitting: *Deleted

## 2012-05-10 ENCOUNTER — Inpatient Hospital Stay (HOSPITAL_COMMUNITY)
Admission: EM | Admit: 2012-05-10 | Discharge: 2012-05-21 | DRG: 584 | Disposition: A | Discharge status: Home / Self Care | Payer: BC Managed Care – PPO | Attending: Internal Medicine | Admitting: Internal Medicine

## 2012-05-10 ENCOUNTER — Other Ambulatory Visit: Payer: Self-pay | Admitting: Certified Registered Nurse Anesthetist

## 2012-05-10 ENCOUNTER — Telehealth: Payer: Self-pay | Admitting: Emergency Medicine

## 2012-05-10 ENCOUNTER — Encounter (HOSPITAL_COMMUNITY): Payer: Self-pay | Admitting: *Deleted

## 2012-05-10 ENCOUNTER — Emergency Department (HOSPITAL_COMMUNITY): Payer: BC Managed Care – PPO

## 2012-05-10 DIAGNOSIS — D709 Neutropenia, unspecified: Secondary | ICD-10-CM | POA: Diagnosis present

## 2012-05-10 DIAGNOSIS — D61818 Other pancytopenia: Secondary | ICD-10-CM | POA: Diagnosis present

## 2012-05-10 DIAGNOSIS — R5081 Fever presenting with conditions classified elsewhere: Secondary | ICD-10-CM | POA: Diagnosis present

## 2012-05-10 DIAGNOSIS — K219 Gastro-esophageal reflux disease without esophagitis: Secondary | ICD-10-CM | POA: Diagnosis present

## 2012-05-10 DIAGNOSIS — Z79899 Other long term (current) drug therapy: Secondary | ICD-10-CM

## 2012-05-10 DIAGNOSIS — C50419 Malignant neoplasm of upper-outer quadrant of unspecified female breast: Secondary | ICD-10-CM | POA: Diagnosis present

## 2012-05-10 DIAGNOSIS — M1990 Unspecified osteoarthritis, unspecified site: Secondary | ICD-10-CM | POA: Diagnosis present

## 2012-05-10 DIAGNOSIS — T451X5A Adverse effect of antineoplastic and immunosuppressive drugs, initial encounter: Secondary | ICD-10-CM | POA: Diagnosis present

## 2012-05-10 DIAGNOSIS — A4151 Sepsis due to Escherichia coli [E. coli]: Principal | ICD-10-CM | POA: Diagnosis present

## 2012-05-10 DIAGNOSIS — D696 Thrombocytopenia, unspecified: Secondary | ICD-10-CM | POA: Diagnosis present

## 2012-05-10 DIAGNOSIS — A419 Sepsis, unspecified organism: Secondary | ICD-10-CM | POA: Diagnosis present

## 2012-05-10 DIAGNOSIS — D638 Anemia in other chronic diseases classified elsewhere: Secondary | ICD-10-CM | POA: Diagnosis present

## 2012-05-10 DIAGNOSIS — E876 Hypokalemia: Secondary | ICD-10-CM | POA: Diagnosis present

## 2012-05-10 DIAGNOSIS — IMO0001 Reserved for inherently not codable concepts without codable children: Secondary | ICD-10-CM | POA: Diagnosis present

## 2012-05-10 DIAGNOSIS — Z9221 Personal history of antineoplastic chemotherapy: Secondary | ICD-10-CM

## 2012-05-10 DIAGNOSIS — M797 Fibromyalgia: Secondary | ICD-10-CM | POA: Diagnosis present

## 2012-05-10 DIAGNOSIS — R197 Diarrhea, unspecified: Secondary | ICD-10-CM | POA: Diagnosis present

## 2012-05-10 DIAGNOSIS — N390 Urinary tract infection, site not specified: Secondary | ICD-10-CM | POA: Diagnosis present

## 2012-05-10 DIAGNOSIS — E871 Hypo-osmolality and hyponatremia: Secondary | ICD-10-CM | POA: Diagnosis present

## 2012-05-10 DIAGNOSIS — D61810 Antineoplastic chemotherapy induced pancytopenia: Secondary | ICD-10-CM | POA: Diagnosis present

## 2012-05-10 DIAGNOSIS — I10 Essential (primary) hypertension: Secondary | ICD-10-CM | POA: Diagnosis present

## 2012-05-10 DIAGNOSIS — N179 Acute kidney failure, unspecified: Secondary | ICD-10-CM | POA: Diagnosis present

## 2012-05-10 LAB — CBC WITH DIFFERENTIAL/PLATELET
Basophils Relative: 0 % (ref 0–1)
Hemoglobin: 9.1 g/dL — ABNORMAL LOW (ref 12.0–15.0)
Lymphs Abs: 0 10*3/uL — ABNORMAL LOW (ref 0.7–4.0)
MCH: 31.2 pg (ref 26.0–34.0)
MCHC: 36.3 g/dL — ABNORMAL HIGH (ref 30.0–36.0)
Monocytes Absolute: 0 10*3/uL — ABNORMAL LOW (ref 0.1–1.0)
Neutrophils Relative %: 0 % — ABNORMAL LOW (ref 43–77)

## 2012-05-10 LAB — URINALYSIS, ROUTINE W REFLEX MICROSCOPIC
Glucose, UA: NEGATIVE mg/dL
Ketones, ur: 15 mg/dL — AB
Nitrite: POSITIVE — AB
Protein, ur: 100 mg/dL — AB
Specific Gravity, Urine: 1.018 (ref 1.005–1.030)
Urobilinogen, UA: 1 mg/dL (ref 0.0–1.0)
Urobilinogen, UA: 0.2 mg/dL (ref 0.0–1.0)

## 2012-05-10 LAB — URINE MICROSCOPIC-ADD ON

## 2012-05-10 LAB — PROCALCITONIN: Procalcitonin: 19.41 ng/mL

## 2012-05-10 LAB — COMPREHENSIVE METABOLIC PANEL
AST: 30 U/L (ref 0–37)
BUN: 12 mg/dL (ref 6–23)
CO2: 28 mEq/L (ref 19–32)
Calcium: 7.9 mg/dL — ABNORMAL LOW (ref 8.4–10.5)
Creatinine, Ser: 0.84 mg/dL (ref 0.50–1.10)
GFR calc Af Amer: 84 mL/min — ABNORMAL LOW (ref >90)
GFR calc non Af Amer: 72 mL/min — ABNORMAL LOW (ref >90)
Glucose, Bld: 110 mg/dL — ABNORMAL HIGH (ref 70–99)

## 2012-05-10 LAB — LACTIC ACID, PLASMA: Lactic Acid, Venous: 1.4 mmol/L (ref 0.5–2.2)

## 2012-05-10 MED ORDER — LORAZEPAM 0.5 MG PO TABS
0.5000 mg | ORAL_TABLET | Freq: Four times a day (QID) | ORAL | Status: DC | PRN
  Administered 2012-05-11 – 2012-05-17 (×4): 0.5 mg via ORAL
  Filled 2012-05-10 (×4): qty 1

## 2012-05-10 MED ORDER — SODIUM CHLORIDE 0.9 % IV SOLN
1000.0000 mL | Freq: Once | INTRAVENOUS | Status: AC
  Administered 2012-05-10: 1000 mL via INTRAVENOUS

## 2012-05-10 MED ORDER — HYDROCODONE-ACETAMINOPHEN 5-325 MG PO TABS: 1.0000 | ORAL_TABLET | ORAL | Status: DC | PRN

## 2012-05-10 MED ORDER — VANCOMYCIN HCL IN DEXTROSE 1-5 GM/200ML-% IV SOLN
1000.0000 mg | Freq: Once | INTRAVENOUS | Status: AC
  Administered 2012-05-10: 1000 mg via INTRAVENOUS
  Filled 2012-05-10: qty 200

## 2012-05-10 MED ORDER — ONDANSETRON HCL 4 MG PO TABS
4.0000 mg | ORAL_TABLET | Freq: Four times a day (QID) | ORAL | Status: DC | PRN
  Administered 2012-05-11 – 2012-05-16 (×4): 4 mg via ORAL
  Filled 2012-05-10 (×3): qty 1

## 2012-05-10 MED ORDER — ACETAMINOPHEN 650 MG RE SUPP: 650.0000 mg | Freq: Four times a day (QID) | RECTAL | Status: DC | PRN

## 2012-05-10 MED ORDER — OSELTAMIVIR PHOSPHATE 75 MG PO CAPS
75.0000 mg | ORAL_CAPSULE | Freq: Once | ORAL | Status: AC
  Administered 2012-05-10: 75 mg via ORAL
  Filled 2012-05-10: qty 1

## 2012-05-10 MED ORDER — POTASSIUM CHLORIDE 10 MEQ/100ML IV SOLN
10.0000 meq | Freq: Once | INTRAVENOUS | Status: AC
  Administered 2012-05-10: 10 meq via INTRAVENOUS
  Filled 2012-05-10: qty 100

## 2012-05-10 MED ORDER — GUAIFENESIN-CODEINE 100-10 MG/5ML PO SOLN: 10.0000 mL | ORAL | Status: DC | PRN

## 2012-05-10 MED ORDER — PANTOPRAZOLE SODIUM 40 MG PO TBEC
80.0000 mg | DELAYED_RELEASE_TABLET | Freq: Every day | ORAL | Status: DC
  Administered 2012-05-11 – 2012-05-21 (×10): 80 mg via ORAL
  Filled 2012-05-10 (×11): qty 2

## 2012-05-10 MED ORDER — ENOXAPARIN SODIUM 40 MG/0.4ML ~~LOC~~ SOLN
40.0000 mg | SUBCUTANEOUS | Status: DC
  Administered 2012-05-11: 40 mg via SUBCUTANEOUS
  Filled 2012-05-10: qty 0.4

## 2012-05-10 MED ORDER — LEVOTHYROXINE SODIUM 100 MCG PO TABS
100.0000 ug | ORAL_TABLET | Freq: Every day | ORAL | Status: DC
  Administered 2012-05-11 – 2012-05-21 (×10): 100 ug via ORAL
  Filled 2012-05-10 (×12): qty 1

## 2012-05-10 MED ORDER — SODIUM CHLORIDE 0.9 % IV SOLN
INTRAVENOUS | Status: AC
  Administered 2012-05-10: via INTRAVENOUS

## 2012-05-10 MED ORDER — MORPHINE SULFATE 2 MG/ML IJ SOLN: 2.0000 mg | INTRAMUSCULAR | Status: DC | PRN

## 2012-05-10 MED ORDER — ACETAMINOPHEN 325 MG PO TABS
650.0000 mg | ORAL_TABLET | Freq: Four times a day (QID) | ORAL | Status: DC | PRN
  Administered 2012-05-16 – 2012-05-20 (×4): 650 mg via ORAL
  Filled 2012-05-10 (×5): qty 2

## 2012-05-10 MED ORDER — ONDANSETRON HCL 4 MG/2ML IJ SOLN
4.0000 mg | Freq: Four times a day (QID) | INTRAMUSCULAR | Status: DC | PRN
  Administered 2012-05-10 – 2012-05-12 (×4): 4 mg via INTRAVENOUS
  Filled 2012-05-10 (×4): qty 2

## 2012-05-10 MED ORDER — SODIUM CHLORIDE 0.9 % IJ SOLN
3.0000 mL | Freq: Two times a day (BID) | INTRAMUSCULAR | Status: DC
  Administered 2012-05-11 – 2012-05-14 (×2): 3 mL via INTRAVENOUS

## 2012-05-10 MED ORDER — SODIUM CHLORIDE 0.9 % IV SOLN
1000.0000 mL | INTRAVENOUS | Status: DC
  Administered 2012-05-10: 1000 mL via INTRAVENOUS

## 2012-05-10 MED ORDER — DULOXETINE HCL 60 MG PO CPEP
60.0000 mg | ORAL_CAPSULE | Freq: Every day | ORAL | Status: DC
  Administered 2012-05-11 – 2012-05-21 (×10): 60 mg via ORAL
  Filled 2012-05-10 (×11): qty 1

## 2012-05-10 MED ORDER — PIPERACILLIN-TAZOBACTAM 3.375 G IVPB 30 MIN
3.3750 g | Freq: Once | INTRAVENOUS | Status: AC
  Administered 2012-05-10: 3.375 g via INTRAVENOUS
  Filled 2012-05-10: qty 50

## 2012-05-10 NOTE — ED Provider Notes (Signed)
History     CSN: [id number]  Arrival date & time 05/10/12  1751   First MD Initiated Contact with Patient 05/10/12 1756      Chief Complaint  Patient presents with  . Fever    (Consider location/radiation/quality/duration/timing/severity/associated sxs/prior treatment) Patient is a 62 y.o. female presenting with fever. The history is provided by the patient.  Fever Primary symptoms of the febrile illness include fever, fatigue, cough, nausea and diarrhea. Primary symptoms do not include headaches, shortness of breath, abdominal pain, vomiting or rash.   patient is felt bad the last day. She's had fever and chills and had some confusion yesterday. She is neutropenic on her breast cancer treatment. She was seen by oncology yesterday before she had a fever. She's had a mildly productive cough. She's had nausea without vomiting. She's had mild diarrhea. No dysuria. She states she has not been able to urinate much. No dominant pain. She states she feels weak all over.  Past Medical History  Diagnosis Date  . HTN (hypertension)   . Fibromyalgia   . GERD (gastroesophageal reflux disease)   . Arthritis   . Anxiety   . Dysrhythmia     irreg hr  . Wears hearing aid     left ear  . 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 PONV (postoperative nausea and vomiting)     Past Surgical History  Procedure Date  . Appendectomy   . Combined hysteroscopy diagnostic / d&c   . Cochlear implant   . Portacath placement 02/22/2012    Procedure: INSERTION PORT-A-CATH;  Surgeon: [name], MD;  Location: [facility];  Service: General;  Laterality: Right;    Family History  Problem Relation Age of Onset  . Cancer Mother     "tumor of face removed"  . Cancer Father     kidney removed  . Cancer Maternal Aunt     stomach & Pancreatic  . Cancer Paternal Uncle     pancreatic    History  Substance Use Topics  . Smoking status: Never Smoker   . Smokeless tobacco: Not on  file  . Alcohol Use: 0.0 oz/week    0-1 Glasses of wine per week    OB History    Grav Para Term Preterm Abortions TAB SAB Ect Mult Living                  Review of Systems  Constitutional: Positive for fever, appetite change and fatigue. Negative for activity change.  HENT: Negative for trouble swallowing and neck stiffness.   Eyes: Negative for pain.  Respiratory: Positive for cough. Negative for chest tightness and shortness of breath.   Cardiovascular: Negative for chest pain and leg swelling.  Gastrointestinal: Positive for nausea and diarrhea. Negative for vomiting and abdominal pain.  Genitourinary: Positive for decreased urine volume. Negative for hematuria and flank pain.  Musculoskeletal: Negative for back pain.  Skin: Negative for rash.  Neurological: Positive for weakness. Negative for numbness and headaches.  Psychiatric/Behavioral: Negative for behavioral problems.    Allergies  Acyclovir and related; Ciprofloxacin hcl; and Bactrim  Home Medications   No current outpatient prescriptions on file.  BP 140/60  Pulse 109  Temp 98.2 F (36.8 C) (Oral)  Resp 17  Ht 5\' 2"  (1.575 m)  Wt 182 lb 8.7 oz (82.8 kg)  BMI 33.39 kg/m2  SpO2 100%  Physical Exam  Nursing note and vitals reviewed. Constitutional: She is oriented to person,  place, and time. She appears well-developed and well-nourished.  HENT:  Head: Normocephalic and atraumatic.  Eyes: EOM are normal. Pupils are equal, round, and reactive to light.  Neck: Normal range of motion. Neck supple.  Cardiovascular: Regular rhythm and normal heart sounds.   No murmur heard.      Tachycardia  Pulmonary/Chest: Effort normal and breath sounds normal. No respiratory distress. She has no wheezes. She has no rales.  Abdominal: Soft. Bowel sounds are normal. She exhibits no distension. There is no tenderness. There is no rebound and no guarding.  Musculoskeletal: Normal range of motion.  Neurological: She is alert  and oriented to person, place, and time. No cranial nerve deficit.  Skin: Skin is warm and dry.  Psychiatric: She has a normal mood and affect. Her speech is normal.    ED Course  Procedures (including critical care time)  Labs Reviewed  CBC WITH DIFFERENTIAL - Abnormal; Notable for the following:    WBC 0.7 (*)     RBC 2.92 (*)     Hemoglobin 9.1 (*)     HCT 25.1 (*)     MCHC 36.3 (*)     RDW 17.3 (*)     Platelets 67 (*)     Neutrophils Relative 0 (*)     Lymphocytes Relative 0 (*)     Monocytes Relative 0 (*)     Lymphs Abs 0.0 (*)     Monocytes Absolute 0.0 (*)     All other components within normal limits  COMPREHENSIVE METABOLIC PANEL - Abnormal; Notable for the following:    Sodium 125 (*)     Potassium 2.4 (*)     Chloride 85 (*)     Glucose, Bld 110 (*)     Calcium 7.9 (*)     Total Protein 5.9 (*)     Albumin 2.8 (*)     ALT 69 (*)     Total Bilirubin 3.9 (*)     GFR calc non Af Amer 72 (*)     GFR calc Af Amer 84 (*)     All other components within normal limits  URINALYSIS, ROUTINE W REFLEX MICROSCOPIC - Abnormal; Notable for the following:    Color, Urine ORANGE (*)  BIOCHEMICALS MAY BE AFFECTED BY COLOR   APPearance CLOUDY (*)     Hgb urine dipstick LARGE (*)     Bilirubin Urine MODERATE (*)     Ketones, ur 15 (*)     Protein, ur 100 (*)     Nitrite POSITIVE (*)     Leukocytes, UA SMALL (*)     All other components within normal limits  URINE MICROSCOPIC-ADD ON - Abnormal; Notable for the following:    Squamous Epithelial / LPF FEW (*)     Bacteria, UA FEW (*)     All other components within normal limits  URINALYSIS, ROUTINE W REFLEX MICROSCOPIC - Abnormal; Notable for the following:    Color, Urine AMBER (*)  BIOCHEMICALS MAY BE AFFECTED BY COLOR   Hgb urine dipstick TRACE (*)     Bilirubin Urine SMALL (*)     Ketones, ur TRACE (*)     Protein, ur 100 (*)     Leukocytes, UA TRACE (*)     All other components within normal limits  URINE  MICROSCOPIC-ADD ON - Abnormal; Notable for the following:    Casts HYALINE CASTS (*)     All other components within normal limits  LACTIC ACID, PLASMA  PROCALCITONIN  CULTURE, BLOOD (ROUTINE X 2)  CULTURE, BLOOD (ROUTINE X 2)  URINE CULTURE  INFLUENZA PANEL BY PCR  PATHOLOGIST SMEAR REVIEW  CBC  BASIC METABOLIC PANEL  MAGNESIUM   Dg Chest Port 1 View  05/10/2012  *RADIOLOGY REPORT*  Clinical Data: Neutropenic fever  PORTABLE CHEST - 1 VIEW  Comparison:  04/27/2012  Findings: Cardiomediastinal silhouette is stable.  Right Port-A- Cath is unchanged in position.  No acute infiltrate or pleural effusion.  No pulmonary edema.  IMPRESSION: No active disease.  No significant change.   Original Report Authenticated By: Natasha Mead, M.D.      1. Neutropenic fever   2. UTI (urinary tract infection)   3. Reflux   4. Cancer of upper-outer quadrant of female breast     CRITICAL CARE Performed by: Billee Cashing   Total critical care time: 30  Critical care time was exclusive of separately billable procedures and treating other patients.  Critical care was necessary to treat or prevent imminent or life-threatening deterioration.  Critical care was time spent personally by me on the following activities: development of treatment plan with patient and/or surrogate as well as nursing, discussions with consultants, evaluation of patient's response to treatment, examination of patient, obtaining history from patient or surrogate, ordering and performing treatments and interventions, ordering and review of laboratory studies, ordering and review of radiographic studies, pulse oximetry and re-evaluation of patient's condition.   MDM  Patient presents with neutropenic fever. Urine is the likely source. Is cloudy and has some bacteria. Flu sent. Blood cultures been sent. Patient was given Zosyn and vancomycin. Blood pressures been maintained the patient has been tachycardic. She will be admitted  to step down unit.   Juliet Rude. Rubin Payor, MD 05/11/12 1610

## 2012-05-10 NOTE — ED Notes (Signed)
Called to give report, receiving nurse will return phone call.  

## 2012-05-10 NOTE — Telephone Encounter (Signed)
Spouse Maria Mckinney calling in to report that patient had some disorientation, shakes, chills and temp that started last night. Patient has not called in any earlier today, states that she has been sleeping most of the day. Denies any other symptoms or complaints. Temps were 100.1 and 100.5.  Counts noted yesterday in office to be neutropenic. Rec'd Herceptin only. Will contact Dr Welton Flakes office staff with this information for further assesment. Informed Maria Mckinney we will call him back with a plan.

## 2012-05-10 NOTE — ED Notes (Signed)
Pts husband reports that pt had fever, chills, and was "out of her head" last night. On arrival pts oral temp 102.5. Pt being treated for breast cancer.

## 2012-05-10 NOTE — H&P (Signed)
[facility] ADMISSION [facility]     Date: 05/10/2012               Patient Name:  Maria Mckinney MRN: [id number]  DOB: Oct 07, 1950 Age / Sex: 62 y.o., female   PCP: [facility]      Chief Complaint: fever, chills and nausea  History of Present Illness: Patient is a 62 y.o. female with a PMHx of breast cancer currently undergoing chemotherapy, who presents to [facility] for evaluation of fever, chills and nausea. Patient was apparently doing well until yesterday when she went for her chemotherapy session. Patient completed her chemotherapy session without any problems. She started feeling fever which was noted to be 100.5 at home which was associated with chills. Patient also complained of nausea which started one day before admission. Patient complained of weakness all over on the day of admission. She also has an eaten anything since morning due to persistent nausea. Patient also complains of difficulty urination although she denies abdominal pain at this time.  Patient evaluation in the emergency room was suggestive of fevers of 102.5, severely neutropenic and urine analysis consistent with infection.  Review of Systems: Constitutional:  reports fever, chills, diaphoresis, appetite change and fatigue.  HEENT: denies photophobia, eye pain, redness, hearing loss, ear pain, congestion, sore throat, rhinorrhea, sneezing, neck pain, neck stiffness and tinnitus.  Respiratory: denies SOB, DOE, x chest tightness, and wheezing,+ cough.  Cardiovascular: denies chest pain, palpitations and leg swelling.  Gastrointestinal: reports nausea, diarrhea. Denies vomiting, abdominal pain, constipation, blood in stool.  Genitourinary: denies dysuria, urgency, frequency, hematuria, flank pain and difficulty urinating.  Musculoskeletal: denies  myalgias, back pain, joint swelling, arthralgias and gait problem.   Skin: denies pallor, rash and wound.  Neurological: denies dizziness, seizures, syncope,   light-headedness, numbness and headaches. Positive for weakness  Hematological: denies adenopathy, easy bruising, personal or family bleeding history.  Psychiatric/ Behavioral: denies suicidal ideation, mood changes, confusion, nervousness, sleep disturbance and agitation.    Current Outpatient Medications: Current Facility-Administered Medications  Medication Dose Route Frequency Provider Last Rate Last Dose  . 0.9 %  sodium chloride infusion  1,000 mL Intravenous Continuous Juliet Rude. Rubin Payor, MD 125 mL/hr at 05/10/12 2134 1,000 mL at 05/10/12 2134   Current Outpatient Prescriptions  Medication Sig Dispense Refill  . Alum & Mag Hydroxide-Simeth (MAGIC MOUTHWASH W/LIDOCAINE) SOLN Take 5 mLs by mouth 4 (four) times daily.  200 mL  6  . bisoprolol-hydrochlorothiazide (ZIAC) 5-6.25 MG per tablet Take 1 tablet by mouth daily.      . cyclobenzaprine (FLEXERIL) 10 MG tablet Take 10 mg by mouth 3 (three) times daily as needed. For muscle pain.      . DULoxetine (CYMBALTA) 60 MG capsule Take 60 mg by mouth daily.      Marland Kitchen esomeprazole (NEXIUM) 40 MG capsule Take 1 capsule (40 mg total) by mouth daily before breakfast.  30 capsule  6  . HYDROcodone-acetaminophen (VICODIN) 5-500 MG per tablet Take 1 tablet by mouth every 6 (six) hours as needed. For pain.      Marland Kitchen levothyroxine (SYNTHROID, LEVOTHROID) 100 MCG tablet Take 100 mcg by mouth daily.      Marland Kitchen lidocaine-prilocaine (EMLA) cream Apply 1 application topically as needed. For port-a-cath access.      Marland Kitchen LORazepam (ATIVAN) 0.5 MG tablet Take 1 tablet (0.5 mg total) by mouth every 6 (six) hours as needed (Nausea or vomiting).  30 tablet  0  . meloxicam (MOBIC) 15 MG tablet Take 15  mg by mouth daily.      . mometasone (NASONEX) 50 MCG/ACT nasal spray Place 2 sprays into the nose daily.      . ondansetron (ZOFRAN) 8 MG tablet Take 1 tablet (8 mg total) by mouth 2 (two) times daily. Take BID starting the day after chemo x 3 days. Then BID PRN nausea or  vomiting.  30 tablet  1  . PRESCRIPTION MEDICATION Inject into the vein every 7 (seven) days. Herceptin infusion every Wednesday.      . prochlorperazine (COMPAZINE) 10 MG tablet Take 1 tablet (10 mg total) by mouth every 6 (six) hours as needed (Nausea or vomiting).  30 tablet  1  . promethazine (PHENERGAN) 25 MG tablet Take 1 tablet (25 mg total) by mouth every 6 (six) hours as needed for nausea.  60 tablet  0  . pseudoephedrine (SUDAFED) 30 MG tablet Take 30 mg by mouth every 4 (four) hours as needed.        Allergies: Allergies  Allergen Reactions  . Acyclovir And Related   . Ciprofloxacin Hcl Rash  . Bactrim (Sulfamethoxazole W-Trimethoprim)      Past Medical History: Past Medical History  Diagnosis Date  . HTN (hypertension)   . Fibromyalgia   . GERD (gastroesophageal reflux disease)   . Arthritis   . Anxiety   . Dysrhythmia     irreg hr  . Wears hearing aid     left ear  . 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 PONV (postoperative nausea and vomiting)     Past Surgical History: Past Surgical History  Procedure Date  . Appendectomy   . Combined hysteroscopy diagnostic / d&c   . Cochlear implant   . Portacath placement 02/22/2012    Procedure: INSERTION PORT-A-CATH;  Surgeon: [name], MD;  Location: [facility];  Service: General;  Laterality: Right;    Family History: Family History  Problem Relation Age of Onset  . Cancer Mother     "tumor of face removed"  . Cancer Father     kidney removed  . Cancer Maternal Aunt     stomach & Pancreatic  . Cancer Paternal Uncle     pancreatic    Social History: History   Social History  . Marital Status: Married    Spouse Name: N/A    Number of Children: N/A  . Years of Education: N/A   Occupational History  . Not on file.   Social History Main Topics  . Smoking status: Never Smoker   . Smokeless tobacco: Not on file  . Alcohol Use: 0.0 oz/week    0-1 Glasses of wine per week  .  Drug Use: No  . Sexually Active: Not Currently   Other Topics Concern  . Not on file   Social History Narrative  . No narrative on file     Vital Signs: Blood pressure 124/72, pulse 129, temperature 100 F (37.8 C), temperature source Oral, resp. rate 14, SpO2 100.00%.  Physical Exam: General: Vital signs reviewed and noted. Well-developed, well-nourished, in no acute distress; alert, appropriate and cooperative throughout examination.  Head: Normocephalic, atraumatic.  Eyes: PERRL, EOMI, No signs of anemia or jaundince.  Nose: Mucous membranes moist, not inflammed, nonerythematous.  Throat: Dry mucous membrane, no exudate appreciated.   Neck: No deformities, masses, or tenderness noted.Supple, No carotid Bruits, no JVD.  Lungs:  Normal respiratory effort. Clear to auscultation BL without crackles or wheezes.  Heart:  Tachycardia,  S1 and S2 normal without gallop, murmur, or rubs.R  Abdomen:  BS normoactive. Soft, Nondistended, suprapubic tenderness.  No masses or organomegaly.  Extremities: No pretibial edema.  Neurologic: A&O X3, CN II - XII are grossly intact. Motor strength is 5/5 in the all 4 extremities, Sensations intact to light touch, Cerebellar signs negative.  Skin: No visible rashes, scars.   Lab results: CBC:    Component Value Date/Time   WBC 0.7* 05/10/2012 1909   WBC 1.0* 05/09/2012 0939   HGB 9.1* 05/10/2012 1909   HGB 11.8 05/09/2012 0939   HCT 25.1* 05/10/2012 1909   HCT 33.1* 05/09/2012 0939   PLT 67* 05/10/2012 1909   PLT 127* 05/09/2012 0939   MCV 86.0 05/10/2012 1909   MCV 86.2 05/09/2012 0939   NEUTROABS 0.3* 05/09/2012 0939   LYMPHSABS 0.0* 05/10/2012 1909   LYMPHSABS 0.6* 05/09/2012 0939   MONOABS 0.0* 05/10/2012 1909   MONOABS 0.1 05/09/2012 0939   EOSABS 0.0 05/10/2012 1909   EOSABS 0.0 05/09/2012 0939   BASOSABS 0.0 05/10/2012 1909   BASOSABS 0.0 05/09/2012 0939     Metabolic Panel:    Component Value Date/Time   NA 125* 05/10/2012 1909   NA 129 Sample Drawn by RN*  05/09/2012 0939   K 2.4* 05/10/2012 1909   K 3.0 Repeated and Verified* 05/09/2012 0939   CL 85* 05/10/2012 1909   CL 89* 05/09/2012 0939   CO2 28 05/10/2012 1909   CO2 28 05/09/2012 0939   BUN 12 05/10/2012 1909   BUN 22.0 05/09/2012 0939   CREATININE 0.84 05/10/2012 1909   CREATININE 1.0 05/09/2012 0939   GLUCOSE 110* 05/10/2012 1909   GLUCOSE 142* 05/09/2012 0939   CALCIUM 7.9* 05/10/2012 1909   CALCIUM 8.8 05/09/2012 0939   AST 30 05/10/2012 1909   AST 78* 05/09/2012 0939   ALT 69* 05/10/2012 1909   ALT 137* 05/09/2012 0939   ALKPHOS 59 05/10/2012 1909   ALKPHOS 85 05/09/2012 0939   BILITOT 3.9* 05/10/2012 1909   BILITOT 2.42* 05/09/2012 0939   PROT 5.9* 05/10/2012 1909   PROT 6.2* 05/09/2012 0939   ALBUMIN 2.8* 05/10/2012 1909   ALBUMIN 3.9 05/09/2012 0939     Urinalysis:  Basename 05/10/12 2011  COLORURINE ORANGE*  LABSPEC 1.020  PHURINE 6.5  GLUCOSEU NEGATIVE  HGBUR LARGE*  BILIRUBINUR MODERATE*  KETONESUR 15*  PROTEINUR 100*  UROBILINOGEN 1.0  NITRITE POSITIVE*  LEUKOCYTESUR SMALL*       Imaging results:  Dg Chest Port 1 View  05/10/2012  *RADIOLOGY REPORT*  Clinical Data: Neutropenic fever  PORTABLE CHEST - 1 VIEW  Comparison:  04/27/2012  Findings: Cardiomediastinal silhouette is stable.  Right Port-A- Cath is unchanged in position.  No acute infiltrate or pleural effusion.  No pulmonary edema.  IMPRESSION: No active disease.  No significant change.   Original Report Authenticated By: Natasha Mead, M.D.      Other results: No new EKG   Assessment & Plan:  Pt is a 62 y.o. yo female with a PMHx of breast cancer who was admitted on 05/10/2012 with symptoms of fever, chills, nausea and difficulty urination , which started a day prior to admission. Her last chemotherapy was yesterday(05/09/2012). Interventions at this time will be focused on management of UTI and neutropenia.   UTI: Patient reports having difficulty in urination along with fever, chills, nausea, generalized weakness and decreased appetite for 1  day. On exam , she had suprapubic tenderness. Her UA is positive for nitrites and  leucocytes with 7-10 WBC's. - treat with vanc and zosyn in the setting of neutropenia. -F/U urine and blood cultures. -admit to stepdown at this time and switch to lower level of care when stable  Neutropenia: likely related to chemotherapy. Her last chemotherapy was yesterday. Her WBC is 0.7 with ANC of 0.  Empirically treat vanc and zosyn.  Patient may benefit from neulasta as noted in the notes from Oncology.  -consider oncology/hematology consult in AM  Hyponatremia: likely 2/2 dehydration in the setting of decreased intake and chronic diarrhea. She appears hypovolemic on exam.  -IVF  Hypokalemia: could be secondary to chronic diarrhea.  Check Mg Replete Check BMP in AM  Breast cancer: Her -2 positive , ER/PR negative dx in Oct 2013. Follows with Dr. Welton Flakes   DVT PPX - low molecular weight heparin  Family communication: Patient and husband at the bedside were updated on the plan of care.   CODE STATUS - Full  DISPO - Disposition is deferred at this time, awaiting improvement of symptoms.   Anticipated discharge in approximately 3-4 day(s).  T Signed: Lars Mage, MD   05/10/2012, 10:01 PM

## 2012-05-10 NOTE — ED Notes (Signed)
[id number]<AV> Expected date:<BR> Expected time:<BR> Means of arrival:<BR> Comments:<BR> Hold for [employer]

## 2012-05-10 NOTE — Telephone Encounter (Signed)
Per Lillia Abed NP, instructed patient's spouse to take Maria Mckinney to the ED for eval.  Spouse verbalized understanding.  Called Fenton Foy RN in the ED to notify that patient is coming.

## 2012-05-10 NOTE — ED Notes (Signed)
Dr Rubin Payor notified of wbc 0.7; no orders given

## 2012-05-11 ENCOUNTER — Encounter (HOSPITAL_COMMUNITY): Payer: Self-pay

## 2012-05-11 DIAGNOSIS — IMO0001 Reserved for inherently not codable concepts without codable children: Secondary | ICD-10-CM

## 2012-05-11 DIAGNOSIS — M129 Arthropathy, unspecified: Secondary | ICD-10-CM

## 2012-05-11 DIAGNOSIS — C50419 Malignant neoplasm of upper-outer quadrant of unspecified female breast: Secondary | ICD-10-CM

## 2012-05-11 LAB — BASIC METABOLIC PANEL WITH GFR
BUN: 7 mg/dL (ref 6–23)
CO2: 24 meq/L (ref 19–32)
Calcium: 7.6 mg/dL — ABNORMAL LOW (ref 8.4–10.5)
Chloride: 90 meq/L — ABNORMAL LOW (ref 96–112)
Creatinine, Ser: 0.61 mg/dL (ref 0.50–1.10)
GFR calc Af Amer: >90 mL/min
GFR calc non Af Amer: >90 mL/min
Glucose, Bld: 113 mg/dL — ABNORMAL HIGH (ref 70–99)
Potassium: 2.5 meq/L — CL (ref 3.5–5.1)
Sodium: 126 meq/L — ABNORMAL LOW (ref 135–145)

## 2012-05-11 LAB — INFLUENZA PANEL BY PCR (TYPE A & B)
H1N1 flu by pcr: NOT DETECTED
Influenza A By PCR: NEGATIVE

## 2012-05-11 LAB — CBC
MCH: 30.7 pg (ref 26.0–34.0)
MCHC: 35.6 g/dL (ref 30.0–36.0)
Platelets: 66 10*3/uL — ABNORMAL LOW (ref 150–400)
RBC: 2.74 MIL/uL — ABNORMAL LOW (ref 3.87–5.11)

## 2012-05-11 LAB — BASIC METABOLIC PANEL
BUN: 7 mg/dL (ref 6–23)
Calcium: 7.5 mg/dL — ABNORMAL LOW (ref 8.4–10.5)
Calcium: 7.9 mg/dL — ABNORMAL LOW (ref 8.4–10.5)
Creatinine, Ser: 0.58 mg/dL (ref 0.50–1.10)
GFR calc Af Amer: >90 mL/min (ref >90)
GFR calc non Af Amer: 88 mL/min — ABNORMAL LOW (ref >90)
GFR calc non Af Amer: >90 mL/min (ref >90)
Glucose, Bld: 138 mg/dL — ABNORMAL HIGH (ref 70–99)
Potassium: 2.8 mEq/L — ABNORMAL LOW (ref 3.5–5.1)
Sodium: 125 mEq/L — ABNORMAL LOW (ref 135–145)

## 2012-05-11 LAB — MAGNESIUM: Magnesium: 0.9 mg/dL — CL (ref 1.5–2.5)

## 2012-05-11 LAB — RETICULOCYTES
RBC.: 2.81 MIL/uL — ABNORMAL LOW (ref 3.87–5.11)
Retic Count, Absolute: 11.2 10*3/uL — ABNORMAL LOW (ref 19.0–186.0)
Retic Ct Pct: 0.4 % (ref 0.4–3.1)

## 2012-05-11 LAB — PATHOLOGIST SMEAR REVIEW

## 2012-05-11 LAB — PROCALCITONIN: Procalcitonin: 8 ng/mL

## 2012-05-11 LAB — MRSA PCR SCREENING: MRSA by PCR: NEGATIVE

## 2012-05-11 MED ORDER — MAGNESIUM SULFATE 40 MG/ML IJ SOLN
4.0000 g | Freq: Once | INTRAMUSCULAR | Status: AC
  Administered 2012-05-11: 4 g via INTRAVENOUS
  Filled 2012-05-11: qty 100

## 2012-05-11 MED ORDER — VANCOMYCIN HCL IN DEXTROSE 1-5 GM/200ML-% IV SOLN
1000.0000 mg | Freq: Two times a day (BID) | INTRAVENOUS | Status: DC
  Administered 2012-05-11 – 2012-05-15 (×9): 1000 mg via INTRAVENOUS
  Filled 2012-05-11 (×10): qty 200

## 2012-05-11 MED ORDER — POTASSIUM CHLORIDE 10 MEQ/50ML IV SOLN
10.0000 meq | INTRAVENOUS | Status: AC
  Administered 2012-05-11 (×6): 10 meq via INTRAVENOUS
  Filled 2012-05-11 (×3): qty 50
  Filled 2012-05-11: qty 300

## 2012-05-11 MED ORDER — PROMETHAZINE HCL 25 MG/ML IJ SOLN
12.5000 mg | Freq: Once | INTRAMUSCULAR | Status: AC
  Administered 2012-05-11: 12.5 mg via INTRAVENOUS
  Filled 2012-05-11: qty 1

## 2012-05-11 MED ORDER — MAGNESIUM SULFATE 40 MG/ML IJ SOLN
2.0000 g | Freq: Once | INTRAMUSCULAR | Status: AC
  Administered 2012-05-11: 2 g via INTRAVENOUS
  Filled 2012-05-11: qty 50

## 2012-05-11 MED ORDER — PIPERACILLIN-TAZOBACTAM 3.375 G IVPB
3.3750 g | Freq: Three times a day (TID) | INTRAVENOUS | Status: DC
  Administered 2012-05-11 – 2012-05-18 (×20): 3.375 g via INTRAVENOUS
  Filled 2012-05-11 (×25): qty 50

## 2012-05-11 MED ORDER — POTASSIUM CHLORIDE 10 MEQ/50ML IV SOLN
10.0000 meq | INTRAVENOUS | Status: AC
  Administered 2012-05-11 (×6): 10 meq via INTRAVENOUS
  Filled 2012-05-11 (×6): qty 50

## 2012-05-11 MED ORDER — SODIUM CHLORIDE 0.9 % IJ SOLN
10.0000 mL | INTRAMUSCULAR | Status: DC | PRN
  Administered 2012-05-13 – 2012-05-14 (×2): 10 mL

## 2012-05-11 MED ORDER — SODIUM CHLORIDE 0.9 % IV SOLN
INTRAVENOUS | Status: DC
  Administered 2012-05-11: 19:00:00 via INTRAVENOUS
  Administered 2012-05-12: 1000 mL via INTRAVENOUS
  Administered 2012-05-13: 13:00:00 via INTRAVENOUS
  Administered 2012-05-16: 1000 mL via INTRAVENOUS

## 2012-05-11 NOTE — Progress Notes (Signed)
ANTIBIOTIC CONSULT NOTE - INITIAL  Pharmacy Consult for Vancomycin and Zosyn  Indication: Neutropenia/UTI  Allergies  Allergen Reactions  . Acyclovir And Related   . Ciprofloxacin Hcl Rash  . Bactrim (Sulfamethoxazole W-Trimethoprim)     Patient Measurements: Height: 5\' 2"  (157.5 cm) Weight: 182 lb 8.7 oz (82.8 kg) IBW/kg (Calculated) : 50.1  Adjusted Body Weight:   Vital Signs: Temp: 98.2 F (36.8 C) (01/10 0000) Temp src: Oral (01/10 0000) BP: 140/60 mmHg (01/10 0000) Pulse Rate: 109  (01/10 0000) Intake/Output from previous day:   Intake/Output from this shift:    Labs:  Basename 05/10/12 1909 05/09/12 0939 05/09/12 0939  WBC 0.7* -- 1.0*  HGB 9.1* -- 11.8  PLT 67* -- 127*  LABCREA -- -- --  CREATININE 0.84 1.0 --   Estimated Creatinine Clearance: 68.4 ml/min (by C-G formula based on Cr of 0.84). No results found for this basename: VANCOTROUGH:2,VANCOPEAK:2,VANCORANDOM:2,GENTTROUGH:2,GENTPEAK:2,GENTRANDOM:2,TOBRATROUGH:2,TOBRAPEAK:2,TOBRARND:2,AMIKACINPEAK:2,AMIKACINTROU:2,AMIKACIN:2, in the last 72 hours   Microbiology: No results found for this or any previous visit (from the past 720 hour(s)).  Medical History: Past Medical History  Diagnosis Date  . HTN (hypertension)   . Fibromyalgia   . GERD (gastroesophageal reflux disease)   . Arthritis   . Anxiety   . Dysrhythmia     irreg hr  . Wears hearing aid     left ear  . 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 PONV (postoperative nausea and vomiting)     Medications:  Anti-infectives     Start     Dose/Rate Route Frequency Ordered Stop   05/11/12 0800   vancomycin (VANCOCIN) IVPB 1000 mg/200 mL premix        1,000 mg 200 mL/hr over 60 Minutes Intravenous Every 12 hours 05/11/12 0023     05/11/12 0100   piperacillin-tazobactam (ZOSYN) IVPB 3.375 g        3.375 g 12.5 mL/hr over 240 Minutes Intravenous 3 times per day 05/11/12 0023     05/10/12 1830   piperacillin-tazobactam (ZOSYN) IVPB 3.375 g         3.375 g 100 mL/hr over 30 Minutes Intravenous  Once 05/10/12 1811 05/10/12 2020   05/10/12 1830   vancomycin (VANCOCIN) IVPB 1000 mg/200 mL premix        1,000 mg 200 mL/hr over 60 Minutes Intravenous  Once 05/10/12 1811 05/10/12 2135   05/10/12 1830   oseltamivir (TAMIFLU) capsule 75 mg        75 mg Oral  Once 05/10/12 1811 05/10/12 1907         Assessment: Patient with fever, chills.  First dose of antibiotics already given in ED.  Pharmacy to dose for neutropenia/UTI.    Goal of Therapy:  Vancomycin trough level 15-20 mcg/ml Zosyn based on renal function   Plan:  Measure antibiotic drug levels at steady state Follow up culture results Vancomycin 1gm iv q12hr Zosyn 3.375g IV Q8H infused over 4hrs.   Darlina Guys, Jacquenette Shone Crowford 05/11/2012,12:25 AM

## 2012-05-11 NOTE — Progress Notes (Signed)
Patient transferring to room 1511.  Report called to Aram Beecham, Charity fundraiser.  Patient will transfer by wheelchair.  Will continue to monitor.

## 2012-05-11 NOTE — Progress Notes (Signed)
Patient ID: Maria Mckinney, female   DOB: [birth year], 62 y.o.   MRN: [id number] [facility] PROGRESS NOTE  NATHALI VENT NUU:[id number] DOB: 12-May-1950 DOA: 05/10/2012 PCP: [name], MD  Brief narrative: Patient is a 62 y.o. female with a PMHx of breast cancer currently undergoing chemotherapy, who presents to [facility] for evaluation of fever, chills and nausea. Patient was apparently doing well until one day prior to admission when she went for her chemotherapy session. Patient completed her chemotherapy session without any problems. She started feeling fever which was noted to be 100.5 at home, associated with chills. Patient also complained of nausea which started one day before admission, a/w weakness all over on the day of admission. She also has not eaten anything since morning due to persistent nausea. Patient also complains of difficulty urination although she denies abdominal pain.  Patient evaluation in the emergency room was suggestive of fevers of 102.5 F, severely neutropenic and urine analysis consistent with infection.  Principal Problem:  *Urinary tract infection - no fevers noted since admission - pt reports feeling better - will continue current broad spectrum ABX until final blood culture and urine cultures back - continue supportive care with IVF, analgesia and antiemetics as needed Active Problems:  Cancer of upper-outer quadrant of female breast - will notify primary oncologist of pt's admission  Neutropenia - likely sequela of chemotherapy in the setting of UTI - continue broad spectrum ABX for now - follow up on urine and blood culture - CBC with diff in AM  GERD (gastroesophageal reflux disease) - stable  Hypertension - stable, monitor on telemetry floor  Pancytopenia - likely sequela of chemotherapy - treatment with ABX as noted above - hold heparin products and place SCD's, obtain FOBT and anemia panel - CBC in AM  Hyponatremia - secondary to  dehydration  - IVF and BMP in AM  Electrolytes disturbance - will supplement potassium and magnesium - BMP in AM  Consultants:  None  Procedures/Studies: Dg Chest Port 1 View 05/10/2012  No active disease.  No significant change.    Antibiotics:  Vancomycin 01/09 -->  Zosyn 01/09 -->  Code Status: Full Family Communication: Pt at bedside Disposition Plan: Home when medically stable  HPI/Subjective: No events overnight.   Objective: Filed Vitals:   05/11/12 1200 05/11/12 1400 05/11/12 1600 05/11/12 1805  BP: 146/63 148/77 141/69 138/79  Pulse: 110 111 111 114  Temp:   98 F (36.7 C) 98.3 F (36.8 C)  TempSrc:   Oral Oral  Resp: 17 11 14    Height:      Weight:      SpO2: 100% 99% 99% 98%    Intake/Output Summary (Last 24 hours) at 05/11/12 1836 Last data filed at 05/11/12 1730  Gross per 24 hour  Intake 2549.92 ml  Output    400 ml  Net 2149.92 ml    Exam:   General:  Pt is alert, follows commands appropriately, not in acute distress  Cardiovascular: Regular rate and rhythm, S1/S2, no murmurs, no rubs, no gallops  Respiratory: Clear to auscultation bilaterally, decreased breath sounds at bases  Abdomen: Soft, non tender, non distended, bowel sounds present, no guarding  Extremities: No edema, pulses DP and PT palpable bilaterally  Neuro: Grossly nonfocal  Data Reviewed: Basic Metabolic Panel:  Lab 05/11/12 3474 05/10/12 2359 05/10/12 1909 05/09/12 0939  NA 126* 125* 125* 129 Sample Drawn by RN*  K 2.5* 2.2* 2.4* 3.0 Repeated and Verified*  CL 90* 87*  85* 89*  CO2 24 24 28 28   GLUCOSE 113* 99 110* 142*  BUN 7 10 12  22.0  CREATININE 0.61 0.75 0.84 1.0  CALCIUM 7.6* 7.5* 7.9* 8.8  MG -- 0.9* -- --  PHOS -- -- -- --   Liver Function Tests:  Lab 05/10/12 1909 05/09/12 0939  AST 30 78*  ALT 69* 137*  ALKPHOS 59 85  BILITOT 3.9* 2.42*  PROT 5.9* 6.2*  ALBUMIN 2.8* 3.9   CBC:  Lab 05/11/12 1225 05/10/12 1909 05/09/12 0939  WBC 0.9*  0.7* 1.0*  NEUTROABS -- -- 0.3*  HGB 8.4* 9.1* 11.8  HCT 23.6* 25.1* 33.1*  MCV 86.1 86.0 86.2  PLT 66* 67* 127*    Recent Results (from the past 240 hour(s))  MRSA PCR SCREENING     Status: Normal   Collection Time   05/11/12  1:22 AM      Component Value Range Status Comment   MRSA by PCR NEGATIVE  NEGATIVE Final      Scheduled Meds:   . DULoxetine  60 mg Oral Daily  . enoxaparin (LOVENOX) injection  40 mg Subcutaneous Q24H  . levothyroxine  100 mcg Oral Daily  . pantoprazole  80 mg Oral Q1200  . piperacillin-tazobactam (ZOSYN)  IV  3.375 g Intravenous Q8H  . potassium chloride  10 mEq Intravenous Q1 Hr x 6  . sodium chloride  3 mL Intravenous Q12H  . vancomycin  1,000 mg Intravenous Q12H   Continuous Infusions:    Debbora Presto, MD  [facility] Pager [phone]  If 7PM-7AM, please contact night-coverage [email or web address] Password [facility] 05/11/2012, 6:36 PM   LOS: 1 day

## 2012-05-11 NOTE — Progress Notes (Signed)
Hypomagnesemia and hypokalemia   Mg and K replaced

## 2012-05-12 LAB — BASIC METABOLIC PANEL
BUN: 9 mg/dL (ref 6–23)
Calcium: 8.2 mg/dL — ABNORMAL LOW (ref 8.4–10.5)
Calcium: 8.1 mg/dL — ABNORMAL LOW (ref 8.4–10.5)
Chloride: 89 mEq/L — ABNORMAL LOW (ref 96–112)
Creatinine, Ser: 0.93 mg/dL (ref 0.50–1.10)
GFR calc Af Amer: 74 mL/min — ABNORMAL LOW (ref >90)
GFR calc Af Amer: 63 mL/min — ABNORMAL LOW (ref >90)
GFR calc non Af Amer: 64 mL/min — ABNORMAL LOW (ref >90)
GFR calc non Af Amer: 54 mL/min — ABNORMAL LOW (ref >90)
Glucose, Bld: 127 mg/dL — ABNORMAL HIGH (ref 70–99)
Potassium: 3 mEq/L — ABNORMAL LOW (ref 3.5–5.1)
Sodium: 126 mEq/L — ABNORMAL LOW (ref 135–145)

## 2012-05-12 LAB — CBC
MCHC: 36.1 g/dL — ABNORMAL HIGH (ref 30.0–36.0)
Platelets: 69 10*3/uL — ABNORMAL LOW (ref 150–400)
RDW: 17.5 % — ABNORMAL HIGH (ref 11.5–15.5)
WBC: 0.7 10*3/uL — CL (ref 4.0–10.5)

## 2012-05-12 LAB — VITAMIN B12: Vitamin B-12: 1711 pg/mL — ABNORMAL HIGH (ref 211–911)

## 2012-05-12 LAB — FOLATE: Folate: 5.7 ng/mL

## 2012-05-12 LAB — URINE CULTURE

## 2012-05-12 LAB — IRON AND TIBC: Saturation Ratios: 39 % (ref 20–55)

## 2012-05-12 MED ORDER — POTASSIUM CHLORIDE 10 MEQ/50ML IV SOLN
10.0000 meq | INTRAVENOUS | Status: AC
  Administered 2012-05-12 (×6): 10 meq via INTRAVENOUS
  Filled 2012-05-12 (×6): qty 50

## 2012-05-12 MED ORDER — VITAMINS A & D EX OINT
TOPICAL_OINTMENT | CUTANEOUS | Status: AC
  Administered 2012-05-12: 5
  Filled 2012-05-12: qty 5

## 2012-05-12 MED ORDER — PROMETHAZINE HCL 25 MG/ML IJ SOLN
12.5000 mg | INTRAMUSCULAR | Status: DC | PRN
  Administered 2012-05-12: 12.5 mg via INTRAVENOUS
  Filled 2012-05-12: qty 1

## 2012-05-12 MED ORDER — MAGNESIUM SULFATE 40 MG/ML IJ SOLN
4.0000 g | Freq: Once | INTRAMUSCULAR | Status: AC
  Administered 2012-05-12: 4 g via INTRAVENOUS
  Filled 2012-05-12: qty 100

## 2012-05-12 MED ORDER — SODIUM CHLORIDE 0.9 % IV BOLUS (SEPSIS)
500.0000 mL | Freq: Once | INTRAVENOUS | Status: AC
  Administered 2012-05-12: 500 mL via INTRAVENOUS

## 2012-05-12 NOTE — Progress Notes (Signed)
Patient ID: Maria Mckinney, female   DOB: 1951/01/27, 62 y.o.   MRN: [id number]  [facility] PROGRESS NOTE  TAMMEE THIELKE WUJ:[id number] DOB: 1950-10-07 DOA: 05/10/2012 PCP: [name], MD  Brief narrative:  Patient is a 62 y.o. female with a PMHx of breast cancer currently undergoing chemotherapy, who presents to [facility] for evaluation of fever, chills and nausea. Patient was apparently doing well until one day prior to admission when she went for her chemotherapy session. Patient completed her chemotherapy session without any problems. She started feeling fever which was noted to be 100.5 at home, associated with chills. Patient also complained of nausea which started one day before admission, a/w weakness all over on the day of admission. She also has not eaten anything since morning due to persistent nausea. Patient also complains of difficulty urination although she denies abdominal pain.  Patient evaluation in the emergency room was suggestive of fevers of 102.5 F, severely neutropenic and urine analysis consistent with infection.   Principal Problem:  *Urinary tract infection  - no fevers noted since admission  - pt somewhat somnolent this AM after Phenergan  - will continue current broad spectrum ABX until final blood culture and urine cultures back  - continue supportive care with IVF, analgesia and antiemetics as needed  Active Problems:  Cancer of upper-outer quadrant of female breast  - will notify primary oncologist of pt's admission  Neutropenia  - likely sequela of chemotherapy in the setting of UTI, numbers trending down - continue broad spectrum ABX, Vancomycin and Zosyn - follow up on urine and blood culture  - CBC with diff in AM  GERD (gastroesophageal reflux disease)  - stable  Hypertension  - stable, monitor on telemetry floor  Pancytopenia  - likely sequela of chemotherapy  - treatment with ABX as noted above  - hold heparin products and place SCD's, obtained  FOBT and anemia panel  - CBC in AM  Hyponatremia  - secondary to dehydration  - provide additional NS bolus today - IVF and BMP in AM  Electrolytes disturbance  - will continue to supplement potassium and magnesium  - BMP in AM and also Mg in AM  Consultants:  None Procedures/Studies:  Dg Chest Port 1 View  05/10/2012  No active disease. No significant change.  Antibiotics:  Vancomycin 01/09 -->  Zosyn 01/09 --> Code Status: Full  Family Communication: Pt at bedside  Disposition Plan: Home when medically stable   HPI/Subjective: No events overnight.   Objective: Filed Vitals:   05/11/12 1600 05/11/12 1805 05/11/12 2200 05/12/12 0644  BP: 141/69 138/79 159/84 158/89  Pulse: 111 114 118 123  Temp: 98 F (36.7 C) 98.3 F (36.8 C) 97.7 F (36.5 C) 98.3 F (36.8 C)  TempSrc: Oral Oral Oral Oral  Resp: 14  16 16   Height:      Weight:      SpO2: 99% 98% 98% 97%    Intake/Output Summary (Last 24 hours) at 05/12/12 1244 Last data filed at 05/11/12 1914  Gross per 24 hour  Intake 1216.67 ml  Output    100 ml  Net 1116.67 ml    Exam:   General:  Pt is somnolent, follows commands appropriately when awake, not in acute distress  Cardiovascular: Regular rhythm, tachycardic, S1/S2, no murmurs, no rubs, no gallops  Respiratory: Clear to auscultation bilaterally, decreased breath sounds at bases   Abdomen: Soft, non tender, non distended, bowel sounds present, no guarding  Extremities: No edema, pulses  DP and PT palpable bilaterally  Data Reviewed: Basic Metabolic Panel:  Lab 05/12/12 1914 05/11/12 1920 05/11/12 1225 05/10/12 2359 05/10/12 1909  NA 124* 124* 126* 125* 125*  K 2.8* 2.8* 2.5* 2.2* 2.4*  CL 89* 88* 90* 87* 85*  CO2 21 22 24 24 28   GLUCOSE 159* 138* 113* 99 110*  BUN 9 7 7 10 12   CREATININE 0.93 0.58 0.61 0.75 0.84  CALCIUM 8.2* 7.9* 7.6* 7.5* 7.9*  MG -- -- -- 0.9* --  PHOS -- -- -- -- --   Liver Function Tests:  Lab 05/10/12 1909 05/09/12  0939  AST 30 78*  ALT 69* 137*  ALKPHOS 59 85  BILITOT 3.9* 2.42*  PROT 5.9* 6.2*  ALBUMIN 2.8* 3.9   CBC:  Lab 05/12/12 0420 05/11/12 1225 05/10/12 1909 05/09/12 0939  WBC 0.7* 0.9* 0.7* 1.0*  NEUTROABS -- -- -- 0.3*  HGB 8.8* 8.4* 9.1* 11.8  HCT 24.4* 23.6* 25.1* 33.1*  MCV 85.9 86.1 86.0 86.2  PLT 69* 66* 67* 127*    Recent Results (from the past 240 hour(s))  URINE CULTURE     Status: Normal (Preliminary result)   Collection Time   05/10/12  8:11 PM      Component Value Range Status Comment   Specimen Description URINE, CLEAN CATCH   Final    Special Requests NONE   Final    Culture  Setup Time 05/11/2012 02:42   Final    Colony Count 50,000 COLONIES/ML   Final    Culture GRAM NEGATIVE RODS   Final    Report Status PENDING   Incomplete   MRSA PCR SCREENING     Status: Normal   Collection Time   05/11/12  1:22 AM      Component Value Range Status Comment   MRSA by PCR NEGATIVE  NEGATIVE Final      Scheduled Meds:   . DULoxetine  60 mg Oral Daily  . levothyroxine  100 mcg Oral Daily  . magnesium sulfate 1 - 4 g bolus IVPB  4 g Intravenous Once  . pantoprazole  80 mg Oral Q1200  . piperacillin-tazobactam (ZOSYN)  IV  3.375 g Intravenous Q8H  . potassium chloride  10 mEq Intravenous Q1 Hr x 6  . sodium chloride  3 mL Intravenous Q12H  . vancomycin  1,000 mg Intravenous Q12H  . vitamin A & D       Continuous Infusions:   . sodium chloride 75 mL/hr at 05/11/12 1914     Debbora Presto, MD  [facility] Pager [phone]  If 7PM-7AM, please contact night-coverage [email or web address] Password [email or web address] 05/12/2012, 12:44 PM   LOS: 2 days

## 2012-05-12 NOTE — Progress Notes (Signed)
ANTIBIOTIC CONSULT NOTE - FOLLOW UP  Pharmacy Consult for vancomycin Indication: neutropenia  Labs:  Basename 05/12/12 1441 05/12/12 0420 05/11/12 1920 05/11/12 1225 05/10/12 1909  WBC -- 0.7* -- 0.9* 0.7*  HGB -- 8.8* -- 8.4* 9.1*  PLT -- 69* -- 66* 67*  LABCREA -- -- -- -- --  CREATININE 1.07 0.93 0.58 -- --   Estimated Creatinine Clearance: 53.7 ml/min (by C-G formula based on Cr of 1.07).  Basename 05/12/12 1950  VANCOTROUGH 13.8  VANCOPEAK --  VANCORANDOM --  GENTTROUGH --  GENTPEAK --  GENTRANDOM --  TOBRATROUGH --  TOBRAPEAK --  TOBRARND --  AMIKACINPEAK --  AMIKACINTROU --  AMIKACIN --    A/P:   Vanc trough slightly below goal of 15-20 but given that SCr bumped can expect some accumulation  Continue 1g IV q12  Hessie Knows, PharmD, BCPS Pager [phone] 05/12/2012 8:53 PM

## 2012-05-12 NOTE — Progress Notes (Signed)
ANTIBIOTIC CONSULT NOTE - FOLLOW UP  Pharmacy Consult for Vancomycin, Zosyn Indication: neutropenia, UTI  Allergies  Allergen Reactions  . Acyclovir And Related   . Ciprofloxacin Hcl Rash  . Bactrim (Sulfamethoxazole W-Trimethoprim)    Patient Measurements: Height: 5\' 2"  (157.5 cm) Weight: 182 lb 8.7 oz (82.8 kg) IBW/kg (Calculated) : 50.1   Vital Signs: Temp: 98.3 F (36.8 C) (01/11 0644) Temp src: Oral (01/11 0644) BP: 158/89 mmHg (01/11 0644) Pulse Rate: 123  (01/11 0644) Intake/Output from previous day: 01/10 0701 - 01/11 0700 In: 2266.7 [P.O.:180; I.V.:1524.2; IV Piggyback:562.5] Out: 400 [Urine:400]  Labs:  [facility] 05/12/12 0420 05/11/12 1920 05/11/12 1225 05/10/12 1909  WBC 0.7* -- 0.9* 0.7*  HGB 8.8* -- 8.4* 9.1*  PLT 69* -- 66* 67*  LABCREA -- -- -- --  CREATININE 0.93 0.58 0.61 --   Estimated Creatinine Clearance: 61.8 ml/min (by C-G formula based on Cr of 0.93). No results found for this basename: VANCOTROUGH:2,VANCOPEAK:2,VANCORANDOM:2,GENTTROUGH:2,GENTPEAK:2,GENTRANDOM:2,TOBRATROUGH:2,TOBRAPEAK:2,TOBRARND:2,AMIKACINPEAK:2,AMIKACINTROU:2,AMIKACIN:2, in the last 72 hours   Microbiology: Recent Results (from the past 720 hour(s))  URINE CULTURE     Status: Normal (Preliminary result)   Collection Time   05/10/12  8:11 PM      Component Value Range Status Comment   Specimen Description URINE, CLEAN CATCH   Final    Special Requests NONE   Final    Culture  Setup Time 05/11/2012 02:42   Final    Colony Count 50,000 COLONIES/ML   Final    Culture GRAM NEGATIVE RODS   Final    Report Status PENDING   Incomplete   MRSA PCR SCREENING     Status: Normal   Collection Time   05/11/12  1:22 AM      Component Value Range Status Comment   MRSA by PCR NEGATIVE  NEGATIVE Final     Anti-infectives     Start     Dose/Rate Route Frequency Ordered Stop   05/11/12 0800   vancomycin (VANCOCIN) IVPB 1000 mg/200 mL premix        1,000 mg 200 mL/hr over 60 Minutes  Intravenous Every 12 hours 05/11/12 0023     05/11/12 0100  piperacillin-tazobactam (ZOSYN) IVPB 3.375 g       3.375 g 12.5 mL/hr over 240 Minutes Intravenous 3 times per day 05/11/12 0023     05/10/12 1830  piperacillin-tazobactam (ZOSYN) IVPB 3.375 g       3.375 g 100 mL/hr over 30 Minutes Intravenous  Once 05/10/12 1811 05/10/12 2020   05/10/12 1830   vancomycin (VANCOCIN) IVPB 1000 mg/200 mL premix        1,000 mg 200 mL/hr over 60 Minutes Intravenous  Once 05/10/12 1811 05/10/12 2135   05/10/12 1830   oseltamivir (TAMIFLU) capsule 75 mg        75 mg Oral  Once 05/10/12 1811 05/10/12 1907          Assessment:  61 YOF admit 1/9 with neutropenia, fever, chills.  S/p Chemo 05/03/12, herceptin weekly.  Concern for UTI.  Day #3 Vancomycin, Zosyn  Urine culture with GNR, pending identification.  Remains neutropenic, WBC decreased to 0.7  SCr increased slightly, CrCl ~ 62 ml/min  Goal of Therapy:  Vancomycin trough level 15-20 mcg/ml Appropriate abx dosing, eradication of infection.  Plan:   Continue Zosyn 3.375g IV Q8H infused over 4hrs.  Continue Vancomycin 1g IV q12h.  Measure Vanc trough at steady state.  Follow up renal fxn and culture results.   Lynann Beaver PharmD,  BCPS Pager [phone] 05/12/2012 8:17 AM

## 2012-05-13 LAB — BASIC METABOLIC PANEL
CO2: 22 mEq/L (ref 19–32)
Calcium: 8.3 mg/dL — ABNORMAL LOW (ref 8.4–10.5)
Glucose, Bld: 110 mg/dL — ABNORMAL HIGH (ref 70–99)
Sodium: 129 mEq/L — ABNORMAL LOW (ref 135–145)

## 2012-05-13 LAB — CBC
Hemoglobin: 7.5 g/dL — ABNORMAL LOW (ref 12.0–15.0)
MCH: 31.1 pg (ref 26.0–34.0)
MCV: 85.9 fL (ref 78.0–100.0)
Platelets: 39 10*3/uL — ABNORMAL LOW (ref 150–400)
RBC: 2.41 MIL/uL — ABNORMAL LOW (ref 3.87–5.11)

## 2012-05-13 MED ORDER — POTASSIUM CHLORIDE 10 MEQ/100ML IV SOLN
10.0000 meq | INTRAVENOUS | Status: AC
  Administered 2012-05-13 (×5): 10 meq via INTRAVENOUS
  Filled 2012-05-13 (×5): qty 100

## 2012-05-13 MED ORDER — MAGIC MOUTHWASH
10.0000 mL | Freq: Every day | ORAL | Status: DC | PRN
  Administered 2012-05-13: 5 mL via ORAL
  Filled 2012-05-13: qty 10

## 2012-05-13 MED ORDER — POTASSIUM CHLORIDE 10 MEQ/100ML IV SOLN
10.0000 meq | INTRAVENOUS | Status: AC
  Administered 2012-05-13 (×5): 10 meq via INTRAVENOUS
  Filled 2012-05-13 (×6): qty 100

## 2012-05-13 NOTE — Progress Notes (Signed)
Patient ID: Maria Mckinney, female   DOB: May 25, 1950, 62 y.o.   MRN: [id number]  [facility] PROGRESS NOTE  Maria Mckinney:[id number] DOB: 25-May-1950 DOA: 05/10/2012 PCP: [name], MD  Brief narrative:  Patient is a 62 y.o. female with a PMHx of breast cancer currently undergoing chemotherapy, who presents to [facility] for evaluation of fever, chills and nausea. Patient was apparently doing well until one day prior to admission when she went for her chemotherapy session. Patient completed her chemotherapy session without any problems. She started feeling fever which was noted to be 100.5 at home, associated with chills. Patient also complained of nausea which started one day before admission, a/w weakness all over on the day of admission. She also has not eaten anything since morning due to persistent nausea. Patient also complains of difficulty urination although she denies abdominal pain.  Patient evaluation in the emergency room was suggestive of fevers of 102.5 F, severely neutropenic and urine analysis consistent with infection.   Principal Problem:  *Urinary tract infection  - no fevers noted since admission  - pt clinically improving  - will continue current broad spectrum ABX until final blood culture and urine cultures back  - continue supportive care with IVF, analgesia and antiemetics as needed  Active Problems:  Cancer of upper-outer quadrant of female breast  - will notify primary oncologist of pt's admission  Neutropenia  - likely sequela of chemotherapy in the setting of UTI, numbers trending down  - continue broad spectrum ABX, Vancomycin and Zosyn  - urine and blood culture final reports still pending  - CBC with diff in AM  GERD (gastroesophageal reflux disease)  - stable  Hypertension  - stable, monitor on telemetry floor  Pancytopenia  - likely sequela of chemotherapy, counts trending down - treatment with ABX as noted above  - hold heparin products and place  SCD's - plan on transfusion of PRBC if Hg < 7.5 - CBC in AM  Hyponatremia  - secondary to dehydration, trending up  - provide additional NS bolus today  - IVF and BMP in AM  Electrolytes disturbance  - will continue to supplement potassium and magnesium  - BMP in AM and also Mg in AM   Consultants:  None Procedures/Studies:  Dg Chest Port 1 View  05/10/2012  No active disease. No significant change.  Antibiotics:  Vancomycin 01/09 -->  Zosyn 01/09 --> Code Status: Full  Family Communication: Pt at bedside  Disposition Plan: Home when medically stable   HPI/Subjective: No events overnight.   Objective: Filed Vitals:   05/12/12 1421 05/12/12 2252 05/13/12 0618 05/13/12 1235  BP: 171/79 145/83 152/80 155/78  Pulse: 111 111 107 117  Temp: 98.8 F (37.1 C) 98.6 F (37 C) 98.1 F (36.7 C) 97.9 F (36.6 C)  TempSrc: Axillary Axillary Axillary Oral  Resp: 16 16 18 18   Height:      Weight:      SpO2: 100% 96% 100% 97%    Intake/Output Summary (Last 24 hours) at 05/13/12 1714 Last data filed at 05/13/12 1234  Gross per 24 hour  Intake   1385 ml  Output   1600 ml  Net   -215 ml    Exam:   General:  Pt is alert, follows commands appropriately, not in acute distress  Cardiovascular: Regular rhythm, tachycardic, S1/S2, no murmurs, no rubs, no gallops  Respiratory: Clear to auscultation bilaterally, no wheezing, no crackles, no rhonchi  Abdomen: Soft, non tender, non distended,  bowel sounds present, no guarding  Extremities: No edema, pulses DP and PT palpable bilaterally  Neuro: Grossly nonfocal  Data Reviewed: Basic Metabolic Panel:  Lab 05/13/12 4098 05/12/12 1441 05/12/12 0420 05/11/12 1920 05/11/12 1225 05/10/12 2359  NA 129* 126* 124* 124* 126* --  K 2.3* 3.0* 2.8* 2.8* 2.5* --  CL 95* 92* 89* 88* 90* --  CO2 22 21 21 22 24  --  GLUCOSE 110* 127* 159* 138* 113* --  BUN 10 9 9 7 7  --  CREATININE 1.00 1.07 0.93 0.58 0.61 --  CALCIUM 8.3* 8.1* 8.2* 7.9*  7.6* --  MG 2.1 -- -- -- -- 0.9*  PHOS -- -- -- -- -- --   Liver Function Tests:  Lab 05/10/12 1909 05/09/12 0939  AST 30 78*  ALT 69* 137*  ALKPHOS 59 85  BILITOT 3.9* 2.42*  PROT 5.9* 6.2*  ALBUMIN 2.8* 3.9   CBC:  Lab 05/13/12 0400 05/12/12 0420 05/11/12 1225 05/10/12 1909 05/09/12 0939  WBC 0.7* 0.7* 0.9* 0.7* 1.0*  NEUTROABS -- -- -- -- 0.3*  HGB 7.5* 8.8* 8.4* 9.1* 11.8  HCT 20.7* 24.4* 23.6* 25.1* 33.1*  MCV 85.9 85.9 86.1 86.0 86.2  PLT 39* 69* 66* 67* 127*    Recent Results (from the past 240 hour(s))  CULTURE, BLOOD (ROUTINE X 2)     Status: Normal (Preliminary result)   Collection Time   05/10/12  7:00 PM      Component Value Range Status Comment   Specimen Description BLOOD PORT   Final    Special Requests BOTTLES DRAWN AEROBIC AND ANAEROBIC 5CC EACH   Final    Culture  Setup Time 05/11/2012 02:16   Final    Culture     Final    Value:        BLOOD CULTURE RECEIVED NO GROWTH TO DATE CULTURE WILL BE HELD FOR 5 DAYS BEFORE ISSUING A FINAL NEGATIVE REPORT   Report Status PENDING   Incomplete   CULTURE, BLOOD (ROUTINE X 2)     Status: Normal (Preliminary result)   Collection Time   05/10/12  7:15 PM      Component Value Range Status Comment   Specimen Description BLOOD RIGHT ARM   Final    Special Requests BOTTLES DRAWN AEROBIC AND ANAEROBIC 5CC EACH   Final    Culture  Setup Time 05/11/2012 02:16   Final    Culture     Final    Value:        BLOOD CULTURE RECEIVED NO GROWTH TO DATE CULTURE WILL BE HELD FOR 5 DAYS BEFORE ISSUING A FINAL NEGATIVE REPORT   Report Status PENDING   Incomplete   URINE CULTURE     Status: Normal   Collection Time   05/10/12  8:11 PM      Component Value Range Status Comment   Specimen Description URINE, CLEAN CATCH   Final    Special Requests NONE   Final    Culture  Setup Time 05/11/2012 02:42   Final    Colony Count 50,000 COLONIES/ML   Final    Culture ESCHERICHIA COLI   Final    Report Status 05/12/2012 FINAL   Final     Organism ID, Bacteria ESCHERICHIA COLI   Final   MRSA PCR SCREENING     Status: Normal   Collection Time   05/11/12  1:22 AM      Component Value Range Status Comment   MRSA by PCR NEGATIVE  NEGATIVE Final      Scheduled Meds:   . DULoxetine  60 mg Oral Daily  . levothyroxine  100 mcg Oral Daily  . pantoprazole  80 mg Oral Q1200  . piperacillin-tazobactam (ZOSYN)  IV  3.375 g Intravenous Q8H  . sodium chloride  3 mL Intravenous Q12H  . vancomycin  1,000 mg Intravenous Q12H   Continuous Infusions:   . sodium chloride 75 mL/hr at 05/13/12 1313     Debbora Presto, MD  [facility] Pager [phone]  If 7PM-7AM, please contact night-coverage [email or web address] Password [email or web address] 05/13/2012, 5:14 PM   LOS: 3 days

## 2012-05-13 NOTE — Plan of Care (Signed)
Problem: Phase I Progression Outcomes Goal: Pain controlled with appropriate interventions Outcome: Progressing No complaints of pain Goal: OOB as tolerated unless otherwise ordered Outcome: Progressing Tolerates fine - oob with standby assist to bathroom

## 2012-05-14 ENCOUNTER — Other Ambulatory Visit: Payer: Self-pay | Admitting: Oncology

## 2012-05-14 ENCOUNTER — Encounter (HOSPITAL_COMMUNITY): Payer: Self-pay | Admitting: *Deleted

## 2012-05-14 ENCOUNTER — Other Ambulatory Visit: Payer: Self-pay | Admitting: Adult Health

## 2012-05-14 DIAGNOSIS — A498 Other bacterial infections of unspecified site: Secondary | ICD-10-CM

## 2012-05-14 DIAGNOSIS — N390 Urinary tract infection, site not specified: Secondary | ICD-10-CM

## 2012-05-14 DIAGNOSIS — D649 Anemia, unspecified: Secondary | ICD-10-CM

## 2012-05-14 LAB — BASIC METABOLIC PANEL
BUN: 11 mg/dL (ref 6–23)
BUN: 11 mg/dL (ref 6–23)
CO2: 21 mEq/L (ref 19–32)
Calcium: 8 mg/dL — ABNORMAL LOW (ref 8.4–10.5)
Chloride: 96 mEq/L (ref 96–112)
Creatinine, Ser: 1.07 mg/dL (ref 0.50–1.10)
GFR calc non Af Amer: 50 mL/min — ABNORMAL LOW (ref >90)
Glucose, Bld: 87 mg/dL (ref 70–99)
Glucose, Bld: 78 mg/dL (ref 70–99)

## 2012-05-14 LAB — CBC
HCT: 18.8 % — ABNORMAL LOW (ref 36.0–46.0)
Hemoglobin: 6.7 g/dL — CL (ref 12.0–15.0)
MCH: 30.7 pg (ref 26.0–34.0)
MCHC: 35.6 g/dL (ref 30.0–36.0)

## 2012-05-14 LAB — HEMOGLOBIN AND HEMATOCRIT, BLOOD
HCT: 23.5 % — ABNORMAL LOW (ref 36.0–46.0)
Hemoglobin: 8.6 g/dL — ABNORMAL LOW (ref 12.0–15.0)

## 2012-05-14 MED ORDER — SODIUM CHLORIDE 0.9 % IV BOLUS (SEPSIS): 500.0000 mL | Freq: Once | INTRAVENOUS | Status: DC

## 2012-05-14 MED ORDER — POTASSIUM CHLORIDE 10 MEQ/100ML IV SOLN
10.0000 meq | INTRAVENOUS | Status: AC
  Administered 2012-05-14: 10 meq via INTRAVENOUS
  Filled 2012-05-14 (×6): qty 100

## 2012-05-14 MED ORDER — POTASSIUM CHLORIDE 10 MEQ/100ML IV SOLN
10.0000 meq | INTRAVENOUS | Status: AC
  Administered 2012-05-14 (×5): 10 meq via INTRAVENOUS
  Filled 2012-05-14 (×5): qty 100

## 2012-05-14 MED ORDER — PROMETHAZINE HCL 25 MG/ML IJ SOLN
12.5000 mg | Freq: Three times a day (TID) | INTRAMUSCULAR | Status: DC | PRN
Start: 2012-05-14 — End: 2012-05-15

## 2012-05-14 NOTE — Progress Notes (Signed)
NP called and stated to get H&H and will reevaluate if pt needs 2nd unit of blood.

## 2012-05-14 NOTE — Consult Note (Signed)
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  She was admitted on 05/10/12 after one day of fatigue, mentation changes, and a fever.  She had febrile neutropenia.  Since her admission she's been on Zosyn and Vanc, and has been tolerating the antibiotics well.  She also has been anemic require PRBCs, and hypokalemic requiring high amounts of potassium supplementation.  She's having 5-6 loose BM's per day, denies abd pain/cramping, she's had no further fevers, and is very fatigued.  She isn't getting out of bed a lot, and feels weak.  She is only drinking water.  She has been very nauseated and doesn't want to eat.  Otherwise, she's been doing well, and without questions/concerns.    Objective:  Filed Vitals:   05/14/12 1634  BP: 152/83  Pulse: 105  Temp: 98.7 F (37.1 C)  Resp: 18    Body mass index is 33.39 kg/(m^2).  Intake/Output Summary (Last 24 hours) at 05/14/12 1638 Last data filed at 05/14/12 1602  Gross per 24 hour  Intake 1971.25 ml  Output    900 ml  Net 1071.25 ml   General: Patient is a well appearing female in no acute distress HEENT: PERRLA, sclerae anicteric no conjunctival pallor, MMM, ulceration on tongue Neck: supple, no palpable adenopathy Lungs: clear to auscultation bilaterally, no wheezes, rhonchi, or rales Cardiovascular: regular rate rhythm, S1, S2, no murmurs, rubs or gallops Abdomen: Soft, non-tender, non-distended, normoactive bowel sounds, no HSM Extremities: warm and well perfused, no clubbing, cyanosis, or edema Skin: No rashes or lesions Neuro: Non-focal   CBG (last 3)  No results found for this basename: GLUCAP:3 in the last 72 hours   Labs:  Lab Results  Component Value Date   WBC 0.9* 05/14/2012   HGB 6.7* 05/14/2012   HCT 18.8*  05/14/2012   MCV 86.2 05/14/2012   PLT 32* 05/14/2012   NEUTROABS 0.3* 05/09/2012    Urine Studies No results found for this basename: UACOL:2,UAPR:2,USPG:2,UPH:2,UTP:2,UGL:2,UKET:2,UBIL:2,UHGB:2,UNIT:2,UROB:2,ULEU:2,UEPI:2,UWBC:2,URBC:2,UBAC:2,CAST:2,CRYS:2,UCOM:2,BILUA:2 in the last 72 hours  Basic Metabolic Panel:  Lab 05/14/12 6213 05/13/12 0400 05/12/12 1441 05/12/12 0420 05/11/12 1920 05/10/12 2359  NA 126* 129* 126* 124* 124* --  K 2.4* 2.3* -- -- -- --  CL 94* 95* 92* 89* 88* --  CO2 21 22 21 21 22  --  GLUCOSE 87 110* 127* 159* 138* --  BUN 11 10 9 9 7  --  CREATININE 1.14* 1.00 1.07 0.93 0.58 --  CALCIUM 8.0* 8.3* 8.1* 8.2* 7.9* --  MG -- 2.1 -- -- -- 0.9*  PHOS -- -- -- -- -- --   GFR Estimated Creatinine Clearance: 50.4 ml/min (by C-G formula based on Cr of 1.14). Liver Function Tests:  Lab 05/10/12 1909 05/09/12 0939  AST 30 78*  ALT 69* 137*  ALKPHOS 59 85  BILITOT 3.9* 2.42*  PROT 5.9* 6.2*  ALBUMIN 2.8* 3.9   No results found for this basename: LIPASE:5,AMYLASE:5 in the last 168 hours No results found for this basename: AMMONIA:5 in the last 168 hours Coagulation profile No results found for this basename: INR:5,PROTIME:5 in the last 168 hours  CBC:  Lab 05/14/12 0450 05/13/12 0400 05/12/12 0420 05/11/12 1225 05/10/12 1909  WBC 0.9* 0.7* 0.7* 0.9* 0.7*  NEUTROABS -- -- -- -- --  HGB 6.7* 7.5* 8.8* 8.4* 9.1*  HCT 18.8* 20.7* 24.4* 23.6* 25.1*  MCV 86.2 85.9 85.9 86.1 86.0  PLT 32* 39* 69* 66* 67*   Cardiac Enzymes: No results found for this basename: CKTOTAL:5,CKMB:5,CKMBINDEX:5,TROPONINI:5 in the last 168 hours BNP: No components found with this basename: POCBNP:5 CBG: No results found for this basename: GLUCAP:5 in the last 168 hours D-Dimer No results found for this basename: DDIMER:2 in the last 72 hours Hgb A1c No results found for this basename: HGBA1C:2 in the last 72 hours Lipid Profile No results found for this basename:  CHOL:2,HDL:2,LDLCALC:2,TRIG:2,CHOLHDL:2,LDLDIRECT:2 in the last 72 hours Thyroid function studies No results found for this basename: TSH,T4TOTAL,FREET3,T3FREE,THYROIDAB in the last 72 hours Anemia work up  [employer] 05/11/12 1920  VITAMINB12 1711*  FOLATE 5.7  FERRITIN 1754*  TIBC 171*  IRON 66  RETICCTPCT 0.4   Microbiology Recent Results (from the past 240 hour(s))  CULTURE, BLOOD (ROUTINE X 2)     Status: Normal (Preliminary result)   Collection Time   05/10/12  7:00 PM      Component Value Range Status Comment   Specimen Description BLOOD PORT   Final    Special Requests BOTTLES DRAWN AEROBIC AND ANAEROBIC 5CC EACH   Final    Culture  Setup Time 05/11/2012 02:16   Final    Culture     Final    Value:        BLOOD CULTURE RECEIVED NO GROWTH TO DATE CULTURE WILL BE HELD FOR 5 DAYS BEFORE ISSUING A FINAL NEGATIVE REPORT   Report Status PENDING   Incomplete   CULTURE, BLOOD (ROUTINE X 2)     Status: Normal (Preliminary result)   Collection Time   05/10/12  7:15 PM      Component Value Range Status Comment   Specimen Description BLOOD RIGHT ARM   Final    Special Requests BOTTLES DRAWN AEROBIC AND ANAEROBIC 5CC EACH   Final    Culture  Setup Time 05/11/2012 02:16   Final    Culture     Final    Value:        BLOOD CULTURE RECEIVED NO GROWTH TO DATE CULTURE WILL BE HELD FOR 5 DAYS BEFORE ISSUING A FINAL NEGATIVE REPORT   Report Status PENDING   Incomplete   URINE CULTURE     Status: Normal   Collection Time   05/10/12  8:11 PM      Component Value Range Status Comment   Specimen Description URINE, CLEAN CATCH   Final    Special Requests NONE   Final    Culture  Setup Time 05/11/2012 02:42   Final    Colony Count 50,000 COLONIES/ML   Final    Culture ESCHERICHIA COLI   Final    Report Status 05/12/2012 FINAL   Final    Organism ID, Bacteria ESCHERICHIA COLI   Final   MRSA PCR SCREENING     Status: Normal   Collection Time   05/11/12  1:22 AM      Component Value Range Status  Comment   MRSA by PCR NEGATIVE  NEGATIVE Final       Studies:  No results found.  Assessment: 62 y.o. with stage 1 Her-2 + left breast cancer.   1. Breast cancer  2. Febrile Neutropenia  3. Anemia  4. FEN  5. Weakness/Debilitation    Plan:  1. Pt has completed her neoadjuvant chemotherapy.  She needs to have a diagnostic mammogram and ultrasound to evaluate her response.  This will be  arranged at f/u.    2. Pt has grown out Ecoli in her urine.  Continue Zosyn IV.  Consider discontinuing Vanc now that infection source has been located and patient has been afebrile for 3 days.  Blood cultures continue to be negative.  Prolonged Neutropenia likely the effect of cumulative myelosuppressive chemotherapy.  White count should begin to recover soon.    3. Anemia.  Continue to transfuse as indicated.  As above, should recover soon.     4. FEN: Continue IV hydration, would increase rate if patient able to tolerate it, and supplementing potassium and magnesium.  Encouraged PO intake.    5. Pt very weak, feels like she's losing strength.  Consider PT consult to regain strength before discharge.    6. FULL CODE   Lindsey C. Lyn Hollingshead, NP Medical Oncology [facility] Phone: [phone]  Augustin Schooling 05/14/2012  ATTENDING'S ATTESTATION:  I personally reviewed patient's chart, examined patient myself, formulated the treatment plan as followed.   I agree with Lindseys assessment and plan.  Drue Second, MD Medical/Oncology [facility] [phone] (beeper) [phone] (Office)  05/14/2012, 9:52 PM

## 2012-05-14 NOTE — Progress Notes (Signed)
Report called to [place] on 3rd floor

## 2012-05-14 NOTE — Progress Notes (Signed)
Patient ID: Maria Mckinney, female   DOB: 30-Jul-1950, 62 y.o.   MRN: [id number]  [facility] PROGRESS NOTE  Maria Mckinney WUJ:[id number] DOB: 07/14/1950 DOA: 05/10/2012 PCP: [name], MD  Brief narrative:  Patient is a 62 y.o. female with a PMHx of breast cancer currently undergoing chemotherapy, who presents to [facility] for evaluation of fever, chills and nausea. Patient was apparently doing well until one day prior to admission when she went for her chemotherapy session. Patient completed her chemotherapy session without any problems. She started feeling fever which was noted to be 100.5 at home, associated with chills. Patient also complained of nausea which started one day before admission, a/w weakness all over on the day of admission. She also has not eaten anything since morning due to persistent nausea. Patient also complains of difficulty urination although she denies abdominal pain.  Patient evaluation in the emergency room was suggestive of fevers of 102.5 F, severely neutropenic and urine analysis consistent with infection.   Principal Problem:  *Urinary tract infection  - no fevers noted since admission  - pt clinically improving but slightly somnolent this morning, she is able to report no pain, no chest pain, no shortness of - will continue current broad spectrum ABX until final blood culture and urine cultures back  - continue supportive care with IVF, analgesia and antiemetics as needed  - As sodium level is still low will provide additional normal saline bolus today Active Problems:  Cancer of upper-outer quadrant of female breast  - will ask primary oncologist Dr. Welton Flakes for further assistance and recommendations in terms of management  Neutropenia  - likely sequela of chemotherapy in the setting of UTI, numbers continue trending down  - continue broad spectrum ABX, Vancomycin and Zosyn  - urine and blood culture final reports still pending  - CBC with diff in AM  GERD  (gastroesophageal reflux disease)  - stable  Hypertension  - stable, d/c telemetry as no events noted over 72 hour period  Pancytopenia  - likely sequela of chemotherapy, counts trending down  - treatment with ABX as noted above  - hold heparin products and place SCD's  - Transfuse 2 units of PRBC today - CBC in AM Acute renal failure - Likely of prerenal UTI - Will provide additional bolus of normal saline as noted above and will continue IV fluids at 75 cc per hour - BMP in AM Hyponatremia  - secondary to dehydration, slightly down from yesterday - provide additional NS bolus today  - IVF to continue at 75 cc per hour and BMP in AM  Electrolytes disturbance  - will continue to supplement potassium and magnesium  - BMP in AM and also Mg in AM   Consultants:  Oncology Procedures/Studies:  Dg Chest Port 1 View  05/10/2012  No active disease. No significant change.  Antibiotics:  Vancomycin 01/09 -->  Zosyn 01/09 -->  Code Status: Full  Family Communication: Pt and husband at bedside  Disposition Plan: Plan transferring to cancer unit  HPI/Subjective: No events overnight.   Objective: Filed Vitals:   05/14/12 0654 05/14/12 1450 05/14/12 1523 05/14/12 1634  BP: 142/83 154/89 154/86 152/83  Pulse: 104 112 109 105  Temp: 97.7 F (36.5 C) 98.5 F (36.9 C) 98.6 F (37 C) 98.7 F (37.1 C)  TempSrc: Oral Oral Oral Oral  Resp: 18 18 18 18   Height:      Weight:      SpO2: 97%  Intake/Output Summary (Last 24 hours) at 05/14/12 1638 Last data filed at 05/14/12 1602  Gross per 24 hour  Intake 1971.25 ml  Output    900 ml  Net 1071.25 ml    Exam:   General:  Pt is somewhat somnolent this morning, easy to arouse and follows commands appropriately, not in acute distress  Cardiovascular: Regular rhythm, tachycardic, S1/S2, no murmurs, no rubs, no gallops  Respiratory: Clear to auscultation bilaterally, no wheezing, no crackles, no rhonchi  Abdomen: Soft, non  tender, non distended, bowel sounds present, no guarding  Extremities: No edema, pulses DP and PT palpable bilaterally  Neuro: Grossly nonfocal  Data Reviewed: Basic Metabolic Panel:  Lab 05/14/12 0981 05/13/12 0400 05/12/12 1441 05/12/12 0420 05/11/12 1920 05/10/12 2359  NA 126* 129* 126* 124* 124* --  K 2.4* 2.3* 3.0* 2.8* 2.8* --  CL 94* 95* 92* 89* 88* --  CO2 21 22 21 21 22  --  GLUCOSE 87 110* 127* 159* 138* --  BUN 11 10 9 9 7  --  CREATININE 1.14* 1.00 1.07 0.93 0.58 --  CALCIUM 8.0* 8.3* 8.1* 8.2* 7.9* --  MG -- 2.1 -- -- -- 0.9*  PHOS -- -- -- -- -- --   Liver Function Tests:  Lab 05/10/12 1909 05/09/12 0939  AST 30 78*  ALT 69* 137*  ALKPHOS 59 85  BILITOT 3.9* 2.42*  PROT 5.9* 6.2*  ALBUMIN 2.8* 3.9   CBC:  Lab 05/14/12 0450 05/13/12 0400 05/12/12 0420 05/11/12 1225 05/10/12 1909  WBC 0.9* 0.7* 0.7* 0.9* 0.7*  NEUTROABS -- -- -- -- --  HGB 6.7* 7.5* 8.8* 8.4* 9.1*  HCT 18.8* 20.7* 24.4* 23.6* 25.1*  MCV 86.2 85.9 85.9 86.1 86.0  PLT 32* 39* 69* 66* 67*    Recent Results (from the past 240 hour(s))  CULTURE, BLOOD (ROUTINE X 2)     Status: Normal (Preliminary result)   Collection Time   05/10/12  7:00 PM      Component Value Range Status Comment   Specimen Description BLOOD PORT   Final    Special Requests BOTTLES DRAWN AEROBIC AND ANAEROBIC 5CC EACH   Final    Culture  Setup Time 05/11/2012 02:16   Final    Culture     Final    Value:        BLOOD CULTURE RECEIVED NO GROWTH TO DATE CULTURE WILL BE HELD FOR 5 DAYS BEFORE ISSUING A FINAL NEGATIVE REPORT   Report Status PENDING   Incomplete   CULTURE, BLOOD (ROUTINE X 2)     Status: Normal (Preliminary result)   Collection Time   05/10/12  7:15 PM      Component Value Range Status Comment   Specimen Description BLOOD RIGHT ARM   Final    Special Requests BOTTLES DRAWN AEROBIC AND ANAEROBIC 5CC EACH   Final    Culture  Setup Time 05/11/2012 02:16   Final    Culture     Final    Value:        BLOOD  CULTURE RECEIVED NO GROWTH TO DATE CULTURE WILL BE HELD FOR 5 DAYS BEFORE ISSUING A FINAL NEGATIVE REPORT   Report Status PENDING   Incomplete   URINE CULTURE     Status: Normal   Collection Time   05/10/12  8:11 PM      Component Value Range Status Comment   Specimen Description URINE, CLEAN CATCH   Final    Special Requests NONE   Final  Culture  Setup Time 05/11/2012 02:42   Final    Colony Count 50,000 COLONIES/ML   Final    Culture ESCHERICHIA COLI   Final    Report Status 05/12/2012 FINAL   Final    Organism ID, Bacteria ESCHERICHIA COLI   Final   MRSA PCR SCREENING     Status: Normal   Collection Time   05/11/12  1:22 AM      Component Value Range Status Comment   MRSA by PCR NEGATIVE  NEGATIVE Final      Scheduled Meds:   . DULoxetine  60 mg Oral Daily  . levothyroxine  100 mcg Oral Daily  . pantoprazole  80 mg Oral Q1200  . piperacillin-tazobactam (ZOSYN)  IV  3.375 g Intravenous Q8H  . potassium chloride  10 mEq Intravenous Q1 Hr x 6  . sodium chloride  3 mL Intravenous Q12H  . vancomycin  1,000 mg Intravenous Q12H   Continuous Infusions:   . sodium chloride 75 mL/hr at 05/13/12 1313     Debbora Presto, MD  [facility] Pager [phone]  If 7PM-7AM, please contact night-coverage [email or web address] Password [facility] 05/14/2012, 4:38 PM   LOS: 4 days

## 2012-05-14 NOTE — Progress Notes (Signed)
Pt RN in report pt is d/t have 2 units PRBC's. Pt only has order for 1 unit. Pt has already received 1 unit. Called midlevel to make sure pt is due 2nd unit.

## 2012-05-15 ENCOUNTER — Other Ambulatory Visit: Payer: BC Managed Care – PPO | Admitting: Lab

## 2012-05-15 LAB — CBC WITH DIFFERENTIAL/PLATELET
Band Neutrophils: 0 % (ref 0–10)
Basophils Absolute: 0 10*3/uL (ref 0.0–0.1)
Eosinophils Relative: 0 % (ref 0–5)
HCT: 22.7 % — ABNORMAL LOW (ref 36.0–46.0)
Lymphs Abs: 0 10*3/uL — ABNORMAL LOW (ref 0.7–4.0)
MCH: 31.3 pg (ref 26.0–34.0)
MCV: 86.6 fL (ref 78.0–100.0)
Platelets: 23 10*3/uL — CL (ref 150–400)
RBC: 2.62 MIL/uL — ABNORMAL LOW (ref 3.87–5.11)
nRBC: 0 /100 WBC

## 2012-05-15 LAB — BASIC METABOLIC PANEL
BUN: 10 mg/dL (ref 6–23)
CO2: 21 mEq/L (ref 19–32)
Chloride: 95 mEq/L — ABNORMAL LOW (ref 96–112)
Creatinine, Ser: 1.12 mg/dL — ABNORMAL HIGH (ref 0.50–1.10)
GFR calc Af Amer: 59 mL/min — ABNORMAL LOW (ref >90)
Glucose, Bld: 84 mg/dL (ref 70–99)

## 2012-05-15 MED ORDER — PROMETHAZINE HCL 25 MG/ML IJ SOLN: 12.5000 mg | Freq: Three times a day (TID) | INTRAMUSCULAR | Status: DC | PRN

## 2012-05-15 MED ORDER — SODIUM CHLORIDE 0.9 % IV BOLUS (SEPSIS)
500.0000 mL | Freq: Once | INTRAVENOUS | Status: AC
  Administered 2012-05-15: 500 mL via INTRAVENOUS

## 2012-05-15 MED ORDER — MAGNESIUM SULFATE 40 MG/ML IJ SOLN
4.0000 g | Freq: Once | INTRAMUSCULAR | Status: AC
  Administered 2012-05-15: 4 g via INTRAVENOUS
  Filled 2012-05-15: qty 100

## 2012-05-15 MED ORDER — POTASSIUM CHLORIDE 10 MEQ/100ML IV SOLN
10.0000 meq | INTRAVENOUS | Status: AC
  Administered 2012-05-15 (×5): 10 meq via INTRAVENOUS
  Filled 2012-05-15 (×5): qty 100

## 2012-05-15 MED ORDER — POTASSIUM CHLORIDE 10 MEQ/100ML IV SOLN
10.0000 meq | INTRAVENOUS | Status: AC
  Administered 2012-05-15 (×6): 10 meq via INTRAVENOUS
  Filled 2012-05-15 (×6): qty 100

## 2012-05-15 NOTE — Progress Notes (Signed)
Pt with K+ of 2.6. NP stated she would look at all of pt's blood work upon its being resulted. Will f/u once h&H resulted.

## 2012-05-15 NOTE — Progress Notes (Signed)
Paged midlevel to inform that pt's labs are resulted. Awaiting call back.

## 2012-05-15 NOTE — Progress Notes (Signed)
Patient ID: Maria Mckinney, female   DOB: 05-Jun-1950, 62 y.o.   MRN: [id number]  [facility] PROGRESS NOTE  Maria Mckinney ZHY:[id number] DOB: Aug 14, 1950 DOA: 05/10/2012 PCP: [name], MD  Brief narrative:  Patient is a 62 y.o. female with a PMHx of breast cancer currently undergoing chemotherapy, who presents to [facility] for evaluation of fever, chills and nausea. Patient was apparently doing well until one day prior to admission when she went for her chemotherapy session. Patient completed her chemotherapy session without any problems. She started feeling fever which was noted to be 100.5 at home, associated with chills. Patient also complained of nausea which started one day before admission, a/w weakness all over on the day of admission. She also has not eaten anything since morning due to persistent nausea. Patient also complains of difficulty urination although she denies abdominal pain.  Patient evaluation in the emergency room was suggestive of fevers of 102.5 F, severely neutropenic and urine analysis consistent with infection.   Principal Problem:  *Urinary tract infection  - no fevers noted since admission, urine culture positive for Escherichia coli, sensitive to Zosyn  - pt clinically improving but slightly somnolent this morning, she is able to report no pain, no chest pain, no shortness of breath - Source of fever on admission likely to be secondary to UTI, will discontinue vancomycin and will continue Zosyn - continue supportive care with IVF, analgesia and antiemetics as needed  - As sodium level is still low will provide additional normal saline bolus today, 500 cc, increase IVF to 100 cc/hr Active Problems:  Cancer of upper-outer 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 Donney Dice continue to followup on her recommendations Neutropenia  - likely sequela of chemotherapy in the setting of UTI, numbers slightly down compared to yesterday - continue antibiotic  Zosyn, discontinue vancomycin - urine culture positive for Escherichia coli, sensitive to Zosyn - CBC with diff in AM  GERD (gastroesophageal reflux disease)  - stable  Hypertension  - stable Pancytopenia  - likely sequela of chemotherapy, counts trending down  - treatment with ABX as noted above  - hold heparin products, continue SCD's  - Transfused 2 units of PRBC 01/13 - CBC in AM, plan transfusion if hemoglobin < 8  Acute renal failure  - Likely of prerenal UTI  - Will provide additional bolus of normal saline as noted above and will continue IV fluids at 100 cc per hour  - BMP in AM  Hyponatremia  - secondary to dehydration, slightly improved since yesterday - provide additional NS bolus today  - IVF to continue at 100 cc per hour and BMP in AM  Electrolytes disturbance  - Severely depleted potassium and magnesium, daily IV supplementations provided and levels still significantly low  - will continue to supplement potassium and magnesium  - BMP in AM and also Mg in AM   Consultants:  Oncology Procedures/Studies:  Dg Chest Port 1 View  05/10/2012  No active disease. No significant change.  Antibiotics:  Vancomycin 01/09 --> 05/15/2012 Zosyn 01/09 -->  Code Status: Full  Family Communication: Pt and husband at bedside  Disposition Plan: Obtain physical therapy evaluation prior to pressure  HPI/Subjective: No events overnight.   Objective: Filed Vitals:   05/14/12 1817 05/14/12 2250 05/15/12 0455 05/15/12 1353  BP: 147/71 148/70 154/64 132/83  Pulse: 114 109 108 112  Temp: 98.2 F (36.8 C) 98.7 F (37.1 C) 98.7 F (37.1 C) 99.4 F (37.4  C)  TempSrc:  Oral Oral   Resp: 16 20 16 18   Height:      Weight:      SpO2: 99% 100% 99% 100%    Intake/Output Summary (Last 24 hours) at 05/15/12 1429 Last data filed at 05/15/12 1419  Gross per 24 hour  Intake   4844 ml  Output   2900 ml  Net   1944 ml    Exam:   General:  Pt is somewhat somnolent this AM but  follows commands appropriately, easy to arouse, not in acute distress  Cardiovascular: Regular rhythm, tachycardic, S1/S2, no murmurs, no rubs, no gallops  Respiratory: Clear to auscultation bilaterally, decreased breath sounds at bases  Abdomen: Soft, non tender, non distended, bowel sounds present, no guarding  Extremities: No edema, pulses DP and PT palpable bilaterally  Neuro: Grossly nonfocal  Data Reviewed: Basic Metabolic Panel:  Lab 05/15/12 1914 05/14/12 1916 05/14/12 0450 05/13/12 0400 05/12/12 1441 05/10/12 2359  NA 130* 128* 126* 129* 126* --  K 2.4* 3.2* 2.4* 2.3* 3.0* --  CL 95* 96 94* 95* 92* --  CO2 21 21 21 22 21  --  GLUCOSE 84 78 87 110* 127* --  BUN 10 11 11 10 9  --  CREATININE 1.12* 1.07 1.14* 1.00 1.07 --  CALCIUM 7.9* 8.2* 8.0* 8.3* 8.1* --  MG 1.1* -- -- 2.1 -- 0.9*  PHOS -- -- -- -- -- --   Liver Function Tests:  Lab 05/10/12 1909 05/09/12 0939  AST 30 78*  ALT 69* 137*  ALKPHOS 59 85  BILITOT 3.9* 2.42*  PROT 5.9* 6.2*  ALBUMIN 2.8* 3.9   CBC:  Lab 05/15/12 0510 05/14/12 1915 05/14/12 0450 05/13/12 0400 05/12/12 0420 05/11/12 1225  WBC 0.7* -- 0.9* 0.7* 0.7* 0.9*  NEUTROABS -- -- -- -- -- --  HGB 8.2* 8.6* 6.7* 7.5* 8.8* --  HCT 22.7* 23.5* 18.8* 20.7* 24.4* --  MCV 86.6 -- 86.2 85.9 85.9 86.1  PLT 23* -- 32* 39* 69* 66*    Recent Results (from the past 240 hour(s))  CULTURE, BLOOD (ROUTINE X 2)     Status: Normal (Preliminary result)   Collection Time   05/10/12  7:00 PM      Component Value Range Status Comment   Specimen Description BLOOD PORT   Final    Special Requests BOTTLES DRAWN AEROBIC AND ANAEROBIC 5CC EACH   Final    Culture  Setup Time 05/11/2012 02:16   Final    Culture     Final    Value:        BLOOD CULTURE RECEIVED NO GROWTH TO DATE CULTURE WILL BE HELD FOR 5 DAYS BEFORE ISSUING A FINAL NEGATIVE REPORT   Report Status PENDING   Incomplete   CULTURE, BLOOD (ROUTINE X 2)     Status: Normal (Preliminary result)    Collection Time   05/10/12  7:15 PM      Component Value Range Status Comment   Specimen Description BLOOD RIGHT ARM   Final    Special Requests BOTTLES DRAWN AEROBIC AND ANAEROBIC 5CC EACH   Final    Culture  Setup Time 05/11/2012 02:16   Final    Culture     Final    Value:        BLOOD CULTURE RECEIVED NO GROWTH TO DATE CULTURE WILL BE HELD FOR 5 DAYS BEFORE ISSUING A FINAL NEGATIVE REPORT   Report Status PENDING   Incomplete   URINE CULTURE  Status: Normal   Collection Time   05/10/12  8:11 PM      Component Value Range Status Comment   Specimen Description URINE, CLEAN CATCH   Final    Special Requests NONE   Final    Culture  Setup Time 05/11/2012 02:42   Final    Colony Count 50,000 COLONIES/ML   Final    Culture ESCHERICHIA COLI   Final    Report Status 05/12/2012 FINAL   Final    Organism ID, Bacteria ESCHERICHIA COLI   Final   MRSA PCR SCREENING     Status: Normal   Collection Time   05/11/12  1:22 AM      Component Value Range Status Comment   MRSA by PCR NEGATIVE  NEGATIVE Final      Scheduled Meds:   . DULoxetine  60 mg Oral Daily  . levothyroxine  100 mcg Oral Daily  . pantoprazole  80 mg Oral Q1200  . ZOSYN  IV  3.375 g Intravenous Q8H  . sodium chloride  500 mL Intravenous Once  . sodium chloride  3 mL Intravenous Q12H  . vancomycin  1,000 mg Intravenous Q12H   Continuous Infusions:   . sodium chloride 75 mL/hr at 05/13/12 1313     Debbora Presto, MD  [facility] Pager [phone]  If 7PM-7AM, please contact night-coverage [email or web address] Password [email or web address] 05/15/2012, 2:29 PM   LOS: 5 days

## 2012-05-15 NOTE — Progress Notes (Signed)
Pt with platelets of 23. Midlevel paged awaiting call back.

## 2012-05-15 NOTE — Progress Notes (Addendum)
ANTIBIOTIC CONSULT NOTE - FOLLOW UP  Pharmacy Consult for Vancomycin, Zosyn Indication: neutropenia, UTI  Allergies  Allergen Reactions  . Acyclovir And Related   . Ciprofloxacin Hcl Rash  . Bactrim (Sulfamethoxazole W-Trimethoprim)    Patient Measurements: Height: 5\' 2"  (157.5 cm) Weight: 182 lb 8.7 oz (82.8 kg) IBW/kg (Calculated) : 50.1   Vital Signs: Temp: 98.7 F (37.1 C) (01/14 0455) Temp src: Oral (01/14 0455) BP: 154/64 mmHg (01/14 0455) Pulse Rate: 108  (01/14 0455) Intake/Output from previous day: 01/13 0701 - 01/14 0700 In: 2944 [P.O.:360; I.V.:1650; Blood:384; IV Piggyback:550] Out: 2000 [Urine:2000]  Labs:  [facility] 05/15/12 0510 05/14/12 1916 05/14/12 1915 05/14/12 0450 05/13/12 0400  WBC 0.7* -- -- 0.9* 0.7*  HGB 8.2* -- 8.6* 6.7* --  PLT 23* -- -- 32* 39*  LABCREA -- -- -- -- --  CREATININE 1.12* 1.07 -- 1.14* --   Estimated Creatinine Clearance: 51.3 ml/min (by C-G formula based on Cr of 1.12).  Basename 05/12/12 1950  VANCOTROUGH 13.8  VANCOPEAK --  VANCORANDOM --  GENTTROUGH --  GENTPEAK --  GENTRANDOM --  TOBRATROUGH --  TOBRAPEAK --  TOBRARND --  AMIKACINPEAK --  AMIKACINTROU --  AMIKACIN --     Microbiology: Recent Results (from the past 720 hour(s))  CULTURE, BLOOD (ROUTINE X 2)     Status: Normal (Preliminary result)   Collection Time   05/10/12  7:00 PM      Component Value Range Status Comment   Specimen Description BLOOD PORT   Final    Special Requests BOTTLES DRAWN AEROBIC AND ANAEROBIC 5CC EACH   Final    Culture  Setup Time 05/11/2012 02:16   Final    Culture     Final    Value:        BLOOD CULTURE RECEIVED NO GROWTH TO DATE CULTURE WILL BE HELD FOR 5 DAYS BEFORE ISSUING A FINAL NEGATIVE REPORT   Report Status PENDING   Incomplete   CULTURE, BLOOD (ROUTINE X 2)     Status: Normal (Preliminary result)   Collection Time   05/10/12  7:15 PM      Component Value Range Status Comment   Specimen Description BLOOD RIGHT ARM    Final    Special Requests BOTTLES DRAWN AEROBIC AND ANAEROBIC 5CC EACH   Final    Culture  Setup Time 05/11/2012 02:16   Final    Culture     Final    Value:        BLOOD CULTURE RECEIVED NO GROWTH TO DATE CULTURE WILL BE HELD FOR 5 DAYS BEFORE ISSUING A FINAL NEGATIVE REPORT   Report Status PENDING   Incomplete   URINE CULTURE     Status: Normal   Collection Time   05/10/12  8:11 PM      Component Value Range Status Comment   Specimen Description URINE, CLEAN CATCH   Final    Special Requests NONE   Final    Culture  Setup Time 05/11/2012 02:42   Final    Colony Count 50,000 COLONIES/ML   Final    Culture ESCHERICHIA COLI   Final    Report Status 05/12/2012 FINAL   Final    Organism ID, Bacteria ESCHERICHIA COLI   Final   MRSA PCR SCREENING     Status: Normal   Collection Time   05/11/12  1:22 AM      Component Value Range Status Comment   MRSA by PCR NEGATIVE  NEGATIVE Final  Anti-infectives     Start     Dose/Rate Route Frequency Ordered Stop   05/11/12 0800   vancomycin (VANCOCIN) IVPB 1000 mg/200 mL premix        1,000 mg 200 mL/hr over 60 Minutes Intravenous Every 12 hours 05/11/12 0023     05/11/12 0100   piperacillin-tazobactam (ZOSYN) IVPB 3.375 g        3.375 g 12.5 mL/hr over 240 Minutes Intravenous 3 times per day 05/11/12 0023     05/10/12 1830   piperacillin-tazobactam (ZOSYN) IVPB 3.375 g        3.375 g 100 mL/hr over 30 Minutes Intravenous  Once 05/10/12 1811 05/10/12 2020   05/10/12 1830   vancomycin (VANCOCIN) IVPB 1000 mg/200 mL premix        1,000 mg 200 mL/hr over 60 Minutes Intravenous  Once 05/10/12 1811 05/10/12 2135   05/10/12 1830   oseltamivir (TAMIFLU) capsule 75 mg        75 mg Oral  Once 05/10/12 1811 05/10/12 1907          Assessment:  61 YOF admit 1/9 with neutropenia, fever, chills.  S/p Chemo 05/03/12, herceptin weekly.  Concern for UTI.  Day #6 Vancomycin, Zosyn  Ecoli UTI  Remains neutropenic  SCr stable compared to  yesterday, CrCl 51 ml/min  Goal of Therapy:  Vancomycin trough level 15-20 mcg/ml Appropriate abx dosing, eradication of infection.  Plan:   Continue Zosyn 3.375g IV Q8H infused over 4hrs for now - Recommend narrow to cover only Ecoli UTI with something like Rocephin, Ceftin, or Keflex based on sensitivities  Continue Vancomycin 1g IV q12h for now  Follow up renal fxn and culture results.  Blood cx's still negative - expect d/c of vanc soon as appears final cx will be negative   Hessie Knows, PharmD, BCPS Pager [phone] 05/15/2012 8:54 AM

## 2012-05-15 NOTE — Evaluation (Signed)
Physical Therapy Evaluation Patient Details Name: Maria Mckinney MRN: [id number] DOB: 1951-02-05 Today's Date: 05/15/2012 Time: 5784-6962 PT Time Calculation (min): 15 min  PT Assessment / Plan / Recommendation Clinical Impression  Pt is a 62 y.o. female with a PMHx of breast cancer currently undergoing chemotherapy, who presented to [facility] for evaluation of fever, chills and nausea and diagnosed with UTI.  Pt would benefit from acute PT services in order to improve independence with transfers and ambulation to prepare for safe d/c home with spouse.  Spouse present and aware of RW and supervision recommendation for d/c home.    PT Assessment  Patient needs continued PT services    Follow Up Recommendations  Home health PT;Supervision for mobility/OOB (vs none depending on progress)    Does the patient have the potential to tolerate intense rehabilitation      Barriers to Discharge        Equipment Recommendations  Rolling walker with 5" wheels (if spouse unable to locate their RW)    Recommendations for Other Services     Frequency Min 3X/week    Precautions / Restrictions Precautions Precautions: Fall   Pertinent Vitals/Pain No pain reported during session, RN in room upon leaving.      Mobility  Bed Mobility Bed Mobility: Supine to Sit Supine to Sit: 6: Modified independent (Device/Increase time) Transfers Transfers: Sit to Stand;Stand to Sit Sit to Stand: 4: Min guard;With upper extremity assist;From bed;From toilet Stand to Sit: 4: Min guard;With upper extremity assist;To toilet;To bed Details for Transfer Assistance: verbal cues for safe technique Ambulation/Gait Ambulation/Gait Assistance: 4: Min assist Ambulation Distance (Feet): 120 Feet Assistive device: Other (Comment) (pushing IV pole) Ambulation/Gait Assistance Details: observed unsteady gait with occasional LOB, most unsteady during turning, tends to take turns wide, pushed IV pole for some UE support however  declined RW Gait Pattern: Step-through pattern;Decreased stride length Gait velocity: decreased General Gait Details: pt reports she is generally unsteady on a good day.  encouraged use of RW at home and having spouse supervision for days she feels weaker and/or unsteady    Shoulder Instructions     Exercises     PT Diagnosis: Difficulty walking  PT Problem List: Decreased activity tolerance;Decreased mobility;Decreased balance;Decreased knowledge of use of DME PT Treatment Interventions: DME instruction;Gait training;Functional mobility training;Therapeutic activities;Therapeutic exercise;Patient/family education;Balance training;Neuromuscular re-education   PT Goals Acute Rehab PT Goals PT Goal Formulation: With patient Time For Goal Achievement: 05/29/12 Potential to Achieve Goals: Good Pt will go Sit to Stand: with modified independence PT Goal: Sit to Stand - Progress: Goal set today Pt will go Stand to Sit: with modified independence PT Goal: Stand to Sit - Progress: Goal set today Pt will Ambulate: >150 feet;with modified independence;with least restrictive assistive device PT Goal: Ambulate - Progress: Goal set today Pt will Perform Home Exercise Program: with supervision, verbal cues required/provided PT Goal: Perform Home Exercise Program - Progress: Goal set today  Visit Information  Last PT Received On: 05/15/12 Assistance Needed: +1    Subjective Data  Subjective: Are we heading to the bathroom first?   Prior Functioning  Home Living Lives With: Spouse Type of Home: House Home Access: Stairs to enter Secretary/administrator of Steps: 2 Home Layout: One level Home Adaptive Equipment: Walker - rolling Prior Function Level of Independence: Independent Communication Communication: No difficulties    Cognition  Overall Cognitive Status: Appears within functional limits for tasks assessed/performed Arousal/Alertness: Awake/alert Orientation Level: Appears  intact for tasks assessed Behavior  During Session: [facility] for tasks performed    Extremity/Trunk Assessment     Balance    End of Session PT - End of Session Activity Tolerance: Patient tolerated treatment well Patient left: in bed;with call bell/phone within reach;with nursing in room;with family/visitor present  GP     Maria Mckinney,Maria Mckinney 05/15/2012, 4:07 PM Pager: [phone]

## 2012-05-15 NOTE — Consult Note (Signed)
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  She was admitted on 05/10/12 after one day of fatigue, mentation changes, and a fever.  She had febrile neutropenia.  Since her admission she's been on Zosyn and Vanc, and has been tolerating the antibiotics well.  Her Vanc was discontinued.  She's eating small amounts, however is having a hard time tolerating the food she's received.  She got up with PT today.  She is also having 4-5 soft bowel movements a day.    Objective:  Filed Vitals:   05/15/12 1353  BP: 132/83  Pulse: 112  Temp: 99.4 F (37.4 C)  Resp: 18    Body mass index is 33.39 kg/(m^2).  Intake/Output Summary (Last 24 hours) at 05/15/12 1557 Last data filed at 05/15/12 1419  Gross per 24 hour  Intake   4460 ml  Output   2900 ml  Net   1560 ml   General: Patient is a well appearing female in no acute distress HEENT: PERRLA, sclerae anicteric no conjunctival pallor, MMM, ulceration on tongue Neck: supple, no palpable adenopathy Lungs: clear to auscultation bilaterally, no wheezes, rhonchi, or rales Cardiovascular: regular rate rhythm, S1, S2, no murmurs, rubs or gallops Abdomen: Soft, non-tender, non-distended, normoactive bowel sounds, no HSM Extremities: warm and well perfused, no clubbing, cyanosis, or edema Skin: No rashes or lesions Neuro: Non-focal   CBG (last 3)  No results found for this basename: GLUCAP:3 in the last 72 hours   Labs:  Lab Results  Component Value Date   WBC 0.7* 05/15/2012   HGB 8.2* 05/15/2012   HCT 22.7* 05/15/2012   MCV 86.6 05/15/2012   PLT 23* 05/15/2012   NEUTROABS 0.3* 05/09/2012    Urine Studies No results found for this basename:  UACOL:2,UAPR:2,USPG:2,UPH:2,UTP:2,UGL:2,UKET:2,UBIL:2,UHGB:2,UNIT:2,UROB:2,ULEU:2,UEPI:2,UWBC:2,URBC:2,UBAC:2,CAST:2,CRYS:2,UCOM:2,BILUA:2 in the last 72 hours  Basic Metabolic Panel:  Lab 05/15/12 0865 05/14/12 1916 05/14/12 0450 05/13/12 0400 05/12/12 1441 05/10/12 2359  NA 130* 128* 126* 129* 126* --  K 2.4* 3.2* -- -- -- --  CL 95* 96 94* 95* 92* --  CO2 21 21 21 22 21  --  GLUCOSE 84 78 87 110* 127* --  BUN 10 11 11 10 9  --  CREATININE 1.12* 1.07 1.14* 1.00 1.07 --  CALCIUM 7.9* 8.2* 8.0* 8.3* 8.1* --  MG 1.1* -- -- 2.1 -- 0.9*  PHOS -- -- -- -- -- --   GFR Estimated Creatinine Clearance: 51.3 ml/min (by C-G formula based on Cr of 1.12). Liver Function Tests:  Lab 05/10/12 1909 05/09/12 0939  AST 30 78*  ALT 69* 137*  ALKPHOS 59 85  BILITOT 3.9* 2.42*  PROT 5.9* 6.2*  ALBUMIN 2.8* 3.9   No results found for this basename: LIPASE:5,AMYLASE:5 in the last 168 hours No results found for this basename: AMMONIA:5 in the last 168 hours Coagulation profile No results found for this basename: INR:5,PROTIME:5 in the last 168 hours  CBC:  Lab 05/15/12 0510 05/14/12 1915 05/14/12 0450 05/13/12 0400 05/12/12 0420 05/11/12 1225  WBC 0.7* -- 0.9* 0.7* 0.7* 0.9*  NEUTROABS -- -- -- -- -- --  HGB 8.2* 8.6* 6.7* 7.5* 8.8* --  HCT 22.7* 23.5* 18.8* 20.7* 24.4* --  MCV 86.6 -- 86.2 85.9 85.9 86.1  PLT 23* -- 32* 39* 69* 66*   Cardiac Enzymes:  No results found for this basename: CKTOTAL:5,CKMB:5,CKMBINDEX:5,TROPONINI:5 in the last 168 hours BNP: No components found with this basename: POCBNP:5 CBG: No results found for this basename: GLUCAP:5 in the last 168 hours D-Dimer No results found for this basename: DDIMER:2 in the last 72 hours Hgb A1c No results found for this basename: HGBA1C:2 in the last 72 hours Lipid Profile No results found for this basename: CHOL:2,HDL:2,LDLCALC:2,TRIG:2,CHOLHDL:2,LDLDIRECT:2 in the last 72 hours Thyroid function studies No results found for  this basename: TSH,T4TOTAL,FREET3,T3FREE,THYROIDAB in the last 72 hours Anemia work up No results found for this basename: VITAMINB12:2,FOLATE:2,FERRITIN:2,TIBC:2,IRON:2,RETICCTPCT:2 in the last 72 hours Microbiology Recent Results (from the past 240 hour(s))  CULTURE, BLOOD (ROUTINE X 2)     Status: Normal (Preliminary result)   Collection Time   05/10/12  7:00 PM      Component Value Range Status Comment   Specimen Description BLOOD PORT   Final    Special Requests BOTTLES DRAWN AEROBIC AND ANAEROBIC 5CC EACH   Final    Culture  Setup Time 05/11/2012 02:16   Final    Culture     Final    Value:        BLOOD CULTURE RECEIVED NO GROWTH TO DATE CULTURE WILL BE HELD FOR 5 DAYS BEFORE ISSUING A FINAL NEGATIVE REPORT   Report Status PENDING   Incomplete   CULTURE, BLOOD (ROUTINE X 2)     Status: Normal (Preliminary result)   Collection Time   05/10/12  7:15 PM      Component Value Range Status Comment   Specimen Description BLOOD RIGHT ARM   Final    Special Requests BOTTLES DRAWN AEROBIC AND ANAEROBIC 5CC EACH   Final    Culture  Setup Time 05/11/2012 02:16   Final    Culture     Final    Value:        BLOOD CULTURE RECEIVED NO GROWTH TO DATE CULTURE WILL BE HELD FOR 5 DAYS BEFORE ISSUING A FINAL NEGATIVE REPORT   Report Status PENDING   Incomplete   URINE CULTURE     Status: Normal   Collection Time   05/10/12  8:11 PM      Component Value Range Status Comment   Specimen Description URINE, CLEAN CATCH   Final    Special Requests NONE   Final    Culture  Setup Time 05/11/2012 02:42   Final    Colony Count 50,000 COLONIES/ML   Final    Culture ESCHERICHIA COLI   Final    Report Status 05/12/2012 FINAL   Final    Organism ID, Bacteria ESCHERICHIA COLI   Final   MRSA PCR SCREENING     Status: Normal   Collection Time   05/11/12  1:22 AM      Component Value Range Status Comment   MRSA by PCR NEGATIVE  NEGATIVE Final       Studies:  No results found.  Assessment: 62 y.o. with stage  1 Her-2 + left breast cancer.   1. Breast cancer  2. Febrile Neutropenia  3. Anemia  4. FEN  5. Weakness/Debilitation    Plan:  1. Pt has completed her neoadjuvant chemotherapy.  She needs to have a diagnostic mammogram and ultrasound to evaluate her response.  This will be arranged at f/u.  Will receive Herceptin.    2. Pt has grown out Ecoli in her urine.  Continue Zosyn IV.  Blood cultures continue to be negative.  Prolonged Neutropenia likely the effect  of cumulative myelosuppressive chemotherapy.  White count should begin to recover soon.    3. Anemia.  Continue to transfuse as indicated.  As above, should recover soon.     4. FEN: Continue IV hydration, would increase rate if patient able to tolerate it, and supplementing potassium and magnesium.  Encouraged PO intake.  Would consider giving oral potassium scheduled BID if patient able to tolerate, and supplementing IV if necessary, follow BID K and mag.    5. Pt very weak, feels like she's losing strength.  PT is following.      6. FULL CODE   Lindsey C. Lyn Hollingshead, NP Medical Oncology [facility] Phone: [phone]  Augustin Schooling 05/15/2012  ATTENDING'S ATTESTATION:  I personally reviewed patient's chart, examined patient myself, formulated the treatment plan as followed.   I agree with Lindseys assessment and plan.  Drue Second, MD Medical/Oncology [facility] [phone] (beeper) [phone] (Office)  05/15/2012, 3:57 PM

## 2012-05-16 ENCOUNTER — Other Ambulatory Visit: Payer: BC Managed Care – PPO | Admitting: Lab

## 2012-05-16 ENCOUNTER — Ambulatory Visit: Payer: BC Managed Care – PPO | Admitting: Adult Health

## 2012-05-16 ENCOUNTER — Ambulatory Visit: Payer: BC Managed Care – PPO

## 2012-05-16 DIAGNOSIS — E876 Hypokalemia: Secondary | ICD-10-CM | POA: Diagnosis present

## 2012-05-16 DIAGNOSIS — D61818 Other pancytopenia: Secondary | ICD-10-CM | POA: Diagnosis present

## 2012-05-16 DIAGNOSIS — D696 Thrombocytopenia, unspecified: Secondary | ICD-10-CM | POA: Diagnosis present

## 2012-05-16 DIAGNOSIS — E871 Hypo-osmolality and hyponatremia: Secondary | ICD-10-CM | POA: Diagnosis present

## 2012-05-16 DIAGNOSIS — N179 Acute kidney failure, unspecified: Secondary | ICD-10-CM | POA: Diagnosis present

## 2012-05-16 DIAGNOSIS — D638 Anemia in other chronic diseases classified elsewhere: Secondary | ICD-10-CM | POA: Diagnosis present

## 2012-05-16 DIAGNOSIS — A419 Sepsis, unspecified organism: Secondary | ICD-10-CM | POA: Diagnosis present

## 2012-05-16 LAB — CBC WITH DIFFERENTIAL/PLATELET
Basophils Relative: 0 % (ref 0–1)
Eosinophils Relative: 0 % (ref 0–5)
HCT: 20.2 % — ABNORMAL LOW (ref 36.0–46.0)
Hemoglobin: 7.1 g/dL — ABNORMAL LOW (ref 12.0–15.0)
Lymphs Abs: 0.5 10*3/uL — ABNORMAL LOW (ref 0.7–4.0)
MCH: 30.3 pg (ref 26.0–34.0)
MCV: 86.3 fL (ref 78.0–100.0)
Monocytes Absolute: 0.1 10*3/uL (ref 0.1–1.0)
Monocytes Relative: 10 % (ref 3–12)
RBC: 2.34 MIL/uL — ABNORMAL LOW (ref 3.87–5.11)
WBC: 1 10*3/uL — CL (ref 4.0–10.5)

## 2012-05-16 LAB — PREPARE RBC (CROSSMATCH)

## 2012-05-16 LAB — BASIC METABOLIC PANEL
BUN: 8 mg/dL (ref 6–23)
CO2: 21 mEq/L (ref 19–32)
Chloride: 96 mEq/L (ref 96–112)
Creatinine, Ser: 1.11 mg/dL — ABNORMAL HIGH (ref 0.50–1.10)
GFR calc Af Amer: 60 mL/min — ABNORMAL LOW (ref >90)
Potassium: 2.7 mEq/L — CL (ref 3.5–5.1)

## 2012-05-16 MED ORDER — EPINEPHRINE HCL 0.1 MG/ML IJ SOLN
0.2500 mg | Freq: Once | INTRAMUSCULAR | Status: AC | PRN
  Filled 2012-05-16: qty 10

## 2012-05-16 MED ORDER — HEPARIN SOD (PORK) LOCK FLUSH 100 UNIT/ML IV SOLN: 250.0000 [IU] | Freq: Once | INTRAVENOUS | Status: AC | PRN

## 2012-05-16 MED ORDER — POTASSIUM CHLORIDE 10 MEQ/100ML IV SOLN
10.0000 meq | INTRAVENOUS | Status: AC
  Administered 2012-05-16 (×4): 10 meq via INTRAVENOUS
  Filled 2012-05-16 (×4): qty 100

## 2012-05-16 MED ORDER — POTASSIUM CHLORIDE IN NACL 20-0.9 MEQ/L-% IV SOLN
INTRAVENOUS | Status: DC
  Administered 2012-05-16 – 2012-05-18 (×3): via INTRAVENOUS
  Administered 2012-05-19: 10 mL/h via INTRAVENOUS
  Filled 2012-05-16 (×8): qty 1000

## 2012-05-16 MED ORDER — DIPHENHYDRAMINE HCL 50 MG PO CAPS
50.0000 mg | ORAL_CAPSULE | Freq: Once | ORAL | Status: AC
  Administered 2012-05-16: 50 mg via ORAL
  Filled 2012-05-16 (×2): qty 1

## 2012-05-16 MED ORDER — ALTEPLASE 2 MG IJ SOLR
2.0000 mg | Freq: Once | INTRAMUSCULAR | Status: AC | PRN
  Filled 2012-05-16: qty 2

## 2012-05-16 MED ORDER — FAMOTIDINE IN NACL 20-0.9 MG/50ML-% IV SOLN
20.0000 mg | Freq: Once | INTRAVENOUS | Status: AC | PRN
  Filled 2012-05-16: qty 50

## 2012-05-16 MED ORDER — SODIUM CHLORIDE 0.9 % IV SOLN
Freq: Once | INTRAVENOUS | Status: AC
  Administered 2012-05-16: 14:00:00 via INTRAVENOUS

## 2012-05-16 MED ORDER — ACETAMINOPHEN 325 MG PO TABS
650.0000 mg | ORAL_TABLET | Freq: Once | ORAL | Status: AC
  Administered 2012-05-16: 650 mg via ORAL
  Filled 2012-05-16: qty 1

## 2012-05-16 MED ORDER — METHYLPREDNISOLONE SODIUM SUCC 125 MG IJ SOLR
125.0000 mg | Freq: Once | INTRAMUSCULAR | Status: AC | PRN
  Filled 2012-05-16: qty 2

## 2012-05-16 MED ORDER — SODIUM CHLORIDE 0.9 % IJ SOLN
10.0000 mL | INTRAMUSCULAR | Status: DC | PRN
  Administered 2012-05-16 – 2012-05-21 (×2): 10 mL

## 2012-05-16 MED ORDER — EPINEPHRINE HCL 1 MG/ML IJ SOLN
0.5000 mg | Freq: Once | INTRAMUSCULAR | Status: AC | PRN
  Filled 2012-05-16: qty 1

## 2012-05-16 MED ORDER — DIPHENHYDRAMINE HCL 50 MG/ML IJ SOLN: 25.0000 mg | Freq: Once | INTRAMUSCULAR | Status: AC | PRN

## 2012-05-16 MED ORDER — SODIUM CHLORIDE 0.9 % IV SOLN: Freq: Once | INTRAVENOUS | Status: AC | PRN

## 2012-05-16 MED ORDER — TRASTUZUMAB CHEMO INJECTION 440 MG
2.0000 mg/kg | Freq: Once | INTRAVENOUS | Status: AC
  Administered 2012-05-16: 168 mg via INTRAVENOUS
  Filled 2012-05-16: qty 8

## 2012-05-16 MED ORDER — HEPARIN SOD (PORK) LOCK FLUSH 100 UNIT/ML IV SOLN: 500.0000 [IU] | Freq: Once | INTRAVENOUS | Status: AC | PRN

## 2012-05-16 MED ORDER — MAGNESIUM SULFATE 40 MG/ML IJ SOLN
2.0000 g | Freq: Once | INTRAMUSCULAR | Status: AC
  Administered 2012-05-16: 2 g via INTRAVENOUS
  Filled 2012-05-16: qty 50

## 2012-05-16 MED ORDER — DIPHENHYDRAMINE HCL 50 MG/ML IJ SOLN: 50.0000 mg | Freq: Once | INTRAMUSCULAR | Status: AC | PRN

## 2012-05-16 MED ORDER — LIDOCAINE-PRILOCAINE 2.5-2.5 % EX CREA
TOPICAL_CREAM | Freq: Once | CUTANEOUS | Status: AC
  Administered 2012-05-16: 13:00:00 via TOPICAL
  Filled 2012-05-16: qty 5

## 2012-05-16 MED ORDER — SODIUM CHLORIDE 0.9 % IJ SOLN: 3.0000 mL | INTRAMUSCULAR | Status: DC | PRN

## 2012-05-16 MED ORDER — ALBUTEROL SULFATE (2.5 MG/3ML) 0.083% IN NEBU
2.5000 mg | INHALATION_SOLUTION | Freq: Once | RESPIRATORY_TRACT | Status: AC | PRN
  Filled 2012-05-16: qty 3

## 2012-05-16 NOTE — Progress Notes (Signed)
This RN checked pt's blood admission record. Pt only received 1 unit of blood on 05/14/12.

## 2012-05-16 NOTE — Progress Notes (Signed)
[facility] PROGRESS NOTE  Maria Mckinney WUJ:[id number] DOB: 10/19/1950 DOA: 05/10/2012 PCP: [name], MD  Brief narrative: 62 year old female with past medical history of breast cancer undergoing chemotherapy who presented to [facility] 05/10/2012 for evaluation of fever, chills and nausea. Patient was doing well until one day prior to admission when she went for her chemotherapy session. Patient has completed her chemotherapy session without any problems but then she started feeling feverish, fever 100.5 at home associated with chills. Patient also had intermittent nausea associated with generalized weakness. In ED, patient was found to be severely neutropenic with a white blood cell count is 0.7. In addition patient had hypokalemia, hyponatremia and thrombocytopenia. Patient was subsequently admitted for neutropenic fever.  Assessment and plan:  Principal Problem:  *Sepsis, urinary tract infection  Sepsis secondary to urinary tract infection secondary to Escherichia coli  Calcitonin elevated at 1.37  For now we will continue Zosyn until sensitivity report available  Patient remains afebrile since the admission but continues to have neutropenia  Continue IV fluids, normal saline with addition of potassium in IV fluids  Continue anti-and medics as needed  Appreciate oncology following   Active Problems:  Cancer of upper-outer quadrant of female breast   Appreciate oncology following and their recommendations   Continue Herceptin Pancytopenia  This is likely secondary to sequela of chemotherapy  White blood cell count slowly trending up, 1 today  Hemoglobin dropped to 7.1 (yesterday 8.2). We will transfuse 1 unit of PRBC today. 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 Drue Second)  Procedures:  None  Antibiotics:  Zosyn 05/10/2012 -->  Vancomycin 05/10/2012 and discontinued at 05/15/2012  HPI/Subjective: No acute overnight events. Patient reports nausea but no vomiting.  Objective: Filed Vitals:   05/15/12 0455 05/15/12 1353 05/15/12 2118 05/16/12 0606  BP: 154/64 132/83 147/71 127/68  Pulse: 108 112 106 102  Temp: 98.7 F (37.1 C) 99.4 F (37.4 C) 98.8 F (37.1 C) 99.1 F (37.3 C)  TempSrc: Oral  Oral Oral  Resp: 16 18 16 18   Height:      Weight:      SpO2: 99% 100% 99% 98%    Intake/Output Summary (Last 24 hours) at 05/16/12 8657 Last data filed at 05/16/12 8469  Gross per 24 hour  Intake 4069.58 ml  Output   1700 ml  Net 2369.58 ml    Exam:   General:  Pt is alert, follows commands appropriately, not in acute distress  Cardiovascular: Regular rate and rhythm, S1/S2, no murmurs, no rubs, no gallops  Respiratory: Clear to auscultation bilaterally, no wheezing, no crackles, no rhonchi  Abdomen: Soft, non tender, non distended, bowel sounds present, no guarding  Extremities: No edema, pulses DP and PT palpable bilaterally  Neuro: Grossly nonfocal  Data Reviewed: Basic Metabolic Panel:  Lab 05/16/12 1610 05/15/12 0510 05/14/12 1916 05/14/12 0450 05/13/12 0400 05/10/12 2359  NA 130* 130* 128* 126* 129* --  K 2.7* 2.4* 3.2* 2.4* 2.3* --  CL 96  95* 96 94* 95* --  CO2 21 21 21 21 22  --  GLUCOSE 91 84 78 87 110* --  BUN 8 10 11 11 10  --  CREATININE 1.11* 1.12* 1.07 1.14* 1.00 --  CALCIUM 8.0* 7.9* 8.2* 8.0* 8.3* --  MG -- 1.1* -- -- 2.1 0.9*  PHOS -- -- -- -- -- --   Liver Function Tests:  Lab 05/10/12 1909 05/09/12 0939  AST 30 78*  ALT 69* 137*  ALKPHOS 59 85  BILITOT 3.9* 2.42*  PROT 5.9* 6.2*  ALBUMIN 2.8* 3.9   CBC:  Lab 05/16/12 0550 05/15/12 0510 05/14/12 1915 05/14/12 0450 05/13/12 0400 05/12/12 0420  WBC 1.0* 0.7* -- 0.9* 0.7* 0.7*  NEUTROABS 0.4* -- -- -- -- --  HGB 7.1* 8.2* 8.6* 6.7* 7.5* --  HCT 20.2* 22.7* 23.5* 18.8* 20.7* --  MCV 86.3 86.6 -- 86.2 85.9 85.9  PLT 27* 23* -- 32* 39* 69*    CULTURE, BLOOD (ROUTINE X 2)     Status: Normal (Preliminary result)   Collection Time   05/10/12  7:00 PM      Component Value Range Status Comment   Culture     Final    Value:        BLOOD CULTURE RECEIVED NO GROWTH TO DATE    Report Status PENDING   Incomplete   CULTURE, BLOOD (ROUTINE X 2)     Status: Normal (Preliminary result)   Collection Time   05/10/12  7:15 PM      Component Value Range Status Comment   Culture     Final    Value:        BLOOD CULTURE RECEIVED NO GROWTH TO DATE   Report Status PENDING   Incomplete   URINE CULTURE     Status: Normal   Collection Time   05/10/12  8:11 PM      Component Value Range Status Comment   Report Status 05/12/2012 FINAL   Final    Organism ID, Bacteria ESCHERICHIA COLI   Final   MRSA PCR SCREENING     Status: Normal   Collection Time   05/11/12  1:22 AM      Component Value Range Status Comment   MRSA by PCR NEGATIVE  NEGATIVE Final      Studies: No results found.  Scheduled Meds:   . DULoxetine  60 mg Oral Daily  . levothyroxine  100 mcg Oral Daily  . pantoprazole  80 mg Oral Q1200  . piperacillin-tazobactam   3.375 g Intravenous Q8H  . potassium chloride  10 mEq Intravenous Q1 Hr x 4   Continuous Infusions:   . sodium chloride 1,000 mL (05/16/12  0255)

## 2012-05-16 NOTE — Progress Notes (Signed)
Spoke with Cloyde Reams, NP to clarify blood transfusion orders.  Per MD notes from 05/14/12 and 05/16/12 pt was to receive 3 units of PRBC's.  At this time pt has only received one unit.  Orders given to transfuse, blood bank had unit labeled and ready for pt.  Consent obtained from pt, transfusion initiated and pt tolerating w/o difficulties.  Will continue to monitor.

## 2012-05-16 NOTE — Progress Notes (Signed)
CRITICAL VALUE ALERT  Critical value received: Potassium  Date of notification:  05/16/2012  Time of notification:  0635  Critical value read back:yes  Nurse who received alert:  Rogelia Rohrer  MD notified (1st page):  Leanne Chang NP  Time of first page:  [phone]  MD notified (2nd page):  Time of second page:  Responding MD:  Tama Gander NP  Time MD responded:  0700

## 2012-05-17 ENCOUNTER — Ambulatory Visit (HOSPITAL_COMMUNITY): Payer: BC Managed Care – PPO

## 2012-05-17 ENCOUNTER — Other Ambulatory Visit: Payer: Self-pay | Admitting: Certified Registered Nurse Anesthetist

## 2012-05-17 DIAGNOSIS — D638 Anemia in other chronic diseases classified elsewhere: Secondary | ICD-10-CM

## 2012-05-17 LAB — CBC
HCT: 27.6 % — ABNORMAL LOW (ref 36.0–46.0)
MCV: 84.7 fL (ref 78.0–100.0)
RBC: 3.26 MIL/uL — ABNORMAL LOW (ref 3.87–5.11)
WBC: 1.3 10*3/uL — CL (ref 4.0–10.5)

## 2012-05-17 LAB — BASIC METABOLIC PANEL
BUN: 8 mg/dL (ref 6–23)
CO2: 21 mEq/L (ref 19–32)
Chloride: 95 mEq/L — ABNORMAL LOW (ref 96–112)
Creatinine, Ser: 1.07 mg/dL (ref 0.50–1.10)
GFR calc Af Amer: 63 mL/min — ABNORMAL LOW (ref >90)
Potassium: 2.6 mEq/L — CL (ref 3.5–5.1)

## 2012-05-17 LAB — CULTURE, BLOOD (ROUTINE X 2): Culture: NO GROWTH

## 2012-05-17 LAB — MAGNESIUM: Magnesium: 1.6 mg/dL (ref 1.5–2.5)

## 2012-05-17 MED ORDER — POTASSIUM CHLORIDE 10 MEQ/100ML IV SOLN
10.0000 meq | INTRAVENOUS | Status: AC
  Administered 2012-05-17 (×4): 10 meq via INTRAVENOUS
  Filled 2012-05-17 (×4): qty 100

## 2012-05-17 MED ORDER — LOPERAMIDE HCL 2 MG PO CAPS
2.0000 mg | ORAL_CAPSULE | ORAL | Status: DC | PRN
  Administered 2012-05-17 – 2012-05-21 (×5): 2 mg via ORAL
  Filled 2012-05-17 (×5): qty 1

## 2012-05-17 MED ORDER — POTASSIUM CHLORIDE CRYS ER 20 MEQ PO TBCR
40.0000 meq | EXTENDED_RELEASE_TABLET | Freq: Two times a day (BID) | ORAL | Status: DC
  Administered 2012-05-17 – 2012-05-21 (×9): 40 meq via ORAL
  Filled 2012-05-17 (×10): qty 2

## 2012-05-17 NOTE — Progress Notes (Signed)
CRITICAL VALUE ALERT  Critical value received:  K - 2.6  Date of notification:  05/17/2012  Time of notification:  0648  Critical value read back: yes  Nurse who received alert: Cynda Familia, RN  MD notified (1st page):  Merdis Delay, NP  Time of first page:  [phone]  MD notified (2nd page):  Time of second page:  Responding MD:  Merdis Delay, NP  Time MD responded:  [phone]  Order given for 4 runs of K+.

## 2012-05-17 NOTE — Consult Note (Signed)
[facility]  [phone]   DIAGNOSIS: 62 y/o female with stage 1 Her-2positive ER/PR negative breast cancer in the left breast diagnosed October 2013.   PRIOR THERAPY:   #1Patient was originally seen in the multidisciplinary breast clinic for new diagnosis of left breast cancer that was 8mm in the upper outer quadrant of the left breast. 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 There was an additional 4 cm area of normal signal. Patient did not get an MRI secondary to cochlear implant. She desires breast conservation.   #2 Patient is s/p port-a-cath placement for neoadjuvant chemotherapy consisting of Taxotere/carboplatinum/Herceptin. She began cycle #1 on 02/29/2012. She completed 4 of 4 cycles of Taxotere and carboplatinum every 21 days on 1/3//2014,  with Herceptin given weekly, last dose on 05/16/2013, due for more Herceptin on 1/22.Marland Kitchen  CURRENT THERAPY: C4 day 8 TCH with Neulasta support on 1/8/2-013.     HOSPITAL PROGRESS NOTE  Maria Mckinney was admitted on 05/10/2011 under the Hospitalist service with neutropenic fever. ANC was 0.3, WBC was 1.0. H/H initially was 11.8/33.1,then significantly decreasing to 6.7 on 1/13 requiring 2  Units of blood, with some response, and another unit on 1/15 for H/H of 7.1/20.2, closely monitored, today with a Hb of 10.1.   Platelets were 127k, dropping in 1/10 to 8.4/23.6 and plt of 66k in the setting of recent chemo and acute illness,which continued to decrease to  32k on 1/13 with subsequent discontinuation of Lovenox prophylaxis. After decreasing to 23k, they began to trend up, today at 29k .  Cultures were positive for GNR in urine, Mckinney. Coli; Blood cultures were negative.She was placed on  Zosyn and Vanco per pharmacy. She was also given Tamiflu on admission. We were kindly informed of the patient's hospitalization.Currently her ANC is  0.4  With WBC  1.3.  Events since admission noted. She has less nausea, denies vomiting, Denies any chills or night sweats. Has intermittent episodes of diarrhea, controlled with Imodium.  MEDICATIONS:    . DULoxetine  60 mg Oral Daily  . levothyroxine  100 mcg Oral Daily  . pantoprazole  80 mg Oral Q1200  . piperacillin-tazobactam (ZOSYN)  IV  3.375 g Intravenous Q8H  . potassium chloride  40 mEq Oral BID  . sodium chloride  500 mL Intravenous Once  . sodium chloride  3 mL Intravenous Q12H    ALLERGIES:   Allergies  Allergen Reactions  . Acyclovir And Related   . Ciprofloxacin Hcl Rash  . Bactrim (Sulfamethoxazole W-Trimethoprim)      PHYSICAL EXAMINATION:   Filed Vitals:   05/17/12 1347  BP: 158/68  Pulse: 102  Temp: 98.3 F (36.8 C)  Resp: 18   Filed Weights   05/11/12 0000  Weight: 182 lb 8.7 oz (82.18 kg)    62 year old  in no acute distress A. and O. x3 General well-developed and well-nourished Alopecia. HEENT: Normocephalic, atraumatic, PERRLA. Oral cavity without thrush or lesions. Neck supple. no thyromegaly, no cervical or supraclavicular adenopathy  Lungs clear bilaterally . No wheezing, rhonchi or rales. Cardiac: regular rate and rhythm,no murmur , rubs or gallops Abdomen soft nontender , bowel sounds x4. No HSM Extremities no clubbing cyanosis, trace edema bilaterally. No petechial rash Neuro: non focal  LABORATORY/RADIOLOGY DATA:   Lab 05/17/12 0525 05/16/12 0550 05/15/12 0510 05/14/12 1915 05/14/12 0450 05/13/12 0400 05/10/12 1909  WBC 1.3* 1.0* 0.7* -- 0.9* 0.7* --  HGB  10.1* 7.1* 8.2* 8.6* 6.7* -- --  HCT 27.6* 20.2* 22.7* 23.5* 18.8* -- --  PLT 29* 27* 23* -- 32* 39* --  MCV 84.7 86.3 86.6 -- 86.2 85.9 --  MCH 31.0 30.3 31.3 -- 30.7 31.1 --  MCHC 36.6* 35.1 36.1* -- 35.6 36.2* --  RDW 17.4* 17.8* 17.8* -- 18.7* 18.0* --  LYMPHSABS -- 0.5* 0.0* -- -- -- 0.0*  MONOABS -- 0.1 0.0* -- -- -- 0.0*  EOSABS -- 0.0 0.0 -- -- -- 0.0  BASOSABS -- 0.0 0.0 -- --  -- 0.0  BANDABS -- -- -- -- -- -- --    CMP    Lab 05/17/12 0525 05/16/12 0550 05/15/12 0510 05/14/12 1916 05/14/12 0450 05/13/12 0400 05/10/12 2359 05/10/12 1909  NA 132* 130* 130* 128* 126* -- -- --  K 2.6* 2.7* 2.4* 3.2* 2.4* -- -- --  CL 95* 96 95* 96 94* -- -- --  CO2 21 21 21 21 21  -- -- --  GLUCOSE 80 91 84 78 87 -- -- --  BUN 8 8 10 11 11  -- -- --  CREATININE 1.07 1.11* 1.12* 1.07 1.14* -- -- --  CALCIUM 8.5 8.0* 7.9* 8.2* 8.0* -- -- --  MG 1.6 -- 1.1* -- -- 2.1 0.9* --  AST -- -- -- -- -- -- -- 30  ALT -- -- -- -- -- -- -- 69*  ALKPHOS -- -- -- -- -- -- -- 59  BILITOT -- -- -- -- -- -- -- 3.9*        Component Value Date/Time   BILITOT 3.9* 05/10/2012 1909   BILITOT 2.42* 05/09/2012 0939      Urinalysis    Component Value Date/Time   COLORURINE AMBER* 05/10/2012 2224   APPEARANCEUR CLEAR 05/10/2012 2224   LABSPEC 1.018 05/10/2012 2224   LABSPEC 1.010 03/28/2012 0846   PHURINE 7.0 05/10/2012 2224   GLUCOSEU NEGATIVE 05/10/2012 2224   HGBUR TRACE* 05/10/2012 2224   BILIRUBINUR SMALL* 05/10/2012 2224   KETONESUR TRACE* 05/10/2012 2224   PROTEINUR 100* 05/10/2012 2224   UROBILINOGEN 0.2 05/10/2012 2224   NITRITE NEGATIVE 05/10/2012 2224   LEUKOCYTESUR TRACE* 05/10/2012 2224     Liver Function Tests:  Lab 05/10/12 1909  AST 30  ALT 69*  ALKPHOS 59  BILITOT 3.9*  PROT 5.9*  ALBUMIN 2.8*    Radiology Studies:  Dg Chest 2 View  04/27/2012  *RADIOLOGY REPORT*  Clinical Data: Shortness of breath, breast cancer  CHEST - 2 VIEW  Comparison: 02/22/2012  Findings: Lungs are essentially clear. No pleural effusion or pneumothorax.  The heart is top normal in size.  Mild degenerative changes of the visualized thoracolumbar spine.  Right chest power port.  IMPRESSION: No evidence of acute cardiopulmonary disease.   Original Report Authenticated By: Charline Bills, M.D.    Dg Chest Port 1 View  05/10/2012  *RADIOLOGY REPORT*  Clinical Data: Neutropenic fever  PORTABLE CHEST - 1 VIEW   Comparison:  04/27/2012  Findings: Cardiomediastinal silhouette is stable.  Right Port-A- Cath is unchanged in position.  No acute infiltrate or pleural effusion.  No pulmonary edema.  IMPRESSION: No active disease.  No significant change.   Original Report Authenticated By: Natasha Mead, M.D.        ASSESSMENT AND PLAN:   1. 62 y/o female with stage 1 Her-2positive ER/PR negative breast cancer in the left breast diagnosed October 2013. neoadjuvant chemotherapy consisting of Taxotere/carboplatinum/Herceptin. She began cycle #1 on 02/29/2012. She received Taxotere  and carboplatinum every 21 days, 4 of 4  on 05/04/2012,  with Herceptin given weekly, D1C12 on 1/15, with planned Herceptin for 1/22 pending on health status and counts. She completed  a total of 4 cycles of Taxotere and Carboplatinum.  Further Chemo plans as per Dr. Welton Flakes 2. Neutropenic Fever : Fever resolved, WBC recovering slowly. Will monitor.  3. Thrombocytopenia, in the setting of acute illness, polypharmacy, dilution and recent chemo, will monitor. No acute bleeding issues. 4. Anemia,multifactorial  s/p 3 units of blood, stable. 5. Full Code  6. Other medical issues as per admitting team.       Maria Mckinney 05/17/2012, 2:21 PM   ATTENDING'S ATTESTATION:  I personally reviewed patient's chart, examined patient myself, formulated the treatment plan as followed.    Agree with above note  Drue Second, MD Medical/Oncology [facility] [phone] (beeper) [phone] (Office)  05/18/2012, 11:38 AM

## 2012-05-17 NOTE — Progress Notes (Signed)
PT Cancellation Note  Patient Details Name: Maria Mckinney MRN: [id number] DOB: [birth year]   Cancelled Treatment:    Reason Eval/Treat Not Completed: Medical issues which prohibited therapy;Other (comment) (pt reports she has not been feeling well today)   Donnetta Hail 05/17/2012, 3:21 PM

## 2012-05-17 NOTE — Progress Notes (Addendum)
[facility] PROGRESS NOTE  Maria Mckinney OZH:[id number] DOB: 1950-09-04 DOA: 05/10/2012 PCP: [name], MD  Brief narrative: 62 year old female with past medical history of breast cancer undergoing chemotherapy who presented to [place] [facility] 05/10/2012 for evaluation of fever, chills and nausea. Patient was doing well until one day prior to admission when she went for her chemotherapy session. Patient has completed her chemotherapy session without any problems but then she started feeling feverish, fever 100.5 at home associated with chills. Patient also had intermittent nausea associated with generalized weakness.  In ED, patient was found to be severely neutropenic with a white blood cell count is 0.7. In addition patient had hypokalemia, hyponatremia and thrombocytopenia. Patient was subsequently admitted for neutropenic fever.   Assessment and plan:   Principal Problem:  *Sepsis, urinary tract infection  Sepsis secondary to urinary tract infection secondary to Escherichia coli  Calcitonin elevated at 1.37  For now we will continue Zosyn until sensitivity report available  Patient remains afebrile since the admission but continues to have neutropenia  Continue IV fluids, normal saline with addition of potassium in IV fluids  Continue antiemetics as needed  Appreciate oncology following   Active Problems:  Cancer of upper-outer quadrant of female breast  Appreciate oncology following and their recommendations  Patient is s/p port-a-cath placement for neoadjuvant chemotherapy consisting of Taxotere/carboplatinum/Herceptin. She began cycle #1 on 02/29/2012 and has completed 4 of 4 cycles of Taxotere and carboplatinum on 1/3//2014, with Herceptin given weekly, last dose on 05/16/2013, due for next Herceptin on 1/22 Pancytopenia  This is likely secondary to sequela of chemotherapy  White blood cell count slowly trending up, 1.3 today  Hemoglobin dropped to 7.1. We transfused 1 unit  of PRBC 05/16/2012. Please note that patient was already transfuse 2 units of PRBCs 05/14/2012. 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 No acute overnight events.  Objective: Filed Vitals:   05/17/12 0005 05/17/12 0049 05/17/12 0553 05/17/12 1347  BP: 142/62 151/61 147/71 158/68  Pulse: 101 102 106 102  Temp: 98.1 F (36.7 C) 98 F (36.7 C) 98.1 F (36.7 C) 98.3 F (36.8 C)  TempSrc: Oral Oral Oral Oral  Resp: 12 14 15 18   Height:      Weight:      SpO2:   97% 98%    Intake/Output Summary (Last 24 hours) at 05/17/12 1909 Last data filed at 05/17/12 1700  Gross per 24 hour  Intake   2670 ml  Output   1875 ml  Net    795 ml    Exam:   General:  Pt is alert, follows commands appropriately, not in acute distress  Cardiovascular: Regular rate and rhythm, S1/S2, no murmurs, no rubs, no gallops  Respiratory: Clear to auscultation bilaterally, no wheezing, no crackles, no rhonchi  Abdomen: Soft, non tender, non distended, bowel sounds present, no guarding  Extremities: No edema, pulses DP and PT palpable bilaterally  Neuro: Grossly nonfocal  Data Reviewed: Basic Metabolic Panel:  Lab 05/17/12 5621 05/16/12 0550 05/15/12 0510 05/14/12 1916  05/14/12 0450 05/13/12 0400 05/10/12 2359  NA 132* 130* 130* 128* 126* -- --  K 2.6* 2.7* 2.4* 3.2* 2.4* -- --  CL 95* 96 95* 96 94* -- --  CO2 21 21 21 21 21  -- --  GLUCOSE 80 91 84 78 87 -- --  BUN 8 8 10 11 11  -- --  CREATININE 1.07 1.11* 1.12* 1.07 1.14* -- --  CALCIUM 8.5 8.0* 7.9* 8.2* 8.0* -- --  MG 1.6 -- 1.1* -- -- 2.1 0.9*  PHOS -- -- -- -- -- -- --   Liver Function Tests: No results found for this basename: AST:5,ALT:5,ALKPHOS:5,BILITOT:5,PROT:5,ALBUMIN:5 in the last 168 hours No results found for this basename: LIPASE:5,AMYLASE:5 in the last 168 hours No results found for this basename: AMMONIA:5 in the last 168 hours CBC:  Lab 05/17/12 0525 05/16/12 0550 05/15/12 0510 05/14/12 1915 05/14/12 0450 05/13/12 0400  WBC 1.3* 1.0* 0.7* -- 0.9* 0.7*  NEUTROABS -- 0.4* -- -- -- --  HGB 10.1* 7.1* 8.2* 8.6* 6.7* --  HCT 27.6* 20.2* 22.7* 23.5* 18.8* --  MCV 84.7 86.3 86.6 -- 86.2 85.9  PLT 29* 27* 23* -- 32* 39*   Cardiac Enzymes: No results found for this basename: CKTOTAL:5,CKMB:5,CKMBINDEX:5,TROPONINI:5 in the last 168 hours BNP: No components found with this basename: POCBNP:5 CBG: No results found for this basename: GLUCAP:5 in the last 168 hours  Recent Results (from the past 240 hour(s))  CULTURE, BLOOD (ROUTINE X 2)     Status: Normal   Collection Time   05/10/12  7:00 PM      Component Value Range Status Comment   Specimen Description BLOOD PORT   Final    Special Requests BOTTLES DRAWN AEROBIC AND ANAEROBIC 5CC EACH   Final    Culture  Setup Time 05/11/2012 02:16   Final    Culture NO GROWTH 5 DAYS   Final    Report Status 05/17/2012 FINAL   Final   CULTURE, BLOOD (ROUTINE X 2)     Status: Normal   Collection Time   05/10/12  7:15 PM      Component Value Range Status Comment   Specimen Description BLOOD RIGHT ARM   Final    Special Requests BOTTLES DRAWN AEROBIC AND ANAEROBIC 5CC EACH   Final    Culture  Setup Time 05/11/2012 02:16   Final    Culture NO  GROWTH 5 DAYS   Final    Report Status 05/17/2012 FINAL   Final   URINE CULTURE     Status: Normal   Collection Time   05/10/12  8:11 PM      Component Value Range Status Comment   Specimen Description URINE, CLEAN CATCH   Final    Special Requests NONE   Final    Culture  Setup Time 05/11/2012 02:42   Final    Colony Count 50,000 COLONIES/ML   Final    Culture ESCHERICHIA COLI   Final    Report Status 05/12/2012 FINAL   Final    Organism ID, Bacteria ESCHERICHIA COLI   Final   MRSA PCR SCREENING     Status: Normal   Collection Time   05/11/12  1:22 AM      Component Value Range Status Comment  MRSA by PCR NEGATIVE  NEGATIVE Final   CLOSTRIDIUM DIFFICILE BY PCR     Status: Normal   Collection Time   05/17/12  5:48 AM      Component Value Range Status Comment   C difficile by pcr NEGATIVE  NEGATIVE Final      Studies: No results found.  Scheduled Meds:   . DULoxetine  60 mg Oral Daily  . levothyroxine  100 mcg Oral Daily  . pantoprazole  80 mg Oral Q1200  . piperacillin-tazobactam (ZOSYN)  IV  3.375 g Intravenous Q8H  . potassium chloride  40 mEq Oral BID  . sodium chloride  500 mL Intravenous Once  . sodium chloride  3 mL Intravenous Q12H   Continuous Infusions:   . 0.9 % NaCl with KCl 20 mEq / L 75 mL/hr at 05/17/12 1527

## 2012-05-18 DIAGNOSIS — D696 Thrombocytopenia, unspecified: Secondary | ICD-10-CM

## 2012-05-18 LAB — BASIC METABOLIC PANEL
Calcium: 8.2 mg/dL — ABNORMAL LOW (ref 8.4–10.5)
Chloride: 100 mEq/L (ref 96–112)
Creatinine, Ser: 1.06 mg/dL (ref 0.50–1.10)
GFR calc Af Amer: 63 mL/min — ABNORMAL LOW (ref >90)

## 2012-05-18 LAB — TYPE AND SCREEN
Unit division: 0
Unit division: 0

## 2012-05-18 LAB — CBC
Hemoglobin: 8.9 g/dL — ABNORMAL LOW (ref 12.0–15.0)
MCV: 85.8 fL (ref 78.0–100.0)
Platelets: 25 10*3/uL — CL (ref 150–400)
RBC: 2.89 MIL/uL — ABNORMAL LOW (ref 3.87–5.11)
RDW: 18.1 % — ABNORMAL HIGH (ref 11.5–15.5)

## 2012-05-18 MED ORDER — DEXTROSE 5 % IV SOLN
480.0000 ug | Freq: Every day | INTRAVENOUS | Status: DC
  Administered 2012-05-18 – 2012-05-19 (×2): 480 ug via INTRAVENOUS
  Filled 2012-05-18 (×2): qty 1.6

## 2012-05-18 MED ORDER — DEXTROSE 5 % IV SOLN
1.0000 g | INTRAVENOUS | Status: DC
  Administered 2012-05-18 – 2012-05-20 (×3): 1 g via INTRAVENOUS
  Filled 2012-05-18 (×4): qty 10

## 2012-05-18 NOTE — Progress Notes (Signed)
INITIAL NUTRITION ASSESSMENT  Pt meets criteria for severe MALNUTRITION in the context of chronic illness as evidenced by <50% estimated energy intake with 12% weight loss in the past 3 months.  DOCUMENTATION CODES Per approved criteria  -Severe malnutrition in the context of chronic illness -Obesity Unspecified   INTERVENTION: - Had long discussion with pt and husband about strategies to help with altered taste from chemotherapy. Provided handouts of this information. Explained high calorie/protein nutrition therapy to help pt regain strength and energy. Provided pt with contact information of [employer] [address]. Assisted pt with ordering meals.  - Recommend MD continue to monitor and replace pt's potassium, magnesium, and phosphorus as pt at risk for refeeding syndrome - Will continue to monitor   NUTRITION DIAGNOSIS: Inadequate oral intake related to changes in taste from chemotherapy as evidenced by pt statement.   Goal: Pt to consume >90% of meals  Monitor:  Weights, labs, intake  Reason for Assessment: Nutrition risk   62 y.o. female  Admitting Dx: Neutropenia  ASSESSMENT: Pt with stage 1 breast CA s/p 4 cycles of chemotherapy. Pt reports she has only been consuming 1/2 cup of soup/day since September 2013. Pt has lost 25 pounds unintentionally in the past 3 months. Pt reports she used to drink Boost but then one day she lost the taste for it. Pt c/o metallic taste and that foods don't taste good anymore. Pt reports she only drinks water. Noted pt with low potassium and magnesium earlier in the week, pt at risk for refeeding syndrome from severe malnutrition.   Height: Ht Readings from Last 1 Encounters:  05/11/12 5\' 2"  (1.575 m)    Weight: Wt Readings from Last 1 Encounters:  05/11/12 182 lb 8.7 oz (82.8 kg)    Ideal Body Weight: 110 lb  % Ideal Body Weight: 165  Wt Readings from Last 10 Encounters:  05/11/12 182 lb 8.7 oz (82.8 kg)  05/09/12 177 lb 8  oz (80.513 kg)  05/03/12 187 lb 11.2 oz (85.14 kg)  04/26/12 187 lb 8 oz (85.049 kg)  04/17/12 186 lb 12.8 oz (84.732 kg)  04/11/12 195 lb 8 oz (88.678 kg)  04/04/12 192 lb (87.091 kg)  03/30/12 192 lb (87.091 kg)  03/27/12 191 lb 4.8 oz (86.773 kg)  03/21/12 201 lb 1.6 oz (91.218 kg)    Usual Body Weight: 207 lb in October 2013  % Usual Body Weight: 88  BMI:  Body mass index is 33.39 kg/(m^2).  Estimated Nutritional Needs: Kcal: 1500-1800 Protein: 50-60g Fluid: 1.5-1.8L/day   Diet Order: General  EDUCATION NEEDS: -Education needs addressed   Intake/Output Summary (Last 24 hours) at 05/18/12 1756 Last data filed at 05/18/12 1743  Gross per 24 hour  Intake   1004 ml  Output   2903 ml  Net  -1899 ml    Last BM: 1/17  Labs:   Lab 05/18/12 0515 05/17/12 0525 05/16/12 0550 05/15/12 0510 05/13/12 0400  NA 133* 132* 130* -- --  K 3.3* 2.6* 2.7* -- --  CL 100 95* 96 -- --  CO2 22 21 21  -- --  BUN 7 8 8  -- --  CREATININE 1.06 1.07 1.11* -- --  CALCIUM 8.2* 8.5 8.0* -- --  MG -- 1.6 -- 1.1* 2.1  PHOS -- -- -- -- --  GLUCOSE 78 80 91 -- --    CBG (last 3)  No results found for this basename: GLUCAP:3 in the last 72 hours  Scheduled Meds:   .  cefTRIAXone (ROCEPHIN)  IV  1 g Intravenous Q24H  . DULoxetine  60 mg Oral Daily  . filgrastim (NEUPOGEN) IV  480 mcg Intravenous q1800  . levothyroxine  100 mcg Oral Daily  . pantoprazole  80 mg Oral Q1200  . potassium chloride  40 mEq Oral BID  . sodium chloride  500 mL Intravenous Once  . sodium chloride  3 mL Intravenous Q12H    Continuous Infusions:   . 0.9 % NaCl with KCl 20 mEq / L 75 mL/hr at 05/18/12 1610    Past Medical History  Diagnosis Date  . HTN (hypertension)   . Fibromyalgia   . GERD (gastroesophageal reflux disease)   . Arthritis   . Anxiety   . Dysrhythmia     irreg hr  . Wears hearing aid     left ear  . 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 PONV (postoperative nausea and vomiting)      Past Surgical History  Procedure Date  . Appendectomy   . Combined hysteroscopy diagnostic / d&c   . Cochlear implant   . Portacath placement 02/22/2012    Procedure: INSERTION PORT-A-CATH;  Surgeon: [name], MD;  Location: [facility];  Service: General;  Laterality: Right;    Levon Hedger MS, RD, LDN [phone] Pager [phone] After Hours Pager

## 2012-05-18 NOTE — Progress Notes (Signed)
Physical Therapy Treatment Patient Details Name: Maria Mckinney MRN: [id number] DOB: Jun 24, 1950 Today's Date: 05/18/2012 Time: 4098-1191 PT Time Calculation (min): 26 min  PT Assessment / Plan / Recommendation Comments on Treatment Session  Pt under going Tx for Breat CA and was admitted for fever, chills and nausea.  Pt motivated and pleasant.  Assisted OOB to amb to BR then amb in hallway.  No c/o pain.  Demon mild unsteady gait but declined the use of a walker.  She preferd to push her IV pole and stated "I walk better with mt easy spirits".      Follow Up Recommendations  Home health PT     Does the patient have the potential to tolerate intense rehabilitation     Barriers to Discharge        Equipment Recommendations  Rolling walker with 5" wheels (if spouse unable to locate her RW)    Recommendations for Other Services    Frequency Min 3X/week   Plan Discharge plan remains appropriate    Precautions / Restrictions Precautions Precautions: Fall Restrictions Weight Bearing Restrictions: No   Pertinent Vitals/Pain No c/o pain    Mobility  Bed Mobility Bed Mobility: Supine to Sit;Sit to Supine Supine to Sit: 6: Modified independent (Device/Increase time) Sit to Supine: 6: Modified independent (Device/Increase time) Details for Bed Mobility Assistance: increased time Transfers Transfers: Sit to Stand;Stand to Sit Sit to Stand: 5: Supervision;4: Min guard;From bed;From toilet Stand to Sit: 5: Supervision;4: Min guard;To bed;To toilet Details for Transfer Assistance: <25% VC's on safety with turns in reguard to IV lines Ambulation/Gait Ambulation/Gait Assistance: 4: Min guard Ambulation Distance (Feet): 185 Feet Assistive device:  (pushing IV pole with B UE's, pt declined use of RW.) Ambulation/Gait Assistance Details: Pt stated "I walk better with my easy spirits" when I committed on her unsteadyness and asked her if she would like to try the walker. Gait Pattern:  Step-through pattern;Decreased stride length;Wide base of support Gait velocity: decreased     PT Goals                                             progressing    Visit Information  Last PT Received On: 05/18/12 Assistance Needed: +1    Subjective Data  Subjective: I need to use the bathroom first   Cognition    good   Balance   fair  End of Session PT - End of Session Equipment Utilized During Treatment: Gait belt Activity Tolerance: Patient tolerated treatment well Patient left: in bed;with call bell/phone within reach;with nursing in room   Felecia Shelling  PTA [facility]  Acute  Rehab Pager     [phone]

## 2012-05-18 NOTE — Progress Notes (Signed)
ANTIBIOTIC CONSULT NOTE - FOLLOW UP  Pharmacy Consult for Zosyn Indication: neutropenia, UTI  Allergies  Allergen Reactions  . Acyclovir And Related   . Ciprofloxacin Hcl Rash  . Bactrim (Sulfamethoxazole W-Trimethoprim)    Patient Measurements: Height: 5\' 2"  (157.5 cm) Weight: 182 lb 8.7 oz (82.8 kg) IBW/kg (Calculated) : 50.1   Vital Signs: Temp: 98.6 F (37 C) (01/17 0542) Temp src: Oral (01/17 0542) BP: 156/80 mmHg (01/17 0542) Pulse Rate: 104  (01/17 0542) Intake/Output from previous day: 01/16 0701 - 01/17 0700 In: 2444 [P.O.:840; I.V.:1604] Out: 2375 [Urine:2375]  Labs:  [facility] 05/18/12 0515 05/17/12 0525 05/16/12 0550  WBC 1.0* 1.3* 1.0*  HGB 8.9* 10.1* 7.1*  PLT 25* 29* 27*  LABCREA -- -- --  CREATININE 1.06 1.07 1.11*   Estimated Creatinine Clearance: 54.2 ml/min (by C-G formula based on Cr of 1.06). No results found for this basename: VANCOTROUGH:2,VANCOPEAK:2,VANCORANDOM:2,GENTTROUGH:2,GENTPEAK:2,GENTRANDOM:2,TOBRATROUGH:2,TOBRAPEAK:2,TOBRARND:2,AMIKACINPEAK:2,AMIKACINTROU:2,AMIKACIN:2, in the last 72 hours   Microbiology: Recent Results (from the past 720 hour(s))  CULTURE, BLOOD (ROUTINE X 2)     Status: Normal   Collection Time   05/10/12  7:00 PM      Component Value Range Status Comment   Specimen Description BLOOD PORT   Final    Special Requests BOTTLES DRAWN AEROBIC AND ANAEROBIC 5CC EACH   Final    Culture  Setup Time 05/11/2012 02:16   Final    Culture NO GROWTH 5 DAYS   Final    Report Status 05/17/2012 FINAL   Final   CULTURE, BLOOD (ROUTINE X 2)     Status: Normal   Collection Time   05/10/12  7:15 PM      Component Value Range Status Comment   Specimen Description BLOOD RIGHT ARM   Final    Special Requests BOTTLES DRAWN AEROBIC AND ANAEROBIC 5CC EACH   Final    Culture  Setup Time 05/11/2012 02:16   Final    Culture NO GROWTH 5 DAYS   Final    Report Status 05/17/2012 FINAL   Final   URINE CULTURE     Status: Normal   Collection  Time   05/10/12  8:11 PM      Component Value Range Status Comment   Specimen Description URINE, CLEAN CATCH   Final    Special Requests NONE   Final    Culture  Setup Time 05/11/2012 02:42   Final    Colony Count 50,000 COLONIES/ML   Final    Culture ESCHERICHIA COLI   Final    Report Status 05/12/2012 FINAL   Final    Organism ID, Bacteria ESCHERICHIA COLI   Final   MRSA PCR SCREENING     Status: Normal   Collection Time   05/11/12  1:22 AM      Component Value Range Status Comment   MRSA by PCR NEGATIVE  NEGATIVE Final   CLOSTRIDIUM DIFFICILE BY PCR     Status: Normal   Collection Time   05/17/12  5:48 AM      Component Value Range Status Comment   C difficile by pcr NEGATIVE  NEGATIVE Final    Assessment:  61 YOF admit 1/9 with neutropenic fever. S/p Chemo 05/03/12, herceptin weekly.   Day #9 Zosyn.  Ecoli in urine sensitive to Rocephin, nitrofurantoin, Zosyn, Septra.  Remains neutropenic. Neulasta given on 05/04/12.   SCr stable.  Goal of Therapy:  Appropriate abx dosing, eradication of infection.  Plan:   Cont Zosyn 3.375g IV Q8H infused over  4hrs for now - Recommend narrow to cover only Ecoli UTI with something like Rocephin or Ceftin vs stopping antibiotics.   F/u daily.   Charolotte Eke, PharmD, pager [phone]. 05/18/2012,12:47 PM.

## 2012-05-18 NOTE — Progress Notes (Signed)
[facility]  [phone]    HOSPITAL PROGRESS NOTE  Events since           noted.    MEDICATIONS:    . cefTRIAXone (ROCEPHIN)  IV  1 g Intravenous Q24H  . DULoxetine  60 mg Oral Daily  . filgrastim (NEUPOGEN) IV  480 mcg Intravenous q1800  . levothyroxine  100 mcg Oral Daily  . pantoprazole  80 mg Oral Q1200  . potassium chloride  40 mEq Oral BID  . sodium chloride  500 mL Intravenous Once  . sodium chloride  3 mL Intravenous Q12H    ALLERGIES:   Allergies  Allergen Reactions  . Acyclovir And Related   . Ciprofloxacin Hcl Rash  . Bactrim (Sulfamethoxazole W-Trimethoprim)      PHYSICAL EXAMINATION:   Filed Vitals:   05/18/12 0542  BP: 156/80  Pulse: 104  Temp: 98.6 F (37 C)  Resp: 2     62 year old in no acute distress A. and O. x3  General well-developed and well-nourished Alopecia.  HEENT: Normocephalic, atraumatic, PERRLA. Oral cavity without thrush or lesions.  Neck supple. no thyromegaly, no cervical or supraclavicular adenopathy  Lungs clear bilaterally . No wheezing, rhonchi or rales.  Cardiac: regular rate and rhythm,no murmur , rubs or gallops  Abdomen soft nontender , bowel sounds x4. No HSM  Extremities no clubbing cyanosis, trace edema bilaterally. No petechial rash  Neuro: non focal  LABORATORY/RADIOLOGY DATA:   Lab 05/18/12 0515 05/17/12 0525 05/16/12 0550 05/15/12 0510 05/14/12 1915 05/14/12 0450  WBC 1.0* 1.3* 1.0* 0.7* -- 0.9*  HGB 8.9* 10.1* 7.1* 8.2* 8.6* --  HCT 24.8* 27.6* 20.2* 22.7* 23.5* --  PLT 25* 29* 27* 23* -- 32*  MCV 85.8 84.7 86.3 86.6 -- 86.2  MCH 30.8 31.0 30.3 31.3 -- 30.7  MCHC 35.9 36.6* 35.1 36.1* -- 35.6  RDW 18.1* 17.4* 17.8* 17.8* -- 18.7*  LYMPHSABS -- -- 0.5* 0.0* -- --  MONOABS -- -- 0.1 0.0* -- --  EOSABS -- -- 0.0 0.0 -- --  BASOSABS -- -- 0.0 0.0 -- --  BANDABS -- -- -- -- -- --    CMP    Lab 05/18/12 0515 05/17/12 0525 05/16/12 0550 05/15/12 0510 05/14/12 1916  05/13/12 0400  NA 133* 132* 130* 130* 128* --  K 3.3* 2.6* 2.7* 2.4* 3.2* --  CL 100 95* 96 95* 96 --  CO2 22 21 21 21 21  --  GLUCOSE 78 80 91 84 78 --  BUN 7 8 8 10 11  --  CREATININE 1.06 1.07 1.11* 1.12* 1.07 --  CALCIUM 8.2* 8.5 8.0* 7.9* 8.2* --  MG -- 1.6 -- 1.1* -- 2.1  AST -- -- -- -- -- --  ALT -- -- -- -- -- --  ALKPHOS -- -- -- -- -- --  BILITOT -- -- -- -- -- --        Component Value Date/Time   BILITOT 3.9* 05/10/2012 1909   BILITOT 2.42* 05/09/2012 0939      Radiology Studies:  Dg Chest 2 View  04/27/2012  *RADIOLOGY REPORT*  Clinical Data: Shortness of breath, breast cancer  CHEST - 2 VIEW  Comparison: 02/22/2012  Findings: Lungs are essentially clear. No pleural effusion or pneumothorax.  The heart is top normal in size.  Mild degenerative changes of the visualized thoracolumbar spine.  Right chest power port.  IMPRESSION: No evidence of acute cardiopulmonary disease.   Original Report Authenticated By: Charline Bills, M.D.  Dg Chest Port 1 View  05/10/2012  *RADIOLOGY REPORT*  Clinical Data: Neutropenic fever  PORTABLE CHEST - 1 VIEW  Comparison:  04/27/2012  Findings: Cardiomediastinal silhouette is stable.  Right Port-A- Cath is unchanged in position.  No acute infiltrate or pleural effusion.  No pulmonary edema.  IMPRESSION: No active disease.  No significant change.   Original Report Authenticated By: Natasha Mead, M.D.        ASSESSMENT AND PLAN:   1. 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 She completed 4 of 4 cycles of Taxotere and carboplatinum every 21 days on 1/3//2014, with Herceptin given weekly, last dose on 05/16/2013, due for more Herceptin on 1/22 2. neutropenic fever, resolved 3.Anemia, stable s/p 3 units of blood 4. Thrombocytopenia in the setting of acute illness, polypharmacy, dilution and recent chemo, will monitor. No acute bleeding  issues. Will need to recheck labs 5.Neutropenia,  Neupogen 480 mcg today. Last Neulasta on 05/04/12.  monitor closely.  6. E. Coli UTI, currently on Rocephin and Zosyn 7. Diarrhea, resolved 8. Full Code Other medical issues as per admitting team      [facility] E 05/18/2012, 2:17 PM

## 2012-05-18 NOTE — Progress Notes (Signed)
[facility] PROGRESS NOTE  Maria Mckinney RUE:[id number] DOB: 1951/01/26 DOA: 05/10/2012 PCP: [name], MD  Brief narrative: 62 year old female with PMH of breast cancer undergoing chemotherapy who presented to [facility] long 05/10/2012 for evaluation of fever, chills and nausea. Patient was doing well until one day prior to admission when she went for her chemotherapy session. Patient completed her chemotherapy session without any problems but then she started feeling feverish, fever 100.5 at home associated with chills. Patient also had intermittent nausea associated with generalized weakness. In ED, patient was found to be severely neutropenic with a white blood cell count is 0.7. In addition patient had hypokalemia, hyponatremia and thrombocytopenia. Patient was subsequently admitted for neutropenic fever.   Assessment/Plan: Principal Problem: *Febrile neutropenia / sepsis / urinary tract infection  Sepsis from a urinary tract infection secondary to Escherichia coli.  Calcitonin elevated at 1.37.  Initially treated with Zosyn, and narrow antibiotics to ceftriaxone based on sensitivity data.  Patient remains afebrile since the admission but continues to have neutropenia. We'll start Neupogen to assist and count recovery.   Active Problems:  Cancer of upper-outer quadrant of female breast   Appreciate oncology following and their recommendations.  Patient is s/p port-a-cath placement for neoadjuvant chemotherapy consisting of Taxotere/carboplatinum/Herceptin. She began cycle #1 on 02/29/2012 and has completed 4 of 4 cycles of Taxotere and carboplatinum on 1/3//2014, with Herceptin given weekly, last dose on 05/16/2013, due for next Herceptin on 05/23/12. Pancytopenia   This is likely secondary to sequela of chemotherapy.   Patient has received a total of 2 units of packed red blood cells during this admission.  Platelet count slowly trending up.   Continue to monitor CBC  daily. GERD (gastroesophageal reflux disease)   Continue Protonix. Hypertension   Systolic blood pressure slightly high. Not on routine antihypertensives at home. Will defer to primary care physician regarding whether or not this needs treatment. Acute kidney injury   Mild, creatinine trending down. 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  Hypokalemia   Continue NS with IV potassium in fluids and oral potassium replacement.   Check magnesium in the morning.  Code Status: Full  Family Communication: Family not at bedside  Disposition Plan: Home when stable.   Medical Consultants:  Dr. Drue Second, Oncology  Anti-infectives:  Rocephin 05/18/2012--->  Zosyn 05/10/2012---> 05/18/2012  Vancomycin 05/10/2012--> 05/10/2012  Tamiflu 05/10/2012---> 05/10/2012  HPI/Subjective: Maria Mckinney says that her appetite is beginning to pick up. She had 2 loose stools today. No complaints of pain. No significant nausea or vomiting. No blood in the stool. Feels a bit weak.  Objective: Filed Vitals:   05/17/12 0553 05/17/12 1347 05/17/12 2120 05/18/12 0542  BP: 147/71 158/68 150/73 156/80  Pulse: 106 102 106 104  Temp: 98.1 F (36.7 C) 98.3 F (36.8 C) 97.9 F (36.6 C) 98.6 F (37 C)  TempSrc: Oral Oral Oral Oral  Resp: 15 18 12 16   Height:      Weight:      SpO2: 97% 98% 99% 97%    Intake/Output Summary (Last 24 hours) at 05/18/12 1343 Last data filed at 05/18/12 0900  Gross per 24 hour  Intake   1964 ml  Output   2325 ml  Net   -361 ml    Exam: Gen:  NAD Cardiovascular:  RRR, No M/R/G Respiratory:  Lungs  diminished  Gastrointestinal:  Abdomen softly distended, NT/ND, + BS Extremities: Trace edema  Data Reviewed: Basic Metabolic Panel:  Lab 05/18/12  1610 05/17/12 0525 05/16/12 0550 05/15/12 0510 05/14/12 1916 05/13/12 0400  NA 133* 132* 130* 130* 128* --  K 3.3* 2.6* -- -- -- --  CL 100 95* 96 95* 96 --  CO2 22 21 21 21 21  --  GLUCOSE 78  80 91 84 78 --  BUN 7 8 8 10 11  --  CREATININE 1.06 1.07 1.11* 1.12* 1.07 --  CALCIUM 8.2* 8.5 8.0* 7.9* 8.2* --  MG -- 1.6 -- 1.1* -- 2.1  PHOS -- -- -- -- -- --   GFR Estimated Creatinine Clearance: 54.2 ml/min (by C-G formula based on Cr of 1.06).  CBC:  Lab 05/18/12 0515 05/17/12 0525 05/16/12 0550 05/15/12 0510 05/14/12 1915 05/14/12 0450  WBC 1.0* 1.3* 1.0* 0.7* -- 0.9*  NEUTROABS -- -- 0.4* -- -- --  HGB 8.9* 10.1* 7.1* 8.2* 8.6* --  HCT 24.8* 27.6* 20.2* 22.7* 23.5* --  MCV 85.8 84.7 86.3 86.6 -- 86.2  PLT 25* 29* 27* 23* -- 32*   Microbiology Recent Results (from the past 240 hour(s))  CULTURE, BLOOD (ROUTINE X 2)     Status: Normal   Collection Time   05/10/12  7:00 PM      Component Value Range Status Comment   Specimen Description BLOOD PORT   Final    Special Requests BOTTLES DRAWN AEROBIC AND ANAEROBIC 5CC EACH   Final    Culture  Setup Time 05/11/2012 02:16   Final    Culture NO GROWTH 5 DAYS   Final    Report Status 05/17/2012 FINAL   Final   CULTURE, BLOOD (ROUTINE X 2)     Status: Normal   Collection Time   05/10/12  7:15 PM      Component Value Range Status Comment   Specimen Description BLOOD RIGHT ARM   Final    Special Requests BOTTLES DRAWN AEROBIC AND ANAEROBIC 5CC EACH   Final    Culture  Setup Time 05/11/2012 02:16   Final    Culture NO GROWTH 5 DAYS   Final    Report Status 05/17/2012 FINAL   Final   URINE CULTURE     Status: Normal   Collection Time   05/10/12  8:11 PM      Component Value Range Status Comment   Specimen Description URINE, CLEAN CATCH   Final    Special Requests NONE   Final    Culture  Setup Time 05/11/2012 02:42   Final    Colony Count 50,000 COLONIES/ML   Final    Culture ESCHERICHIA COLI   Final    Report Status 05/12/2012 FINAL   Final    Organism ID, Bacteria ESCHERICHIA COLI   Final   MRSA PCR SCREENING     Status: Normal   Collection Time   05/11/12  1:22 AM      Component Value Range Status Comment   MRSA by PCR  NEGATIVE  NEGATIVE Final   CLOSTRIDIUM DIFFICILE BY PCR     Status: Normal   Collection Time   05/17/12  5:48 AM      Component Value Range Status Comment   C difficile by pcr NEGATIVE  NEGATIVE Final      Procedures and Diagnostic Studies: Dg Chest Port 1 View  05/10/2012  *RADIOLOGY REPORT*  Clinical Data: Neutropenic fever  PORTABLE CHEST - 1 VIEW  Comparison:  04/27/2012  Findings: Cardiomediastinal silhouette is stable.  Right Port-A- Cath is unchanged in position.  No acute infiltrate or pleural  effusion.  No pulmonary edema.  IMPRESSION: No active disease.  No significant change.   Original Report Authenticated By: Natasha Mead, M.D.     Scheduled Meds:    . DULoxetine  60 mg Oral Daily  . levothyroxine  100 mcg Oral Daily  . pantoprazole  80 mg Oral Q1200  . piperacillin-tazobactam (ZOSYN)  IV  3.375 g Intravenous Q8H  . potassium chloride  40 mEq Oral BID  . sodium chloride  500 mL Intravenous Once  . sodium chloride  3 mL Intravenous Q12H   Continuous Infusions:    . 0.9 % NaCl with KCl 20 mEq / L 75 mL/hr at 05/18/12 0509    Time spent: 25 minutes.   LOS: 8 days   Yaniel Limbaugh  [facility] Pager [phone].  If 8PM-8AM, please contact night-coverage at [email or web address], password [facility] 05/18/2012, 1:43 PM

## 2012-05-19 LAB — CBC WITH DIFFERENTIAL/PLATELET
Basophils Absolute: 0 10*3/uL (ref 0.0–0.1)
Eosinophils Absolute: 0 10*3/uL (ref 0.0–0.7)
Lymphs Abs: 0.5 10*3/uL — ABNORMAL LOW (ref 0.7–4.0)
MCH: 30.8 pg (ref 26.0–34.0)
MCV: 86.9 fL (ref 78.0–100.0)
Monocytes Absolute: 0.2 10*3/uL (ref 0.1–1.0)
Platelets: 29 10*3/uL — CL (ref 150–400)
RDW: 18.3 % — ABNORMAL HIGH (ref 11.5–15.5)
WBC: 1.6 10*3/uL — ABNORMAL LOW (ref 4.0–10.5)

## 2012-05-19 LAB — BASIC METABOLIC PANEL
Calcium: 8 mg/dL — ABNORMAL LOW (ref 8.4–10.5)
GFR calc Af Amer: 63 mL/min — ABNORMAL LOW (ref >90)
GFR calc non Af Amer: 55 mL/min — ABNORMAL LOW (ref >90)
Sodium: 133 mEq/L — ABNORMAL LOW (ref 135–145)

## 2012-05-19 MED ORDER — MAGNESIUM SULFATE 40 MG/ML IJ SOLN
4.0000 g | INTRAMUSCULAR | Status: AC
  Administered 2012-05-19: 4 g via INTRAVENOUS
  Filled 2012-05-19: qty 100

## 2012-05-19 NOTE — Progress Notes (Signed)
CRITICAL VALUE ALERT  Critical value received: Mg 0.8  Date of notification:  05/19/2012   Time of notification:  0650  Critical value read back: yes  Nurse who received alert:  Jilda Panda, RN  MD notified (1st page):  Elray Mcgregor, NP  Time of first page:  [phone]  MD notified (2nd page):  Time of second page:  Responding MD:  Elray Mcgregor, NP  Time MD responded:  [phone]  Order received for 4grams Magnesium IV

## 2012-05-19 NOTE — Progress Notes (Signed)
[facility] PROGRESS NOTE  Maria SIVERSON ZOX:[id number] DOB: [birth year] DOA: 05/10/2012 PCP: [name], MD  Brief narrative: 62 year old female with PMH of breast cancer undergoing chemotherapy who presented to [facility] long 05/10/2012 for evaluation of fever, chills and nausea. Patient was doing well until one day prior to admission when she went for her chemotherapy session. Patient completed her chemotherapy session without any problems but then she started feeling feverish, fever 100.5 at home associated with chills. Patient also had intermittent nausea associated with generalized weakness. In ED, patient was found to be severely neutropenic with a white blood cell count is 0.7. In addition patient had hypokalemia, hyponatremia and thrombocytopenia. Patient was subsequently admitted for neutropenic fever.   Assessment/Plan: Principal Problem: *Febrile neutropenia / sepsis / urinary tract infection  Sepsis from a urinary tract infection secondary to Escherichia coli.  Calcitonin elevated at 1.37.  Initially treated with Zosyn, now on ceftriaxone based on sensitivity data.  Patient remains afebrile since the admission but continues to have neutropenia. Continue Neupogen to assist with count recovery.  Neutrophil count 900 today. Active Problems:  Cancer of upper-outer quadrant of female breast   Patient is s/p port-a-cath placement for neoadjuvant chemotherapy consisting of Taxotere/carboplatinum/Herceptin. She began cycle #1 on 02/29/2012 and has completed 4 of 4 cycles of Taxotere and carboplatinum on 1/3//2014, with Herceptin given weekly, last dose on 05/16/2013, due for next Herceptin on 05/23/12. Pancytopenia   This is likely secondary to sequela of chemotherapy.   Patient has received a total of 2 units of packed red blood cells during this admission.  Platelet count slowly trending up.   Continue to monitor CBC daily. GERD (gastroesophageal reflux disease)    Continue Protonix. Hypertension   Systolic blood pressure slightly high. Not on routine antihypertensives at home. Will defer to primary care physician regarding whether or not this needs treatment. Acute kidney injury   Mild, creatinine now back to baseline values. 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  Hypokalemia / hypomagnesemia  Continue NS with IV potassium in fluids and oral potassium replacement.   4 g IV magnesium sulfate given 05/19/2012.  Code Status: Full  Family Communication: Family not at bedside  Disposition Plan: Home when stable.   Medical Consultants:  Dr. Drue Second, Oncology  Anti-infectives:  Rocephin 05/18/2012--->  Zosyn 05/10/2012---> 05/18/2012  Vancomycin 05/10/2012--> 05/10/2012  Tamiflu 05/10/2012---> 05/10/2012  HPI/Subjective: Maria Mckinney says she continues to feel poorly. Continues to report loose stools. No frank nausea, vomiting, or pain. Has some dysuria and feels like she is beginning to swell and her hands.  Objective: Filed Vitals:   05/18/12 0542 05/18/12 1443 05/18/12 2211 05/19/12 0616  BP: 156/80 145/72 157/75 156/79  Pulse: 104 110 108 102  Temp: 98.6 F (37 C) 98 F (36.7 C) 98 F (36.7 C) 97.9 F (36.6 C)  TempSrc: Oral Oral Oral Oral  Resp: 16 18 16 16   Height:      Weight:      SpO2: 97% 100% 99% 98%    Intake/Output Summary (Last 24 hours) at 05/19/12 1046 Last data filed at 05/19/12 8119  Gross per 24 hour  Intake 2242.5 ml  Output   1953 ml  Net  289.5 ml    Exam: Gen:  NAD Cardiovascular:  RRR, No M/R/G Respiratory:  Lungs  diminished  Gastrointestinal:  Abdomen softly distended, NT/ND, + BS Extremities: Trace edema lower extremities, right hand swollen  Data Reviewed: Basic Metabolic Panel:  Lab 05/19/12 1478  05/18/12 0515 05/17/12 0525 05/16/12 0550 05/15/12 0510 05/13/12 0400  NA 133* 133* 132* 130* 130* --  K 3.7 3.3* -- -- -- --  CL 100 100 95* 96 95* --  CO2  24 22 21 21 21  --  GLUCOSE 77 78 80 91 84 --  BUN 5* 7 8 8 10  --  CREATININE 1.06 1.06 1.07 1.11* 1.12* --  CALCIUM 8.0* 8.2* 8.5 8.0* 7.9* --  MG 0.8* -- 1.6 -- 1.1* 2.1  PHOS -- -- -- -- -- --   GFR Estimated Creatinine Clearance: 54.2 ml/min (by C-G formula based on Cr of 1.06).  CBC:  Lab 05/19/12 0600 05/18/12 0515 05/17/12 0525 05/16/12 0550 05/15/12 0510  WBC 1.6* 1.0* 1.3* 1.0* 0.7*  NEUTROABS 0.9* -- -- 0.4* --  HGB 8.9* 8.9* 10.1* 7.1* 8.2*  HCT 25.1* 24.8* 27.6* 20.2* 22.7*  MCV 86.9 85.8 84.7 86.3 86.6  PLT 29* 25* 29* 27* 23*   Microbiology Recent Results (from the past 240 hour(s))  CULTURE, BLOOD (ROUTINE X 2)     Status: Normal   Collection Time   05/10/12  7:00 PM      Component Value Range Status Comment   Specimen Description BLOOD PORT   Final    Special Requests BOTTLES DRAWN AEROBIC AND ANAEROBIC 5CC EACH   Final    Culture  Setup Time 05/11/2012 02:16   Final    Culture NO GROWTH 5 DAYS   Final    Report Status 05/17/2012 FINAL   Final   CULTURE, BLOOD (ROUTINE X 2)     Status: Normal   Collection Time   05/10/12  7:15 PM      Component Value Range Status Comment   Specimen Description BLOOD RIGHT ARM   Final    Special Requests BOTTLES DRAWN AEROBIC AND ANAEROBIC 5CC EACH   Final    Culture  Setup Time 05/11/2012 02:16   Final    Culture NO GROWTH 5 DAYS   Final    Report Status 05/17/2012 FINAL   Final   URINE CULTURE     Status: Normal   Collection Time   05/10/12  8:11 PM      Component Value Range Status Comment   Specimen Description URINE, CLEAN CATCH   Final    Special Requests NONE   Final    Culture  Setup Time 05/11/2012 02:42   Final    Colony Count 50,000 COLONIES/ML   Final    Culture ESCHERICHIA COLI   Final    Report Status 05/12/2012 FINAL   Final    Organism ID, Bacteria ESCHERICHIA COLI   Final   MRSA PCR SCREENING     Status: Normal   Collection Time   05/11/12  1:22 AM      Component Value Range Status Comment   MRSA by PCR  NEGATIVE  NEGATIVE Final   CLOSTRIDIUM DIFFICILE BY PCR     Status: Normal   Collection Time   05/17/12  5:48 AM      Component Value Range Status Comment   C difficile by pcr NEGATIVE  NEGATIVE Final      Procedures and Diagnostic Studies: None.   Scheduled Meds:    . cefTRIAXone (ROCEPHIN)  IV  1 g Intravenous Q24H  . DULoxetine  60 mg Oral Daily  . filgrastim (NEUPOGEN) IV  480 mcg Intravenous q1800  . levothyroxine  100 mcg Oral Daily  . pantoprazole  80 mg Oral Q1200  .  potassium chloride  40 mEq Oral BID  . sodium chloride  500 mL Intravenous Once  . sodium chloride  3 mL Intravenous Q12H   Continuous Infusions:    . 0.9 % NaCl with KCl 20 mEq / L 75 mL/hr at 05/18/12 1949    Time spent: 25 minutes.   LOS: 9 days   Maria Mckinney  [facility] Pager [phone].  If 8PM-8AM, please contact night-coverage at [email or web address], password [facility] 05/19/2012, 10:46 AM

## 2012-05-20 LAB — BASIC METABOLIC PANEL
Calcium: 8.3 mg/dL — ABNORMAL LOW (ref 8.4–10.5)
Creatinine, Ser: 1 mg/dL (ref 0.50–1.10)
GFR calc Af Amer: 68 mL/min — ABNORMAL LOW (ref >90)
GFR calc non Af Amer: 59 mL/min — ABNORMAL LOW (ref >90)

## 2012-05-20 LAB — PREPARE RBC (CROSSMATCH)

## 2012-05-20 LAB — CBC WITH DIFFERENTIAL/PLATELET
Basophils Absolute: 0 10*3/uL (ref 0.0–0.1)
Basophils Relative: 0 % (ref 0–1)
Lymphocytes Relative: 25 % (ref 12–46)
Lymphs Abs: 0.8 10*3/uL (ref 0.7–4.0)
MCV: 87.5 fL (ref 78.0–100.0)
Monocytes Relative: 13 % — ABNORMAL HIGH (ref 3–12)
Platelets: 39 10*3/uL — ABNORMAL LOW (ref 150–400)
RDW: 18.2 % — ABNORMAL HIGH (ref 11.5–15.5)
WBC Morphology: INCREASED
WBC: 3.2 10*3/uL — ABNORMAL LOW (ref 4.0–10.5)

## 2012-05-20 MED ORDER — ONDANSETRON HCL 4 MG PO TABS
4.0000 mg | ORAL_TABLET | Freq: Three times a day (TID) | ORAL | Status: DC
  Administered 2012-05-20 – 2012-05-21 (×3): 4 mg via ORAL
  Filled 2012-05-20 (×7): qty 1

## 2012-05-20 NOTE — Progress Notes (Signed)
CRITICAL VALUE ALERT  Critical value received:  HGB of 6.4  Date of notification:  05/20/2012  Time of notification:  0650  Critical value read back:yes  Nurse who received alert:  Cindra Eves RN,  MD notified (1st page):  Elray Mcgregor,  NP  Time of first page:  [phone]  MD notified (2nd page):  Time of second page:  Responding MD:  Elray Mcgregor,  NP  Time MD responded:  06:58

## 2012-05-20 NOTE — Progress Notes (Signed)
[facility] PROGRESS NOTE  Maria Mckinney ZOX:[id number] DOB: Mar 04, 1951 DOA: 05/10/2012 PCP: [name], MD  Brief narrative: 62 year old female with PMH of breast cancer undergoing chemotherapy who presented to [facility] long 05/10/2012 for evaluation of fever, chills and nausea. Patient was doing well until one day prior to admission when she went for her chemotherapy session. Patient completed her chemotherapy session without any problems but then she started feeling feverish, fever 100.5 at home associated with chills. Patient also had intermittent nausea associated with generalized weakness. In ED, patient was found to be severely neutropenic with a white blood cell count is 0.7. In addition patient had hypokalemia, hyponatremia and thrombocytopenia. Patient was subsequently admitted for neutropenic fever.   Assessment/Plan: Principal Problem: *Febrile neutropenia / sepsis / urinary tract infection  Sepsis from a urinary tract infection secondary to Escherichia coli.  Calcitonin elevated at 1.37.  Initially treated with Zosyn, now on ceftriaxone based on sensitivity data.  Patient remains afebrile since the admission but continues to have neutropenia. Absolute neutrophil count up to 2000 today, discontinue Neupogen. Active Problems:  Cancer of upper-outer quadrant of female breast   Patient is s/p port-a-cath placement for neoadjuvant chemotherapy consisting of Taxotere/carboplatinum/Herceptin. She began cycle #1 on 02/29/2012 and has completed 4 of 4 cycles of Taxotere and carboplatinum on 1/3//2014, with Herceptin given weekly, last dose on 05/16/2013, due for next Herceptin on 05/23/12. Pancytopenia   This is likely secondary to sequela of chemotherapy.   Patient has received a total of 2 units of packed red blood cells during this admission. Hemoglobin dropped to 6.91/19/2014, give an additional unit of blood.  Platelet count slowly trending up.   Continue to monitor CBC  daily. GERD (gastroesophageal reflux disease)   Continue Protonix. Hypertension   Systolic blood pressure slightly high. Not on routine antihypertensives at home. Will defer to primary care physician regarding whether or not this needs treatment. Acute kidney injury   Mild, creatinine now back to baseline values. 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  Hypokalemia / hypomagnesemia  Normalized with NS with IV potassium in fluids and oral potassium replacement.   4 g IV magnesium sulfate given 05/19/2012 for hypomagnesemia.  Code Status: Full  Family Communication: Family not at bedside  Disposition Plan: Home when stable.   Medical Consultants:  Dr. Drue Second, Oncology  Anti-infectives:  Rocephin 05/18/2012--->  Zosyn 05/10/2012---> 05/18/2012  Vancomycin 05/10/2012--> 05/10/2012  Tamiflu 05/10/2012---> 05/10/2012  HPI/Subjective: Maria Mckinney feels a little bit better. She still has a lot of nausea but no frank vomiting. Occasional loose stools. Still with poor appetite.  Objective: Filed Vitals:   05/19/12 0616 05/19/12 1407 05/19/12 2205 05/20/12 0631  BP: 156/79 165/86 156/78 146/72  Pulse: 102 113 110 97  Temp: 97.9 F (36.6 C) 97.4 F (36.3 C) 98.2 F (36.8 C) 98.2 F (36.8 C)  TempSrc: Oral Oral Oral Oral  Resp: 16 18 16 16   Height:      Weight:      SpO2: 98% 99% 97% 97%    Intake/Output Summary (Last 24 hours) at 05/20/12 0809 Last data filed at 05/19/12 1819  Gross per 24 hour  Intake      0 ml  Output   1650 ml  Net  -1650 ml    Exam: Gen:  NAD Cardiovascular:  RRR, No M/R/G Respiratory:  Lungs  diminished  Gastrointestinal:  Abdomen softly distended, NT/ND, + BS Extremities: Trace edema lower extremities, right hand swollen  Data Reviewed:  Basic Metabolic Panel:  Lab 05/20/12 1610 05/19/12 0600 05/18/12 0515 05/17/12 0525 05/16/12 0550 05/15/12 0510  NA 133* 133* 133* 132* 130* --  K 3.8 3.7 -- -- -- --  CL  100 100 100 95* 96 --  CO2 24 24 22 21 21  --  GLUCOSE 93 77 78 80 91 --  BUN 5* 5* 7 8 8  --  CREATININE 1.00 1.06 1.06 1.07 1.11* --  CALCIUM 8.3* 8.0* 8.2* 8.5 8.0* --  MG -- 0.8* -- 1.6 -- 1.1*  PHOS -- -- -- -- -- --   GFR Estimated Creatinine Clearance: 57.5 ml/min (by C-G formula based on Cr of 1).  CBC:  Lab 05/20/12 0550 05/19/12 0600 05/18/12 0515 05/17/12 0525 05/16/12 0550  WBC 3.2* 1.6* 1.0* 1.3* 1.0*  NEUTROABS 2.0 0.9* -- -- 0.4*  HGB 6.4* 8.9* 8.9* 10.1* 7.1*  HCT 18.2* 25.1* 24.8* 27.6* 20.2*  MCV 87.5 86.9 85.8 84.7 86.3  PLT 39* 29* 25* 29* 27*   Microbiology Recent Results (from the past 240 hour(s))  CULTURE, BLOOD (ROUTINE X 2)     Status: Normal   Collection Time   05/10/12  7:00 PM      Component Value Range Status Comment   Specimen Description BLOOD PORT   Final    Special Requests BOTTLES DRAWN AEROBIC AND ANAEROBIC 5CC EACH   Final    Culture  Setup Time 05/11/2012 02:16   Final    Culture NO GROWTH 5 DAYS   Final    Report Status 05/17/2012 FINAL   Final   CULTURE, BLOOD (ROUTINE X 2)     Status: Normal   Collection Time   05/10/12  7:15 PM      Component Value Range Status Comment   Specimen Description BLOOD RIGHT ARM   Final    Special Requests BOTTLES DRAWN AEROBIC AND ANAEROBIC 5CC EACH   Final    Culture  Setup Time 05/11/2012 02:16   Final    Culture NO GROWTH 5 DAYS   Final    Report Status 05/17/2012 FINAL   Final   URINE CULTURE     Status: Normal   Collection Time   05/10/12  8:11 PM      Component Value Range Status Comment   Specimen Description URINE, CLEAN CATCH   Final    Special Requests NONE   Final    Culture  Setup Time 05/11/2012 02:42   Final    Colony Count 50,000 COLONIES/ML   Final    Culture ESCHERICHIA COLI   Final    Report Status 05/12/2012 FINAL   Final    Organism ID, Bacteria ESCHERICHIA COLI   Final   MRSA PCR SCREENING     Status: Normal   Collection Time   05/11/12  1:22 AM      Component Value Range Status  Comment   MRSA by PCR NEGATIVE  NEGATIVE Final   CLOSTRIDIUM DIFFICILE BY PCR     Status: Normal   Collection Time   05/17/12  5:48 AM      Component Value Range Status Comment   C difficile by pcr NEGATIVE  NEGATIVE Final      Procedures and Diagnostic Studies: None.   Scheduled Meds:    . cefTRIAXone (ROCEPHIN)  IV  1 g Intravenous Q24H  . DULoxetine  60 mg Oral Daily  . filgrastim (NEUPOGEN) IV  480 mcg Intravenous q1800  . levothyroxine  100 mcg Oral Daily  . pantoprazole  80 mg Oral Q1200  . potassium chloride  40 mEq Oral BID  . sodium chloride  500 mL Intravenous Once  . sodium chloride  3 mL Intravenous Q12H   Continuous Infusions:    . 0.9 % NaCl with KCl 20 mEq / L 10 mL/hr (05/19/12 1502)    Time spent: 25 minutes.   LOS: 10 days   Maria Mckinney  [facility] Pager [phone].  If 8PM-8AM, please contact night-coverage at [email or web address], password [facility] 05/20/2012, 8:09 AM

## 2012-05-21 ENCOUNTER — Other Ambulatory Visit: Payer: Self-pay | Admitting: Adult Health

## 2012-05-21 LAB — CBC WITH DIFFERENTIAL/PLATELET
Eosinophils Relative: 0 % (ref 0–5)
Lymphocytes Relative: 23 % (ref 12–46)
MCV: 86.6 fL (ref 78.0–100.0)
Monocytes Relative: 11 % (ref 3–12)
Neutrophils Relative %: 65 % (ref 43–77)
Platelets: 38 10*3/uL — ABNORMAL LOW (ref 150–400)
RBC: 3.29 MIL/uL — ABNORMAL LOW (ref 3.87–5.11)
WBC: 3 10*3/uL — ABNORMAL LOW (ref 4.0–10.5)

## 2012-05-21 LAB — BASIC METABOLIC PANEL
CO2: 25 mEq/L (ref 19–32)
Calcium: 8.2 mg/dL — ABNORMAL LOW (ref 8.4–10.5)
GFR calc Af Amer: 67 mL/min — ABNORMAL LOW (ref >90)
GFR calc non Af Amer: 58 mL/min — ABNORMAL LOW (ref >90)
Sodium: 133 mEq/L — ABNORMAL LOW (ref 135–145)

## 2012-05-21 LAB — TYPE AND SCREEN

## 2012-05-21 MED ORDER — POTASSIUM CHLORIDE CRYS ER 20 MEQ PO TBCR: 20.0000 meq | EXTENDED_RELEASE_TABLET | Freq: Every day | ORAL | Status: DC

## 2012-05-21 NOTE — Progress Notes (Signed)
Pt slept until 1030, states she feels stronger today. Appetite fair. Ambulating to the BR, tolerates well. Loose stool x2. Discharge instructions explained to husband and pt. Port flushed with 10 cc NS and 500 units heparin, then deaccessed. Bandaid put over port site.

## 2012-05-21 NOTE — Plan of Care (Signed)
Problem: Discharge Progression Outcomes Goal: Discharge plan in place and appropriate Outcome: Completed/Met Date Met:  05/21/12 Discharged to husband

## 2012-05-21 NOTE — Discharge Summary (Signed)
Physician Discharge Summary  Maria Mckinney WUJ:[id number] DOB: 08-May-1950 DOA: 05/10/2012  PCP: [name], MD  Admit date: 05/10/2012 Discharge date: 05/21/2012  Recommendations for Outpatient Follow-up:  1. Recommend followup electrolytes and CBC at appointment 05/23/2012.  Discharge Diagnoses:  Principal Problem:  *Febrile neutropenia Active Problems:   Cancer of upper-outer quadrant of female breast   E. Coli Urinary tract infection   Fibromyalgia   GERD (gastroesophageal reflux disease)   Hypertension   Pancytopenia   Anemia of chronic disease   Thrombocytopenia   Hypokalemia   Acute kidney injury   Hyponatremia   Sepsis   Hypomagnesemia   Discharge Condition: Improved.  Diet recommendation: Regular as tolerated.  History of present illness:  62 year old female with PMH of breast cancer undergoing chemotherapy who presented to [facility] 05/10/2012 for evaluation of fever, chills and nausea. Patient was doing well until one day prior to admission when she went for her chemotherapy session. Patient completed her chemotherapy session without any problems but then she started feeling feverish, fever 100.5 at home associated with chills. Patient also had intermittent nausea associated with generalized weakness. In ED, patient was found to be severely neutropenic with a white blood cell count is 0.7. In addition patient had hypokalemia, hyponatremia and thrombocytopenia. Patient was subsequently admitted for neutropenic fever.   Hospital Course by problem:  Principal Problem:  *Febrile neutropenia / sepsis / urinary tract infection  Sepsis from a urinary tract infection secondary to Escherichia coli.  Calcitonin elevated at 1.37.  Initially treated with Zosyn, now on ceftriaxone based on sensitivity data. 10 discontinue antibiotics at discharge as the patient has had 10 days of therapy. Given Neupogen during the course of her hospital stay to help in recovery  of neutropenia. Active Problems:  Cancer of upper-outer quadrant of female breast  Patient is s/p port-a-cath placement for neoadjuvant chemotherapy consisting of Taxotere/carboplatinum/Herceptin. She began cycle #1 on 02/29/2012 and has completed 4 of 4 cycles of Taxotere and carboplatinum on 1/3//2014, with Herceptin given weekly, last dose on 05/16/2013, due for next Herceptin on 05/23/12. Has a followup appointment scheduled with Dr. Welton Flakes. Pancytopenia  This is likely secondary to sequela of chemotherapy. White blood cell count is recovering status post 2 days of Neupogen. Patient has received a total of 3 units of packed red blood cells during this admission.   Platelet count slowly trending up.  GERD (gastroesophageal reflux disease)  Continue Protonix. Hypertension  Systolic blood pressure slightly high. Not on routine antihypertensives at home. Will defer to primary care physician regarding whether or not this needs treatment. Acute kidney injury  Mild, creatinine now back to baseline values. 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  Hypokalemia / hypomagnesemia  Normalized with NS with IV potassium in fluids and oral potassium replacement.  4 g IV magnesium sulfate given 05/19/2012 for hypomagnesemia.   Procedures:  None.   Consultations:  Dr. Drue Second, Oncology.  Discharge Exam: Filed Vitals:   05/21/12 0520  BP: 152/87  Pulse: 102  Temp: 98.1 F (36.7 C)  Resp: 18   Filed Vitals:   05/20/12 1258 05/20/12 1414 05/20/12 2122 05/21/12 0520  BP: 143/67 157/80 154/79 152/87  Pulse: 104 106 104 102  Temp: 97.8 F (36.6 C) 98 F (36.7 C) 98.7 F (37.1 C) 98.1 F (36.7 C)  TempSrc: Oral Oral Oral Oral  Resp: 16 18 18 18   Height:      Weight:      SpO2:  98% 98%  98%    Gen: NAD  Cardiovascular: RRR, No M/R/G  Respiratory: Lungs diminished  Gastrointestinal: Abdomen softly distended, NT/ND, + BS  Extremities: Trace edema lower  extremities, right hand swollen  Discharge Instructions  Discharge Orders    Future Appointments: Provider: Department: Dept Phone: Center:   05/23/2012 9:15 AM Windell Hummingbird [facility] MEDICAL ONCOLOGY [phone] None   05/23/2012 9:45 AM Augustin Schooling, NP [facility] MEDICAL ONCOLOGY [phone] None   05/23/2012 10:45 AM [facility] MEDICAL ONCOLOGY [phone] None   06/07/2012 9:00 AM [name], MD [facility] [place] [phone] None     Future Orders Please Complete By Expires   Diet general      Increase activity slowly      Call MD for:  temperature >100.4      Call MD for:  persistant nausea and vomiting      Call MD for:  severe uncontrolled pain          Medication List     As of 05/21/2012 11:57 AM    TAKE these medications         bisoprolol-hydrochlorothiazide 5-6.25 MG per tablet   Commonly known as: ZIAC   Take 1 tablet by mouth daily.      cyclobenzaprine 10 MG tablet   Commonly known as: FLEXERIL   Take 10 mg by mouth 3 (three) times daily as needed. For muscle pain.      DULoxetine 60 MG capsule   Commonly known as: CYMBALTA   Take 60 mg by mouth daily.      esomeprazole 40 MG capsule   Commonly known as: NEXIUM   Take 1 capsule (40 mg total) by mouth daily before breakfast.      HYDROcodone-acetaminophen 5-500 MG per tablet   Commonly known as: VICODIN   Take 1 tablet by mouth every 6 (six) hours as needed. For pain.      levothyroxine 100 MCG tablet   Commonly known as: SYNTHROID, LEVOTHROID   Take 100 mcg by mouth daily.      lidocaine-prilocaine cream   Commonly known as: EMLA   Apply 1 application topically as needed. For port-a-cath access.      LORazepam 0.5 MG tablet   Commonly known as: ATIVAN   Take 1 tablet (0.5 mg total) by mouth every 6 (six) hours as needed (Nausea or vomiting).      magic mouthwash w/lidocaine Soln   Take 5 mLs by mouth 4  (four) times daily.      meloxicam 15 MG tablet   Commonly known as: MOBIC   Take 15 mg by mouth daily.      mometasone 50 MCG/ACT nasal spray   Commonly known as: NASONEX   Place 2 sprays into the nose daily.      ondansetron 8 MG tablet   Commonly known as: ZOFRAN   Take 1 tablet (8 mg total) by mouth 2 (two) times daily. Take BID starting the day after chemo x 3 days. Then BID PRN nausea or vomiting.      potassium chloride SA 20 MEQ tablet   Commonly known as: K-DUR,KLOR-CON   Take 1 tablet (20 mEq total) by mouth daily.      PRESCRIPTION MEDICATION   Inject into the vein every 7 (seven) days. Herceptin infusion every Wednesday.      prochlorperazine 10 MG tablet   Commonly known as: COMPAZINE   Take 1 tablet (10  mg total) by mouth every 6 (six) hours as needed (Nausea or vomiting).      promethazine 25 MG tablet   Commonly known as: PHENERGAN   Take 1 tablet (25 mg total) by mouth every 6 (six) hours as needed for nausea.      pseudoephedrine 30 MG tablet   Commonly known as: SUDAFED   Take 30 mg by mouth every 4 (four) hours as needed.          The results of significant diagnostics from this hospitalization (including imaging, microbiology, ancillary and laboratory) are listed below for reference.    Significant Diagnostic Studies: Dg Chest Port 1 View  05/10/2012  *RADIOLOGY REPORT*  Clinical Data: Neutropenic fever  PORTABLE CHEST - 1 VIEW  Comparison:  04/27/2012  Findings: Cardiomediastinal silhouette is stable.  Right Port-A- Cath is unchanged in position.  No acute infiltrate or pleural effusion.  No pulmonary edema.  IMPRESSION: No active disease.  No significant change.   Original Report Authenticated By: Natasha Mead, M.D.     Microbiology: Recent Results (from the past 240 hour(s))  CLOSTRIDIUM DIFFICILE BY PCR     Status: Normal   Collection Time   05/17/12  5:48 AM      Component Value Range Status Comment   C difficile by pcr NEGATIVE  NEGATIVE Final       Labs: Basic Metabolic Panel:  Lab 05/21/12 3244 05/20/12 0550 05/19/12 0600 05/18/12 0515 05/17/12 0525 05/15/12 0510  NA 133* 133* 133* 133* 132* --  K 3.9 3.8 3.7 3.3* 2.6* --  CL 97 100 100 100 95* --  CO2 25 24 24 22 21  --  GLUCOSE 87 93 77 78 80 --  BUN 6 5* 5* 7 8 --  CREATININE 1.01 1.00 1.06 1.06 1.07 --  CALCIUM 8.2* 8.3* 8.0* 8.2* 8.5 --  MG -- -- 0.8* -- 1.6 1.1*  PHOS -- -- -- -- -- --   CBC:  Lab 05/21/12 0644 05/20/12 0550 05/19/12 0600 05/18/12 0515 05/17/12 0525 05/16/12 0550  WBC 3.0* 3.2* 1.6* 1.0* 1.3* --  NEUTROABS 2.0 2.0 0.9* -- -- 0.4*  HGB 9.9* 6.4* 8.9* 8.9* 10.1* --  HCT 28.5* 18.2* 25.1* 24.8* 27.6* --  MCV 86.6 87.5 86.9 85.8 84.7 --  PLT 38* 39* 29* 25* 29* --    Time coordinating discharge: 35 minutes.  Signed:  RAMA,CHRISTINA  Pager [phone] [facility] 05/21/2012, 11:57 AM

## 2012-05-22 ENCOUNTER — Telehealth: Payer: Self-pay | Admitting: Medical Oncology

## 2012-05-22 NOTE — Telephone Encounter (Signed)
Patient called c/o diarrhea, since 0600 x 5 till 10:00, took immodium and diarrhea stopped, reports @ 11:00 had just gas and no further episodes since. Patient reports no pain, no N/V or fever, just a general feeling of unwellness. Next scheduled appt with lab/NP/Tx  05/23/12. Per NP patient to push fluids and keep scheduled appt tomorrow. Patient expressed understanding, no further questions at this time.

## 2012-05-23 ENCOUNTER — Ambulatory Visit (HOSPITAL_BASED_OUTPATIENT_CLINIC_OR_DEPARTMENT_OTHER): Payer: BC Managed Care – PPO

## 2012-05-23 ENCOUNTER — Telehealth: Payer: Self-pay | Admitting: Oncology

## 2012-05-23 ENCOUNTER — Ambulatory Visit (HOSPITAL_BASED_OUTPATIENT_CLINIC_OR_DEPARTMENT_OTHER): Payer: BC Managed Care – PPO | Admitting: Adult Health

## 2012-05-23 ENCOUNTER — Encounter: Payer: Self-pay | Admitting: Adult Health

## 2012-05-23 ENCOUNTER — Other Ambulatory Visit (HOSPITAL_BASED_OUTPATIENT_CLINIC_OR_DEPARTMENT_OTHER): Payer: BC Managed Care – PPO | Admitting: Lab

## 2012-05-23 VITALS — BP 138/81 | HR 88 | Temp 98.4°F | Resp 20 | Ht 62.0 in | Wt 184.9 lb

## 2012-05-23 DIAGNOSIS — D696 Thrombocytopenia, unspecified: Secondary | ICD-10-CM

## 2012-05-23 DIAGNOSIS — C50419 Malignant neoplasm of upper-outer quadrant of unspecified female breast: Secondary | ICD-10-CM

## 2012-05-23 DIAGNOSIS — Z79899 Other long term (current) drug therapy: Secondary | ICD-10-CM

## 2012-05-23 DIAGNOSIS — Z5112 Encounter for antineoplastic immunotherapy: Secondary | ICD-10-CM

## 2012-05-23 DIAGNOSIS — E876 Hypokalemia: Secondary | ICD-10-CM

## 2012-05-23 DIAGNOSIS — E860 Dehydration: Secondary | ICD-10-CM

## 2012-05-23 LAB — COMPREHENSIVE METABOLIC PANEL (CC13)
AST: 43 U/L — ABNORMAL HIGH (ref 5–34)
Albumin: 2.6 g/dL — ABNORMAL LOW (ref 3.5–5.0)
BUN: 7 mg/dL (ref 7.0–26.0)
Calcium: 7.8 mg/dL — ABNORMAL LOW (ref 8.4–10.4)
Chloride: 98 mEq/L (ref 98–107)
Glucose: 98 mg/dl (ref 70–99)
Potassium: 3.5 mEq/L (ref 3.5–5.1)
Sodium: 135 mEq/L — ABNORMAL LOW (ref 136–145)
Total Protein: 6 g/dL — ABNORMAL LOW (ref 6.4–8.3)

## 2012-05-23 LAB — CBC WITH DIFFERENTIAL/PLATELET
BASO%: 0.8 % (ref 0.0–2.0)
Basophils Absolute: 0 10*3/uL (ref 0.0–0.1)
HCT: 31.3 % — ABNORMAL LOW (ref 34.8–46.6)
HGB: 10.7 g/dL — ABNORMAL LOW (ref 11.6–15.9)
MONO#: 0.7 10*3/uL (ref 0.1–0.9)
NEUT#: 3.2 10*3/uL (ref 1.5–6.5)
NEUT%: 62.6 % (ref 38.4–76.8)
RDW: 17.1 % — ABNORMAL HIGH (ref 11.2–14.5)
WBC: 5 10*3/uL (ref 3.9–10.3)
lymph#: 1.1 10*3/uL (ref 0.9–3.3)

## 2012-05-23 MED ORDER — HEPARIN SOD (PORK) LOCK FLUSH 100 UNIT/ML IV SOLN
500.0000 [IU] | Freq: Once | INTRAVENOUS | Status: AC | PRN
  Administered 2012-05-23: 500 [IU]
  Filled 2012-05-23: qty 5

## 2012-05-23 MED ORDER — SODIUM CHLORIDE 0.9 % IJ SOLN
10.0000 mL | INTRAMUSCULAR | Status: DC | PRN
  Administered 2012-05-23: 10 mL
  Filled 2012-05-23: qty 10

## 2012-05-23 MED ORDER — TRASTUZUMAB CHEMO INJECTION 440 MG
6.0000 mg/kg | Freq: Once | INTRAVENOUS | Status: AC
  Administered 2012-05-23: 504 mg via INTRAVENOUS
  Filled 2012-05-23: qty 24

## 2012-05-23 MED ORDER — DIPHENHYDRAMINE HCL 25 MG PO CAPS
50.0000 mg | ORAL_CAPSULE | Freq: Once | ORAL | Status: AC
  Administered 2012-05-23: 50 mg via ORAL

## 2012-05-23 MED ORDER — ACETAMINOPHEN 325 MG PO TABS
650.0000 mg | ORAL_TABLET | Freq: Once | ORAL | Status: AC
  Administered 2012-05-23: 650 mg via ORAL

## 2012-05-23 MED ORDER — SODIUM CHLORIDE 0.9 % IV SOLN
Freq: Once | INTRAVENOUS | Status: AC
  Administered 2012-05-23: 11:00:00 via INTRAVENOUS

## 2012-05-23 MED ORDER — SODIUM CHLORIDE 0.9 % IV SOLN
1000.0000 mL | Freq: Once | INTRAVENOUS | Status: AC
  Administered 2012-05-23: 11:00:00 via INTRAVENOUS

## 2012-05-23 MED ORDER — SODIUM CHLORIDE 0.9 % IV SOLN: Freq: Once | INTRAVENOUS | Status: DC

## 2012-05-23 MED ORDER — POTASSIUM CHLORIDE CRYS ER 20 MEQ PO TBCR
40.0000 meq | EXTENDED_RELEASE_TABLET | Freq: Once | ORAL | Status: AC
  Administered 2012-05-23: 40 meq via ORAL
  Filled 2012-05-23: qty 2

## 2012-05-23 NOTE — Telephone Encounter (Signed)
gv pt appts schedule for January thru March. Pt given appts for mammo/us @ [facility] 1/27 @ 10:15am - s/w Selena Batten and order faxed, [facility] Kitt[facility]. Pt also given parking lot codes for [facility] for January (0090) and February (0900).

## 2012-05-23 NOTE — Addendum Note (Signed)
Addended by: Augustin Schooling C on: 05/23/2012 11:39 AM   Modules accepted: Orders

## 2012-05-23 NOTE — Progress Notes (Signed)
Maria Mckinney [id number] May 01, 1951 62 y.o. 05/23/2012 10:15 AM  CC  Maria Ravel, MD Dr. Burnell Mckinney [address] [place] [address] Dr. Emelia Mckinney Dr. Lurline Mckinney Dr. 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  PRIOR THERAPY:  #1Patient was originally seen in the multidisciplinary breast clinic for new diagnosis of left breast cancer that was 8mm in the upper outer quadrant of the left breast.  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  There was an additional 4 cm area of normal signal.  Patient did not get an MRI secondary to cochlear implant.  She desires breast conservation.    #2 Patient is s/p port-a-cath placement for neoadjuvant chemotherapy consisting of Taxotere/carboplatinum/Herceptin.  She will begin cycle #1 on 02/29/2012.  She will receive Taxotere and carboplatinum every 21 days with Herceptin giving weekly.  She will receive a total of 4 cycles of Taxotere and Carboplatinum.    CURRENT THERAPY: Herceptin q3 weeks  INTERVAL HISTORY: Ms. Maria Mckinney is doing well.  She was recently discharged from hospital on 1/20 due to febrile neutropenia, pancytopenia, nausea, vomiting, diarrhea, dehydration and hypokalemia.  She has since then improved.  Her counts have almost completely recovered. She did have 3-4 episodes of diarrhea yesterday, took one imodium pill and it has since stopped.  Otherwise, she's feeling well, denies fevers, chills, nausea, vomiting, or any other concerns.  She's eating and drinking much better than previously.    Past Medical History: Past Medical History  Diagnosis Date  . HTN (hypertension)   . Fibromyalgia   . GERD (gastroesophageal reflux disease)   . Arthritis   . Anxiety   . Dysrhythmia     irreg hr  . Wears hearing aid     left ear  .  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 PONV (postoperative nausea and vomiting)     Past Surgical History: Past Surgical History  Procedure Date  . Appendectomy   . Combined hysteroscopy diagnostic / d&c   . Cochlear implant   . Portacath placement 02/22/2012    Procedure: INSERTION PORT-A-CATH;  Surgeon: Maria Loron, MD;  Location: [facility];  Service: General;  Laterality: Right;    Family History: Family History  Problem Relation Age of Onset  . Cancer Mother     "tumor of face removed"  . Cancer Father     kidney removed  . Cancer Maternal Aunt     stomach & Pancreatic  . Cancer Paternal Uncle     pancreatic    Social History History  Substance Use Topics  . Smoking status: Never Smoker   . Smokeless tobacco: Never Used  . Alcohol Use: 0.0 oz/week    0-1 Glasses of wine per week    Allergies: Allergies  Allergen Reactions  . Acyclovir And Related   . Ciprofloxacin Hcl Rash  . Bactrim (Sulfamethoxazole W-Trimethoprim)     Current Medications: Current Outpatient Prescriptions  Medication Sig Dispense Refill  . bisoprolol-hydrochlorothiazide (ZIAC) 5-6.25 MG per tablet Take 1 tablet by mouth daily.      . cyclobenzaprine (FLEXERIL) 10 MG tablet Take 10 mg by mouth 3 (three) times daily as needed. For muscle pain.      Marland Kitchen esomeprazole (NEXIUM) 40 MG capsule Take 1 capsule (40 mg total) by mouth daily before breakfast.  30  capsule  6  . HYDROcodone-acetaminophen (VICODIN) 5-500 MG per tablet Take 1 tablet by mouth every 6 (six) hours as needed. For pain.      Marland Kitchen levothyroxine (SYNTHROID, LEVOTHROID) 100 MCG tablet Take 100 mcg by mouth daily.      Marland Kitchen lidocaine-prilocaine (EMLA) cream Apply 1 application topically as needed. For port-a-cath access.      . meloxicam (MOBIC) 15 MG tablet Take 15 mg by mouth daily.      . mometasone (NASONEX) 50 MCG/ACT nasal spray Place 2 sprays into the nose daily.      . ondansetron (ZOFRAN) 8 MG tablet Take 1  tablet (8 mg total) by mouth 2 (two) times daily. Take BID starting the day after chemo x 3 days. Then BID PRN nausea or vomiting.  30 tablet  1  . potassium chloride SA (K-DUR,KLOR-CON) 20 MEQ tablet Take 1 tablet (20 mEq total) by mouth daily.  30 tablet  0  . PRESCRIPTION MEDICATION Inject into the vein every 7 (seven) days. Herceptin infusion every Wednesday.      . Alum & Mag Hydroxide-Simeth (MAGIC MOUTHWASH W/LIDOCAINE) SOLN Take 5 mLs by mouth 4 (four) times daily.  200 mL  6  . DULoxetine (CYMBALTA) 60 MG capsule Take 60 mg by mouth daily.      Marland Kitchen LORazepam (ATIVAN) 0.5 MG tablet Take 1 tablet (0.5 mg total) by mouth every 6 (six) hours as needed (Nausea or vomiting).  30 tablet  0  . prochlorperazine (COMPAZINE) 10 MG tablet Take 1 tablet (10 mg total) by mouth every 6 (six) hours as needed (Nausea or vomiting).  30 tablet  1  . promethazine (PHENERGAN) 25 MG tablet Take 1 tablet (25 mg total) by mouth every 6 (six) hours as needed for nausea.  60 tablet  0  . pseudoephedrine (SUDAFED) 30 MG tablet Take 30 mg by mouth every 4 (four) hours as needed.       Current Facility-Administered Medications  Medication Dose Route Frequency Provider Last Rate Last Dose  . 0.9 %  sodium chloride infusion   Intravenous Once Augustin Schooling, NP        Health Maintenance:  ColonoscopyShe has never had a colonoscopy Bone DensityBone density about 10-12 years ago Last PAP smearLast Pap smear was in 2012   REVIEW OF SYSTEMS: General: fatigue (-), night sweats (-), fever (-), pain (-) Lymph: palpable nodes (-) HEENT: vision changes (-), mucositis (-), gum bleeding (-), epistaxis (-) Cardiovascular: chest pain (-), palpitations (-) Pulmonary: shortness of breath (-), dyspnea on exertion (-), cough (-), hemoptysis (-) GI:  Early satiety (-), melena (-), dysphagia (-), nausea/vomiting (+), diarrhea (-) GU: dysuria (-), hematuria (-), incontinence (-) Musculoskeletal: joint swelling (-), joint pain  (-), back pain (-) Neuro: weakness (-), numbness (-), headache (-), confusion (-) Skin: Rash (-), lesions (-), dryness (-) Psych: depression (-), suicidal/homicidal ideation (-), feeling of hopelessness (-)   PHYSICAL EXAMINATION: Blood pressure 138/81, pulse 88, temperature 98.4 F (36.9 C), temperature source Oral, resp. rate 20, height 5\' 2"  (1.575 m), weight 184 lb 14.4 oz (83.87 kg). General: Patient is a well appearing female in no acute distress HEENT: PERRLA, sclerae anicteric no conjunctival pallor, MMM, 4 ulcerations on right posterior palate, appear to be healing Neck: supple, no palpable adenopathy Lungs: clear to auscultation bilaterally, no wheezes, rhonchi, or rales Cardiovascular: regular rate rhythm, S1, S2, no murmurs, rubs or gallops,  Abdomen: Soft, non-tender, non-distended, normoactive bowel sounds, no HSM Extremities: warm and  well perfused, no clubbing, cyanosis, or edema Skin: No rashes or lesions Neuro: Non-focal ECOG PERFORMANCE STATUS: 1 - Symptomatic but completely ambulatory Breasts: unable to palpate left breast mass, no nodularity, right breast: no masses or nodularity  STUDIES/RESULTS: No results found.   LABS:    Chemistry      Component Value Date/Time   NA 133* 05/21/2012 0644   NA 129 Sample Drawn by RN* 05/09/2012 0939   K 3.9 05/21/2012 0644   K 3.0 Repeated and Verified* 05/09/2012 0939   CL 97 05/21/2012 0644   CL 89* 05/09/2012 0939   CO2 25 05/21/2012 0644   CO2 28 05/09/2012 0939   BUN 6 05/21/2012 0644   BUN 22.0 05/09/2012 0939   CREATININE 1.01 05/21/2012 0644   CREATININE 1.0 05/09/2012 0939      Component Value Date/Time   CALCIUM 8.2* 05/21/2012 0644   CALCIUM 8.8 05/09/2012 0939   ALKPHOS 59 05/10/2012 1909   ALKPHOS 85 05/09/2012 0939   AST 30 05/10/2012 1909   AST 78* 05/09/2012 0939   ALT 69* 05/10/2012 1909   ALT 137* 05/09/2012 0939   BILITOT 3.9* 05/10/2012 1909   BILITOT 2.42* 05/09/2012 0939      Lab Results  Component Value Date   WBC  5.0 05/23/2012   HGB 10.7* 05/23/2012   HCT 31.3* 05/23/2012   MCV 87.2 05/23/2012   PLT 74* 05/23/2012      PATHOLOGY:  ADDITIONAL INFORMATION: PROGNOSTIC INDICATORS - ACIS Results IMMUNOHISTOCHEMICAL AND MORPHOMETRIC ANALYSIS BY THE AUTOMATED CELLULAR IMAGING SYSTEM (ACIS) Estrogen Receptor (Negative, <1%): 0%, NEGATIVE Progesterone Receptor (Negative, <1%): 0%, NEGATIVE Proliferation Marker Ki67 by M IB-1 (Low<20%): 98% All controls stained appropriately Pecola Leisure MD Pathologist, Electronic Signature ( Signed 02/15/2012) CHROMOGENIC IN-SITU HYBRIDIZATION Interpretation: HER2/NEU BY CISH - SHOWS AMPLIFICATION BY CISH ANALYSIS. THE RATIO OF HER2: CEP 17 SIGNALS WAS 2.55. 40 TUMOR CELLS WERE SCORED. OF NOTE, THE TUMOR APPEARS TO SHOW HETEROGENEITY IN REGARDS TO HER2 EXPRESSION. Reference range: Ratio: HER2:CEP17 < 1.8 gene amplification not observed Ratio: HER2:CEP 17 1.8-2.2 - equivocal result Ratio: HER2:CEP17 > 2.2 - gene amplification observed Pecola Leisure MD Pathologist, Electronic Signature ( Signed 02/15/2012) 1 of ADDITIONAL INFORMATION: PROGNOSTIC INDICATORS - ACIS Results IMMUNOHISTOCHEMICAL AND MORPHOMETRIC ANALYSIS BY THE AUTOMATED CELLULAR IMAGING SYSTEM (ACIS) Estrogen Receptor (Negative, <1%): 0%, NEGATIVE Progesterone Receptor (Negative, <1%): 0%, NEGATIVE COMMENT: The negative hormone receptor study(ies) in this case have an internal positive control. All controls stained appropriately Abigail Miyamoto MD Pathologist, Electronic Signature ( Signed 02/22/2012)  ASSESSMENT #1 Stage 1 HER-2 positive left breast cancer ER negative PR negative.  Patient is being considered for neoadjuvant chemotherapy consisting of Taxotere carboplatinum and herceptin as she does want breast conservation.  She understands the risks and benefits of this approach and the side effects of her chemotherapy and Herceptin.  #2 She understands she will receive neulasta the day after  chemotherapy to keep her white count up.    #3 She does have thrombocytopenia and we will monitor this  #4 Dehydration  #5 Hypokalemia  PLAN:  #1 Ms. Connors will proceed with her weekly Herceptin.  I have asked that we reschedule her mammogram/breast ultrasound for re-evaluation of her breast cancer since she cannot have an MRI due to her cochlear implant. This has had to be rescheduled a couple of times.  They will call and reschedule them today.  Her appt with Dr. Dwain Sarna is on 06/07/12.  #2 I will give Ms. Trotter IV fluids  today for her dehydration.    #3  I will schedule a lab only appt for Ms. Szwed next week to monitor her potassium, and ensure her blood counts continue to recover prior to surgery.    #4 We will see her for her next herceptin in 3 weeks.    All questions were answered. The patient knows to call the clinic with any problems, questions or concerns. We can certainly see the patient much sooner if necessary.  I spent 25 minutes counseling the patient face to face. The total time spent in the appointment was 30 minutes.  Cherie Ouch Lyn Hollingshead, NP Medical Oncology [facility] Phone: [phone] 05/23/2012, 10:15 AM

## 2012-05-23 NOTE — Patient Instructions (Addendum)
Doing well.  Proceed with Herceptin and IV fluids.  Please call us if you have any questions or concerns.

## 2012-05-23 NOTE — Patient Instructions (Addendum)
[facility] Discharge Instructions for Patients Receiving Chemotherapy  Today you received the following chemotherapy agents Herceptin.  To help prevent nausea and vomiting after your treatment, we encourage you to take your nausea medication.   If you develop nausea and vomiting that is not controlled by your nausea medication, call the clinic. If it is after clinic hours your family physician or the after hours number for the clinic or go to the Emergency Department.   BELOW ARE SYMPTOMS THAT SHOULD BE REPORTED IMMEDIATELY:  *FEVER GREATER THAN 100.5 F  *CHILLS WITH OR WITHOUT FEVER  NAUSEA AND VOMITING THAT IS NOT CONTROLLED WITH YOUR NAUSEA MEDICATION  *UNUSUAL SHORTNESS OF BREATH  *UNUSUAL BRUISING OR BLEEDING  TENDERNESS IN MOUTH AND THROAT WITH OR WITHOUT PRESENCE OF ULCERS  *URINARY PROBLEMS  *BOWEL PROBLEMS  UNUSUAL RASH Items with * indicate a potential emergency and should be followed up as soon as possible.  One of the nurses will contact you 24 hours after your treatment. Please let the nurse know about any problems that you may have experienced. Feel free to call the clinic you have any questions or concerns. The clinic phone number is [phone].   I have been informed and understand all the instructions given to me. I know to contact the clinic, my physician, or go to the Emergency Department if any problems should occur. I do not have any questions at this time, but understand that I may call the clinic during office hours   should I have any questions or need assistance in obtaining follow up care.    __________________________________________  _____________  __________ Signature of Patient or Authorized Representative            Date                   Time    __________________________________________ Nurse's Signature    

## 2012-05-30 ENCOUNTER — Other Ambulatory Visit (HOSPITAL_BASED_OUTPATIENT_CLINIC_OR_DEPARTMENT_OTHER): Payer: BC Managed Care – PPO | Admitting: Lab

## 2012-05-30 ENCOUNTER — Telehealth: Payer: Self-pay | Admitting: *Deleted

## 2012-05-30 ENCOUNTER — Ambulatory Visit (HOSPITAL_COMMUNITY)
Admission: RE | Admit: 2012-05-30 | Discharge: 2012-05-30 | Disposition: A | Discharge status: Home / Self Care | Payer: BC Managed Care – PPO | Source: Ambulatory Visit | Attending: Family Medicine | Admitting: Family Medicine

## 2012-05-30 ENCOUNTER — Other Ambulatory Visit: Payer: Self-pay | Admitting: Emergency Medicine

## 2012-05-30 ENCOUNTER — Other Ambulatory Visit: Payer: Self-pay | Admitting: Adult Health

## 2012-05-30 DIAGNOSIS — C50419 Malignant neoplasm of upper-outer quadrant of unspecified female breast: Secondary | ICD-10-CM

## 2012-05-30 DIAGNOSIS — C50919 Malignant neoplasm of unspecified site of unspecified female breast: Secondary | ICD-10-CM | POA: Insufficient documentation

## 2012-05-30 DIAGNOSIS — E860 Dehydration: Secondary | ICD-10-CM

## 2012-05-30 DIAGNOSIS — I10 Essential (primary) hypertension: Secondary | ICD-10-CM | POA: Insufficient documentation

## 2012-05-30 DIAGNOSIS — Z79899 Other long term (current) drug therapy: Secondary | ICD-10-CM

## 2012-05-30 DIAGNOSIS — Z09 Encounter for follow-up examination after completed treatment for conditions other than malignant neoplasm: Secondary | ICD-10-CM

## 2012-05-30 LAB — CBC WITH DIFFERENTIAL/PLATELET
BASO%: 0.6 % (ref 0.0–2.0)
Basophils Absolute: 0.1 10*3/uL (ref 0.0–0.1)
EOS%: 0.2 % (ref 0.0–7.0)
HGB: 10.2 g/dL — ABNORMAL LOW (ref 11.6–15.9)
MCH: 30.6 pg (ref 25.1–34.0)
MONO%: 6.9 % (ref 0.0–14.0)
RBC: 3.33 10*6/uL — ABNORMAL LOW (ref 3.70–5.45)
RDW: 17.8 % — ABNORMAL HIGH (ref 11.2–14.5)
lymph#: 1.6 10*3/uL (ref 0.9–3.3)

## 2012-05-30 LAB — BASIC METABOLIC PANEL (CC13)
BUN: 9.1 mg/dL (ref 7.0–26.0)
Calcium: 7.5 mg/dL — ABNORMAL LOW (ref 8.4–10.4)
Creatinine: 1.8 mg/dL — ABNORMAL HIGH (ref 0.6–1.1)
Potassium: 3.7 mEq/L (ref 3.5–5.1)

## 2012-05-30 NOTE — Progress Notes (Signed)
  Echocardiogram 2D Echocardiogram has been performed.  Maria Mckinney 05/30/2012, 11:51 AM 

## 2012-05-30 NOTE — Telephone Encounter (Signed)
Per staff message and POF I have scheduled appts.  JMW  

## 2012-05-31 ENCOUNTER — Telehealth: Payer: Self-pay | Admitting: Oncology

## 2012-05-31 ENCOUNTER — Other Ambulatory Visit: Payer: Self-pay | Admitting: Emergency Medicine

## 2012-05-31 ENCOUNTER — Ambulatory Visit (HOSPITAL_BASED_OUTPATIENT_CLINIC_OR_DEPARTMENT_OTHER): Payer: BC Managed Care – PPO

## 2012-05-31 VITALS — BP 126/57 | HR 88 | Temp 98.9°F | Resp 20

## 2012-05-31 DIAGNOSIS — E860 Dehydration: Secondary | ICD-10-CM

## 2012-05-31 MED ORDER — SODIUM CHLORIDE 0.9 % IJ SOLN
10.0000 mL | INTRAMUSCULAR | Status: DC | PRN
  Administered 2012-05-31: 10 mL via INTRAVENOUS
  Filled 2012-05-31: qty 10

## 2012-05-31 MED ORDER — HEPARIN SOD (PORK) LOCK FLUSH 100 UNIT/ML IV SOLN
500.0000 [IU] | Freq: Once | INTRAVENOUS | Status: AC
  Administered 2012-05-31: 500 [IU] via INTRAVENOUS
  Filled 2012-05-31: qty 5

## 2012-05-31 MED ORDER — SODIUM CHLORIDE 0.9 % IV SOLN
1000.0000 mL | Freq: Once | INTRAVENOUS | Status: AC
  Administered 2012-05-31: 1000 mL via INTRAVENOUS

## 2012-05-31 NOTE — Telephone Encounter (Signed)
lmonvm for pt on both home/cell re appt for 2/3. Also confirmed appts for 1/30 and 1/31.

## 2012-05-31 NOTE — Patient Instructions (Addendum)
Dehydration, Adult Dehydration means your body does not have as much fluid as it needs. Your kidneys, brain, and heart will not work properly without the right amount of fluids and salt.  HOME CARE  Ask your doctor how to replace body fluid losses (rehydrate).  Drink enough fluids to keep your pee (urine) clear or pale yellow.  Drink small amounts of fluids often if you feel sick to your stomach (nauseous) or throw up (vomit).  Eat like you normally do.  Avoid:  Foods or drinks high in sugar.  Bubbly (carbonated) drinks.  Juice.  Very hot or cold fluids.  Drinks with caffeine.  Fatty, greasy foods.  Alcohol.  Tobacco.  Eating too much.  Gelatin desserts.  Wash your hands to avoid spreading germs (bacteria, viruses).  Only take medicine as told by your doctor.  Keep all doctor visits as told. GET HELP RIGHT AWAY IF:   You cannot drink something without throwing up.  You get worse even with treatment.  Your vomit has blood in it or looks greenish.  Your poop (stool) has blood in it or looks black and tarry.  You have not peed in 6 to 8 hours.  You pee a small amount of very dark pee.  You have a fever.  You pass out (faint).  You have belly (abdominal) pain that gets worse or stays in one spot (localizes).  You have a rash, stiff neck, or bad headache.  You get easily annoyed, sleepy, or are hard to wake up.  You feel weak, dizzy, or very thirsty. MAKE SURE YOU:   Understand these instructions.  Will watch your condition.  Will get help right away if you are not doing well or get worse. Document Released: 02/12/2009 Document Revised: 07/11/2011 Document Reviewed: 12/06/2010 Exit[employer] Patient Information 2013 [employer].  

## 2012-06-01 ENCOUNTER — Ambulatory Visit (HOSPITAL_BASED_OUTPATIENT_CLINIC_OR_DEPARTMENT_OTHER): Payer: BC Managed Care – PPO

## 2012-06-01 VITALS — BP 111/65 | HR 91 | Temp 98.7°F

## 2012-06-01 DIAGNOSIS — C50419 Malignant neoplasm of upper-outer quadrant of unspecified female breast: Secondary | ICD-10-CM

## 2012-06-01 DIAGNOSIS — E860 Dehydration: Secondary | ICD-10-CM

## 2012-06-01 MED ORDER — SODIUM CHLORIDE 0.9 % IV SOLN
Freq: Once | INTRAVENOUS | Status: AC
  Administered 2012-06-01: 15:00:00 via INTRAVENOUS

## 2012-06-01 NOTE — Patient Instructions (Addendum)
Dehydration, Adult Dehydration means your body does not have as much fluid as it needs. Your kidneys, brain, and heart will not work properly without the right amount of fluids and salt.  HOME CARE  Ask your doctor how to replace body fluid losses (rehydrate).  Drink enough fluids to keep your pee (urine) clear or pale yellow.  Drink small amounts of fluids often if you feel sick to your stomach (nauseous) or throw up (vomit).  Eat like you normally do.  Avoid:  Foods or drinks high in sugar.  Bubbly (carbonated) drinks.  Juice.  Very hot or cold fluids.  Drinks with caffeine.  Fatty, greasy foods.  Alcohol.  Tobacco.  Eating too much.  Gelatin desserts.  Wash your hands to avoid spreading germs (bacteria, viruses).  Only take medicine as told by your doctor.  Keep all doctor visits as told. GET HELP RIGHT AWAY IF:   You cannot drink something without throwing up.  You get worse even with treatment.  Your vomit has blood in it or looks greenish.  Your poop (stool) has blood in it or looks black and tarry.  You have not peed in 6 to 8 hours.  You pee a small amount of very dark pee.  You have a fever.  You pass out (faint).  You have belly (abdominal) pain that gets worse or stays in one spot (localizes).  You have a rash, stiff neck, or bad headache.  You get easily annoyed, sleepy, or are hard to wake up.  You feel weak, dizzy, or very thirsty. MAKE SURE YOU:   Understand these instructions.  Will watch your condition.  Will get help right away if you are not doing well or get worse. Document Released: 02/12/2009 Document Revised: 07/11/2011 Document Reviewed: 12/06/2010 Exit[employer] Patient Information 2013 [employer].  

## 2012-06-04 ENCOUNTER — Telehealth: Payer: Self-pay | Admitting: Oncology

## 2012-06-04 ENCOUNTER — Other Ambulatory Visit (HOSPITAL_BASED_OUTPATIENT_CLINIC_OR_DEPARTMENT_OTHER): Payer: BC Managed Care – PPO | Admitting: Lab

## 2012-06-04 ENCOUNTER — Ambulatory Visit (HOSPITAL_COMMUNITY)
Admission: RE | Admit: 2012-06-04 | Discharge: 2012-06-04 | Disposition: A | Discharge status: Home / Self Care | Payer: BC Managed Care – PPO | Source: Ambulatory Visit | Attending: Internal Medicine | Admitting: Internal Medicine

## 2012-06-04 ENCOUNTER — Ambulatory Visit (HOSPITAL_BASED_OUTPATIENT_CLINIC_OR_DEPARTMENT_OTHER): Payer: BC Managed Care – PPO | Admitting: Adult Health

## 2012-06-04 ENCOUNTER — Encounter: Payer: Self-pay | Admitting: Adult Health

## 2012-06-04 VITALS — BP 142/64 | HR 92 | Wt 171.2 lb

## 2012-06-04 VITALS — BP 102/65 | HR 90 | Temp 98.4°F | Resp 20 | Ht 62.0 in | Wt 170.7 lb

## 2012-06-04 DIAGNOSIS — C50419 Malignant neoplasm of upper-outer quadrant of unspecified female breast: Secondary | ICD-10-CM

## 2012-06-04 DIAGNOSIS — E860 Dehydration: Secondary | ICD-10-CM

## 2012-06-04 DIAGNOSIS — E876 Hypokalemia: Secondary | ICD-10-CM

## 2012-06-04 DIAGNOSIS — N179 Acute kidney failure, unspecified: Secondary | ICD-10-CM

## 2012-06-04 DIAGNOSIS — D696 Thrombocytopenia, unspecified: Secondary | ICD-10-CM

## 2012-06-04 DIAGNOSIS — R112 Nausea with vomiting, unspecified: Secondary | ICD-10-CM

## 2012-06-04 LAB — CBC WITH DIFFERENTIAL/PLATELET
BASO%: 1.4 % (ref 0.0–2.0)
Eosinophils Absolute: 0 10*3/uL (ref 0.0–0.5)
MONO#: 0.6 10*3/uL (ref 0.1–0.9)
NEUT#: 6.1 10*3/uL (ref 1.5–6.5)
RBC: 3.09 10*6/uL — ABNORMAL LOW (ref 3.70–5.45)
RDW: 19.1 % — ABNORMAL HIGH (ref 11.2–14.5)
WBC: 8.4 10*3/uL (ref 3.9–10.3)
lymph#: 1.5 10*3/uL (ref 0.9–3.3)

## 2012-06-04 LAB — COMPREHENSIVE METABOLIC PANEL (CC13)
ALT: 17 U/L (ref 0–55)
Albumin: 2.9 g/dL — ABNORMAL LOW (ref 3.5–5.0)
CO2: 28 mEq/L (ref 22–29)
Glucose: 90 mg/dl (ref 70–99)
Potassium: 3.4 mEq/L — ABNORMAL LOW (ref 3.5–5.1)
Sodium: 140 mEq/L (ref 136–145)
Total Protein: 6.4 g/dL (ref 6.4–8.3)

## 2012-06-04 MED ORDER — SUCRALFATE 1 GM/10ML PO SUSP: 1.0000 g | Freq: Four times a day (QID) | ORAL | Status: DC

## 2012-06-04 NOTE — Telephone Encounter (Signed)
Added IVF's for Saturday 2/8. lmonvm informing pt and added comment to 2/4 appt to send pt for new schedule.

## 2012-06-04 NOTE — Progress Notes (Signed)
Patient ID: Maria Mckinney, female   DOB: November 17, 1950, 62 y.o.   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 She will receive Taxotere and carboplatinum every 21 days with Herceptin weekly. She will receive a total of 4 cycles of Taxotere and Carboplatinum. Plans to continue Herceptin until November 2014.   02/21/12 ECHO EF 60-65% Lateral S' 7.1 05/30/12 ECHO 55-60% Lateral S' 7.2  Admitted to [facility] 1/9 fever, nasuea, and chills. Treated to E Coli UTI.   06/04/12 K 3.4 Creatinine 1.5 Hemoglobin 9.8  She present to HF clinic as new patient at the request of Dr Welton Flakes due to breast cancer treatment. Complains of fatigue and exertional dyspnea. Requires assistance with  ADLs and ambulating.     Review of Systems:     Cardiac Review of Systems: {Y] = yes [ ]  = no  Chest Pain [    ]  Resting SOB [   ] Exertional SOB  [Y  ]  Orthopnea [  ]   Pedal Edema [   ]    Palpitations [  ] Syncope  [  ]   Presyncope [   ]  General Review of Systems: [Y] = yes [  ]=no Constitional: recent weight change [  ]; anorexia [  ]; fatigue [Y  ]; nausea [  ]; night sweats [  ]; fever [  ]; or chills [  ];                                                                                                                                         Dental: poor dentition[  ]; Last Dentist visit:   Eye : blurred vision [  ]; diplopia [   ]; vision changes [  ];  Amaurosis fugax[  ]; Resp: cough [  ];  wheezing[  ];  hemoptysis[  ]; shortness of breath[ Y ]; paroxysmal nocturnal dyspnea[  ]; dyspnea on exertion[ Y ]; or orthopnea[  ];  GI:  gallstones[  ], vomiting[  ];   dysphagia[  ]; melena[  ];  hematochezia [  ]; heartburn[  ];   Hx of  Colonoscopy[  ]; GU: kidney stones [  ]; hematuria[  ];   dysuria [  ];  nocturia[  ];  history of     obstruction [  ];                 Skin: rash, swelling[  ];, hair loss[Y  ];  peripheral edema[  ];  or itching[  ]; Musculosketetal: myalgias[  ];  joint swelling[  ];  joint erythema[  ];  joint pain[  ];  back pain[  ];  Heme/Lymph: bruising[  ];  bleeding[  ];  anemia[  ];  Neuro: TIA[  ];  headaches[  ];  stroke[  ];  vertigo[  ];  seizures[  ];   paresthesias[  ];  difficulty walking[  ];  Psych:depression[  ]; anxiety[  ];  Endocrine: diabetes[  ];  thyroid dysfunction[  ];  Immunizations: Flu [  ]; Pneumococcal[  ];  Other:    Past Medical History  Diagnosis Date  . HTN (hypertension)   . Fibromyalgia   . GERD (gastroesophageal reflux disease)   . Arthritis   . Anxiety   . Dysrhythmia     irreg hr  . Wears hearing aid     left ear  . 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 PONV (postoperative nausea and vomiting)     Current Outpatient Prescriptions  Medication Sig Dispense Refill  . Alum & Mag Hydroxide-Simeth (MAGIC MOUTHWASH W/LIDOCAINE) SOLN Take 5 mLs by mouth 4 (four) times daily.  200 mL  6  . bisoprolol-hydrochlorothiazide (ZIAC) 5-6.25 MG per tablet Take 1 tablet by mouth daily.      . cyclobenzaprine (FLEXERIL) 10 MG tablet Take 10 mg by mouth 3 (three) times daily as needed. For muscle pain.      . DULoxetine (CYMBALTA) 60 MG capsule Take 60 mg by mouth daily.      Marland Kitchen esomeprazole (NEXIUM) 40 MG capsule Take 1 capsule (40 mg total) by mouth daily before breakfast.  30 capsule  6  . HYDROcodone-acetaminophen (VICODIN) 5-500 MG per tablet Take 1 tablet by mouth every 6 (six) hours as needed. For pain.      Marland Kitchen levothyroxine (SYNTHROID, LEVOTHROID) 100 MCG tablet Take 100 mcg by mouth daily.      Marland Kitchen lidocaine-prilocaine (EMLA) cream Apply 1 application topically as needed. For port-a-cath access.       Marland Kitchen LORazepam (ATIVAN) 0.5 MG tablet       . meloxicam (MOBIC) 15 MG tablet Take 15 mg by mouth daily.      . mometasone (NASONEX) 50 MCG/ACT nasal spray Place 2 sprays into the nose daily.      . ondansetron (ZOFRAN) 8 MG tablet Take 1 tablet (8 mg total) by mouth 2 (two) times daily. Take BID starting the day after chemo x 3 days. Then BID PRN nausea or vomiting.  30 tablet  1  . potassium chloride SA (K-DUR,KLOR-CON) 20 MEQ tablet Take 1 tablet (20 mEq total) by mouth daily.  30 tablet  0  . PRESCRIPTION MEDICATION Inject into the vein every 7 (seven) days. Herceptin infusion every Wednesday.      . prochlorperazine (COMPAZINE) 10 MG tablet       . promethazine (PHENERGAN) 25 MG tablet Take 1 tablet (25 mg total) by mouth every 6 (six) hours as needed for nausea.  60 tablet  0  . pseudoephedrine (SUDAFED) 30 MG tablet Take 30 mg by mouth every 4 (four) hours as needed.      . sucralfate (CARAFATE) 1 GM/10ML suspension Take 10 mLs (1 g total) by mouth 4 (four) times daily.  420 mL  0     Allergies  Allergen Reactions  . Acyclovir And Related   . Ciprofloxacin Hcl Rash  . Bactrim (Sulfamethoxazole W-Trimethoprim)     History   Social History  . Marital Status: Married    Spouse Name: N/A  Number of Children: N/A  . Years of Education: N/A   Occupational History  . Not on file.   Social History Main Topics  . Smoking status: Never Smoker   . Smokeless tobacco: Never Used  . Alcohol Use: 0.0 oz/week    0-1 Glasses of wine per week  . Drug Use: No  . Sexually Active: Not Currently   Other Topics Concern  . Not on file   Social History Narrative  . No narrative on file    Family History  Problem Relation Age of Onset  . Cancer Mother     "tumor of face removed"  . Cancer Father     kidney removed  . Cancer Maternal Aunt     stomach & Pancreatic  . Cancer Paternal Uncle     pancreatic    PHYSICAL EXAM: Filed Vitals:   06/04/12 1346  BP: 142/64  Pulse: 92    General:  Well appearing. No respiratory difficulty HEENT: normal Neck: supple. no JVD. Carotids 2+ bilat; no bruits. No lymphadenopathy or thryomegaly appreciated. Cor: PMI nondisplaced. Regular rate & rhythm. No rubs, gallops or murmurs. Lungs: clear Abdomen: soft, nontender, nondistended. No hepatosplenomegaly. No bruits or masses. Good bowel sounds. Extremities: no cyanosis, clubbing, rash, edema Neuro: alert & oriented x 3, cranial nerves grossly intact. moves all 4 extremities w/o difficulty. Affect pleasant.  ECG:  Results for orders placed in visit on 06/04/12 (from the past 24 hour(s))  CBC WITH DIFFERENTIAL     Status: Abnormal   Collection Time   06/04/12 10:38 AM      Component Value Range   WBC 8.4  3.9 - 10.3 10e3/uL   NEUT# 6.1  1.5 - 6.5 10e3/uL   HGB 9.8 (*) 11.6 - 15.9 g/dL   HCT 16.1 (*) 09.6 - 04.5 %   Platelets 132 (*) 145 - 400 10e3/uL   MCV 91.1  79.5 - 101.0 fL   MCH 31.8  25.1 - 34.0 pg   MCHC 35.0  31.5 - 36.0 g/dL   RBC 4.09 (*) 8.11 - 9.14 10e6/uL   RDW 19.1 (*) 11.2 - 14.5 %   lymph# 1.5  0.9 - 3.3 10e3/uL   MONO# 0.6  0.1 - 0.9 10e3/uL   Eosinophils Absolute 0.0  0.0 - 0.5 10e3/uL   Basophils Absolute 0.1  0.0 - 0.1 10e3/uL   NEUT% 72.4  38.4 - 76.8 %   LYMPH% 18.1  14.0 - 49.7 %   MONO% 7.6  0.0 - 14.0 %   EOS% 0.5  0.0 - 7.0 %   BASO% 1.4  0.0 - 2.0 %   Narrative:    Performed At:  [facility]               [address]               [place], [place] [address]  COMPREHENSIVE METABOLIC PANEL (CC13)     Status: Abnormal   Collection Time   06/04/12 10:38 AM      Component Value Range   Sodium 140  136 - 145 mEq/L   Potassium 3.4 (*) 3.5 - 5.1 mEq/L   Chloride 98  98 - 107 mEq/L   CO2 28  22 - 29 mEq/L   Glucose 90  70 - 99 mg/dl   BUN 62.1  7.0 - 30.8 mg/dL   Creatinine 1.5 (*) 0.6 - 1.1 mg/dL   Total Bilirubin 6.57  0.20 - 1.20 mg/dL  Alkaline Phosphatase 130  40 - 150 U/L   AST 27  5 - 34 U/L   ALT 17  0 - 55 U/L   Total  Protein 6.4  6.4 - 8.3 g/dL   Albumin 2.9 (*) 3.5 - 5.0 g/dL   Calcium 7.4 (*) 8.4 - 10.4 mg/dL   Narrative:    Performed At:  [facility]               [address]               [place], [place] [address]   No results found.   ASSESSMENT & PLAN:

## 2012-06-04 NOTE — Progress Notes (Signed)
Maria Mckinney [id number] 1950-09-14 62 y.o. 06/04/2012 2:20 PM  CC  Maria Ravel, MD Dr. Sharman Crate Mckinney [address] [place] [address] Dr. Emelia Mckinney Dr. Lurline Mckinney Dr. 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  PRIOR THERAPY:  #1Patient was originally seen in the multidisciplinary breast clinic for new diagnosis of left breast cancer that was 8mm in the upper outer quadrant of the left breast.  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  There was an additional 4 cm area of normal signal.  Patient did not get an MRI secondary to cochlear implant.  She desires breast conservation.    #2 Patient is s/p port-a-cath placement for neoadjuvant chemotherapy consisting of Taxotere/carboplatinum/Herceptin.  She will begin cycle #1 on 02/29/2012.  She will receive Taxotere and carboplatinum every 21 days with Herceptin giving weekly.  She will receive a total of 4 cycles of Taxotere and Carboplatinum--which completed on 05/03/12.    CURRENT THERAPY: Herceptin q3 weeks  INTERVAL HISTORY: [name] returns today for follow up.  She had some diarrhea last week, and I was concerned her potassium was low, we brought her in for labs, and she had AKI with a creatinine of 1.8.  She received IV fluids x 2 days.  She came in to see me today.  She has poor PO intake, and continues to have occasional diarrhea that she takes Imodium for.  She continues to be nauseated and has no appetite.  She does not take her anti-emetics until she is very nauseated.  She denies abd. Cramping, fevers, chills, or any other concerns.    Past Medical History: Past Medical History  Diagnosis Date  . HTN (hypertension)   . Fibromyalgia   . GERD (gastroesophageal reflux disease)   . Arthritis   . Anxiety   . Dysrhythmia      irreg hr  . Wears hearing aid     left ear  . 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 PONV (postoperative nausea and vomiting)     Past Surgical History: Past Surgical History  Procedure Date  . Appendectomy   . Combined hysteroscopy diagnostic / d&c   . Cochlear implant   . Portacath placement 02/22/2012    Procedure: INSERTION PORT-A-CATH;  Surgeon: Maria Loron, MD;  Location: [facility];  Service: General;  Laterality: Right;    Family History: Family History  Problem Relation Age of Onset  . Cancer Mother     "tumor of face removed"  . Cancer Father     kidney removed  . Cancer Maternal Aunt     stomach & Pancreatic  . Cancer Paternal Uncle     pancreatic    Social History History  Substance Use Topics  . Smoking status: Never Smoker   . Smokeless tobacco: Never Used  . Alcohol Use: 0.0 oz/week    0-1 Glasses of wine per week    Allergies: Allergies  Allergen Reactions  . Acyclovir And Related   . Ciprofloxacin Hcl Rash  . Bactrim (Sulfamethoxazole W-Trimethoprim)     Current Medications: Current Outpatient Prescriptions  Medication Sig Dispense Refill  . bisoprolol-hydrochlorothiazide (ZIAC) 5-6.25 MG per tablet Take 1 tablet by mouth daily.      . cyclobenzaprine (FLEXERIL) 10 MG tablet Take 10 mg by mouth 3 (three) times daily as  needed. For muscle pain.      . DULoxetine (CYMBALTA) 60 MG capsule Take 60 mg by mouth daily.      Marland Kitchen esomeprazole (NEXIUM) 40 MG capsule Take 1 capsule (40 mg total) by mouth daily before breakfast.  30 capsule  6  . HYDROcodone-acetaminophen (VICODIN) 5-500 MG per tablet Take 1 tablet by mouth every 6 (six) hours as needed. For pain.      Marland Kitchen levothyroxine (SYNTHROID, LEVOTHROID) 100 MCG tablet Take 100 mcg by mouth daily.      Marland Kitchen lidocaine-prilocaine (EMLA) cream Apply 1 application topically as needed. For port-a-cath access.      . meloxicam (MOBIC) 15 MG tablet Take 15 mg by mouth daily.      .  mometasone (NASONEX) 50 MCG/ACT nasal spray Place 2 sprays into the nose daily.      . ondansetron (ZOFRAN) 8 MG tablet Take 1 tablet (8 mg total) by mouth 2 (two) times daily. Take BID starting the day after chemo x 3 days. Then BID PRN nausea or vomiting.  30 tablet  1  . potassium chloride SA (K-DUR,KLOR-CON) 20 MEQ tablet Take 1 tablet (20 mEq total) by mouth daily.  30 tablet  0  . Alum & Mag Hydroxide-Simeth (MAGIC MOUTHWASH W/LIDOCAINE) SOLN Take 5 mLs by mouth 4 (four) times daily.  200 mL  6  . LORazepam (ATIVAN) 0.5 MG tablet       . PRESCRIPTION MEDICATION Inject into the vein every 7 (seven) days. Herceptin infusion every Wednesday.      . prochlorperazine (COMPAZINE) 10 MG tablet       . promethazine (PHENERGAN) 25 MG tablet Take 1 tablet (25 mg total) by mouth every 6 (six) hours as needed for nausea.  60 tablet  0  . pseudoephedrine (SUDAFED) 30 MG tablet Take 30 mg by mouth every 4 (four) hours as needed.      . sucralfate (CARAFATE) 1 GM/10ML suspension Take 10 mLs (1 g total) by mouth 4 (four) times daily.  420 mL  0    Health Maintenance:  ColonoscopyShe has never had a colonoscopy Bone DensityBone density about 10-12 years ago Last PAP smearLast Pap smear was in 2012   REVIEW OF SYSTEMS: General: fatigue (-), night sweats (-), fever (-), pain (-) Lymph: palpable nodes (-) HEENT: vision changes (-), mucositis (-), gum bleeding (-), epistaxis (-) Cardiovascular: chest pain (-), palpitations (-) Pulmonary: shortness of breath (-), dyspnea on exertion (-), cough (-), hemoptysis (-) GI:  Early satiety (-), melena (-), dysphagia (-), nausea/vomiting (+), diarrhea (+) GU: dysuria (-), hematuria (-), incontinence (-) Musculoskeletal: joint swelling (-), joint pain (-), back pain (-) Neuro: weakness (-), numbness (-), headache (-), confusion (-) Skin: Rash (-), lesions (-), dryness (-) Psych: depression (-), suicidal/homicidal ideation (-), feeling of hopelessness  (-)   PHYSICAL EXAMINATION: Blood pressure 102/65, pulse 90, temperature 98.4 F (36.9 C), resp. rate 20, height 5\' 2"  (1.575 m), weight 170 lb 11.2 oz (77.429 kg). General: Patient is a well appearing female in no acute distress HEENT: PERRLA, sclerae anicteric no conjunctival pallor, MMM,  Neck: supple, no palpable adenopathy Lungs: clear to auscultation bilaterally, no wheezes, rhonchi, or rales Cardiovascular: regular rate rhythm, S1, S2, no murmurs, rubs or gallops,  Abdomen: Soft, non-tender, non-distended, normoactive bowel sounds, no HSM Extremities: warm and well perfused, no clubbing, cyanosis, or edema Skin: No rashes or lesions Neuro: Non-focal ECOG PERFORMANCE STATUS: 1 - Symptomatic but completely ambulatory Breasts: unable to palpate left  breast mass, no nodularity, right breast: no masses or nodularity  STUDIES/RESULTS: No results found.   LABS:    Chemistry      Component Value Date/Time   NA 140 06/04/2012 1038   NA 133* 05/21/2012 0644   K 3.4* 06/04/2012 1038   K 3.9 05/21/2012 0644   CL 98 06/04/2012 1038   CL 97 05/21/2012 0644   CO2 28 06/04/2012 1038   CO2 25 05/21/2012 0644   BUN 10.3 06/04/2012 1038   BUN 6 05/21/2012 0644   CREATININE 1.5* 06/04/2012 1038   CREATININE 1.01 05/21/2012 0644      Component Value Date/Time   CALCIUM 7.4* 06/04/2012 1038   CALCIUM 8.2* 05/21/2012 0644   ALKPHOS 130 06/04/2012 1038   ALKPHOS 59 05/10/2012 1909   AST 27 06/04/2012 1038   AST 30 05/10/2012 1909   ALT 17 06/04/2012 1038   ALT 69* 05/10/2012 1909   BILITOT 1.03 06/04/2012 1038   BILITOT 3.9* 05/10/2012 1909      Lab Results  Component Value Date   WBC 8.4 06/04/2012   HGB 9.8* 06/04/2012   HCT 28.2* 06/04/2012   MCV 91.1 06/04/2012   PLT 132* 06/04/2012      PATHOLOGY:  ADDITIONAL INFORMATION: PROGNOSTIC INDICATORS - ACIS Results IMMUNOHISTOCHEMICAL AND MORPHOMETRIC ANALYSIS BY THE AUTOMATED CELLULAR IMAGING SYSTEM (ACIS) Estrogen Receptor (Negative, <1%): 0%,  NEGATIVE Progesterone Receptor (Negative, <1%): 0%, NEGATIVE Proliferation Marker Ki67 by M IB-1 (Low<20%): 98% All controls stained appropriately Pecola Leisure MD Pathologist, Electronic Signature ( Signed 02/15/2012) CHROMOGENIC IN-SITU HYBRIDIZATION Interpretation: HER2/NEU BY CISH - SHOWS AMPLIFICATION BY CISH ANALYSIS. THE RATIO OF HER2: CEP 17 SIGNALS WAS 2.55. 40 TUMOR CELLS WERE SCORED. OF NOTE, THE TUMOR APPEARS TO SHOW HETEROGENEITY IN REGARDS TO HER2 EXPRESSION. Reference range: Ratio: HER2:CEP17 < 1.8 gene amplification not observed Ratio: HER2:CEP 17 1.8-2.2 - equivocal result Ratio: HER2:CEP17 > 2.2 - gene amplification observed Pecola Leisure MD Pathologist, Electronic Signature ( Signed 02/15/2012) 1 of ADDITIONAL INFORMATION: PROGNOSTIC INDICATORS - ACIS Results IMMUNOHISTOCHEMICAL AND MORPHOMETRIC ANALYSIS BY THE AUTOMATED CELLULAR IMAGING SYSTEM (ACIS) Estrogen Receptor (Negative, <1%): 0%, NEGATIVE Progesterone Receptor (Negative, <1%): 0%, NEGATIVE COMMENT: The negative hormone receptor study(ies) in this case have an internal positive control. All controls stained appropriately Abigail Miyamoto MD Pathologist, Electronic Signature ( Signed 02/22/2012)  ASSESSMENT #1 Stage 1 HER-2 positive left breast cancer ER negative PR negative.  Patient is being considered for neoadjuvant chemotherapy consisting of Taxotere carboplatinum and herceptin as she does want breast conservation.  She understands the risks and benefits of this approach and the side effects of her chemotherapy and Herceptin.  #2 She understands she will receive neulasta the day after chemotherapy to keep her white count up.    #3 She does have thrombocytopenia and we will monitor this  #4 Dehydration  #5 Hypokalemia  PLAN:  #1 Ms. Hotz has completed the neoadjuvant portion of her chemotherapy.  She has f/u with Dr. Dwain Sarna on 06/07/12.  Her mammogram was completed at [facility].  The area appears  less dense.    #2Ms. Antigua's creatinine is now down to 1.5.  We will continue to hydrate her every day this week.  I instructed her to take her nausea medication twice a day scheduled, whether or not she felt nauseated.  I ordered Carafate four times a day, as well as a CT brain to r/o any involvement.    #3  I will get another BMP on Ms. Supple Friday.  Should she have any more diarrhea, I will get stool studies.    #4 We will see her for her next herceptin on 06/13/12.    All questions were answered. The patient knows to call the clinic with any problems, questions or concerns. We can certainly see the patient much sooner if necessary.  I spent 25 minutes counseling the patient face to face. The total time spent in the appointment was 30 minutes.  Cherie Ouch Lyn Hollingshead, NP Medical Oncology [facility] Phone: [phone] 06/04/2012, 2:20 PM

## 2012-06-04 NOTE — Assessment & Plan Note (Addendum)
Explained the purpose of the HF clinic as it relates to breast cancer treatment. Discussed ~10% of women have cardiotoxic effect from Herceptin. Dr [name] reviewed and discussed ECHO results. ECHO and lateral S' stable. No evidence of cardiotoxicity. Follow up in 3 months with an ECHO.   Patient seen and examined with Tonye Becket, NP. We discussed all aspects of the encounter. I agree with the assessment and plan as stated above. Risk of trastuzamab cardiotoxicity and role of cardio-oncology clinic reviewed in detail. Echo reviewed personally. She is OK to continue Herceptin. Will follow every 3 months.

## 2012-06-04 NOTE — Patient Instructions (Addendum)
Please take Zofran once in the morning and compazine once in the evening.  Drink 4 bottles of water a day if possible.  We will see you back for IV fluids.  I ordered Carafate to help coat your stomach and a CT of your brain to make sure nothing else is causing the nausea.  Please call us if you have any questions or concerns.

## 2012-06-04 NOTE — Telephone Encounter (Signed)
Added IVF for 2/4 thru 2/8. gv pt new schedule. Pt already on schedule for f/u next wk.

## 2012-06-04 NOTE — Patient Instructions (Addendum)
Follow up in 3 months with an ECHO 

## 2012-06-05 ENCOUNTER — Ambulatory Visit (HOSPITAL_BASED_OUTPATIENT_CLINIC_OR_DEPARTMENT_OTHER): Payer: BC Managed Care – PPO

## 2012-06-05 ENCOUNTER — Telehealth: Payer: Self-pay | Admitting: Medical Oncology

## 2012-06-05 DIAGNOSIS — N179 Acute kidney failure, unspecified: Secondary | ICD-10-CM

## 2012-06-05 DIAGNOSIS — E860 Dehydration: Secondary | ICD-10-CM

## 2012-06-05 MED ORDER — SODIUM CHLORIDE 0.9 % IV SOLN
Freq: Once | INTRAVENOUS | Status: AC
  Administered 2012-06-05: 15:00:00 via INTRAVENOUS

## 2012-06-05 NOTE — Patient Instructions (Signed)
Dehydration, Adult Dehydration is when you lose more fluids from the body than you take in. Vital organs like the kidneys, brain, and heart cannot function without a proper amount of fluids and salt. Any loss of fluids from the body can cause dehydration.  CAUSES   Vomiting.  Diarrhea.  Excessive sweating.  Excessive urine output.  Fever. SYMPTOMS  Mild dehydration  Thirst.  Dry lips.  Slightly dry mouth. Moderate dehydration  Very dry mouth.  Sunken eyes.  Skin does not bounce back quickly when lightly pinched and released.  Dark urine and decreased urine production.  Decreased tear production.  Headache. Severe dehydration  Very dry mouth.  Extreme thirst.  Rapid, weak pulse (more than 100 beats per minute at rest).  Cold hands and feet.  Not able to sweat in spite of heat and temperature.  Rapid breathing.  Blue lips.  Confusion and lethargy.  Difficulty being awakened.  Minimal urine production.  No tears. DIAGNOSIS  Your caregiver will diagnose dehydration based on your symptoms and your exam. Blood and urine tests will help confirm the diagnosis. The diagnostic evaluation should also identify the cause of dehydration. TREATMENT  Treatment of mild or moderate dehydration can often be done at home by increasing the amount of fluids that you drink. It is best to drink small amounts of fluid more often. Drinking too much at one time can make vomiting worse. Refer to the home care instructions below. Severe dehydration needs to be treated at the hospital where you will probably be given intravenous (IV) fluids that contain water and electrolytes. HOME CARE INSTRUCTIONS   Ask your caregiver about specific rehydration instructions.  Drink enough fluids to keep your urine clear or pale yellow.  Drink small amounts frequently if you have nausea and vomiting.  Eat as you normally do.  Avoid:  Foods or drinks high in sugar.  Carbonated  drinks.  Juice.  Extremely hot or cold fluids.  Drinks with caffeine.  Fatty, greasy foods.  Alcohol.  Tobacco.  Overeating.  Gelatin desserts.  Wash your hands well to avoid spreading bacteria and viruses.  Only take over-the-counter or prescription medicines for pain, discomfort, or fever as directed by your caregiver.  Ask your caregiver if you should continue all prescribed and over-the-counter medicines.  Keep all follow-up appointments with your caregiver. SEEK MEDICAL CARE IF:  You have abdominal pain and it increases or stays in one area (localizes).  You have a rash, stiff neck, or severe headache.  You are irritable, sleepy, or difficult to awaken.  You are weak, dizzy, or extremely thirsty. SEEK IMMEDIATE MEDICAL CARE IF:   You are unable to keep fluids down or you get worse despite treatment.  You have frequent episodes of vomiting or diarrhea.  You have blood or green matter (bile) in your vomit.  You have blood in your stool or your stool looks black and tarry.  You have not urinated in 6 to 8 hours, or you have only urinated a small amount of very dark urine.  You have a fever.  You faint. MAKE SURE YOU:   Understand these instructions.  Will watch your condition.  Will get help right away if you are not doing well or get worse. Document Released: 04/18/2005 Document Revised: 07/11/2011 Document Reviewed: 12/06/2010 Exit[employer] Patient Information 2013 [employer].  

## 2012-06-05 NOTE — Telephone Encounter (Signed)
Patient inquiring d/t her recent potassium results should she continue to take the 1 tablet of potassium like you has been? Per NP called pt, LVMOM, for patient to continue taking the one tablet of potassium. Patient advised should she have further questions or concerns to pls call our office.

## 2012-06-06 ENCOUNTER — Ambulatory Visit (HOSPITAL_COMMUNITY)
Admission: RE | Admit: 2012-06-06 | Discharge: 2012-06-06 | Disposition: A | Discharge status: Home / Self Care | Payer: BC Managed Care – PPO | Source: Ambulatory Visit | Attending: Adult Health | Admitting: Adult Health

## 2012-06-06 ENCOUNTER — Ambulatory Visit (HOSPITAL_BASED_OUTPATIENT_CLINIC_OR_DEPARTMENT_OTHER): Payer: BC Managed Care – PPO

## 2012-06-06 ENCOUNTER — Other Ambulatory Visit: Payer: Self-pay | Admitting: Adult Health

## 2012-06-06 ENCOUNTER — Encounter (HOSPITAL_COMMUNITY): Payer: Self-pay

## 2012-06-06 VITALS — BP 137/60 | HR 85 | Temp 98.5°F

## 2012-06-06 DIAGNOSIS — R42 Dizziness and giddiness: Secondary | ICD-10-CM | POA: Insufficient documentation

## 2012-06-06 DIAGNOSIS — I672 Cerebral atherosclerosis: Secondary | ICD-10-CM | POA: Insufficient documentation

## 2012-06-06 DIAGNOSIS — N179 Acute kidney failure, unspecified: Secondary | ICD-10-CM

## 2012-06-06 DIAGNOSIS — C50919 Malignant neoplasm of unspecified site of unspecified female breast: Secondary | ICD-10-CM | POA: Insufficient documentation

## 2012-06-06 DIAGNOSIS — R112 Nausea with vomiting, unspecified: Secondary | ICD-10-CM

## 2012-06-06 DIAGNOSIS — C50419 Malignant neoplasm of upper-outer quadrant of unspecified female breast: Secondary | ICD-10-CM

## 2012-06-06 DIAGNOSIS — E860 Dehydration: Secondary | ICD-10-CM

## 2012-06-06 DIAGNOSIS — Z79899 Other long term (current) drug therapy: Secondary | ICD-10-CM | POA: Insufficient documentation

## 2012-06-06 MED ORDER — SODIUM CHLORIDE 0.9 % IV SOLN
Freq: Once | INTRAVENOUS | Status: AC
  Administered 2012-06-06: 1000 mL via INTRAVENOUS

## 2012-06-06 MED ORDER — IOHEXOL 300 MG/ML  SOLN
100.0000 mL | Freq: Once | INTRAMUSCULAR | Status: AC | PRN
  Administered 2012-06-06: 100 mL via INTRAVENOUS

## 2012-06-06 NOTE — Patient Instructions (Addendum)
[facility] Discharge Instructions for Patients  Today you received the following: IV fluids   BELOW ARE SYMPTOMS THAT SHOULD BE REPORTED IMMEDIATELY:  *FEVER GREATER THAN 100.5 F  *CHILLS WITH OR WITHOUT FEVER  NAUSEA AND VOMITING THAT IS NOT CONTROLLED WITH YOUR NAUSEA MEDICATION  *UNUSUAL SHORTNESS OF BREATH  *UNUSUAL BRUISING OR BLEEDING  TENDERNESS IN MOUTH AND THROAT WITH OR WITHOUT PRESENCE OF ULCERS  *URINARY PROBLEMS  *BOWEL PROBLEMS  UNUSUAL RASH Items with * indicate a potential emergency and should be followed up as soon as possible.  Feel free to call the clinic you have any questions or concerns. The clinic phone number is [phone].    

## 2012-06-07 ENCOUNTER — Ambulatory Visit (HOSPITAL_BASED_OUTPATIENT_CLINIC_OR_DEPARTMENT_OTHER): Payer: BC Managed Care – PPO

## 2012-06-07 ENCOUNTER — Encounter (INDEPENDENT_AMBULATORY_CARE_PROVIDER_SITE_OTHER): Payer: BC Managed Care – PPO | Admitting: General Surgery

## 2012-06-07 ENCOUNTER — Other Ambulatory Visit: Payer: Self-pay | Admitting: Adult Health

## 2012-06-07 VITALS — BP 121/54 | HR 87 | Temp 98.4°F | Resp 20

## 2012-06-07 DIAGNOSIS — E860 Dehydration: Secondary | ICD-10-CM

## 2012-06-07 DIAGNOSIS — N179 Acute kidney failure, unspecified: Secondary | ICD-10-CM

## 2012-06-07 MED ORDER — SODIUM CHLORIDE 0.9 % IV SOLN
Freq: Once | INTRAVENOUS | Status: AC
  Administered 2012-06-07: 15:00:00 via INTRAVENOUS

## 2012-06-07 MED ORDER — SODIUM CHLORIDE 0.9 % IV SOLN: Freq: Once | INTRAVENOUS | Status: DC

## 2012-06-07 NOTE — Progress Notes (Signed)
Right upper chest at portacath site reddened prior to IV accessed today.  Pt stated she noticed the irritation yesterday when pt went home.   No bleeding noted.   Opsite dressing applied after port accessed.  When pt completed IVF,  Nurse noted site around port still reddened.   Lillia Abed, NP came to assess pt.  No new orders.  Instructed pt to leave area opened to air and to keep area clean and dry.   Pt voiced understanding.

## 2012-06-07 NOTE — Patient Instructions (Addendum)
Dehydration, Adult Dehydration is when you lose more fluids from the body than you take in. Vital organs like the kidneys, brain, and heart cannot function without a proper amount of fluids and salt. Any loss of fluids from the body can cause dehydration.  CAUSES   Vomiting.  Diarrhea.  Excessive sweating.  Excessive urine output.  Fever. SYMPTOMS  Mild dehydration  Thirst.  Dry lips.  Slightly dry mouth. Moderate dehydration  Very dry mouth.  Sunken eyes.  Skin does not bounce back quickly when lightly pinched and released.  Dark urine and decreased urine production.  Decreased tear production.  Headache. Severe dehydration  Very dry mouth.  Extreme thirst.  Rapid, weak pulse (more than 100 beats per minute at rest).  Cold hands and feet.  Not able to sweat in spite of heat and temperature.  Rapid breathing.  Blue lips.  Confusion and lethargy.  Difficulty being awakened.  Minimal urine production.  No tears. DIAGNOSIS  Your caregiver will diagnose dehydration based on your symptoms and your exam. Blood and urine tests will help confirm the diagnosis. The diagnostic evaluation should also identify the cause of dehydration. TREATMENT  Treatment of mild or moderate dehydration can often be done at home by increasing the amount of fluids that you drink. It is best to drink small amounts of fluid more often. Drinking too much at one time can make vomiting worse. Refer to the home care instructions below. Severe dehydration needs to be treated at the hospital where you will probably be given intravenous (IV) fluids that contain water and electrolytes. HOME CARE INSTRUCTIONS   Ask your caregiver about specific rehydration instructions.  Drink enough fluids to keep your urine clear or pale yellow.  Drink small amounts frequently if you have nausea and vomiting.  Eat as you normally do.  Avoid:  Foods or drinks high in sugar.  Carbonated  drinks.  Juice.  Extremely hot or cold fluids.  Drinks with caffeine.  Fatty, greasy foods.  Alcohol.  Tobacco.  Overeating.  Gelatin desserts.  Wash your hands well to avoid spreading bacteria and viruses.  Only take over-the-counter or prescription medicines for pain, discomfort, or fever as directed by your caregiver.  Ask your caregiver if you should continue all prescribed and over-the-counter medicines.  Keep all follow-up appointments with your caregiver. SEEK MEDICAL CARE IF:  You have abdominal pain and it increases or stays in one area (localizes).  You have a rash, stiff neck, or severe headache.  You are irritable, sleepy, or difficult to awaken.  You are weak, dizzy, or extremely thirsty. SEEK IMMEDIATE MEDICAL CARE IF:   You are unable to keep fluids down or you get worse despite treatment.  You have frequent episodes of vomiting or diarrhea.  You have blood or green matter (bile) in your vomit.  You have blood in your stool or your stool looks black and tarry.  You have not urinated in 6 to 8 hours, or you have only urinated a small amount of very dark urine.  You have a fever.  You faint. MAKE SURE YOU:   Understand these instructions.  Will watch your condition.  Will get help right away if you are not doing well or get worse. Document Released: 04/18/2005 Document Revised: 07/11/2011 Document Reviewed: 12/06/2010 [employer] Patient Information 2013 [employer].  

## 2012-06-08 ENCOUNTER — Ambulatory Visit (INDEPENDENT_AMBULATORY_CARE_PROVIDER_SITE_OTHER): Payer: BC Managed Care – PPO | Admitting: General Surgery

## 2012-06-08 ENCOUNTER — Ambulatory Visit (HOSPITAL_BASED_OUTPATIENT_CLINIC_OR_DEPARTMENT_OTHER): Payer: BC Managed Care – PPO | Admitting: Lab

## 2012-06-08 ENCOUNTER — Encounter (INDEPENDENT_AMBULATORY_CARE_PROVIDER_SITE_OTHER): Payer: Self-pay | Admitting: General Surgery

## 2012-06-08 ENCOUNTER — Telehealth: Payer: Self-pay | Admitting: Oncology

## 2012-06-08 ENCOUNTER — Ambulatory Visit (HOSPITAL_BASED_OUTPATIENT_CLINIC_OR_DEPARTMENT_OTHER): Payer: BC Managed Care – PPO

## 2012-06-08 VITALS — BP 130/76 | HR 84 | Temp 97.7°F | Resp 18 | Ht 62.0 in | Wt 174.0 lb

## 2012-06-08 VITALS — BP 129/68 | HR 79

## 2012-06-08 DIAGNOSIS — C50419 Malignant neoplasm of upper-outer quadrant of unspecified female breast: Secondary | ICD-10-CM

## 2012-06-08 DIAGNOSIS — N179 Acute kidney failure, unspecified: Secondary | ICD-10-CM

## 2012-06-08 DIAGNOSIS — E860 Dehydration: Secondary | ICD-10-CM

## 2012-06-08 LAB — BASIC METABOLIC PANEL (CC13)
BUN: 6.1 mg/dL — ABNORMAL LOW (ref 7.0–26.0)
Potassium: 3 mEq/L — CL (ref 3.5–5.1)
Sodium: 142 mEq/L (ref 136–145)

## 2012-06-08 MED ORDER — SODIUM CHLORIDE 0.9 % IJ SOLN
10.0000 mL | INTRAMUSCULAR | Status: DC | PRN
  Administered 2012-06-08: 10 mL via INTRAVENOUS
  Filled 2012-06-08: qty 10

## 2012-06-08 MED ORDER — HEPARIN SOD (PORK) LOCK FLUSH 100 UNIT/ML IV SOLN
500.0000 [IU] | Freq: Once | INTRAVENOUS | Status: AC
  Administered 2012-06-08: 500 [IU] via INTRAVENOUS
  Filled 2012-06-08: qty 5

## 2012-06-08 MED ORDER — SODIUM CHLORIDE 0.9 % IV SOLN
Freq: Once | INTRAVENOUS | Status: AC
  Administered 2012-06-08: 11:00:00 via INTRAVENOUS

## 2012-06-08 NOTE — Progress Notes (Signed)
Patient ID: Maria Mckinney, female   DOB: 1950-11-10, 62 y.o.   MRN: [id number]  Chief Complaint  Patient presents with  . Routine Post Op    port a cath 02/22/12    HPI Maria Mckinney is a 62 y.o. female.   HPI This is a 62 year old female I initially saw in the thoracic multidisciplinary clinic in October of 2013. At that time she had undergone a mammogram and ultrasound that showed a linear asymmetry in the upper outer quadrant of the left breast. She underwent stereotactic biopsy and initially a clip migrated. 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 Her proliferation index was 85%. There was a larger area on her mammogram which ended up being about 4 x 2 cm and a cylinder shaped but we discussed proceeding with primary systemic therapy and make that smaller if she very much wanted to undergo breast conservation therapy. She has completed her chemotherapy in early January. She is been continued on herceptin with the port I placed. She was hospitalized for 11 days at the end of January. This was after she developed some significant nausea, urinary tract infection was sounds like neutropenia. She left the hospital on January 20. She remains very weak and is getting daily IV fluids at the cancer center still. She is very slowly regaining her strength at this point. During our visit today she actually stood up and tripped and I call her so she did not fall. She is not very steady on her feet. She comes in today to discuss her surgery options.  Past Medical History  Diagnosis Date  . HTN (hypertension)   . Fibromyalgia   . GERD (gastroesophageal reflux disease)   . Arthritis   . Anxiety   . Dysrhythmia     irreg hr  . Wears hearing aid     left ear  . 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 PONV (postoperative nausea and vomiting)     Past Surgical History  Procedure Date  .  Appendectomy   . Combined hysteroscopy diagnostic / d&c   . Cochlear implant   . Portacath placement 02/22/2012    Procedure: INSERTION PORT-A-CATH;  Surgeon: [name], MD;  Location: [facility];  Service: General;  Laterality: Right;    Family History  Problem Relation Age of Onset  . Cancer Mother     "tumor of face removed"  . Cancer Father     kidney removed  . Cancer Maternal Aunt     stomach & Pancreatic  . Cancer Paternal Uncle     pancreatic    Social History History  Substance Use Topics  . Smoking status: Never Smoker   . Smokeless tobacco: Never Used  . Alcohol Use: 0.0 oz/week    0-1 Glasses of wine per week    Allergies  Allergen Reactions  . Acyclovir And Related   . Ciprofloxacin Hcl Rash  . Bactrim (Sulfamethoxazole W-Trimethoprim)     Current Outpatient Prescriptions  Medication Sig Dispense Refill  . bisoprolol-hydrochlorothiazide (ZIAC) 5-6.25 MG per tablet Take 1 tablet by mouth daily.      . cyclobenzaprine (FLEXERIL) 10 MG tablet Take 10 mg by mouth 3 (three) times daily as needed. For muscle pain.      . DULoxetine (CYMBALTA) 60 MG capsule Take 60 mg by mouth daily.      Marland Kitchen esomeprazole (NEXIUM)  40 MG capsule Take 1 capsule (40 mg total) by mouth daily before breakfast.  30 capsule  6  . HYDROcodone-acetaminophen (VICODIN) 5-500 MG per tablet Take 1 tablet by mouth every 6 (six) hours as needed. For pain.      Marland Kitchen levothyroxine (SYNTHROID, LEVOTHROID) 100 MCG tablet Take 100 mcg by mouth daily.      Marland Kitchen lidocaine-prilocaine (EMLA) cream Apply 1 application topically as needed. For port-a-cath access.      Marland Kitchen LORazepam (ATIVAN) 0.5 MG tablet       . meloxicam (MOBIC) 15 MG tablet Take 15 mg by mouth daily.      . mometasone (NASONEX) 50 MCG/ACT nasal spray Place 2 sprays into the nose daily.      . ondansetron (ZOFRAN) 8 MG tablet Take 1 tablet (8 mg total) by mouth 2 (two) times daily. Take BID starting the day after chemo x 3  days. Then BID PRN nausea or vomiting.  30 tablet  1  . potassium chloride SA (K-DUR,KLOR-CON) 20 MEQ tablet Take 1 tablet (20 mEq total) by mouth daily.  30 tablet  0  . PRESCRIPTION MEDICATION Inject into the vein every 7 (seven) days. Herceptin infusion every Wednesday.      . prochlorperazine (COMPAZINE) 10 MG tablet       . promethazine (PHENERGAN) 25 MG tablet Take 1 tablet (25 mg total) by mouth every 6 (six) hours as needed for nausea.  60 tablet  0  . pseudoephedrine (SUDAFED) 30 MG tablet Take 30 mg by mouth every 4 (four) hours as needed.      . sucralfate (CARAFATE) 1 GM/10ML suspension Take 10 mLs (1 g total) by mouth 4 (four) times daily.  420 mL  0  . Alum & Mag Hydroxide-Simeth (MAGIC MOUTHWASH W/LIDOCAINE) SOLN Take 5 mLs by mouth 4 (four) times daily.  200 mL  6   No current facility-administered medications for this visit.   Facility-Administered Medications Ordered in Other Visits  Medication Dose Route Frequency Provider Last Rate Last Dose  . sodium chloride 0.9 % injection 10 mL  10 mL Intravenous PRN Augustin Schooling, NP   10 mL at 06/08/12 1231    Review of Systems Review of Systems  Constitutional: Positive for appetite change and fatigue. Negative for fever, chills and unexpected weight change.  HENT: Negative for hearing loss, congestion, sore throat, trouble swallowing and voice change.   Eyes: Negative for visual disturbance.  Respiratory: Negative for cough and wheezing.   Cardiovascular: Negative for chest pain, palpitations and leg swelling.  Gastrointestinal: Positive for nausea. Negative for vomiting, abdominal pain, diarrhea, constipation, blood in stool, abdominal distention and anal bleeding.  Genitourinary: Negative for hematuria, vaginal bleeding and difficulty urinating.  Musculoskeletal: Negative for arthralgias.  Skin: Negative for rash and wound.  Neurological: Negative for seizures, syncope and headaches.  Hematological: Negative for  adenopathy. Does not bruise/bleed easily.  Psychiatric/Behavioral: Negative for confusion.    Blood pressure 130/76, pulse 84, temperature 97.7 F (36.5 C), resp. rate 18, height 5\' 2"  (1.575 m), weight 174 lb (78.926 kg).  Physical Exam Physical Exam  Vitals reviewed. Constitutional: She appears well-developed and well-nourished.  Neck: Neck supple.  Cardiovascular: Normal rate, regular rhythm and normal heart sounds.   Pulmonary/Chest: Effort normal. She has no wheezes. She has no rales. Right breast exhibits no inverted nipple, no mass, no nipple discharge, no skin change and no tenderness. Left breast exhibits no inverted nipple, no mass, no nipple discharge, no skin change  and no tenderness. Breasts are symmetrical.  Abdominal: Soft. There is no tenderness.  Lymphadenopathy:    She has no cervical adenopathy.    Data Reviewed Latest mmg from [facility]  Assessment    Left breast cancer s/p primary chemotherapy    Plan    Left breast wire guided lumpectomy, left axillary sentinel node biopsy I will discuss with medical oncology but I think it is best to wait to 3 weeks to do her surgery given her recent hospitalization issues she's had. I will plan on scheduling her for her left breast wire-guided lumpectomy and left axillary sentinel lymph node biopsy in 2-3 weeks. We discussed the staging and pathophysiology of breast cancer. We discussed all of the different options for treatment for breast cancer including surgery, chemotherapy, radiation therapy, Herceptin, and antiestrogen therapy.   We discussed a sentinel lymph node biopsy as she does not appear to having lymph node involvement right now. We discussed the performance of that with injection of radioactive tracer and blue dye. We discussed that she would have an incision underneath her axillary hairline. We discussed that there is a bout a 10-20% chance of having a positive node with a sentinel lymph node biopsy and we will await  the permanent pathology to make any other first further decisions in terms of her treatment. One of these options might be to return to the operating room to perform an axillary lymph node dissection. We discussed about a 1-2% risk lifetime of chronic shoulder pain as well as lymphedema associated with a sentinel lymph node biopsy.  We discussed the options for treatment of the breast cancer which included lumpectomy versus a mastectomy. We discussed the performance of the lumpectomy with a wire placement. We discussed a 10-20% chance of a positive margin requiring reexcision in the operating room. We also discussed that she may need radiation therapy or antiestrogen therapy or both if she undergoes lumpectomy. We discussed the mastectomy and the postoperative care for that as well. We discussed that there is no difference in her survival whether she undergoes lumpectomy with radiation therapy or antiestrogen therapy versus a mastectomy. There is a slight difference in the local recurrence rate being 3-5% with lumpectomy and about 1% with a mastectomy. We discussed the risks of operation including bleeding, infection, possible reoperation. She understands her further therapy will be based on what her stages at the time of her operation.         Maria Mckinney 06/08/2012, 2:17 PM

## 2012-06-08 NOTE — Patient Instructions (Addendum)
Dehydration, Adult Dehydration is when you lose more fluids from the body than you take in. Vital organs like the kidneys, brain, and heart cannot function without a proper amount of fluids and salt. Any loss of fluids from the body can cause dehydration.  CAUSES   Vomiting.  Diarrhea.  Excessive sweating.  Excessive urine output.  Fever. SYMPTOMS  Mild dehydration  Thirst.  Dry lips.  Slightly dry mouth. Moderate dehydration  Very dry mouth.  Sunken eyes.  Skin does not bounce back quickly when lightly pinched and released.  Dark urine and decreased urine production.  Decreased tear production.  Headache. Severe dehydration  Very dry mouth.  Extreme thirst.  Rapid, weak pulse (more than 100 beats per minute at rest).  Cold hands and feet.  Not able to sweat in spite of heat and temperature.  Rapid breathing.  Blue lips.  Confusion and lethargy.  Difficulty being awakened.  Minimal urine production.  No tears. DIAGNOSIS  Your caregiver will diagnose dehydration based on your symptoms and your exam. Blood and urine tests will help confirm the diagnosis. The diagnostic evaluation should also identify the cause of dehydration. TREATMENT  Treatment of mild or moderate dehydration can often be done at home by increasing the amount of fluids that you drink. It is best to drink small amounts of fluid more often. Drinking too much at one time can make vomiting worse. Refer to the home care instructions below. Severe dehydration needs to be treated at the hospital where you will probably be given intravenous (IV) fluids that contain water and electrolytes. HOME CARE INSTRUCTIONS   Ask your caregiver about specific rehydration instructions.  Drink enough fluids to keep your urine clear or pale yellow.  Drink small amounts frequently if you have nausea and vomiting.  Eat as you normally do.  Avoid:  Foods or drinks high in sugar.  Carbonated  drinks.  Juice.  Extremely hot or cold fluids.  Drinks with caffeine.  Fatty, greasy foods.  Alcohol.  Tobacco.  Overeating.  Gelatin desserts.  Wash your hands well to avoid spreading bacteria and viruses.  Only take over-the-counter or prescription medicines for pain, discomfort, or fever as directed by your caregiver.  Ask your caregiver if you should continue all prescribed and over-the-counter medicines.  Keep all follow-up appointments with your caregiver. SEEK MEDICAL CARE IF:  You have abdominal pain and it increases or stays in one area (localizes).  You have a rash, stiff neck, or severe headache.  You are irritable, sleepy, or difficult to awaken.  You are weak, dizzy, or extremely thirsty. SEEK IMMEDIATE MEDICAL CARE IF:   You are unable to keep fluids down or you get worse despite treatment.  You have frequent episodes of vomiting or diarrhea.  You have blood or green matter (bile) in your vomit.  You have blood in your stool or your stool looks black and tarry.  You have not urinated in 6 to 8 hours, or you have only urinated a small amount of very dark urine.  You have a fever.  You faint. MAKE SURE YOU:   Understand these instructions.  Will watch your condition.  Will get help right away if you are not doing well or get worse. Document Released: 04/18/2005 Document Revised: 07/11/2011 Document Reviewed: 12/06/2010 [employer] Patient Information 2013 [employer].  

## 2012-06-09 ENCOUNTER — Ambulatory Visit (HOSPITAL_BASED_OUTPATIENT_CLINIC_OR_DEPARTMENT_OTHER): Payer: BC Managed Care – PPO

## 2012-06-09 VITALS — BP 129/58 | HR 82 | Temp 98.2°F

## 2012-06-09 DIAGNOSIS — R5381 Other malaise: Secondary | ICD-10-CM

## 2012-06-09 DIAGNOSIS — R110 Nausea: Secondary | ICD-10-CM

## 2012-06-09 DIAGNOSIS — C50419 Malignant neoplasm of upper-outer quadrant of unspecified female breast: Secondary | ICD-10-CM

## 2012-06-09 DIAGNOSIS — E860 Dehydration: Secondary | ICD-10-CM

## 2012-06-09 DIAGNOSIS — C50919 Malignant neoplasm of unspecified site of unspecified female breast: Secondary | ICD-10-CM

## 2012-06-09 MED ORDER — SODIUM CHLORIDE 0.9 % IV SOLN
Freq: Once | INTRAVENOUS | Status: AC
Start: 2012-06-09 — End: 2012-06-09
  Administered 2012-06-09: 09:00:00 via INTRAVENOUS

## 2012-06-09 MED ORDER — HEPARIN SOD (PORK) LOCK FLUSH 100 UNIT/ML IV SOLN
500.0000 [IU] | Freq: Once | INTRAVENOUS | Status: AC
  Administered 2012-06-09: 500 [IU] via INTRAVENOUS
  Filled 2012-06-09: qty 5

## 2012-06-09 MED ORDER — SODIUM CHLORIDE 0.9 % IJ SOLN
10.0000 mL | INTRAMUSCULAR | Status: DC | PRN
  Administered 2012-06-09: 10 mL via INTRAVENOUS
  Filled 2012-06-09: qty 10

## 2012-06-09 NOTE — Patient Instructions (Addendum)
Dehydration, Adult Dehydration is when you lose more fluids from the body than you take in. Vital organs like the kidneys, brain, and heart cannot function without a proper amount of fluids and salt. Any loss of fluids from the body can cause dehydration.  CAUSES   Vomiting.  Diarrhea.  Excessive sweating.  Excessive urine output.  Fever. SYMPTOMS  Mild dehydration  Thirst.  Dry lips.  Slightly dry mouth. Moderate dehydration  Very dry mouth.  Sunken eyes.  Skin does not bounce back quickly when lightly pinched and released.  Dark urine and decreased urine production.  Decreased tear production.  Headache. Severe dehydration  Very dry mouth.  Extreme thirst.  Rapid, weak pulse (more than 100 beats per minute at rest).  Cold hands and feet.  Not able to sweat in spite of heat and temperature.  Rapid breathing.  Blue lips.  Confusion and lethargy.  Difficulty being awakened.  Minimal urine production.  No tears. DIAGNOSIS  Your caregiver will diagnose dehydration based on your symptoms and your exam. Blood and urine tests will help confirm the diagnosis. The diagnostic evaluation should also identify the cause of dehydration. TREATMENT  Treatment of mild or moderate dehydration can often be done at home by increasing the amount of fluids that you drink. It is best to drink small amounts of fluid more often. Drinking too much at one time can make vomiting worse. Refer to the home care instructions below. Severe dehydration needs to be treated at the hospital where you will probably be given intravenous (IV) fluids that contain water and electrolytes. HOME CARE INSTRUCTIONS   Ask your caregiver about specific rehydration instructions.  Drink enough fluids to keep your urine clear or pale yellow.  Drink small amounts frequently if you have nausea and vomiting.  Eat as you normally do.  Avoid:  Foods or drinks high in sugar.  Carbonated  drinks.  Juice.  Extremely hot or cold fluids.  Drinks with caffeine.  Fatty, greasy foods.  Alcohol.  Tobacco.  Overeating.  Gelatin desserts.  Wash your hands well to avoid spreading bacteria and viruses.  Only take over-the-counter or prescription medicines for pain, discomfort, or fever as directed by your caregiver.  Ask your caregiver if you should continue all prescribed and over-the-counter medicines.  Keep all follow-up appointments with your caregiver. SEEK MEDICAL CARE IF:  You have abdominal pain and it increases or stays in one area (localizes).  You have a rash, stiff neck, or severe headache.  You are irritable, sleepy, or difficult to awaken.  You are weak, dizzy, or extremely thirsty. SEEK IMMEDIATE MEDICAL CARE IF:   You are unable to keep fluids down or you get worse despite treatment.  You have frequent episodes of vomiting or diarrhea.  You have blood or green matter (bile) in your vomit.  You have blood in your stool or your stool looks black and tarry.  You have not urinated in 6 to 8 hours, or you have only urinated a small amount of very dark urine.  You have a fever.  You faint. MAKE SURE YOU:   Understand these instructions.  Will watch your condition.  Will get help right away if you are not doing well or get worse. Document Released: 04/18/2005 Document Revised: 07/11/2011 Document Reviewed: 12/06/2010 [employer] Patient Information 2013 [employer].  

## 2012-06-13 ENCOUNTER — Other Ambulatory Visit: Payer: BC Managed Care – PPO

## 2012-06-13 ENCOUNTER — Encounter: Payer: Self-pay | Admitting: Adult Health

## 2012-06-13 ENCOUNTER — Telehealth: Payer: Self-pay | Admitting: Oncology

## 2012-06-13 ENCOUNTER — Ambulatory Visit (HOSPITAL_BASED_OUTPATIENT_CLINIC_OR_DEPARTMENT_OTHER): Payer: BC Managed Care – PPO | Admitting: Lab

## 2012-06-13 ENCOUNTER — Other Ambulatory Visit (HOSPITAL_BASED_OUTPATIENT_CLINIC_OR_DEPARTMENT_OTHER): Payer: BC Managed Care – PPO | Admitting: Lab

## 2012-06-13 ENCOUNTER — Ambulatory Visit (HOSPITAL_BASED_OUTPATIENT_CLINIC_OR_DEPARTMENT_OTHER): Payer: BC Managed Care – PPO

## 2012-06-13 ENCOUNTER — Encounter (HOSPITAL_COMMUNITY)
Admission: RE | Admit: 2012-06-13 | Discharge: 2012-06-13 | Disposition: A | Discharge status: Home / Self Care | Payer: BC Managed Care – PPO | Source: Ambulatory Visit | Attending: Oncology | Admitting: Oncology

## 2012-06-13 ENCOUNTER — Ambulatory Visit (HOSPITAL_BASED_OUTPATIENT_CLINIC_OR_DEPARTMENT_OTHER): Payer: BC Managed Care – PPO | Admitting: Adult Health

## 2012-06-13 VITALS — BP 132/81 | HR 79 | Temp 97.8°F | Resp 18

## 2012-06-13 VITALS — BP 109/67 | HR 96 | Temp 98.0°F | Resp 20 | Ht 62.0 in | Wt 169.2 lb

## 2012-06-13 DIAGNOSIS — Z5112 Encounter for antineoplastic immunotherapy: Secondary | ICD-10-CM

## 2012-06-13 DIAGNOSIS — Z171 Estrogen receptor negative status [ER-]: Secondary | ICD-10-CM

## 2012-06-13 DIAGNOSIS — D649 Anemia, unspecified: Secondary | ICD-10-CM

## 2012-06-13 DIAGNOSIS — C50419 Malignant neoplasm of upper-outer quadrant of unspecified female breast: Secondary | ICD-10-CM

## 2012-06-13 LAB — CBC WITH DIFFERENTIAL/PLATELET
Basophils Absolute: 0 10*3/uL (ref 0.0–0.1)
Eosinophils Absolute: 0.2 10*3/uL (ref 0.0–0.5)
HCT: 24.7 % — ABNORMAL LOW (ref 34.8–46.6)
HCT: 24.2 % — ABNORMAL LOW (ref 34.8–46.6)
HGB: 8.3 g/dL — ABNORMAL LOW (ref 11.6–15.9)
LYMPH%: 22.9 % (ref 14.0–49.7)
MCV: 99.2 fL (ref 79.5–101.0)
MONO#: 0.4 10*3/uL (ref 0.1–0.9)
MONO#: 0.4 10*3/uL (ref 0.1–0.9)
NEUT#: 3.3 10*3/uL (ref 1.5–6.5)
NEUT#: 3.4 10*3/uL (ref 1.5–6.5)
NEUT%: 63.5 % (ref 38.4–76.8)
NEUT%: 64.2 % (ref 38.4–76.8)
Platelets: 149 10*3/uL (ref 145–400)
WBC: 5.2 10*3/uL (ref 3.9–10.3)
WBC: 5.3 10*3/uL (ref 3.9–10.3)
lymph#: 1.2 10*3/uL (ref 0.9–3.3)

## 2012-06-13 LAB — COMPREHENSIVE METABOLIC PANEL (CC13)
BUN: 7.8 mg/dL (ref 7.0–26.0)
CO2: 28 mEq/L (ref 22–29)
Calcium: 6.7 mg/dL — ABNORMAL LOW (ref 8.4–10.4)
Chloride: 102 mEq/L (ref 98–107)
Creatinine: 1.2 mg/dL — ABNORMAL HIGH (ref 0.6–1.1)
Glucose: 99 mg/dl (ref 70–99)

## 2012-06-13 MED ORDER — ACETAMINOPHEN 325 MG PO TABS
650.0000 mg | ORAL_TABLET | Freq: Once | ORAL | Status: AC
  Administered 2012-06-13: 650 mg via ORAL

## 2012-06-13 MED ORDER — DIPHENHYDRAMINE HCL 25 MG PO CAPS
50.0000 mg | ORAL_CAPSULE | Freq: Once | ORAL | Status: AC
  Administered 2012-06-13: 50 mg via ORAL

## 2012-06-13 MED ORDER — SODIUM CHLORIDE 0.9 % IV SOLN
Freq: Once | INTRAVENOUS | Status: AC
  Administered 2012-06-13: 13:00:00 via INTRAVENOUS

## 2012-06-13 MED ORDER — HEPARIN SOD (PORK) LOCK FLUSH 100 UNIT/ML IV SOLN
500.0000 [IU] | Freq: Once | INTRAVENOUS | Status: AC | PRN
  Administered 2012-06-13: 500 [IU]
  Filled 2012-06-13: qty 5

## 2012-06-13 MED ORDER — TRASTUZUMAB CHEMO INJECTION 440 MG
6.0000 mg/kg | Freq: Once | INTRAVENOUS | Status: AC
  Administered 2012-06-13: 504 mg via INTRAVENOUS
  Filled 2012-06-13: qty 24

## 2012-06-13 MED ORDER — SODIUM CHLORIDE 0.9 % IJ SOLN
10.0000 mL | INTRAMUSCULAR | Status: DC | PRN
  Administered 2012-06-13: 10 mL
  Filled 2012-06-13: qty 10

## 2012-06-13 NOTE — Patient Instructions (Addendum)
[facility] Discharge Instructions for Patients Receiving Chemotherapy  Today you received the following chemotherapy agent Herceptin.  To help prevent nausea and vomiting after your treatment, we encourage you to take your nausea medication. Begin taking your nausea medication as often as prescribed for by Dr. Welton Flakes.    If you develop nausea and vomiting that is not controlled by your nausea medication, call the clinic. If it is after clinic hours your family physician or the after hours number for the clinic or go to the Emergency Department.   BELOW ARE SYMPTOMS THAT SHOULD BE REPORTED IMMEDIATELY:  *FEVER GREATER THAN 100.5 F  *CHILLS WITH OR WITHOUT FEVER  NAUSEA AND VOMITING THAT IS NOT CONTROLLED WITH YOUR NAUSEA MEDICATION  *UNUSUAL SHORTNESS OF BREATH  *UNUSUAL BRUISING OR BLEEDING  TENDERNESS IN MOUTH AND THROAT WITH OR WITHOUT PRESENCE OF ULCERS  *URINARY PROBLEMS  *BOWEL PROBLEMS  UNUSUAL RASH Items with * indicate a potential emergency and should be followed up as soon as possible.  One of the nurses will contact you 24 hours after your treatment. Please let the nurse know about any problems that you may have experienced. Feel free to call the clinic you have any questions or concerns. The clinic phone number is [phone].   I have been informed and understand all the instructions given to me. I know to contact the clinic, my physician, or go to the Emergency Department if any problems should occur. I do not have any questions at this time, but understand that I may call the clinic during office hours   should I have any questions or need assistance in obtaining follow up care.    __________________________________________  _____________  __________ Signature of Patient or Authorized Representative            Date                   Time    __________________________________________ Nurse's Signature   Blood Transfusion Information WHAT  IS A BLOOD TRANSFUSION? A transfusion is the replacement of blood or some of its parts. Blood is made up of multiple cells which provide different functions.  Red blood cells carry oxygen and are used for blood loss replacement.  White blood cells fight against infection.  Platelets control bleeding.  Plasma helps clot blood.  Other blood products are available for specialized needs, such as hemophilia or other clotting disorders. BEFORE THE TRANSFUSION  Who gives blood for transfusions?   You may be able to donate blood to be used at a later date on yourself (autologous donation).  Relatives can be asked to donate blood. This is generally not any safer than if you have received blood from a stranger. The same precautions are taken to ensure safety when a relative's blood is donated.  Healthy volunteers who are fully evaluated to make sure their blood is safe. This is blood bank blood. Transfusion therapy is the safest it has ever been in the practice of medicine. Before blood is taken from a donor, a complete history is taken to make sure that person has no history of diseases nor engages in risky social behavior (examples are intravenous drug use or sexual activity with multiple partners). The donor's travel history is screened to minimize risk of transmitting infections, such as malaria. The donated blood is tested for signs of infectious diseases, such as HIV and hepatitis. The blood is then tested to be sure it is compatible with you in order  to minimize the chance of a transfusion reaction. If you or a relative donates blood, this is often done in anticipation of surgery and is not appropriate for emergency situations. It takes many days to process the donated blood. RISKS AND COMPLICATIONS Although transfusion therapy is very safe and saves many lives, the main dangers of transfusion include:   Getting an infectious disease.  Developing a transfusion reaction. This is an allergic  reaction to something in the blood you were given. Every precaution is taken to prevent this. The decision to have a blood transfusion has been considered carefully by your caregiver before blood is given. Blood is not given unless the benefits outweigh the risks. AFTER THE TRANSFUSION  Right after receiving a blood transfusion, you will usually feel much better and more energetic. This is especially true if your red blood cells have gotten low (anemic). The transfusion raises the level of the red blood cells which carry oxygen, and this usually causes an energy increase.  The nurse administering the transfusion will monitor you carefully for complications. HOME CARE INSTRUCTIONS  No special instructions are needed after a transfusion. You may find your energy is better. Speak with your caregiver about any limitations on activity for underlying diseases you may have. SEEK MEDICAL CARE IF:   Your condition is not improving after your transfusion.  You develop redness or irritation at the intravenous (IV) site. SEEK IMMEDIATE MEDICAL CARE IF:  Any of the following symptoms occur over the next 12 hours:  Shaking chills.  You have a temperature by mouth above 102 F (38.9 C), not controlled by medicine.  Chest, back, or muscle pain.  People around you feel you are not acting correctly or are confused.  Shortness of breath or difficulty breathing.  Dizziness and fainting.  You get a rash or develop hives.  You have a decrease in urine output.  Your urine turns a dark color or changes to pink, red, or brown. Any of the following symptoms occur over the next 10 days:  You have a temperature by mouth above 102 F (38.9 C), not controlled by medicine.  Shortness of breath.  Weakness after normal activity.  The white part of the eye turns yellow (jaundice).  You have a decrease in the amount of urine or are urinating less often.  Your urine turns a dark color or changes to pink,  red, or brown. Document Released: 04/15/2000 Document Revised: 07/11/2011 Document Reviewed: 12/03/2007 [facility] Patient Information 2013 [place], [place].

## 2012-06-13 NOTE — Patient Instructions (Addendum)
Doing well.  We are going to repeat your labs today and send you to receive your Herceptin and blood.  Please call us if you have any questions or concerns.

## 2012-06-13 NOTE — Telephone Encounter (Signed)
gv pt appt schedule February.

## 2012-06-13 NOTE — Progress Notes (Signed)
Maria Mckinney [id number] 12/03/1950 62 y.o. 06/13/2012 12:03 PM  CC  Maria Mckinney L, MD Dr. [name] [address] [place] [address] Dr. [name] Dr. [name] Dr. 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  PRIOR THERAPY:  #1Patient was originally seen in the multidisciplinary breast clinic for new diagnosis of left breast cancer that was 8mm in the upper outer quadrant of the left breast.  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  There was an additional 4 cm area of normal signal.  Patient did not get an MRI secondary to cochlear implant.  She desires breast conservation.    #2 Patient is s/p port-a-cath placement for neoadjuvant chemotherapy consisting of Taxotere/carboplatinum/Herceptin.  She will begin cycle #1 on 02/29/2012.  She will receive Taxotere and carboplatinum every 21 days with Herceptin giving weekly.  She will receive a total of 4 cycles of Taxotere and Carboplatinum--which completed on 05/03/12.    #3 Herceptin every 3 weeks beginning 05/23/12  CURRENT THERAPY: Herceptin q3 weeks  INTERVAL HISTORY: Ms. Maria Mckinney returns today for follow up. She continues to be fatigued and mildly nauseated.  Her husband has accompanied her and he comments that she is much improved from last week.  She eats about 1.5 meals per day, and drinks approximately 40 ounces/day.  She tried scheduling Zofran and taking the Carafate and that makes her nausea worse.  She is tired, and weak, but denies any blood in stool, black tarry stool, or any pain anywhere.  Otherwise a 10 point ROS is neg.   Past Medical History: Past Medical History  Diagnosis Date  . HTN (hypertension)   . Fibromyalgia   . GERD (gastroesophageal reflux disease)   . Arthritis   . Anxiety   . Dysrhythmia    irreg hr  . Wears hearing aid     left ear  . 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 PONV (postoperative nausea and vomiting)     Past Surgical History: Past Surgical History  Procedure Laterality Date  . Appendectomy    . Combined hysteroscopy diagnostic / d&c    . Cochlear implant    . Portacath placement  02/22/2012    Procedure: INSERTION PORT-A-CATH;  Surgeon: [name], MD;  Location: [facility];  Service: General;  Laterality: Right;    Family History: Family History  Problem Relation Age of Onset  . Cancer Mother     "tumor of face removed"  . Cancer Father     kidney removed  . Cancer Maternal Aunt     stomach & Pancreatic  . Cancer Paternal Uncle     pancreatic    Social History History  Substance Use Topics  . Smoking status: Never Smoker   . Smokeless tobacco: Never Used  . Alcohol Use: 0.0 oz/week    0-1 Glasses of wine per week    Allergies: Allergies  Allergen Reactions  . Acyclovir And Related   . Ciprofloxacin Hcl Rash  . Bactrim (Sulfamethoxazole W-Trimethoprim)     Current Medications: Current Outpatient Prescriptions  Medication Sig Dispense Refill  . bisoprolol-hydrochlorothiazide (ZIAC) 5-6.25 MG per tablet Take 1 tablet by mouth daily.      . cyclobenzaprine (FLEXERIL) 10 MG tablet Take 10 mg by mouth 3 (three) times daily as needed. For muscle pain.      Marland Kitchen  DULoxetine (CYMBALTA) 60 MG capsule Take 60 mg by mouth daily.      Marland Kitchen esomeprazole (NEXIUM) 40 MG capsule Take 1 capsule (40 mg total) by mouth daily before breakfast.  30 capsule  6  . HYDROcodone-acetaminophen (VICODIN) 5-500 MG per tablet Take 1 tablet by mouth every 6 (six) hours as needed. For pain.      Marland Kitchen levothyroxine (SYNTHROID, LEVOTHROID) 100 MCG tablet Take 100 mcg by mouth daily.      Marland Kitchen lidocaine-prilocaine (EMLA) cream Apply 1 application topically as needed. For port-a-cath access.      . meloxicam (MOBIC) 15 MG tablet Take 15 mg by mouth daily.       . mometasone (NASONEX) 50 MCG/ACT nasal spray Place 2 sprays into the nose daily.      . ondansetron (ZOFRAN) 8 MG tablet Take 1 tablet (8 mg total) by mouth 2 (two) times daily. Take BID starting the day after chemo x 3 days. Then BID PRN nausea or vomiting.  30 tablet  1  . potassium chloride SA (K-DUR,KLOR-CON) 20 MEQ tablet Take 1 tablet (20 mEq total) by mouth daily.  30 tablet  0  . prochlorperazine (COMPAZINE) 10 MG tablet       . promethazine (PHENERGAN) 25 MG tablet Take 1 tablet (25 mg total) by mouth every 6 (six) hours as needed for nausea.  60 tablet  0  . sucralfate (CARAFATE) 1 GM/10ML suspension Take 10 mLs (1 g total) by mouth 4 (four) times daily.  420 mL  0  . PRESCRIPTION MEDICATION Inject into the vein every 7 (seven) days. Herceptin infusion every Wednesday.      . pseudoephedrine (SUDAFED) 30 MG tablet Take 30 mg by mouth every 4 (four) hours as needed.       No current facility-administered medications for this visit.    Health Maintenance:  ColonoscopyShe has never had a colonoscopy Bone DensityBone density about 10-12 years ago Last PAP smearLast Pap smear was in 2012   REVIEW OF SYSTEMS: General: fatigue (+), night sweats (-), fever (-), pain (-) Lymph: palpable nodes (-) HEENT: vision changes (-), mucositis (-), gum bleeding (-), epistaxis (-) Cardiovascular: chest pain (-), palpitations (-) Pulmonary: shortness of breath (-), dyspnea on exertion (-), cough (-), hemoptysis (-) GI:  Early satiety (-), melena (-), dysphagia (-), nausea/vomiting (+), diarrhea (-) GU: dysuria (-), hematuria (-), incontinence (-) Musculoskeletal: joint swelling (-), joint pain (-), back pain (-) Neuro: weakness (-), numbness (-), headache (-), confusion (-) Skin: Rash (-), lesions (-), dryness (-) Psych: depression (-), suicidal/homicidal ideation (-), feeling of hopelessness (-)   PHYSICAL EXAMINATION: Blood pressure 109/67, pulse 96, temperature 98 F (36.7 C),  temperature source Oral, resp. rate 20, height 5\' 2"  (1.575 m), weight 169 lb 3.2 oz (76.749 kg). General: Patient is a well appearing female in no acute distress HEENT: PERRLA, sclerae anicteric no conjunctival pallor, MMM,  Neck: supple, no palpable adenopathy Lungs: clear to auscultation bilaterally, no wheezes, rhonchi, or rales Cardiovascular: regular rate rhythm, S1, S2, no murmurs, rubs or gallops,  Abdomen: Soft, non-tender, non-distended, normoactive bowel sounds, no HSM Extremities: warm and well perfused, no clubbing, cyanosis, or edema Skin: No rashes or lesions Neuro: Non-focal ECOG PERFORMANCE STATUS: 1 - Symptomatic but completely ambulatory Breasts: unable to palpate left breast mass, no nodularity, right breast: no masses or nodularity  STUDIES/RESULTS: No results found.   LABS:    Chemistry      Component Value Date/Time   NA 142  06/08/2012 1100   NA 133* 05/21/2012 0644   K 3.0 Repeated and Verified* 06/08/2012 1100   K 3.9 05/21/2012 0644   CL 103 06/08/2012 1100   CL 97 05/21/2012 0644   CO2 25 06/08/2012 1100   CO2 25 05/21/2012 0644   BUN 6.1* 06/08/2012 1100   BUN 6 05/21/2012 0644   CREATININE 1.4* 06/08/2012 1100   CREATININE 1.01 05/21/2012 0644      Component Value Date/Time   CALCIUM 6.9* 06/08/2012 1100   CALCIUM 8.2* 05/21/2012 0644   ALKPHOS 130 06/04/2012 1038   ALKPHOS 59 05/10/2012 1909   AST 27 06/04/2012 1038   AST 30 05/10/2012 1909   ALT 17 06/04/2012 1038   ALT 69* 05/10/2012 1909   BILITOT 1.03 06/04/2012 1038   BILITOT 3.9* 05/10/2012 1909      Lab Results  Component Value Date   WBC 5.2 06/13/2012   HGB 7.7* 06/13/2012   HCT 24.7* 06/13/2012   MCV 99.2 06/13/2012   PLT 149 06/13/2012      PATHOLOGY:  ADDITIONAL INFORMATION: PROGNOSTIC INDICATORS - ACIS Results IMMUNOHISTOCHEMICAL AND MORPHOMETRIC ANALYSIS BY THE AUTOMATED CELLULAR IMAGING SYSTEM (ACIS) Estrogen Receptor (Negative, <1%): 0%, NEGATIVE Progesterone Receptor (Negative, <1%): 0%,  NEGATIVE Proliferation Marker Ki67 by M IB-1 (Low<20%): 98% All controls stained appropriately Pecola Leisure MD Pathologist, Electronic Signature ( Signed 02/15/2012) CHROMOGENIC IN-SITU HYBRIDIZATION Interpretation: HER2/NEU BY CISH - SHOWS AMPLIFICATION BY CISH ANALYSIS. THE RATIO OF HER2: CEP 17 SIGNALS WAS 2.55. 40 TUMOR CELLS WERE SCORED. OF NOTE, THE TUMOR APPEARS TO SHOW HETEROGENEITY IN REGARDS TO HER2 EXPRESSION. Reference range: Ratio: HER2:CEP17 < 1.8 gene amplification not observed Ratio: HER2:CEP 17 1.8-2.2 - equivocal result Ratio: HER2:CEP17 > 2.2 - gene amplification observed Pecola Leisure MD Pathologist, Electronic Signature ( Signed 02/15/2012) 1 of ADDITIONAL INFORMATION: PROGNOSTIC INDICATORS - ACIS Results IMMUNOHISTOCHEMICAL AND MORPHOMETRIC ANALYSIS BY THE AUTOMATED CELLULAR IMAGING SYSTEM (ACIS) Estrogen Receptor (Negative, <1%): 0%, NEGATIVE Progesterone Receptor (Negative, <1%): 0%, NEGATIVE COMMENT: The negative hormone receptor study(ies) in this case have an internal positive control. All controls stained appropriately Abigail Miyamoto MD Pathologist, Electronic Signature ( Signed 02/22/2012)  ASSESSMENT #1 Stage 1 HER-2 positive left breast cancer ER negative PR negative.  Patient is being considered for neoadjuvant chemotherapy consisting of Taxotere carboplatinum and herceptin as she does want breast conservation.  She understands the risks and benefits of this approach and the side effects of her chemotherapy and Herceptin.  #2 She understands she will receive neulasta the day after chemotherapy to keep her white count up.    #3 She does have thrombocytopenia and we will monitor this  #4 Dehydration/AKI  #5 Anemia  PLAN:  #1 Ms. Theissen is doing well today.  She will proceed with Herceptin.   #2 We will f/u on her creatinine today.  Her creatinine last week was slightly improved.  I encouraged her to drink at least 60-80 ounces of fluids per  day.    #3 She is anemic, which is still likely a sequela of chemotherapy.  She has no obvious signs of blood loss.  I will order stool cards to be completed to ensure she has no GI loses of blood.  She will receive PRBCs, one unit today, one unit Friday.   #4 We will see her for her next week for follow up.   All questions were answered. The patient knows to call the clinic with any problems, questions or concerns. We can certainly see the patient much  sooner if necessary.  I spent 25 minutes counseling the patient face to face. The total time spent in the appointment was 30 minutes.  Cherie Ouch Lyn Hollingshead, NP Medical Oncology [facility] Phone: [phone] 06/13/2012, 12:03 PM

## 2012-06-15 ENCOUNTER — Ambulatory Visit: Payer: BC Managed Care – PPO | Admitting: Lab

## 2012-06-15 ENCOUNTER — Other Ambulatory Visit: Payer: Self-pay | Admitting: Certified Registered Nurse Anesthetist

## 2012-06-15 ENCOUNTER — Ambulatory Visit: Payer: BC Managed Care – PPO

## 2012-06-15 ENCOUNTER — Other Ambulatory Visit: Payer: Self-pay | Admitting: Adult Health

## 2012-06-15 DIAGNOSIS — D649 Anemia, unspecified: Secondary | ICD-10-CM

## 2012-06-15 LAB — PREPARE RBC (CROSSMATCH)

## 2012-06-15 MED ORDER — DIPHENHYDRAMINE HCL 25 MG PO CAPS
25.0000 mg | ORAL_CAPSULE | Freq: Once | ORAL | Status: AC
  Administered 2012-06-15: 25 mg via ORAL

## 2012-06-15 MED ORDER — ACETAMINOPHEN 325 MG PO TABS
650.0000 mg | ORAL_TABLET | Freq: Once | ORAL | Status: AC
  Administered 2012-06-15: 650 mg via ORAL

## 2012-06-15 NOTE — Progress Notes (Signed)
Pt removed blood band prior to coming in. Retyped and crossed pt. Blood bank advised they will need more time to prepare blood and cannot give time on when it will be ready. Pt does not want to wait as she has been here since 1pm. Moved Blood transfusion to 06/16/12 0800. Pt verbalized understanding. Verbalized importance of leaving blue blood band on.

## 2012-06-15 NOTE — Patient Instructions (Addendum)
Pt to leave Blood band on for transfusion scheduled for 06/16/12.

## 2012-06-16 ENCOUNTER — Ambulatory Visit (HOSPITAL_BASED_OUTPATIENT_CLINIC_OR_DEPARTMENT_OTHER): Payer: BC Managed Care – PPO

## 2012-06-16 VITALS — BP 122/59 | HR 76 | Temp 97.9°F | Resp 18

## 2012-06-16 DIAGNOSIS — D649 Anemia, unspecified: Secondary | ICD-10-CM

## 2012-06-16 DIAGNOSIS — C50919 Malignant neoplasm of unspecified site of unspecified female breast: Secondary | ICD-10-CM

## 2012-06-16 LAB — TYPE AND SCREEN: Unit division: 0

## 2012-06-16 MED ORDER — DIPHENHYDRAMINE HCL 25 MG PO CAPS
25.0000 mg | ORAL_CAPSULE | Freq: Once | ORAL | Status: AC
  Administered 2012-06-16: 25 mg via ORAL

## 2012-06-16 MED ORDER — HEPARIN SOD (PORK) LOCK FLUSH 100 UNIT/ML IV SOLN
500.0000 [IU] | Freq: Once | INTRAVENOUS | Status: AC
  Administered 2012-06-16: 500 [IU] via INTRAVENOUS
  Filled 2012-06-16: qty 5

## 2012-06-16 MED ORDER — SODIUM CHLORIDE 0.9 % IJ SOLN
10.0000 mL | INTRAMUSCULAR | Status: DC | PRN
  Administered 2012-06-16: 10 mL via INTRAVENOUS
  Filled 2012-06-16: qty 10

## 2012-06-16 MED ORDER — ACETAMINOPHEN 325 MG PO TABS
650.0000 mg | ORAL_TABLET | Freq: Once | ORAL | Status: AC
  Administered 2012-06-16: 650 mg via ORAL

## 2012-06-16 NOTE — Patient Instructions (Addendum)
Blood Transfusion Information WHAT IS A BLOOD TRANSFUSION? A transfusion is the replacement of blood or some of its parts. Blood is made up of multiple cells which provide different functions.  Red blood cells carry oxygen and are used for blood loss replacement.  White blood cells fight against infection.  Platelets control bleeding.  Plasma helps clot blood.  Other blood products are available for specialized needs, such as hemophilia or other clotting disorders. BEFORE THE TRANSFUSION  Who gives blood for transfusions?   You may be able to donate blood to be used at a later date on yourself (autologous donation).  Relatives can be asked to donate blood. This is generally not any safer than if you have received blood from a stranger. The same precautions are taken to ensure safety when a relative's blood is donated.  Healthy volunteers who are fully evaluated to make sure their blood is safe. This is blood bank blood. Transfusion therapy is the safest it has ever been in the practice of medicine. Before blood is taken from a donor, a complete history is taken to make sure that person has no history of diseases nor engages in risky social behavior (examples are intravenous drug use or sexual activity with multiple partners). The donor's travel history is screened to minimize risk of transmitting infections, such as malaria. The donated blood is tested for signs of infectious diseases, such as HIV and hepatitis. The blood is then tested to be sure it is compatible with you in order to minimize the chance of a transfusion reaction. If you or a relative donates blood, this is often done in anticipation of surgery and is not appropriate for emergency situations. It takes many days to process the donated blood. RISKS AND COMPLICATIONS Although transfusion therapy is very safe and saves many lives, the main dangers of transfusion include:   Getting an infectious disease.  Developing a  transfusion reaction. This is an allergic reaction to something in the blood you were given. Every precaution is taken to prevent this. The decision to have a blood transfusion has been considered carefully by your caregiver before blood is given. Blood is not given unless the benefits outweigh the risks. AFTER THE TRANSFUSION  Right after receiving a blood transfusion, you will usually feel much better and more energetic. This is especially true if your red blood cells have gotten low (anemic). The transfusion raises the level of the red blood cells which carry oxygen, and this usually causes an energy increase.  The nurse administering the transfusion will monitor you carefully for complications. HOME CARE INSTRUCTIONS  No special instructions are needed after a transfusion. You may find your energy is better. Speak with your caregiver about any limitations on activity for underlying diseases you may have. SEEK MEDICAL CARE IF:   Your condition is not improving after your transfusion.  You develop redness or irritation at the intravenous (IV) site. SEEK IMMEDIATE MEDICAL CARE IF:  Any of the following symptoms occur over the next 12 hours:  Shaking chills.  You have a temperature by mouth above 102 F (38.9 C), not controlled by medicine.  Chest, back, or muscle pain.  People around you feel you are not acting correctly or are confused.  Shortness of breath or difficulty breathing.  Dizziness and fainting.  You get a rash or develop hives.  You have a decrease in urine output.  Your urine turns a dark color or changes to pink, red, or brown. Any of the following   symptoms occur over the next 10 days:  You have a temperature by mouth above 102 F (38.9 C), not controlled by medicine.  Shortness of breath.  Weakness after normal activity.  The white part of the eye turns yellow (jaundice).  You have a decrease in the amount of urine or are urinating less often.  Your  urine turns a dark color or changes to pink, red, or brown. Document Released: 04/15/2000 Document Revised: 07/11/2011 Document Reviewed: 12/03/2007 [employer] Patient Information 2013 [employer].  

## 2012-06-17 LAB — TYPE AND SCREEN
Antibody Screen: POSITIVE
DAT, IgG: NEGATIVE
Unit division: 0

## 2012-06-18 ENCOUNTER — Encounter (HOSPITAL_COMMUNITY): Payer: Self-pay | Admitting: Pharmacy Technician

## 2012-06-20 ENCOUNTER — Encounter: Payer: Self-pay | Admitting: Adult Health

## 2012-06-20 ENCOUNTER — Ambulatory Visit: Payer: BC Managed Care – PPO | Admitting: Lab

## 2012-06-20 ENCOUNTER — Telehealth: Payer: Self-pay | Admitting: Oncology

## 2012-06-20 ENCOUNTER — Ambulatory Visit (HOSPITAL_BASED_OUTPATIENT_CLINIC_OR_DEPARTMENT_OTHER): Payer: BC Managed Care – PPO | Admitting: Adult Health

## 2012-06-20 VITALS — BP 128/78 | HR 93 | Temp 98.2°F | Resp 20 | Ht 62.0 in | Wt 164.1 lb

## 2012-06-20 LAB — CBC WITH DIFFERENTIAL/PLATELET
Basophils Absolute: 0 10*3/uL (ref 0.0–0.1)
Eosinophils Absolute: 0.4 10*3/uL (ref 0.0–0.5)
HGB: 11.8 g/dL (ref 11.6–15.9)
MCV: 97.5 fL (ref 79.5–101.0)
MONO#: 0.4 10*3/uL (ref 0.1–0.9)
MONO%: 9 % (ref 0.0–14.0)
NEUT#: 2.6 10*3/uL (ref 1.5–6.5)
RDW: 19.1 % — ABNORMAL HIGH (ref 11.2–14.5)
WBC: 4.9 10*3/uL (ref 3.9–10.3)

## 2012-06-20 LAB — BASIC METABOLIC PANEL (CC13)
CO2: 26 mEq/L (ref 22–29)
Calcium: 7.2 mg/dL — ABNORMAL LOW (ref 8.4–10.4)
Chloride: 102 mEq/L (ref 98–107)
Glucose: 108 mg/dl — ABNORMAL HIGH (ref 70–99)
Potassium: 3.9 mEq/L (ref 3.5–5.1)
Sodium: 140 mEq/L (ref 136–145)

## 2012-06-20 NOTE — Patient Instructions (Addendum)
Doing well.  We will add on labs today and I will tell you if I find anything concerning.    Hypokalemia Hypokalemia means a low potassium level in the blood.Potassium is an electrolyte that helps regulate the amount of fluid in the body. It also stimulates muscle contraction and maintains a stable acid-base balance.Most of the body's potassium is inside of cells, and only a very small amount is in the blood. Because the amount in the blood is so small, minor changes can have big effects. PREPARATION FOR TEST Testing for potassium requires taking a blood sample taken by needle from a vein in the arm. The skin is cleaned thoroughly before the sample is drawn. There is no other special preparation needed. NORMAL VALUES Potassium levels below 3.5 mEq/L are abnormally low. Levels above 5.1 mEq/L are abnormally high. Ranges for normal findings may vary among different laboratories and hospitals. You should always check with your doctor after having lab work or other tests done to discuss the meaning of your test results and whether your values are considered within normal limits. MEANING OF TEST  Your caregiver will go over the test results with you and discuss the importance and meaning of your results, as well as treatment options and the need for additional tests, if necessary. A potassium level is frequently part of a routine medical exam. It is usually included as part of a whole "panel" of tests for several blood salts (such as Sodium and Chloride). It may be done as part of follow-up when a low potassium level was found in the past or other blood salts are suspected of being out of balance. A low potassium level might be suspected if you have one or more of the following:  Symptoms of weakness.  Abnormal heart rhythms.  High blood pressure and are taking medication to control this, especially water pills (diuretics).  Kidney disease that can affect your potassium level .  Diabetes requiring  the use of insulin. The potassium may fall after taking insulin, especially if the diabetes had been out of control for a while.  A condition requiring the use of cortisone-type medication or certain types of antibiotics.  Vomiting and/or diarrhea for more than a day or two.  A stomach or intestinal condition that may not permit appropriate absorption of potassium.  Fainting episodes.  Mental confusion. OBTAINING TEST RESULTS It is your responsibility to obtain your test results. Ask the lab or department performing the test when and how you will get your results.  Please contact your caregiver directly if you have not received the results within one week. At that time, ask if there is anything different or new you should be doing in relation to the results. TREATMENT Hypokalemia can be treated with potassium supplements taken by mouth and/or adjustments in your current medications. A diet high in potassium is also helpful. Foods with high potassium content are:  Peas, lentils, lima beans, nuts, and dried fruit.  Whole grain and bran cereals and breads.  Fresh fruit, vegetables (bananas, cantaloupe, grapefruit, oranges, tomatoes, honeydew melons, potatoes).  Orange and tomato juices.  Meats. If potassium supplement has been prescribed for you today or your medications have been adjusted, see your personal caregiver in time02 for a re-check. SEEK MEDICAL CARE IF:  There is a feeling of worsening weakness.  You experience repeated chest palpitations.  You are diabetic and having difficulty keeping your blood sugars in the normal range.  You are experiencing vomiting and/or diarrhea.  You are having difficulty with any of your regular medications. SEEK IMMEDIATE MEDICAL CARE IF:  You experience chest pain, shortness of breath, or episodes of dizziness.  You have been having vomiting or diarrhea for more than 2 days.  You have a fainting episode. MAKE SURE YOU:    Understand these instructions.  Will watch your condition.  Will get help right away if you are not doing well or get worse. Document Released: 04/18/2005 Document Revised: 07/11/2011 Document Reviewed: 03/29/2008 [facility] Patient Information 2013 [place], [place].

## 2012-06-20 NOTE — Telephone Encounter (Signed)
Changed time of 3/5 appts to 12:45pm. S/w pt husband he is aware and pt to get new schedule when she comes in today.

## 2012-06-20 NOTE — Telephone Encounter (Signed)
Per 2/19 pof f/u as scheduled. Pt sent to lb and given schedule for March.

## 2012-06-20 NOTE — Progress Notes (Signed)
Maria Mckinney [id number] [birth year] 62 y.o. 06/20/2012 11:33 PM  CC  [name], MD Dr. [name] [address] [place] [address] Dr. [name] Dr. [name] Dr. 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  PRIOR THERAPY:  #1Patient was originally seen in the multidisciplinary breast clinic for new diagnosis of left breast cancer that was 8mm in the upper outer quadrant of the left breast.  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  There was an additional 4 cm area of normal signal.  Patient did not get an MRI secondary to cochlear implant.  She desires breast conservation.    #2 Patient is s/p port-a-cath placement for neoadjuvant chemotherapy consisting of Taxotere/carboplatinum/Herceptin.  She will begin cycle #1 on 02/29/2012.  She will receive Taxotere and carboplatinum every 21 days with Herceptin giving weekly.  She will receive a total of 4 cycles of Taxotere and Carboplatinum--which completed on 05/03/12.    #3 Herceptin every 3 weeks beginning 05/23/12  CURRENT THERAPY: Herceptin q3 weeks  INTERVAL HISTORY: Maria Mckinney returns today for follow up. She is much improved.  She is eating well and drinking fluids again.  Her nausea is much better.  She says she feels like a new woman since receiving her blood transfusion.  She is looking in to transferring her care to [place] to be closer to her father since her mother passed away two weeks ago.  Otherwise she's doing well and a 10 point ROS is neg.   Past Medical History: Past Medical History  Diagnosis Date  . HTN (hypertension)   . Fibromyalgia   . GERD (gastroesophageal reflux disease)   . Arthritis   . Anxiety   . Dysrhythmia     irreg hr  . Wears hearing aid     left ear  . 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 PONV (postoperative nausea and vomiting)     Past Surgical History: Past Surgical History  Procedure Laterality Date  . Appendectomy    . Combined hysteroscopy diagnostic / d&c    . Cochlear implant    . Portacath placement  02/22/2012    Procedure: INSERTION PORT-A-CATH;  Surgeon: [name], MD;  Location: [facility];  Service: General;  Laterality: Right;    Family History: Family History  Problem Relation Age of Onset  . Cancer Mother     "tumor of face removed"  . Cancer Father     kidney removed  . Cancer Maternal Aunt     stomach & Pancreatic  . Cancer Paternal Uncle     pancreatic    Social History History  Substance Use Topics  . Smoking status: Never Smoker   . Smokeless tobacco: Never Used  . Alcohol Use: 0.0 oz/week    0-1 Glasses of wine per week    Allergies: Allergies  Allergen Reactions  . Acyclovir And Related   . Ciprofloxacin Hcl Rash  . Bactrim (Sulfamethoxazole W-Trimethoprim)     Current Medications: Current Outpatient Prescriptions  Medication Sig Dispense Refill  . bisoprolol-hydrochlorothiazide (ZIAC) 5-6.25 MG per tablet Take 1 tablet by mouth daily.      . cyclobenzaprine (FLEXERIL) 10 MG tablet Take 10 mg by mouth daily as needed for muscle spasms. For muscle pain.      . DULoxetine (CYMBALTA) 60 MG  capsule Take 60 mg by mouth daily.      Marland Kitchen esomeprazole (NEXIUM) 40 MG capsule Take 1 capsule (40 mg total) by mouth daily before breakfast.  30 capsule  6  . HYDROcodone-acetaminophen (NORCO/VICODIN) 5-325 MG per tablet Take 1 tablet by mouth every 6 (six) hours as needed for pain.      Marland Kitchen levothyroxine (SYNTHROID, LEVOTHROID) 100 MCG tablet Take 100 mcg by mouth daily.      Marland Kitchen lidocaine-prilocaine (EMLA) cream Apply 1 application topically as needed. For port-a-cath access.      . meloxicam (MOBIC) 15 MG tablet Take 15 mg by mouth daily.      . mometasone (NASONEX) 50 MCG/ACT nasal spray Place 2 sprays into  the nose daily.      . ondansetron (ZOFRAN) 8 MG tablet Take 8 mg by mouth every 12 (twelve) hours as needed. Take BID starting the day after chemo x 3 days. Then BID PRN nausea or vomiting.      . potassium chloride SA (K-DUR,KLOR-CON) 20 MEQ tablet Take 20 mEq by mouth 2 (two) times daily.      . prochlorperazine (COMPAZINE) 10 MG tablet Take 10 mg by mouth 2 (two) times daily as needed (for nausea).       . pseudoephedrine (SUDAFED) 30 MG tablet Take 30 mg by mouth every 6 (six) hours as needed for congestion.       Marland Kitchen PRESCRIPTION MEDICATION Inject into the vein. Herceptin infusion every 21 days       No current facility-administered medications for this visit.    Health Maintenance:  ColonoscopyShe has never had a colonoscopy Bone DensityBone density about 10-12 years ago Last PAP smearLast Pap smear was in 2012   REVIEW OF SYSTEMS: General: fatigue (-), night sweats (-), fever (-), pain (-) Lymph: palpable nodes (-) HEENT: vision changes (-), mucositis (-), gum bleeding (-), epistaxis (-) Cardiovascular: chest pain (-), palpitations (-) Pulmonary: shortness of breath (-), dyspnea on exertion (-), cough (-), hemoptysis (-) GI:  Early satiety (-), melena (-), dysphagia (-), nausea/vomiting (-), diarrhea (-) GU: dysuria (-), hematuria (-), incontinence (-) Musculoskeletal: joint swelling (-), joint pain (-), back pain (-) Neuro: weakness (-), numbness (-), headache (-), confusion (-) Skin: Rash (-), lesions (-), dryness (-) Psych: depression (-), suicidal/homicidal ideation (-), feeling of hopelessness (-)   PHYSICAL EXAMINATION: Blood pressure 128/78, pulse 93, temperature 98.2 F (36.8 C), temperature source Oral, resp. rate 20, height 5\' 2"  (1.575 m), weight 164 lb 1.6 oz (74.435 kg). General: Patient is a well appearing female in no acute distress HEENT: PERRLA, sclerae anicteric no conjunctival pallor, MMM,  Neck: supple, no palpable adenopathy Lungs: clear to auscultation  bilaterally, no wheezes, rhonchi, or rales Cardiovascular: regular rate rhythm, S1, S2, no murmurs, rubs or gallops,  Abdomen: Soft, non-tender, non-distended, normoactive bowel sounds, no HSM Extremities: warm and well perfused, no clubbing, cyanosis, or edema Skin: No rashes or lesions Neuro: Non-focal ECOG PERFORMANCE STATUS: 1 - Symptomatic but completely ambulatory Breasts: unable to palpate left breast mass, no nodularity, right breast: no masses or nodularity  STUDIES/RESULTS: No results found.   LABS:    Chemistry      Component Value Date/Time   NA 140 06/20/2012 1609   NA 133* 05/21/2012 0644   K 3.9 06/20/2012 1609   K 3.9 05/21/2012 0644   CL 102 06/20/2012 1609   CL 97 05/21/2012 0644   CO2 26 06/20/2012 1609   CO2 25 05/21/2012 1610  BUN 12.6 06/20/2012 1609   BUN 6 05/21/2012 0644   CREATININE 1.3* 06/20/2012 1609   CREATININE 1.01 05/21/2012 0644      Component Value Date/Time   CALCIUM 7.2* 06/20/2012 1609   CALCIUM 8.2* 05/21/2012 0644   ALKPHOS 174* 06/13/2012 1046   ALKPHOS 59 05/10/2012 1909   AST 39* 06/13/2012 1046   AST 30 05/10/2012 1909   ALT 22 06/13/2012 1046   ALT 69* 05/10/2012 1909   BILITOT 1.08 06/13/2012 1046   BILITOT 3.9* 05/10/2012 1909      Lab Results  Component Value Date   WBC 4.9 06/20/2012   HGB 11.8 06/20/2012   HCT 35.3 06/20/2012   MCV 97.5 06/20/2012   PLT 121* 06/20/2012      PATHOLOGY:  ADDITIONAL INFORMATION: PROGNOSTIC INDICATORS - ACIS Results IMMUNOHISTOCHEMICAL AND MORPHOMETRIC ANALYSIS BY THE AUTOMATED CELLULAR IMAGING SYSTEM (ACIS) Estrogen Receptor (Negative, <1%): 0%, NEGATIVE Progesterone Receptor (Negative, <1%): 0%, NEGATIVE Proliferation Marker Ki67 by M IB-1 (Low<20%): 98% All controls stained appropriately Pecola Leisure MD Pathologist, Electronic Signature ( Signed 02/15/2012) CHROMOGENIC IN-SITU HYBRIDIZATION Interpretation: HER2/NEU BY CISH - SHOWS AMPLIFICATION BY CISH ANALYSIS. THE RATIO OF HER2: CEP  17 SIGNALS WAS 2.55. 40 TUMOR CELLS WERE SCORED. OF NOTE, THE TUMOR APPEARS TO SHOW HETEROGENEITY IN REGARDS TO HER2 EXPRESSION. Reference range: Ratio: HER2:CEP17 < 1.8 gene amplification not observed Ratio: HER2:CEP 17 1.8-2.2 - equivocal result Ratio: HER2:CEP17 > 2.2 - gene amplification observed Pecola Leisure MD Pathologist, Electronic Signature ( Signed 02/15/2012) 1 of ADDITIONAL INFORMATION: PROGNOSTIC INDICATORS - ACIS Results IMMUNOHISTOCHEMICAL AND MORPHOMETRIC ANALYSIS BY THE AUTOMATED CELLULAR IMAGING SYSTEM (ACIS) Estrogen Receptor (Negative, <1%): 0%, NEGATIVE Progesterone Receptor (Negative, <1%): 0%, NEGATIVE COMMENT: The negative hormone receptor study(ies) in this case have an internal positive control. All controls stained appropriately Abigail Miyamoto MD Pathologist, Electronic Signature ( Signed 02/22/2012)  ASSESSMENT #1 Stage 1 HER-2 positive left breast cancer ER negative PR negative.  Patient is being considered for neoadjuvant chemotherapy consisting of Taxotere carboplatinum and herceptin as she does want breast conservation.  She understands the risks and benefits of this approach and the side effects of her chemotherapy and Herceptin.  #2 She understands she will receive neulasta the day after chemotherapy to keep her white count up.    #3 She does have thrombocytopenia and we will monitor this  #4 Dehydration/AKI   PLAN:  #1 Maria Mckinney is doing well today.  She is eating and drinking, and feeling better since her blood transfusion.  She will proceed with surgery on 2/26.    #2 We will f/u on her creatinine today.  Her creatinine last week was slightly improved.  I encouraged her to drink at least 60-80 ounces of fluids per day.    #3 I will see her back on 07/04/12 for her next Herceptin.  All questions were answered. The patient knows to call the clinic with any problems, questions or concerns. We can certainly see the patient much sooner if  necessary.  I spent 25 minutes counseling the patient face to face. The total time spent in the appointment was 30 minutes.  Cherie Ouch Lyn Hollingshead, NP Medical Oncology [facility] Phone: [phone] 06/20/2012, 11:33 PM

## 2012-06-21 ENCOUNTER — Encounter (HOSPITAL_COMMUNITY)
Admission: RE | Admit: 2012-06-21 | Discharge: 2012-06-21 | Disposition: A | Discharge status: Home / Self Care | Payer: BC Managed Care – PPO | Source: Ambulatory Visit | Attending: General Surgery | Admitting: General Surgery

## 2012-06-21 ENCOUNTER — Encounter (HOSPITAL_COMMUNITY): Payer: Self-pay

## 2012-06-21 LAB — CBC WITH DIFFERENTIAL/PLATELET
Basophils Relative: 1 % (ref 0–1)
Eosinophils Absolute: 0.4 10*3/uL (ref 0.0–0.7)
HCT: 36.1 % (ref 36.0–46.0)
Hemoglobin: 12.3 g/dL (ref 12.0–15.0)
Lymphs Abs: 1.9 10*3/uL (ref 0.7–4.0)
MCH: 33.4 pg (ref 26.0–34.0)
MCHC: 34.1 g/dL (ref 30.0–36.0)
MCV: 98.1 fL (ref 78.0–100.0)
Monocytes Absolute: 0.6 10*3/uL (ref 0.1–1.0)
Monocytes Relative: 11 % (ref 3–12)
RBC: 3.68 MIL/uL — ABNORMAL LOW (ref 3.87–5.11)

## 2012-06-21 LAB — BASIC METABOLIC PANEL
BUN: 14 mg/dL (ref 6–23)
CO2: 27 mEq/L (ref 19–32)
Chloride: 100 mEq/L (ref 96–112)
Glucose, Bld: 96 mg/dL (ref 70–99)
Potassium: 4.1 mEq/L (ref 3.5–5.1)
Sodium: 139 mEq/L (ref 135–145)

## 2012-06-21 NOTE — Pre-Procedure Instructions (Signed)
Maria Mckinney  06/21/2012   Your procedure is scheduled on:Wednesday June 27, 2012  Report to [facility] at 0920 AM.  Call this number if you have problems the morning of surgery: [phone]   Remember:   Do not eat food or drink liquids after midnight.Tuesday   Take these medicines the morning of surgery with A SIP OF WATER: Cymbalta, Nexium(Esomprazole), Hydrocodone-Acetaminophen if needed, Levothyroxine( Synthroid) and nausea med if needed.   Do not wear jewelry, make-up or nail polish.  Do not wear lotions, powders, or perfumes. You may wear deodorant.  Do not shave 48 hours prior to surgery.   Do not bring valuables to the hospital.  Contacts, dentures or bridgework may not be worn into surgery.  Leave suitcase in the car. After surgery it may be brought to your room.  For patients admitted to the hospital, checkout time is 11:00 AM the day of  discharge.   Patients discharged the day of surgery will not be allowed to drive  home.    Special Instructions: Shower using CHG 2 nights before surgery and the night before surgery.  If you shower the day of surgery use CHG.  Use special wash - you have one bottle of CHG for all showers.  You should use approximately 1/3 of the bottle for each shower.   Please read over the following fact sheets that you were given: Pain Booklet, Coughing and Deep Breathing, MRSA Information and Surgical Site Infection Prevention

## 2012-06-25 NOTE — Progress Notes (Signed)
Anesthesia chart review: Patient is a 62 year old female scheduled for left breast wire-guided lumpectomy, left axillary sentinel node biopsy on 06/27/2012 by Dr. Dwain Sarna.  She is s/p Port-a-cath insertion on 02/22/12 following a diagnosis of left breast cancer.  Other history includes HTN, fibromyalgia, GERD, anxiety, right cochlear implant with left hearing aid, post-operative N/V, irregular heartbeat without mention of afib.  Notes indicate that in January 2014, she was admitted for febrile neutropenia, E. Coli UTI, significant nausea with acute renal injury.  As of 06/08/12, she was still quite weak and requiring daily IV fluids; however oncology follow-up on 06/20/12 indicates she was "much improved" following a blood transfusion.  She was also scheduled to receive PRBCs on 06/22/12.  PCP is Dr. [name].  Labs from 06/20/12 and 06/21/12 noted. Last PLT count 126K, H/H 12.3/36.1. WBC 5.0.  Cr 1.24.  1V CXR on 05/10/12 showed no active disease. 2V CXR on 04/27/2012 showed no acute cardiopulmonary disease.  EKG from 02/21/12 showed NSR.  Echo on 05/30/12 showed: Left ventricle: The cavity size was normal. Wall thickness was increased in a pattern of mild LVH. Systolic function was normal. The estimated ejection fraction was in the range of 55% to 60%. Wall motion was normal; there were no regional wall motion abnormalities. Doppler parameters are consistent with abnormal left ventricular relaxation (grade 1 diastolic dysfunction).   If not acute changes anticipate she can proceed as planned.  Shonna Chock, PA-C 06/25/12 1109

## 2012-06-26 MED ORDER — CEFAZOLIN SODIUM-DEXTROSE 2-3 GM-% IV SOLR
2.0000 g | INTRAVENOUS | Status: AC
  Administered 2012-06-27: 2 g via INTRAVENOUS
  Filled 2012-06-26: qty 50

## 2012-06-26 NOTE — Progress Notes (Signed)
Pt notified of time change;pt has an appointment at [facility] at [place] and then will come straight here

## 2012-06-27 ENCOUNTER — Encounter (HOSPITAL_COMMUNITY)
Admission: RE | Admit: 2012-06-27 | Discharge: 2012-06-27 | Disposition: A | Discharge status: Home / Self Care | Payer: BC Managed Care – PPO | Source: Ambulatory Visit | Attending: General Surgery | Admitting: General Surgery

## 2012-06-27 ENCOUNTER — Encounter (HOSPITAL_COMMUNITY): Payer: Self-pay | Admitting: Vascular Surgery

## 2012-06-27 ENCOUNTER — Ambulatory Visit (HOSPITAL_COMMUNITY): Payer: BC Managed Care – PPO | Admitting: Certified Registered"

## 2012-06-27 ENCOUNTER — Encounter (HOSPITAL_COMMUNITY): Payer: Self-pay | Admitting: *Deleted

## 2012-06-27 ENCOUNTER — Encounter (HOSPITAL_COMMUNITY)
Admission: RE | Disposition: A | Discharge status: Home / Self Care | Payer: Self-pay | Source: Ambulatory Visit | Attending: General Surgery

## 2012-06-27 ENCOUNTER — Ambulatory Visit (HOSPITAL_COMMUNITY)
Admission: RE | Admit: 2012-06-27 | Discharge: 2012-06-28 | Disposition: A | Discharge status: Home / Self Care | Payer: BC Managed Care – PPO | Source: Ambulatory Visit | Attending: General Surgery | Admitting: General Surgery

## 2012-06-27 DIAGNOSIS — I10 Essential (primary) hypertension: Secondary | ICD-10-CM | POA: Insufficient documentation

## 2012-06-27 DIAGNOSIS — C50919 Malignant neoplasm of unspecified site of unspecified female breast: Secondary | ICD-10-CM

## 2012-06-27 DIAGNOSIS — C50419 Malignant neoplasm of upper-outer quadrant of unspecified female breast: Secondary | ICD-10-CM | POA: Insufficient documentation

## 2012-06-27 DIAGNOSIS — Z01812 Encounter for preprocedural laboratory examination: Secondary | ICD-10-CM | POA: Insufficient documentation

## 2012-06-27 DIAGNOSIS — IMO0001 Reserved for inherently not codable concepts without codable children: Secondary | ICD-10-CM | POA: Insufficient documentation

## 2012-06-27 HISTORY — PX: BREAST LUMPECTOMY WITH NEEDLE LOCALIZATION AND AXILLARY SENTINEL LYMPH NODE BX: SHX5760

## 2012-06-27 SURGERY — BREAST LUMPECTOMY WITH NEEDLE LOCALIZATION AND AXILLARY SENTINEL LYMPH NODE BX
Anesthesia: General | Site: Breast | Laterality: Left | Wound class: Clean

## 2012-06-27 MED ORDER — OXYCODONE HCL 5 MG/5ML PO SOLN: 5.0000 mg | Freq: Once | ORAL | Status: DC | PRN

## 2012-06-27 MED ORDER — LACTATED RINGERS IV SOLN
INTRAVENOUS | Status: DC
  Administered 2012-06-27: 15:00:00 via INTRAVENOUS

## 2012-06-27 MED ORDER — 0.9 % SODIUM CHLORIDE (POUR BTL) OPTIME
TOPICAL | Status: DC | PRN
  Administered 2012-06-27: 1000 mL

## 2012-06-27 MED ORDER — MIDAZOLAM HCL 2 MG/2ML IJ SOLN
INTRAMUSCULAR | Status: AC
  Filled 2012-06-27: qty 2

## 2012-06-27 MED ORDER — FENTANYL CITRATE 0.05 MG/ML IJ SOLN
INTRAMUSCULAR | Status: AC
  Filled 2012-06-27: qty 2

## 2012-06-27 MED ORDER — MORPHINE SULFATE 2 MG/ML IJ SOLN
2.0000 mg | INTRAMUSCULAR | Status: DC | PRN
  Administered 2012-06-27: 2 mg via INTRAVENOUS
  Filled 2012-06-27: qty 1

## 2012-06-27 MED ORDER — PROMETHAZINE HCL 25 MG/ML IJ SOLN: 6.2500 mg | INTRAMUSCULAR | Status: DC | PRN

## 2012-06-27 MED ORDER — OXYCODONE HCL 5 MG PO TABS: 5.0000 mg | ORAL_TABLET | Freq: Once | ORAL | Status: DC | PRN

## 2012-06-27 MED ORDER — FENTANYL CITRATE 0.05 MG/ML IJ SOLN
INTRAMUSCULAR | Status: DC | PRN
  Administered 2012-06-27 (×3): 25 ug via INTRAVENOUS

## 2012-06-27 MED ORDER — HYDROCHLOROTHIAZIDE 10 MG/ML ORAL SUSPENSION
6.2500 mg | Freq: Every day | ORAL | Status: DC
  Filled 2012-06-27 (×2): qty 1.25

## 2012-06-27 MED ORDER — ONDANSETRON HCL 4 MG/2ML IJ SOLN
INTRAMUSCULAR | Status: DC | PRN
  Administered 2012-06-27: 4 mg via INTRAVENOUS

## 2012-06-27 MED ORDER — ONDANSETRON HCL 4 MG/2ML IJ SOLN
4.0000 mg | Freq: Four times a day (QID) | INTRAMUSCULAR | Status: DC | PRN
  Administered 2012-06-27: 4 mg via INTRAVENOUS
  Filled 2012-06-27: qty 2

## 2012-06-27 MED ORDER — FENTANYL CITRATE 0.05 MG/ML IJ SOLN
50.0000 ug | Freq: Once | INTRAMUSCULAR | Status: AC
  Administered 2012-06-27: 100 ug via INTRAVENOUS

## 2012-06-27 MED ORDER — SODIUM CHLORIDE 0.9 % IJ SOLN
INTRAMUSCULAR | Status: AC
  Filled 2012-06-27: qty 3

## 2012-06-27 MED ORDER — HYDROCODONE-ACETAMINOPHEN 5-325 MG PO TABS: 1.0000 | ORAL_TABLET | ORAL | Status: DC | PRN

## 2012-06-27 MED ORDER — SODIUM CHLORIDE 0.9 % IV SOLN
INTRAVENOUS | Status: DC
  Administered 2012-06-28: 02:00:00 via INTRAVENOUS

## 2012-06-27 MED ORDER — BUPIVACAINE HCL (PF) 0.25 % IJ SOLN
INTRAMUSCULAR | Status: AC
  Filled 2012-06-27: qty 30

## 2012-06-27 MED ORDER — HYDROMORPHONE HCL PF 1 MG/ML IJ SOLN
0.2500 mg | INTRAMUSCULAR | Status: DC | PRN
  Administered 2012-06-27 (×2): 0.5 mg via INTRAVENOUS

## 2012-06-27 MED ORDER — CYCLOBENZAPRINE HCL 10 MG PO TABS: 10.0000 mg | ORAL_TABLET | Freq: Every day | ORAL | Status: DC | PRN

## 2012-06-27 MED ORDER — METHYLENE BLUE 1 % INJ SOLN
INTRAMUSCULAR | Status: DC | PRN
  Administered 2012-06-27: 10 mL

## 2012-06-27 MED ORDER — LACTATED RINGERS IV SOLN
INTRAVENOUS | Status: DC | PRN
  Administered 2012-06-27 (×2): via INTRAVENOUS

## 2012-06-27 MED ORDER — TECHNETIUM TC 99M SULFUR COLLOID FILTERED
1.0000 | Freq: Once | INTRAVENOUS | Status: AC | PRN
  Administered 2012-06-27: 1 via INTRADERMAL

## 2012-06-27 MED ORDER — MEPERIDINE HCL 25 MG/ML IJ SOLN: 6.2500 mg | INTRAMUSCULAR | Status: DC | PRN

## 2012-06-27 MED ORDER — METHYLENE BLUE 1 % INJ SOLN
INTRAMUSCULAR | Status: AC
  Filled 2012-06-27: qty 10

## 2012-06-27 MED ORDER — PANTOPRAZOLE SODIUM 40 MG PO TBEC
40.0000 mg | DELAYED_RELEASE_TABLET | Freq: Every day | ORAL | Status: DC
  Administered 2012-06-27: 40 mg via ORAL
  Filled 2012-06-27: qty 1

## 2012-06-27 MED ORDER — BISOPROLOL-HYDROCHLOROTHIAZIDE 5-6.25 MG PO TABS: 1.0000 | ORAL_TABLET | Freq: Every day | ORAL | Status: DC

## 2012-06-27 MED ORDER — MUPIROCIN 2 % EX OINT
TOPICAL_OINTMENT | Freq: Once | CUTANEOUS | Status: AC
  Administered 2012-06-27: 14:00:00 via NASAL

## 2012-06-27 MED ORDER — ACETAMINOPHEN 650 MG RE SUPP: 650.0000 mg | Freq: Four times a day (QID) | RECTAL | Status: DC | PRN

## 2012-06-27 MED ORDER — HYDROMORPHONE HCL PF 1 MG/ML IJ SOLN
INTRAMUSCULAR | Status: AC
  Filled 2012-06-27: qty 1

## 2012-06-27 MED ORDER — DULOXETINE HCL 60 MG PO CPEP
60.0000 mg | ORAL_CAPSULE | Freq: Every day | ORAL | Status: DC
  Administered 2012-06-27: 60 mg via ORAL
  Filled 2012-06-27 (×2): qty 1

## 2012-06-27 MED ORDER — MIDAZOLAM HCL 2 MG/2ML IJ SOLN
1.0000 mg | INTRAMUSCULAR | Status: DC | PRN
  Administered 2012-06-27: 2 mg via INTRAVENOUS

## 2012-06-27 MED ORDER — ACETAMINOPHEN 325 MG PO TABS
650.0000 mg | ORAL_TABLET | Freq: Four times a day (QID) | ORAL | Status: DC | PRN
  Administered 2012-06-27: 650 mg via ORAL
  Filled 2012-06-27: qty 2

## 2012-06-27 MED ORDER — LEVOTHYROXINE SODIUM 100 MCG PO TABS
100.0000 ug | ORAL_TABLET | Freq: Every day | ORAL | Status: DC
  Administered 2012-06-28: 100 ug via ORAL
  Filled 2012-06-27 (×2): qty 1

## 2012-06-27 MED ORDER — BUPIVACAINE HCL (PF) 0.25 % IJ SOLN
INTRAMUSCULAR | Status: DC | PRN
  Administered 2012-06-27: 30 mL

## 2012-06-27 MED ORDER — HYDROMORPHONE HCL PF 1 MG/ML IJ SOLN: 0.2500 mg | INTRAMUSCULAR | Status: DC | PRN

## 2012-06-27 MED ORDER — SODIUM CHLORIDE 0.9 % IJ SOLN
INTRAMUSCULAR | Status: DC | PRN
  Administered 2012-06-27: 10 mL

## 2012-06-27 MED ORDER — BISOPROLOL FUMARATE 5 MG PO TABS
5.0000 mg | ORAL_TABLET | Freq: Every day | ORAL | Status: DC
  Administered 2012-06-27: 5 mg via ORAL
  Filled 2012-06-27 (×2): qty 1

## 2012-06-27 MED ORDER — MUPIROCIN 2 % EX OINT
TOPICAL_OINTMENT | CUTANEOUS | Status: AC
  Filled 2012-06-27: qty 22

## 2012-06-27 SURGICAL SUPPLY — 52 items
APPLIER CLIP 9.375 MED OPEN (MISCELLANEOUS) ×2
BENZOIN TINCTURE PRP APPL 2/3 (GAUZE/BANDAGES/DRESSINGS) IMPLANT
BINDER BREAST LRG (GAUZE/BANDAGES/DRESSINGS) IMPLANT
BINDER BREAST XLRG (GAUZE/BANDAGES/DRESSINGS) IMPLANT
BLADE SURG 10 STRL SS (BLADE) ×2 IMPLANT
BLADE SURG 15 STRL LF DISP TIS (BLADE) ×1 IMPLANT
BLADE SURG 15 STRL SS (BLADE) ×1
CANISTER SUCTION 2500CC (MISCELLANEOUS) ×2 IMPLANT
CHLORAPREP W/TINT 26ML (MISCELLANEOUS) ×2 IMPLANT
CLIP APPLIE 9.375 MED OPEN (MISCELLANEOUS) ×1 IMPLANT
CLOTH BEACON ORANGE TIMEOUT ST (SAFETY) ×2 IMPLANT
CLSR STERI-STRIP ANTIMIC 1/2X4 (GAUZE/BANDAGES/DRESSINGS) ×2 IMPLANT
CONT SPEC STER OR (MISCELLANEOUS) ×2 IMPLANT
COVER PROBE W GEL 5X96 (DRAPES) ×2 IMPLANT
COVER SURGICAL LIGHT HANDLE (MISCELLANEOUS) ×2 IMPLANT
DECANTER SPIKE VIAL GLASS SM (MISCELLANEOUS) ×2 IMPLANT
DERMABOND ADVANCED (GAUZE/BANDAGES/DRESSINGS) ×1
DERMABOND ADVANCED .7 DNX12 (GAUZE/BANDAGES/DRESSINGS) ×1 IMPLANT
DEVICE DUBIN SPECIMEN MAMMOGRA (MISCELLANEOUS) ×2 IMPLANT
DRAPE CHEST BREAST 15X10 FENES (DRAPES) ×2 IMPLANT
ELECT CAUTERY BLADE 6.4 (BLADE) ×2 IMPLANT
ELECT REM PT RETURN 9FT ADLT (ELECTROSURGICAL) ×2
ELECTRODE REM PT RTRN 9FT ADLT (ELECTROSURGICAL) ×1 IMPLANT
GLOVE BIO SURGEON STRL SZ7 (GLOVE) ×4 IMPLANT
GLOVE BIOGEL PI IND STRL 7.5 (GLOVE) ×1 IMPLANT
GLOVE BIOGEL PI INDICATOR 7.5 (GLOVE) ×1
GOWN STRL NON-REIN LRG LVL3 (GOWN DISPOSABLE) ×4 IMPLANT
KIT BASIN OR (CUSTOM PROCEDURE TRAY) ×2 IMPLANT
KIT MARKER MARGIN INK (KITS) IMPLANT
KIT ROOM TURNOVER OR (KITS) ×2 IMPLANT
NEEDLE 18GX1X1/2 (RX/OR ONLY) (NEEDLE) ×2 IMPLANT
NEEDLE HYPO 25GX1X1/2 BEV (NEEDLE) ×4 IMPLANT
NS IRRIG 1000ML POUR BTL (IV SOLUTION) ×2 IMPLANT
PACK SURGICAL SETUP 50X90 (CUSTOM PROCEDURE TRAY) ×2 IMPLANT
PAD ARMBOARD 7.5X6 YLW CONV (MISCELLANEOUS) ×2 IMPLANT
PENCIL BUTTON HOLSTER BLD 10FT (ELECTRODE) ×2 IMPLANT
SPONGE GAUZE 4X4 12PLY (GAUZE/BANDAGES/DRESSINGS) IMPLANT
SPONGE LAP 18X18 X RAY DECT (DISPOSABLE) ×2 IMPLANT
STAPLER VISISTAT 35W (STAPLE) ×2 IMPLANT
STOCKINETTE IMPERVIOUS 9X36 MD (GAUZE/BANDAGES/DRESSINGS) IMPLANT
STRIP CLOSURE SKIN 1/2X4 (GAUZE/BANDAGES/DRESSINGS) IMPLANT
SUT MNCRL AB 4-0 PS2 18 (SUTURE) ×4 IMPLANT
SUT SILK 2 0 SH (SUTURE) IMPLANT
SUT VIC AB 2-0 SH 27 (SUTURE) ×2
SUT VIC AB 2-0 SH 27XBRD (SUTURE) ×2 IMPLANT
SUT VIC AB 3-0 SH 27 (SUTURE) ×2
SUT VIC AB 3-0 SH 27XBRD (SUTURE) ×2 IMPLANT
SYR CONTROL 10ML LL (SYRINGE) ×4 IMPLANT
TOWEL OR 17X24 6PK STRL BLUE (TOWEL DISPOSABLE) ×2 IMPLANT
TOWEL OR 17X26 10 PK STRL BLUE (TOWEL DISPOSABLE) ×2 IMPLANT
TUBE CONNECTING 12X1/4 (SUCTIONS) ×2 IMPLANT
YANKAUER SUCT BULB TIP NO VENT (SUCTIONS) ×2 IMPLANT

## 2012-06-27 NOTE — H&P (View-Only) (Signed)
Patient ID: Maria Mckinney, female   DOB: 12/13/1950, 61 y.o.   MRN: [id number]  Chief Complaint  Patient presents with  . Routine Post Op    port a cath 02/22/12    HPI Maria Mckinney is a 62 y.o. female.   HPI This is a 62-year-old female I initially saw in the thoracic multidisciplinary clinic in October of 2013. At that time she had undergone a mammogram and ultrasound that showed a linear asymmetry in the upper outer quadrant of the left breast. She underwent stereotactic biopsy and initially a clip migrated. 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 Her proliferation index was 85%. There was a larger area on her mammogram which ended up being about 4 x 2 cm and a cylinder shaped but we discussed proceeding with primary systemic therapy and make that smaller if she very much wanted to undergo breast conservation therapy. She has completed her chemotherapy in early January. She is been continued on herceptin with the port I placed. She was hospitalized for 11 days at the end of January. This was after she developed some significant nausea, urinary tract infection was sounds like neutropenia. She left the hospital on January 20. She remains very weak and is getting daily IV fluids at the cancer center still. She is very slowly regaining her strength at this point. During our visit today she actually stood up and tripped and I call her so she did not fall. She is not very steady on her feet. She comes in today to discuss her surgery options.  Past Medical History  Diagnosis Date  . HTN (hypertension)   . Fibromyalgia   . GERD (gastroesophageal reflux disease)   . Arthritis   . Anxiety   . Dysrhythmia     irreg hr  . Wears hearing aid     left ear  . 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 PONV (postoperative nausea and vomiting)     Past Surgical History  Procedure Date  .  Appendectomy   . Combined hysteroscopy diagnostic / d&c   . Cochlear implant   . Portacath placement 02/22/2012    Procedure: INSERTION PORT-A-CATH;  Surgeon: Raymont Andreoni, MD;  Location: [facility];  Service: General;  Laterality: Right;    Family History  Problem Relation Age of Onset  . Cancer Mother     "tumor of face removed"  . Cancer Father     kidney removed  . Cancer Maternal Aunt     stomach & Pancreatic  . Cancer Paternal Uncle     pancreatic    Social History History  Substance Use Topics  . Smoking status: Never Smoker   . Smokeless tobacco: Never Used  . Alcohol Use: 0.0 oz/week    0-1 Glasses of wine per week    Allergies  Allergen Reactions  . Acyclovir And Related   . Ciprofloxacin Hcl Rash  . Bactrim (Sulfamethoxazole W-Trimethoprim)     Current Outpatient Prescriptions  Medication Sig Dispense Refill  . bisoprolol-hydrochlorothiazide (ZIAC) 5-6.25 MG per tablet Take 1 tablet by mouth daily.      . cyclobenzaprine (FLEXERIL) 10 MG tablet Take 10 mg by mouth 3 (three) times daily as needed. For muscle pain.      . DULoxetine (CYMBALTA) 60 MG capsule Take 60 mg by mouth daily.      . esomeprazole (NEXIUM)   40 MG capsule Take 1 capsule (40 mg total) by mouth daily before breakfast.  30 capsule  6  . HYDROcodone-acetaminophen (VICODIN) 5-500 MG per tablet Take 1 tablet by mouth every 6 (six) hours as needed. For pain.      . levothyroxine (SYNTHROID, LEVOTHROID) 100 MCG tablet Take 100 mcg by mouth daily.      . lidocaine-prilocaine (EMLA) cream Apply 1 application topically as needed. For port-a-cath access.      . LORazepam (ATIVAN) 0.5 MG tablet       . meloxicam (MOBIC) 15 MG tablet Take 15 mg by mouth daily.      . mometasone (NASONEX) 50 MCG/ACT nasal spray Place 2 sprays into the nose daily.      . ondansetron (ZOFRAN) 8 MG tablet Take 1 tablet (8 mg total) by mouth 2 (two) times daily. Take BID starting the day after chemo x 3  days. Then BID PRN nausea or vomiting.  30 tablet  1  . potassium chloride SA (K-DUR,KLOR-CON) 20 MEQ tablet Take 1 tablet (20 mEq total) by mouth daily.  30 tablet  0  . PRESCRIPTION MEDICATION Inject into the vein every 7 (seven) days. Herceptin infusion every Wednesday.      . prochlorperazine (COMPAZINE) 10 MG tablet       . promethazine (PHENERGAN) 25 MG tablet Take 1 tablet (25 mg total) by mouth every 6 (six) hours as needed for nausea.  60 tablet  0  . pseudoephedrine (SUDAFED) 30 MG tablet Take 30 mg by mouth every 4 (four) hours as needed.      . sucralfate (CARAFATE) 1 GM/10ML suspension Take 10 mLs (1 g total) by mouth 4 (four) times daily.  420 mL  0  . Alum & Mag Hydroxide-Simeth (MAGIC MOUTHWASH W/LIDOCAINE) SOLN Take 5 mLs by mouth 4 (four) times daily.  200 mL  6   No current facility-administered medications for this visit.   Facility-Administered Medications Ordered in Other Visits  Medication Dose Route Frequency Provider Last Rate Last Dose  . sodium chloride 0.9 % injection 10 mL  10 mL Intravenous PRN Lindsey Alexander, NP   10 mL at 06/08/12 1231    Review of Systems Review of Systems  Constitutional: Positive for appetite change and fatigue. Negative for fever, chills and unexpected weight change.  HENT: Negative for hearing loss, congestion, sore throat, trouble swallowing and voice change.   Eyes: Negative for visual disturbance.  Respiratory: Negative for cough and wheezing.   Cardiovascular: Negative for chest pain, palpitations and leg swelling.  Gastrointestinal: Positive for nausea. Negative for vomiting, abdominal pain, diarrhea, constipation, blood in stool, abdominal distention and anal bleeding.  Genitourinary: Negative for hematuria, vaginal bleeding and difficulty urinating.  Musculoskeletal: Negative for arthralgias.  Skin: Negative for rash and wound.  Neurological: Negative for seizures, syncope and headaches.  Hematological: Negative for  adenopathy. Does not bruise/bleed easily.  Psychiatric/Behavioral: Negative for confusion.    Blood pressure 130/76, pulse 84, temperature 97.7 F (36.5 C), resp. rate 18, height 5' 2" (1.575 m), weight 174 lb (78.926 kg).  Physical Exam Physical Exam  Vitals reviewed. Constitutional: She appears well-developed and well-nourished.  Neck: Neck supple.  Cardiovascular: Normal rate, regular rhythm and normal heart sounds.   Pulmonary/Chest: Effort normal. She has no wheezes. She has no rales. Right breast exhibits no inverted nipple, no mass, no nipple discharge, no skin change and no tenderness. Left breast exhibits no inverted nipple, no mass, no nipple discharge, no skin change   and no tenderness. Breasts are symmetrical.  Abdominal: Soft. There is no tenderness.  Lymphadenopathy:    She has no cervical adenopathy.    Data Reviewed Latest mmg from [facility]  Assessment    Left breast cancer s/p primary chemotherapy    Plan    Left breast wire guided lumpectomy, left axillary sentinel node biopsy I will discuss with medical oncology but I think it is best to wait to 3 weeks to do her surgery given her recent hospitalization issues she's had. I will plan on scheduling her for her left breast wire-guided lumpectomy and left axillary sentinel lymph node biopsy in 2-3 weeks. We discussed the staging and pathophysiology of breast cancer. We discussed all of the different options for treatment for breast cancer including surgery, chemotherapy, radiation therapy, Herceptin, and antiestrogen therapy.   We discussed a sentinel lymph node biopsy as she does not appear to having lymph node involvement right now. We discussed the performance of that with injection of radioactive tracer and blue dye. We discussed that she would have an incision underneath her axillary hairline. We discussed that there is a bout a 10-20% chance of having a positive node with a sentinel lymph node biopsy and we will await  the permanent pathology to make any other first further decisions in terms of her treatment. One of these options might be to return to the operating room to perform an axillary lymph node dissection. We discussed about a 1-2% risk lifetime of chronic shoulder pain as well as lymphedema associated with a sentinel lymph node biopsy.  We discussed the options for treatment of the breast cancer which included lumpectomy versus a mastectomy. We discussed the performance of the lumpectomy with a wire placement. We discussed a 10-20% chance of a positive margin requiring reexcision in the operating room. We also discussed that she may need radiation therapy or antiestrogen therapy or both if she undergoes lumpectomy. We discussed the mastectomy and the postoperative care for that as well. We discussed that there is no difference in her survival whether she undergoes lumpectomy with radiation therapy or antiestrogen therapy versus a mastectomy. There is a slight difference in the local recurrence rate being 3-5% with lumpectomy and about 1% with a mastectomy. We discussed the risks of operation including bleeding, infection, possible reoperation. She understands her further therapy will be based on what her stages at the time of her operation.         Dillion Stowers 06/08/2012, 2:17 PM    

## 2012-06-27 NOTE — Interval H&P Note (Signed)
History and Physical Interval Note:  06/27/2012 2:50 PM  Maria Mckinney  has presented today for surgery, with the diagnosis of breast cancer   The various methods of treatment have been discussed with the patient and family. After consideration of risks, benefits and other options for treatment, the patient has consented to  Procedure(s) with comments: BREAST LUMPECTOMY WITH NEEDLE LOCALIZATION AND AXILLARY SENTINEL LYMPH NODE BX (Left) - left breast wire localized at [facility] lumpectomy with left ax slnbx nuclear med 1400 as a surgical intervention .  The patient's history has been reviewed, patient examined, no change in status, stable for surgery.  I have reviewed the patient's chart and labs.  Questions were answered to the patient's satisfaction.     Maria Mckinney

## 2012-06-27 NOTE — Transfer of Care (Signed)
Immediate Anesthesia Transfer of Care Note  Patient: AMERICA SANDALL  Procedure(s) Performed: Procedure(s) with comments: BREAST LUMPECTOMY WITH NEEDLE LOCALIZATION AND AXILLARY SENTINEL LYMPH NODE BIOPSY (Left) - left breast wire localized at [facility] 1130 lumpectomy with left axillary sentinel node biopsy nuclear med 1400  Patient Location: PACU  Anesthesia Type:General  Level of Consciousness: awake, alert , oriented and patient cooperative  Airway & Oxygen Therapy: Patient Spontanous Breathing and Patient connected to nasal cannula oxygen  Post-op Assessment: Report given to PACU RN, Post -op Vital signs reviewed and stable and Patient moving all extremities  Post vital signs: Reviewed and stable  Complications: No apparent anesthesia complications

## 2012-06-27 NOTE — Anesthesia Postprocedure Evaluation (Signed)
  Anesthesia Post-op Note  Patient: Maria Mckinney  Procedure(s) Performed: Procedure(s) with comments: BREAST LUMPECTOMY WITH NEEDLE LOCALIZATION AND AXILLARY SENTINEL LYMPH NODE BIOPSY (Left) - left breast wire localized at [facility] lumpectomy with left axillary sentinel node biopsy nuclear med 1400  Patient Location: PACU  Anesthesia Type:General  Level of Consciousness: awake  Airway and Oxygen Therapy: Patient Spontanous Breathing  Post-op Pain: mild  Post-op Assessment: Post-op Vital signs reviewed  Post-op Vital Signs: Reviewed  Complications: No apparent anesthesia complications

## 2012-06-27 NOTE — Anesthesia Preprocedure Evaluation (Addendum)
Anesthesia Evaluation  Patient identified by MRN, date of birth, ID band Patient awake    Reviewed: Allergy & Precautions, H&P , NPO status , Patient's Chart, lab work & pertinent test results, reviewed documented beta blocker date and time   History of Anesthesia Complications (+) PONV  Airway Mallampati: II TM Distance: >3 FB Neck ROM: Full    Dental  (+) Dental Advisory Given and Teeth Intact   Pulmonary  breath sounds clear to auscultation        Cardiovascular hypertension, Pt. on medications Rhythm:Regular Rate:Normal     Neuro/Psych Anxiety  Neuromuscular disease    GI/Hepatic GERD-  Medicated and Controlled,  Endo/Other    Renal/GU      Musculoskeletal  (+) Fibromyalgia -  Abdominal   Peds  Hematology   Anesthesia Other Findings   Reproductive/Obstetrics                          Anesthesia Physical Anesthesia Plan  ASA: III  Anesthesia Plan: General   Post-op Pain Management:    Induction: Intravenous  Airway Management Planned: LMA  Additional Equipment:   Intra-op Plan:   Post-operative Plan: Extubation in OR  Informed Consent: I have reviewed the patients History and Physical, chart, labs and discussed the procedure including the risks, benefits and alternatives for the proposed anesthesia with the patient or authorized representative who has indicated his/her understanding and acceptance.   Dental advisory given  Plan Discussed with: Anesthesiologist and Surgeon  Anesthesia Plan Comments:         Anesthesia Quick Evaluation

## 2012-06-27 NOTE — Preoperative (Signed)
Beta Blockers   Reason not to administer Beta Blockers:Not Applicable 

## 2012-06-27 NOTE — Anesthesia Procedure Notes (Signed)
Procedure Name: LMA Insertion Date/Time: 06/27/2012 3:22 PM Performed by: Jerilee Hoh Pre-anesthesia Checklist: Patient identified, Emergency Drugs available, Suction available and Patient being monitored Patient Re-evaluated:Patient Re-evaluated prior to inductionOxygen Delivery Method: Circle system utilized Preoxygenation: Pre-oxygenation with 100% oxygen Intubation Type: IV induction LMA: LMA inserted LMA Size: 4.0 Tube type: Oral Number of attempts: 1 Placement Confirmation: positive ETCO2 and breath sounds checked- equal and bilateral Tube secured with: Tape Dental Injury: Teeth and Oropharynx as per pre-operative assessment

## 2012-06-27 NOTE — Op Note (Signed)
Preoperative diagnosis: Left breast cancer s/p primary chemotherapy Postoperative diagnosis: same as above Procedure: 1. Left breast wire guided bracketed lumpectomy 2. Left axillary sentinel node biopsy Surgeon: Dr. Harden Mo Anesthesia: Gen. With LMA Specimens: #1 left breast tissue marked with paints #2 left axillary sentinel lymph node x3 with counts ranging from [phone] Complications: None Drains: None This blood loss: Minimal Sponge count was correct x2 in the operation Disposition to recovery room in stable condition  Indications: This is a 62 year old female who initially saw me breast multidisciplinary clinic with a newly diagnosed left breast cancer. We decided at that point to begin her on primary chemotherapy to see if we could make this area smaller. She has had a decent response by mammography to her chemotherapy. At the completion of chemotherapy we discussed her options and decided to proceed with a lumpectomy and a sentinel lymph node biopsy. We delayed the surgery as she had been admitted to the hospital at the completion of her chemotherapy.  Procedure: After informed consent was obtained the patient was first taken to the breast center. She had 2 wires placed to bracket the cylinder shaped lesion in the left upper outer quadrant. I had these mammograms available for my review. She was given 2 grams of intravenous cefazolin. Sequential compression devices were on the legs. She was then placed under general anesthesia with an LMA. Her left breast was then prepped and draped in the standard sterile surgical fashion. Surgical timeout was performed.  I infiltrated 1 cc of methylene blue and saline mixture in all 4 of the cardinal positions around the nipple areolar complex. I massaged this for 2 minutes. I then made a curvilinear incision in the breast. This was between the 2 wires and overlying the area that need to be removed. I then used cautery to excise both of the wires  as well as the tissue in between the two. This was done down to the pectoralis muscle for the most part of the tumor. A mammogram was then taken confirming removal of the clips, wires, lesions. This was also confirmed by radiology. It appeared I had adequate margins as well. I then did a sentinel lymph node biopsy through the same incision as I was in her upper outer quadrant. I entered her axillary fascia with cautery. I then identified 3 sentinel lymph nodes with counts as listed above. One of these was also blue. The background radioactivity was less than 90. There is no more blue dye. I then closed the axillary fascia with 2-0 Vicryl. Hemostasis was obtained. I closed the deep breast tissue 2-0 Vicryl. I placed 2 clips deep and one clip in each position around my cavity as well. I closed the dermis with 3-0 Vicryl and skin with 4-0 Monocryl. I infiltrated a total of 20 cc of quarter percent Marcaine. I then placed the dermabond abd Steri-Strips. A dressing was placed and a breast binder also. She tolerated as well as extubated and transferred to recovery stable.

## 2012-06-28 ENCOUNTER — Encounter (HOSPITAL_COMMUNITY): Payer: Self-pay | Admitting: General Surgery

## 2012-06-28 MED ORDER — HYDROCODONE-ACETAMINOPHEN 5-325 MG PO TABS: 1.0000 | ORAL_TABLET | ORAL | Status: DC | PRN

## 2012-06-28 NOTE — Discharge Summary (Signed)
Physician Discharge Summary  Patient ID: Maria Mckinney MRN: [id number] DOB/AGE: 1950-09-17 62 y.o.  Admit date: 06/27/2012 Discharge date: 06/28/2012  Admission Diagnoses: Left breast cancer s/p primary chemotherapy  Discharge Diagnoses:  Active Problems:   * No active hospital problems. *   Discharged Condition: good  Hospital Course: 14 yof who underwent primary chemotherapy for left breast cancer and then underwent bracketed left lumpectomy and sentinel node biopsy. She has done well overnight.  Had some nausea that has resolved but is doing well this morning.  She is ready for discharge.  Consults: None  Significant Diagnostic Studies: none  Treatments: surgery: left breast lumpectomy, left axillary sentinel node biopsy  Discharge Exam: Blood pressure 115/63, pulse 71, temperature 98.4 F (36.9 C), temperature source Oral, resp. rate 16, height 5\' 2"  (1.575 m), weight 165 lb 9.1 oz (75.1 kg), SpO2 98.00%. General appearance: no distress Resp: clear to auscultation bilaterally Breasts: incision clean without infection, no hematoma Cardio: regular rate and rhythm  Disposition: 01-Home or Self Care   Future Appointments Provider Department Dept Phone   07/04/2012 12:45 PM Delcie Roch [facility] MED[facility] [phone]   07/04/2012 1:15 PM Augustin Schooling, NP [facility] MEDICAL ONCOLOGY [phone]   07/04/2012 2:15 PM [facility] [place] [facility] [phone]   07/09/2012 1:30 PM [name], MD [facility] Surgery, [place] [phone]   07/25/2012 10:15 AM Beverely Pace [facility] [place] [facility] MEDICAL ONCOL[facility]Y [phone]   07/25/2012 10:45 AM Augustin Schooling, NP [place] [facility] ONCOL[facility] [phone]   07/25/2012 11:45 AM [facility] [place] [facility] ON[facility] [phone]       Medication List    TAKE these medications       bisoprolol-hydrochlorothiazide 5-6.25 MG per tablet  Commonly known as:  ZIAC  Take 1 tablet by mouth daily.     cyclobenzaprine 10 MG tablet  Commonly known as:  FLEXERIL  Take 10 mg by mouth daily as needed for muscle spasms. For muscle pain.     DULoxetine 60 MG capsule  Commonly known as:  CYMBALTA  Take 60 mg by mouth daily.     esomeprazole 40 MG capsule  Commonly known as:  NEXIUM  Take 1 capsule (40 mg total) by mouth daily before breakfast.     HYDROcodone-acetaminophen 5-325 MG per tablet  Commonly known as:  NORCO/VICODIN  Take 1-2 tablets by mouth every 4 (four) hours as needed.     HYDROcodone-acetaminophen 5-325 MG per tablet  Commonly known as:  NORCO/VICODIN  Take 1 tablet by mouth every 6 (six) hours as needed for pain.     levothyroxine 100 MCG tablet  Commonly known as:  SYNTHROID, LEVOTHROID  Take 100 mcg by mouth daily.     lidocaine-prilocaine cream  Commonly known as:  EMLA  Apply 1 application topically as needed. For port-a-cath access.     meloxicam 15 MG tablet  Commonly known as:  MOBIC  Take 15 mg by mouth daily.     mometasone 50 MCG/ACT nasal spray  Commonly known as:  NASONEX  Place 2 sprays into the nose daily.     ondansetron 8 MG tablet  Commonly known as:  ZOFRAN  Take 8 mg by mouth every 12 (twelve) hours as needed. Take BID starting the day after chemo x 3 days. Then BID PRN nausea or vomiting.     potassium chloride SA 20 MEQ tablet  Commonly known as:  K-DUR,KLOR-CON  Take 20 mEq by mouth 2 (two) times daily.     PRESCRIPTION MEDICATION  Inject into the vein. Herceptin infusion every 21 days     prochlorperazine 10 MG tablet  Commonly known as:  COMPAZINE  Take 10 mg by mouth 2 (two) times daily as needed (for nausea).     pseudoephedrine 30 MG tablet  Commonly known as:  SUDAFED  Take 30 mg by mouth every 6 (six) hours as needed for congestion.           Follow-up Information   Follow up with [facility],  MD In 2 weeks.   Contact information:   [address] [place] [address] [phone]       Signed: [name] 06/28/2012, 9:31 AM

## 2012-07-04 ENCOUNTER — Other Ambulatory Visit: Payer: BC Managed Care – PPO | Admitting: Lab

## 2012-07-04 ENCOUNTER — Ambulatory Visit (HOSPITAL_BASED_OUTPATIENT_CLINIC_OR_DEPARTMENT_OTHER): Payer: BC Managed Care – PPO

## 2012-07-04 ENCOUNTER — Ambulatory Visit: Payer: BC Managed Care – PPO | Admitting: Adult Health

## 2012-07-04 ENCOUNTER — Encounter: Payer: Self-pay | Admitting: Adult Health

## 2012-07-04 ENCOUNTER — Other Ambulatory Visit (HOSPITAL_BASED_OUTPATIENT_CLINIC_OR_DEPARTMENT_OTHER): Payer: BC Managed Care – PPO | Admitting: Lab

## 2012-07-04 ENCOUNTER — Ambulatory Visit (HOSPITAL_BASED_OUTPATIENT_CLINIC_OR_DEPARTMENT_OTHER): Payer: BC Managed Care – PPO | Admitting: Adult Health

## 2012-07-04 ENCOUNTER — Ambulatory Visit: Payer: BC Managed Care – PPO

## 2012-07-04 VITALS — BP 129/75 | HR 85 | Temp 98.2°F | Resp 20 | Ht 62.0 in | Wt 167.1 lb

## 2012-07-04 DIAGNOSIS — C50412 Malignant neoplasm of upper-outer quadrant of left female breast: Secondary | ICD-10-CM

## 2012-07-04 DIAGNOSIS — C50419 Malignant neoplasm of upper-outer quadrant of unspecified female breast: Secondary | ICD-10-CM

## 2012-07-04 LAB — CBC WITH DIFFERENTIAL/PLATELET
BASO%: 0.2 % (ref 0.0–2.0)
HCT: 31.7 % — ABNORMAL LOW (ref 34.8–46.6)
HGB: 10.7 g/dL — ABNORMAL LOW (ref 11.6–15.9)
MCHC: 33.8 g/dL (ref 31.5–36.0)
MONO#: 0.2 10*3/uL (ref 0.1–0.9)
NEUT#: 1.9 10*3/uL (ref 1.5–6.5)
NEUT%: 48.1 % (ref 38.4–76.8)
WBC: 4 10*3/uL (ref 3.9–10.3)
lymph#: 1.7 10*3/uL (ref 0.9–3.3)
nRBC: 0 % (ref 0–0)

## 2012-07-04 LAB — COMPREHENSIVE METABOLIC PANEL (CC13)
ALT: 22 U/L (ref 0–55)
AST: 33 U/L (ref 5–34)
Albumin: 3.2 g/dL — ABNORMAL LOW (ref 3.5–5.0)
BUN: 10.9 mg/dL (ref 7.0–26.0)
Calcium: 7.9 mg/dL — ABNORMAL LOW (ref 8.4–10.4)
Chloride: 101 mEq/L (ref 98–107)
Potassium: 3.1 mEq/L — ABNORMAL LOW (ref 3.5–5.1)

## 2012-07-04 MED ORDER — ACETAMINOPHEN 325 MG PO TABS
650.0000 mg | ORAL_TABLET | Freq: Once | ORAL | Status: AC
  Administered 2012-07-04: 650 mg via ORAL

## 2012-07-04 MED ORDER — TRASTUZUMAB CHEMO INJECTION 440 MG
6.0000 mg/kg | Freq: Once | INTRAVENOUS | Status: AC
  Administered 2012-07-04: 462 mg via INTRAVENOUS
  Filled 2012-07-04: qty 22

## 2012-07-04 MED ORDER — SODIUM CHLORIDE 0.9 % IJ SOLN
10.0000 mL | INTRAMUSCULAR | Status: DC | PRN
  Administered 2012-07-04: 10 mL
  Filled 2012-07-04: qty 10

## 2012-07-04 MED ORDER — SODIUM CHLORIDE 0.9 % IV SOLN
Freq: Once | INTRAVENOUS | Status: AC
  Administered 2012-07-04: 15:00:00 via INTRAVENOUS

## 2012-07-04 MED ORDER — HEPARIN SOD (PORK) LOCK FLUSH 100 UNIT/ML IV SOLN
500.0000 [IU] | Freq: Once | INTRAVENOUS | Status: AC | PRN
  Administered 2012-07-04: 500 [IU]
  Filled 2012-07-04: qty 5

## 2012-07-04 NOTE — Progress Notes (Signed)
Maria Mckinney [id number] January 04, 1951 62 y.o. 07/04/2012 3:08 PM  CC  Maria Ravel, MD Dr. [name] [address] [place] [address] Dr. [name] Dr. [name] Dr. 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  PRIOR THERAPY:  #1Patient was originally seen in the multidisciplinary breast clinic for new diagnosis of left breast cancer that was 8mm in the upper outer quadrant of the left breast.  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  There was an additional 4 cm area of normal signal.  Patient did not get an MRI secondary to cochlear implant.  She desires breast conservation.    #2 Patient is s/p port-a-cath placement for neoadjuvant chemotherapy consisting of Taxotere/carboplatinum/Herceptin.  She will begin cycle #1 on 02/29/2012.  She will receive Taxotere and carboplatinum every 21 days with Herceptin giving weekly.  She will receive a total of 4 cycles of Taxotere and Carboplatinum--which completed on 05/03/12.    #3 Herceptin every 3 weeks beginning 05/23/12  CURRENT THERAPY: Herceptin q3 weeks  INTERVAL HISTORY: Ms. Pincock returns today for follow up.  She had her surgery on 2/26.  She underwent lumpectomy with Dr. Dwain Sarna and 0/3 sentinel nodes were negative.  The tissue from the lumpectomy was also negative.  She would like to transfer her radiation and Herceptin appts to [place] so she can care for her father better.  Otherwise, she is doing well and a 10 point ROS is neg.   Past Medical History: Past Medical History  Diagnosis Date  . HTN (hypertension)   . Fibromyalgia   . GERD (gastroesophageal reflux disease)   . Arthritis   . Anxiety   . Dysrhythmia     irreg hr  . Wears hearing aid     left ear  . 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 PONV  (postoperative nausea and vomiting)     Past Surgical History: Past Surgical History  Procedure Laterality Date  . Appendectomy    . Combined hysteroscopy diagnostic / d&c    . Cochlear implant    . Portacath placement  02/22/2012    Procedure: INSERTION PORT-A-CATH;  Surgeon: [name], MD;  Location: [facility];  Service: General;  Laterality: Right;  . Breast lumpectomy with needle localization and axillary sentinel lymph node bx Left 06/27/2012    Procedure: BREAST LUMPECTOMY WITH NEEDLE LOCALIZATION AND AXILLARY SENTINEL LYMPH NODE BIOPSY;  Surgeon: [name], MD;  Location: [facility] OR;  Service: General;  Laterality: Left;  left breast wire localized at [facility] lumpectomy with left axillary sentinel node biopsy nuclear med 1400    Family History: Family History  Problem Relation Age of Onset  . Cancer Mother     "tumor of face removed"  . Cancer Father     kidney removed  . Cancer Maternal Aunt     stomach & Pancreatic  . Cancer Paternal Uncle     pancreatic    Social History History  Substance Use Topics  . Smoking status: Never Smoker   . Smokeless tobacco: Never Used  . Alcohol Use: 0.0 oz/week    0-1 Glasses of wine per week    Allergies: Allergies  Allergen Reactions  . Acyclovir And Related   . Ciprofloxacin Hcl Rash  . Bactrim (Sulfamethoxazole W-Trimethoprim)     Current Medications: Current  Outpatient Prescriptions  Medication Sig Dispense Refill  . bisoprolol-hydrochlorothiazide (ZIAC) 5-6.25 MG per tablet Take 1 tablet by mouth daily.      . cyclobenzaprine (FLEXERIL) 10 MG tablet Take 10 mg by mouth daily as needed for muscle spasms. For muscle pain.      . DULoxetine (CYMBALTA) 60 MG capsule Take 60 mg by mouth daily.      Marland Kitchen esomeprazole (NEXIUM) 40 MG capsule Take 1 capsule (40 mg total) by mouth daily before breakfast.  30 capsule  6  . HYDROcodone-acetaminophen (NORCO/VICODIN) 5-325 MG per tablet Take 1 tablet by  mouth every 6 (six) hours as needed for pain.      Marland Kitchen HYDROcodone-acetaminophen (NORCO/VICODIN) 5-325 MG per tablet Take 1-2 tablets by mouth every 4 (four) hours as needed.  30 tablet  0  . levothyroxine (SYNTHROID, LEVOTHROID) 100 MCG tablet Take 100 mcg by mouth daily.      Marland Kitchen lidocaine-prilocaine (EMLA) cream Apply 1 application topically as needed. For port-a-cath access.      . meloxicam (MOBIC) 15 MG tablet Take 15 mg by mouth daily.      . mometasone (NASONEX) 50 MCG/ACT nasal spray Place 2 sprays into the nose daily.      Marland Kitchen PRESCRIPTION MEDICATION Inject into the vein. Herceptin infusion every 21 days      . pseudoephedrine (SUDAFED) 30 MG tablet Take 30 mg by mouth every 6 (six) hours as needed for congestion.        No current facility-administered medications for this visit.   Facility-Administered Medications Ordered in Other Visits  Medication Dose Route Frequency Provider Last Rate Last Dose  . heparin lock flush 100 unit/mL  500 Units Intracatheter Once PRN Augustin Schooling, NP      . sodium chloride 0.9 % injection 10 mL  10 mL Intracatheter PRN Augustin Schooling, NP      . trastuzumab (HERCEPTIN) 462 mg in sodium chloride 0.9 % 250 mL chemo infusion  6 mg/kg (Treatment Plan Actual) Intravenous Once Augustin Schooling, NP        Health Maintenance:  ColonoscopyShe has never had a colonoscopy Bone DensityBone density about 10-12 years ago Last PAP smearLast Pap smear was in 2012   REVIEW OF SYSTEMS: General: fatigue (-), night sweats (-), fever (-), pain (-) Lymph: palpable nodes (-) HEENT: vision changes (-), mucositis (-), gum bleeding (-), epistaxis (-) Cardiovascular: chest pain (-), palpitations (-) Pulmonary: shortness of breath (-), dyspnea on exertion (-), cough (-), hemoptysis (-) GI:  Early satiety (-), melena (-), dysphagia (-), nausea/vomiting (-), diarrhea (-) GU: dysuria (-), hematuria (-), incontinence (-) Musculoskeletal: joint swelling (-), joint pain  (-), back pain (-) Neuro: weakness (-), numbness (-), headache (-), confusion (-) Skin: Rash (-), lesions (-), dryness (-) Psych: depression (-), suicidal/homicidal ideation (-), feeling of hopelessness (-)   PHYSICAL EXAMINATION: Blood pressure 129/75, pulse 85, temperature 98.2 F (36.8 C), temperature source Oral, resp. rate 20, height 5\' 2"  (1.575 m), weight 167 lb 1.6 oz (75.796 kg). General: Patient is a well appearing female in no acute distress HEENT: PERRLA, sclerae anicteric no conjunctival pallor, MMM,  Neck: supple, no palpable adenopathy Lungs: clear to auscultation bilaterally, no wheezes, rhonchi, or rales Cardiovascular: regular rate rhythm, S1, S2, no murmurs, rubs or gallops,  Abdomen: Soft, non-tender, non-distended, normoactive bowel sounds, no HSM Extremities: warm and well perfused, no clubbing, cyanosis, or edema Skin: No rashes or lesions Neuro: Non-focal ECOG PERFORMANCE STATUS: 1 - Symptomatic but completely ambulatory Breasts:  left breast lumpectomy site healing well, clean dry and intact, no nodularity, right breast: no masses or nodularity  STUDIES/RESULTS: No results found.   LABS:    Chemistry      Component Value Date/Time   NA 139 06/21/2012 1235   NA 140 06/20/2012 1609   K 4.1 06/21/2012 1235   K 3.9 06/20/2012 1609   CL 100 06/21/2012 1235   CL 102 06/20/2012 1609   CO2 27 06/21/2012 1235   CO2 26 06/20/2012 1609   BUN 14 06/21/2012 1235   BUN 12.6 06/20/2012 1609   CREATININE 1.24* 06/21/2012 1235   CREATININE 1.3* 06/20/2012 1609      Component Value Date/Time   CALCIUM 7.4* 06/21/2012 1235   CALCIUM 7.2* 06/20/2012 1609   ALKPHOS 174* 06/13/2012 1046   ALKPHOS 59 05/10/2012 1909   AST 39* 06/13/2012 1046   AST 30 05/10/2012 1909   ALT 22 06/13/2012 1046   ALT 69* 05/10/2012 1909   BILITOT 1.08 06/13/2012 1046   BILITOT 3.9* 05/10/2012 1909      Lab Results  Component Value Date   WBC 4.0 07/04/2012   HGB 10.7* 07/04/2012   HCT 31.7* 07/04/2012    MCV 96.1 07/04/2012   PLT 133* 07/04/2012      PATHOLOGY:  ADDITIONAL INFORMATION: PROGNOSTIC INDICATORS - ACIS Results IMMUNOHISTOCHEMICAL AND MORPHOMETRIC ANALYSIS BY THE AUTOMATED CELLULAR IMAGING SYSTEM (ACIS) Estrogen Receptor (Negative, <1%): 0%, NEGATIVE Progesterone Receptor (Negative, <1%): 0%, NEGATIVE Proliferation Marker Ki67 by M IB-1 (Low<20%): 98% All controls stained appropriately Pecola Leisure MD Pathologist, Electronic Signature ( Signed 02/15/2012) CHROMOGENIC IN-SITU HYBRIDIZATION Interpretation: HER2/NEU BY CISH - SHOWS AMPLIFICATION BY CISH ANALYSIS. THE RATIO OF HER2: CEP 17 SIGNALS WAS 2.55. 40 TUMOR CELLS WERE SCORED. OF NOTE, THE TUMOR APPEARS TO SHOW HETEROGENEITY IN REGARDS TO HER2 EXPRESSION. Reference range: Ratio: HER2:CEP17 < 1.8 gene amplification not observed Ratio: HER2:CEP 17 1.8-2.2 - equivocal result Ratio: HER2:CEP17 > 2.2 - gene amplification observed Pecola Leisure MD Pathologist, Electronic Signature ( Signed 02/15/2012) 1 of ADDITIONAL INFORMATION: PROGNOSTIC INDICATORS - ACIS Results IMMUNOHISTOCHEMICAL AND MORPHOMETRIC ANALYSIS BY THE AUTOMATED CELLULAR IMAGING SYSTEM (ACIS) Estrogen Receptor (Negative, <1%): 0%, NEGATIVE Progesterone Receptor (Negative, <1%): 0%, NEGATIVE COMMENT: The negative hormone receptor study(ies) in this case have an internal positive control. All controls stained appropriately Abigail Miyamoto MD Pathologist, Electronic Signature ( Signed 02/22/2012)  ASSESSMENT #1 Stage 1 HER-2 positive left breast cancer ER negative PR negative.  Patient is being considered for neoadjuvant chemotherapy consisting of Taxotere carboplatinum and herceptin as she does want breast conservation.  She understands the risks and benefits of this approach and the side effects of her chemotherapy and Herceptin.  #2 She understands she will receive neulasta the day after chemotherapy to keep her white count up.    #3 She does have  thrombocytopenia and we will monitor this  #4 Dehydration/AKI   PLAN:  #1 Ms. Petrucci is doing well today.  Her labs look good.  She will proceed with Herceptin today.  We will work on getting her evaluated in [place].    #2 I will see her back in 2 weeks for her next Herceptin, unless her care is transferred by that point in time.    All questions were answered. The patient knows to call the clinic with any problems, questions or concerns. We can certainly see the patient much sooner if necessary.  I spent 25 minutes counseling the patient face to face. The total time spent in  the appointment was 30 minutes.  Cherie Ouch Lyn Hollingshead, NP Medical Oncology [facility] Phone: [phone] 07/04/2012, 3:08 PM

## 2012-07-04 NOTE — Patient Instructions (Addendum)
Doing well.  Proceed with Herceptin.  Please call us if you have any questions or concerns.    

## 2012-07-04 NOTE — Patient Instructions (Addendum)
[facility] Discharge Instructions for Patients Receiving Chemotherapy  Today you received the following chemotherapy agents Herceptin.  To help prevent nausea and vomiting after your treatment, we encourage you to take your nausea medication as prescribed.   If you develop nausea and vomiting that is not controlled by your nausea medication, call the clinic. If it is after clinic hours your family physician or the after hours number for the clinic or go to the Emergency Department.   BELOW ARE SYMPTOMS THAT SHOULD BE REPORTED IMMEDIATELY:  *FEVER GREATER THAN 100.5 F  *CHILLS WITH OR WITHOUT FEVER  NAUSEA AND VOMITING THAT IS NOT CONTROLLED WITH YOUR NAUSEA MEDICATION  *UNUSUAL SHORTNESS OF BREATH  *UNUSUAL BRUISING OR BLEEDING  TENDERNESS IN MOUTH AND THROAT WITH OR WITHOUT PRESENCE OF ULCERS  *URINARY PROBLEMS  *BOWEL PROBLEMS  UNUSUAL RASH Items with * indicate a potential emergency and should be followed up as soon as possible.  Feel free to call the clinic you have any questions or concerns. The clinic phone number is [phone].   I have been informed and understand all the instructions given to me. I know to contact the clinic, my physician, or go to the Emergency Department if any problems should occur. I do not have any questions at this time, but understand that I may call the clinic during office hours   should I have any questions or need assistance in obtaining follow up care.    __________________________________________  _____________  __________ Signature of Patient or Authorized Representative            Date                   Time    __________________________________________ Nurse's Signature    

## 2012-07-09 ENCOUNTER — Ambulatory Visit (INDEPENDENT_AMBULATORY_CARE_PROVIDER_SITE_OTHER): Payer: BC Managed Care – PPO | Admitting: General Surgery

## 2012-07-09 ENCOUNTER — Encounter (INDEPENDENT_AMBULATORY_CARE_PROVIDER_SITE_OTHER): Payer: Self-pay | Admitting: General Surgery

## 2012-07-09 VITALS — BP 142/78 | HR 84 | Resp 18 | Ht 62.0 in | Wt 163.0 lb

## 2012-07-09 MED ORDER — CEPHALEXIN 500 MG PO CAPS: 500.0000 mg | ORAL_CAPSULE | Freq: Two times a day (BID) | ORAL | Status: DC

## 2012-07-09 NOTE — Progress Notes (Signed)
Subjective:     Patient ID: Maria Mckinney, female   DOB: 29-Aug-1950, 62 y.o.   MRN: [id number]  HPI This is a 62 year old female who underwent primary systemic therapy and had some significant side effects from this. I then took her to the operating room and did a bracketed excision of her left breast. Her pathology shows fibrocystic changes with calcifications. The pathology specimen included the 2 clips. This shows evidence of treatment effect including fibrosis, edema, and inflammation. 3 lymph nodes were negative and did not have tumor present. Postoperatively she has done well. She had a little bit of drainage on a piece of goals today. She reports no real pain and no other problems. She denies any fevers.  Review of Systems     Objective:   Physical Exam Left upper outer quadrant incision healing. There is some redness around this I think most likely is due to her tape. The Steri-Strips on there have removed basically her epidermis as well. Hopefully this will continue to heal.    Assessment:     Status post lumpectomy and sentinel lymph node biopsy     Plan:     She will need her appointments at the cancer center postoperatively. I discussed with her again the need for continuing her adjuvant therapy including radiation therapy. I did give her some antibiotics for some of this redness today although I think that this is most likely related to a tape reaction. I will plan on seeing her back next week to ensure that this continues to get better.

## 2012-07-11 ENCOUNTER — Telehealth (INDEPENDENT_AMBULATORY_CARE_PROVIDER_SITE_OTHER): Payer: Self-pay

## 2012-07-11 NOTE — Telephone Encounter (Signed)
LMOM giving pt the appt with Dr Dwain Sarna for 3/18 arrive at 9:45 for 10:00 pt will be a work in appt with Dr Dwain Sarna.

## 2012-07-17 ENCOUNTER — Encounter (INDEPENDENT_AMBULATORY_CARE_PROVIDER_SITE_OTHER): Payer: Self-pay | Admitting: General Surgery

## 2012-07-17 ENCOUNTER — Ambulatory Visit (INDEPENDENT_AMBULATORY_CARE_PROVIDER_SITE_OTHER): Payer: BC Managed Care – PPO | Admitting: General Surgery

## 2012-07-17 VITALS — BP 142/64 | HR 76 | Temp 97.2°F | Resp 16 | Ht 62.0 in | Wt 165.0 lb

## 2012-07-17 DIAGNOSIS — Z09 Encounter for follow-up examination after completed treatment for conditions other than malignant neoplasm: Secondary | ICD-10-CM

## 2012-07-17 NOTE — Progress Notes (Signed)
Subjective:     Patient ID: Maria Mckinney, female   DOB: 11-02-1950, 62 y.o.   MRN: [id number]  HPI This is a 62 year old female who underwent primary systemic therapy and had some significant side effects from this. I then took her to the operating room and did a bracketed excision of her left breast. Her pathology shows fibrocystic changes with calcifications. The pathology specimen included the 2 clips.  3 lymph nodes were negative and did not have tumor present. Postoperatively she has done well. She has had no further drainage. Her arm moves well without difficulty. She reports no real pain and no other problems. She denies any fevers.   Review of Systems     Objective:   Physical Exam Healing left breast incision without infection,    Assessment:     S/p lump/sn biopsy S/p primary chemotherapy     Plan:     She is healing well from surgery.  I gave her info for [facility] PT class to attend when able. I will let cancer center know she would like to stay under care of Dr. Welton Flakes but would like to get xrt in [place] for family reasons.  She will need to see them in near future.

## 2012-07-17 NOTE — Patient Instructions (Signed)
      [facility] CLASS After Breast Cancer Class  After Breast Cancer Class is a specially designed exercise class to assist you in a safe recovery after having breast cancer surgery.  In this class you will learn how to get back to full function whether your drains were just removed or if you had surgery a month ago.  This one-time class is held the 1st and 3rd Monday of every month from 11:00 a.m. until 12:00 noon at the [facility] located at [address]  This class is FREE and space is limited.  For more information or to register for the next available class, call [phone].  Class Goals   Understand specific stretches to improve the flexibility of your chest and shoulder.   Learn ways to safely strengthen your upper body and improve your posture.   Understand the warning signs of infection and why you may be at risk for an arm infection.   Learn about Lymphedema and prevention.  **You do not attend this class until after surgery.  Drains must be removed to participate.     Donna Salisbury, PT, CLT Marti Smith, PT, CLT        

## 2012-07-25 ENCOUNTER — Telehealth: Payer: Self-pay | Admitting: *Deleted

## 2012-07-25 ENCOUNTER — Other Ambulatory Visit (HOSPITAL_BASED_OUTPATIENT_CLINIC_OR_DEPARTMENT_OTHER): Payer: BC Managed Care – PPO | Admitting: Lab

## 2012-07-25 ENCOUNTER — Encounter: Payer: Self-pay | Admitting: Adult Health

## 2012-07-25 ENCOUNTER — Other Ambulatory Visit: Payer: Self-pay | Admitting: Oncology

## 2012-07-25 ENCOUNTER — Ambulatory Visit (HOSPITAL_BASED_OUTPATIENT_CLINIC_OR_DEPARTMENT_OTHER): Payer: BC Managed Care – PPO

## 2012-07-25 ENCOUNTER — Ambulatory Visit (HOSPITAL_BASED_OUTPATIENT_CLINIC_OR_DEPARTMENT_OTHER): Payer: BC Managed Care – PPO | Admitting: Adult Health

## 2012-07-25 VITALS — BP 144/77 | HR 79 | Temp 98.3°F | Resp 20 | Ht 62.0 in | Wt 165.9 lb

## 2012-07-25 DIAGNOSIS — C50412 Malignant neoplasm of upper-outer quadrant of left female breast: Secondary | ICD-10-CM

## 2012-07-25 DIAGNOSIS — Z171 Estrogen receptor negative status [ER-]: Secondary | ICD-10-CM

## 2012-07-25 DIAGNOSIS — Z5112 Encounter for antineoplastic immunotherapy: Secondary | ICD-10-CM

## 2012-07-25 DIAGNOSIS — C50419 Malignant neoplasm of upper-outer quadrant of unspecified female breast: Secondary | ICD-10-CM

## 2012-07-25 DIAGNOSIS — D696 Thrombocytopenia, unspecified: Secondary | ICD-10-CM

## 2012-07-25 DIAGNOSIS — Z79899 Other long term (current) drug therapy: Secondary | ICD-10-CM

## 2012-07-25 LAB — COMPREHENSIVE METABOLIC PANEL (CC13)
AST: 32 U/L (ref 5–34)
Albumin: 3.7 g/dL (ref 3.5–5.0)
Alkaline Phosphatase: 92 U/L (ref 40–150)
Potassium: 3.1 mEq/L — ABNORMAL LOW (ref 3.5–5.1)
Sodium: 140 mEq/L (ref 136–145)
Total Protein: 6.8 g/dL (ref 6.4–8.3)

## 2012-07-25 LAB — CBC WITH DIFFERENTIAL/PLATELET
BASO%: 0.6 % (ref 0.0–2.0)
EOS%: 4.1 % (ref 0.0–7.0)
MCH: 32.7 pg (ref 25.1–34.0)
MCHC: 34.2 g/dL (ref 31.5–36.0)
RBC: 3.24 10*6/uL — ABNORMAL LOW (ref 3.70–5.45)
RDW: 14.6 % — ABNORMAL HIGH (ref 11.2–14.5)
lymph#: 1.5 10*3/uL (ref 0.9–3.3)

## 2012-07-25 MED ORDER — ACETAMINOPHEN 325 MG PO TABS
650.0000 mg | ORAL_TABLET | Freq: Once | ORAL | Status: AC
  Administered 2012-07-25: 650 mg via ORAL

## 2012-07-25 MED ORDER — SODIUM CHLORIDE 0.9 % IV SOLN
Freq: Once | INTRAVENOUS | Status: AC
  Administered 2012-07-25: 12:00:00 via INTRAVENOUS

## 2012-07-25 MED ORDER — HEPARIN SOD (PORK) LOCK FLUSH 100 UNIT/ML IV SOLN
500.0000 [IU] | Freq: Once | INTRAVENOUS | Status: AC | PRN
  Administered 2012-07-25: 500 [IU]
  Filled 2012-07-25: qty 5

## 2012-07-25 MED ORDER — SODIUM CHLORIDE 0.9 % IJ SOLN
10.0000 mL | INTRAMUSCULAR | Status: DC | PRN
  Administered 2012-07-25: 10 mL
  Filled 2012-07-25: qty 10

## 2012-07-25 MED ORDER — TRASTUZUMAB CHEMO INJECTION 440 MG
6.0000 mg/kg | Freq: Once | INTRAVENOUS | Status: AC
  Administered 2012-07-25: 462 mg via INTRAVENOUS
  Filled 2012-07-25: qty 22

## 2012-07-25 NOTE — Progress Notes (Signed)
Pt refused benadryl 

## 2012-07-25 NOTE — Telephone Encounter (Signed)
appts made and printed 

## 2012-07-25 NOTE — Telephone Encounter (Signed)
Per staff phone call and POF I have schedueld appts.  JMW  

## 2012-07-25 NOTE — Patient Instructions (Addendum)
Doing well.  Proceed with Herceptin.  Please call us if you have any questions or concerns.    

## 2012-07-25 NOTE — Patient Instructions (Signed)
Cancer Center Discharge Instructions for Patients Receiving Chemotherapy  Today you received the following chemotherapy agents: herceptin  To help prevent nausea and vomiting after your treatment, we encourage you to take your nausea medication.  Take it as often as prescribed.     If you develop nausea and vomiting that is not controlled by your nausea medication, call the clinic. If it is after clinic hours your family physician or the after hours number for the clinic or go to the Emergency Department.   BELOW ARE SYMPTOMS THAT SHOULD BE REPORTED IMMEDIATELY:  *FEVER GREATER THAN 100.5 F  *CHILLS WITH OR WITHOUT FEVER  NAUSEA AND VOMITING THAT IS NOT CONTROLLED WITH YOUR NAUSEA MEDICATION  *UNUSUAL SHORTNESS OF BREATH  *UNUSUAL BRUISING OR BLEEDING  TENDERNESS IN MOUTH AND THROAT WITH OR WITHOUT PRESENCE OF ULCERS  *URINARY PROBLEMS  *BOWEL PROBLEMS  UNUSUAL RASH Items with * indicate a potential emergency and should be followed up as soon as possible.  Feel free to call the clinic you have any questions or concerns. The clinic phone number is [phone].   I have been informed and understand all the instructions given to me. I know to contact the clinic, my physician, or go to the Emergency Department if any problems should occur. I do not have any questions at this time, but understand that I may call the clinic during office hours   should I have any questions or need assistance in obtaining follow up care.    __________________________________________  _____________  __________ Signature of Patient or Authorized Representative            Date                   Time    __________________________________________ Nurse's Signature    

## 2012-07-25 NOTE — Progress Notes (Signed)
Maria Mckinney [id number] [birth year] 62 y.o. 07/25/2012 11:30 AM  CC  [name], MD Dr. [name] [address] [place] [address] Dr. [name] Dr. [name] Dr. 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  PRIOR THERAPY:  #1Patient was originally seen in the multidisciplinary breast clinic for new diagnosis of left breast cancer that was 8mm in the upper outer quadrant of the left breast.  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  There was an additional 4 cm area of normal signal.  Patient did not get an MRI secondary to cochlear implant.  She desires breast conservation.    #2 Patient is s/p port-a-cath placement for neoadjuvant chemotherapy consisting of Taxotere/carboplatinum/Herceptin.  She will begin cycle #1 on 02/29/2012.  She will receive Taxotere and carboplatinum every 21 days with Herceptin giving weekly.  She will receive a total of 4 cycles of Taxotere and Carboplatinum--which completed on 05/03/12.    #3 Herceptin every 3 weeks beginning 05/23/12  CURRENT THERAPY: Herceptin q3 weeks  INTERVAL HISTORY: Maria Mckinney is here for her q3 week herceptin.  She is doing well today.  She denies fevers, chills, nausea, vomiting or any other concerns.  Past Medical History: Past Medical History  Diagnosis Date  . HTN (hypertension)   . Fibromyalgia   . GERD (gastroesophageal reflux disease)   . Arthritis   . Anxiety   . Dysrhythmia     irreg hr  . Wears hearing aid     left ear  . 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 PONV (postoperative nausea and vomiting)     Past Surgical History: Past Surgical History  Procedure Laterality Date  . Appendectomy    . Combined hysteroscopy diagnostic / d&c    . Cochlear implant    . Portacath placement  02/22/2012     Procedure: INSERTION PORT-A-CATH;  Surgeon: [name], MD;  Location: [facility];  Service: General;  Laterality: Right;  . Breast lumpectomy with needle localization and axillary sentinel lymph node bx Left 06/27/2012    Procedure: BREAST LUMPECTOMY WITH NEEDLE LOCALIZATION AND AXILLARY SENTINEL LYMPH NODE BIOPSY;  Surgeon: [name], MD;  Location: [facility] OR;  Service: General;  Laterality: Left;  left breast wire localized at [facility] lumpectomy with left axillary sentinel node biopsy nuclear med 1400    Family History: Family History  Problem Relation Age of Onset  . Cancer Mother     "tumor of face removed"  . Cancer Father     kidney removed  . Cancer Maternal Aunt     stomach & Pancreatic  . Cancer Paternal Uncle     pancreatic    Social History History  Substance Use Topics  . Smoking status: Never Smoker   . Smokeless tobacco: Never Used  . Alcohol Use: 0.0 oz/week    0-1 Glasses of wine per week    Allergies: Allergies  Allergen Reactions  . Acyclovir And Related   . Ciprofloxacin Hcl Rash  . Bactrim (Sulfamethoxazole W-Trimethoprim)     Current Medications: Current Outpatient Prescriptions  Medication Sig Dispense Refill  . bisoprolol-hydrochlorothiazide (ZIAC) 5-6.25 MG per tablet Take 1 tablet by mouth daily.      . cyclobenzaprine (FLEXERIL) 10 MG tablet Take 10 mg by mouth daily as needed for muscle spasms. For muscle  pain.      Marland Kitchen DULoxetine (CYMBALTA) 60 MG capsule Take 60 mg by mouth daily.      Marland Kitchen esomeprazole (NEXIUM) 40 MG capsule Take 1 capsule (40 mg total) by mouth daily before breakfast.  30 capsule  6  . HYDROcodone-acetaminophen (NORCO/VICODIN) 5-325 MG per tablet Take 1 tablet by mouth every 6 (six) hours as needed for pain.      Marland Kitchen HYDROcodone-acetaminophen (NORCO/VICODIN) 5-325 MG per tablet Take 1-2 tablets by mouth every 4 (four) hours as needed.  30 tablet  0  . levothyroxine (SYNTHROID, LEVOTHROID) 100 MCG tablet  Take 100 mcg by mouth daily.      Marland Kitchen lidocaine-prilocaine (EMLA) cream Apply 1 application topically as needed. For port-a-cath access.      . meloxicam (MOBIC) 15 MG tablet Take 15 mg by mouth daily.      . mometasone (NASONEX) 50 MCG/ACT nasal spray Place 2 sprays into the nose daily.      Marland Kitchen PRESCRIPTION MEDICATION Inject into the vein. Herceptin infusion every 21 days      . pseudoephedrine (SUDAFED) 30 MG tablet Take 30 mg by mouth every 6 (six) hours as needed for congestion.        No current facility-administered medications for this visit.    Health Maintenance:  ColonoscopyShe has never had a colonoscopy Bone DensityBone density about 10-12 years ago Last PAP smearLast Pap smear was in 2012   REVIEW OF SYSTEMS: General: fatigue (-), night sweats (-), fever (-), pain (-) Lymph: palpable nodes (-) HEENT: vision changes (-), mucositis (-), gum bleeding (-), epistaxis (-) Cardiovascular: chest pain (-), palpitations (-) Pulmonary: shortness of breath (-), dyspnea on exertion (-), cough (-), hemoptysis (-) GI:  Early satiety (-), melena (-), dysphagia (-), nausea/vomiting (-), diarrhea (-) GU: dysuria (-), hematuria (-), incontinence (-) Musculoskeletal: joint swelling (-), joint pain (-), back pain (-) Neuro: weakness (-), numbness (-), headache (-), confusion (-) Skin: Rash (-), lesions (-), dryness (-) Psych: depression (-), suicidal/homicidal ideation (-), feeling of hopelessness (-)   PHYSICAL EXAMINATION: Blood pressure 144/77, pulse 79, temperature 98.3 F (36.8 C), temperature source Oral, resp. rate 20, height 5\' 2"  (1.575 m), weight 165 lb 14.4 oz (75.252 kg). General: Patient is a well appearing female in no acute distress HEENT: PERRLA, sclerae anicteric no conjunctival pallor, MMM,  Neck: supple, no palpable adenopathy Lungs: clear to auscultation bilaterally, no wheezes, rhonchi, or rales Cardiovascular: regular rate rhythm, S1, S2, no murmurs, rubs or gallops,   Abdomen: Soft, non-tender, non-distended, normoactive bowel sounds, no HSM Extremities: warm and well perfused, no clubbing, cyanosis, or edema Skin: No rashes or lesions Neuro: Non-focal ECOG PERFORMANCE STATUS: 1 - Symptomatic but completely ambulatory Breasts: left breast lumpectomy site healing well, clean dry and intact, no nodularity, right breast: no masses or nodularity  STUDIES/RESULTS: No results found.   LABS:    Chemistry      Component Value Date/Time   NA 141 07/04/2012 1302   NA 139 06/21/2012 1235   K 3.1* 07/04/2012 1302   K 4.1 06/21/2012 1235   CL 101 07/04/2012 1302   CL 100 06/21/2012 1235   CO2 29 07/04/2012 1302   CO2 27 06/21/2012 1235   BUN 10.9 07/04/2012 1302   BUN 14 06/21/2012 1235   CREATININE 1.0 07/04/2012 1302   CREATININE 1.24* 06/21/2012 1235      Component Value Date/Time   CALCIUM 7.9* 07/04/2012 1302   CALCIUM 7.4* 06/21/2012 1235   ALKPHOS 92  07/04/2012 1302   ALKPHOS 59 05/10/2012 1909   AST 33 07/04/2012 1302   AST 30 05/10/2012 1909   ALT 22 07/04/2012 1302   ALT 69* 05/10/2012 1909   BILITOT 0.54 07/04/2012 1302   BILITOT 3.9* 05/10/2012 1909      Lab Results  Component Value Date   WBC 3.4* 07/25/2012   HGB 10.6* 07/25/2012   HCT 31.0* 07/25/2012   MCV 95.7 07/25/2012   PLT 102* 07/25/2012      PATHOLOGY:  ADDITIONAL INFORMATION: PROGNOSTIC INDICATORS - ACIS Results IMMUNOHISTOCHEMICAL AND MORPHOMETRIC ANALYSIS BY THE AUTOMATED CELLULAR IMAGING SYSTEM (ACIS) Estrogen Receptor (Negative, <1%): 0%, NEGATIVE Progesterone Receptor (Negative, <1%): 0%, NEGATIVE Proliferation Marker Ki67 by M IB-1 (Low<20%): 98% All controls stained appropriately Pecola Leisure MD Pathologist, Electronic Signature ( Signed 02/15/2012) CHROMOGENIC IN-SITU HYBRIDIZATION Interpretation: HER2/NEU BY CISH - SHOWS AMPLIFICATION BY CISH ANALYSIS. THE RATIO OF HER2: CEP 17 SIGNALS WAS 2.55. 40 TUMOR CELLS WERE SCORED. OF NOTE, THE TUMOR APPEARS TO SHOW HETEROGENEITY IN  REGARDS TO HER2 EXPRESSION. Reference range: Ratio: HER2:CEP17 < 1.8 gene amplification not observed Ratio: HER2:CEP 17 1.8-2.2 - equivocal result Ratio: HER2:CEP17 > 2.2 - gene amplification observed Pecola Leisure MD Pathologist, Electronic Signature ( Signed 02/15/2012) 1 of ADDITIONAL INFORMATION: PROGNOSTIC INDICATORS - ACIS Results IMMUNOHISTOCHEMICAL AND MORPHOMETRIC ANALYSIS BY THE AUTOMATED CELLULAR IMAGING SYSTEM (ACIS) Estrogen Receptor (Negative, <1%): 0%, NEGATIVE Progesterone Receptor (Negative, <1%): 0%, NEGATIVE COMMENT: The negative hormone receptor study(ies) in this case have an internal positive control. All controls stained appropriately Abigail Miyamoto MD Pathologist, Electronic Signature ( Signed 02/22/2012)  ASSESSMENT #1 Stage 1 HER-2 positive left breast cancer ER negative PR negative.  Patient is being considered for neoadjuvant chemotherapy consisting of Taxotere carboplatinum and herceptin as she does want breast conservation.  She understands the risks and benefits of this approach and the side effects of her chemotherapy and Herceptin.  #2 She understands she will receive neulasta the day after chemotherapy to keep her white count up.    #3 She does have thrombocytopenia and we will monitor this   PLAN:  #1 Maria Mckinney is doing well today.  Her labs look good.  She will proceed with Herceptin today. I will review with her having a radiation oncology consult in [place].    #2 I will see her back in 3 weeks for her next herceptin.    All questions were answered. The patient knows to call the clinic with any problems, questions or concerns. We can certainly see the patient much sooner if necessary.  I spent 25 minutes counseling the patient face to face. The total time spent in the appointment was 30 minutes.  Cherie Ouch Lyn Hollingshead, NP [facility] Phone: [phone] 07/25/2012, 11:30 AM

## 2012-07-26 ENCOUNTER — Other Ambulatory Visit: Payer: Self-pay | Admitting: Medical Oncology

## 2012-07-26 MED ORDER — POTASSIUM CHLORIDE CRYS ER 20 MEQ PO TBCR: 20.0000 meq | EXTENDED_RELEASE_TABLET | Freq: Every day | ORAL | Status: DC

## 2012-07-26 NOTE — Telephone Encounter (Signed)
Patient called for results to her labs from 03/26, per Augustin Schooling, NP informed patient that her potassium level @ 3.1 and that a prescription for Kdur 20 meq to be taken by mouth 1 (once) per day for 7 days, sent to her listed pharmacy. Patient verbalized understanding. Patient also informed that she will be contacted regarding appt for radiation oncology consult in [place]. No further questions at this time. Patient knows to call office with any questions or concerns.

## 2012-07-27 ENCOUNTER — Telehealth: Payer: Self-pay | Admitting: Oncology

## 2012-07-27 NOTE — Telephone Encounter (Signed)
Pt to see Dr. Clovis Riley at [facility] 08/02/12 @ 9:45.  Pt aware by vm

## 2012-08-06 ENCOUNTER — Encounter: Payer: Self-pay | Admitting: Oncology

## 2012-08-06 ENCOUNTER — Other Ambulatory Visit: Payer: Self-pay | Admitting: Adult Health

## 2012-08-13 ENCOUNTER — Encounter: Payer: Self-pay | Admitting: *Deleted

## 2012-08-13 NOTE — Progress Notes (Signed)
RECEIVED A FAX FROM [facility] CONCERNING A PRIOR AUTHORIZATION FOR NEXIUM. THIS REQUEST WAS PLACED IN THE MANAGED CARE BIN.

## 2012-08-14 ENCOUNTER — Encounter: Payer: Self-pay | Admitting: Oncology

## 2012-08-14 NOTE — Progress Notes (Signed)
[facility], [id number], approved nexium 40mg  from 07/24/12-08/14/13.

## 2012-08-16 ENCOUNTER — Ambulatory Visit (HOSPITAL_BASED_OUTPATIENT_CLINIC_OR_DEPARTMENT_OTHER): Payer: BC Managed Care – PPO | Admitting: Adult Health

## 2012-08-16 ENCOUNTER — Other Ambulatory Visit (HOSPITAL_BASED_OUTPATIENT_CLINIC_OR_DEPARTMENT_OTHER): Payer: BC Managed Care – PPO | Admitting: Lab

## 2012-08-16 ENCOUNTER — Encounter: Payer: Self-pay | Admitting: Adult Health

## 2012-08-16 ENCOUNTER — Ambulatory Visit (HOSPITAL_BASED_OUTPATIENT_CLINIC_OR_DEPARTMENT_OTHER): Payer: BC Managed Care – PPO

## 2012-08-16 VITALS — BP 104/60 | HR 74 | Temp 98.4°F | Resp 20 | Ht 62.0 in | Wt 167.6 lb

## 2012-08-16 DIAGNOSIS — Z5112 Encounter for antineoplastic immunotherapy: Secondary | ICD-10-CM

## 2012-08-16 DIAGNOSIS — Z171 Estrogen receptor negative status [ER-]: Secondary | ICD-10-CM

## 2012-08-16 DIAGNOSIS — C50412 Malignant neoplasm of upper-outer quadrant of left female breast: Secondary | ICD-10-CM

## 2012-08-16 DIAGNOSIS — C50419 Malignant neoplasm of upper-outer quadrant of unspecified female breast: Secondary | ICD-10-CM

## 2012-08-16 DIAGNOSIS — F329 Major depressive disorder, single episode, unspecified: Secondary | ICD-10-CM

## 2012-08-16 DIAGNOSIS — D696 Thrombocytopenia, unspecified: Secondary | ICD-10-CM

## 2012-08-16 LAB — COMPREHENSIVE METABOLIC PANEL (CC13)
AST: 28 U/L (ref 5–34)
Albumin: 3.8 g/dL (ref 3.5–5.0)
Alkaline Phosphatase: 95 U/L (ref 40–150)
BUN: 18.4 mg/dL (ref 7.0–26.0)
Creatinine: 1.3 mg/dL — ABNORMAL HIGH (ref 0.6–1.1)
Potassium: 3.3 mEq/L — ABNORMAL LOW (ref 3.5–5.1)
Total Bilirubin: 0.33 mg/dL (ref 0.20–1.20)

## 2012-08-16 LAB — CBC WITH DIFFERENTIAL/PLATELET
Basophils Absolute: 0 10*3/uL (ref 0.0–0.1)
EOS%: 2.2 % (ref 0.0–7.0)
HGB: 10.4 g/dL — ABNORMAL LOW (ref 11.6–15.9)
LYMPH%: 47.2 % (ref 14.0–49.7)
MCH: 32.7 pg (ref 25.1–34.0)
MCV: 94 fL (ref 79.5–101.0)
MONO%: 7.5 % (ref 0.0–14.0)
RBC: 3.18 10*6/uL — ABNORMAL LOW (ref 3.70–5.45)
RDW: 13.2 % (ref 11.2–14.5)

## 2012-08-16 MED ORDER — SODIUM CHLORIDE 0.9 % IJ SOLN
10.0000 mL | INTRAMUSCULAR | Status: DC | PRN
  Administered 2012-08-16: 10 mL
  Filled 2012-08-16: qty 10

## 2012-08-16 MED ORDER — DULOXETINE HCL 60 MG PO CPEP: 60.0000 mg | ORAL_CAPSULE | Freq: Every day | ORAL | Status: AC

## 2012-08-16 MED ORDER — DIPHENHYDRAMINE HCL 25 MG PO CAPS: 50.0000 mg | ORAL_CAPSULE | Freq: Once | ORAL | Status: DC

## 2012-08-16 MED ORDER — SODIUM CHLORIDE 0.9 % IV SOLN
Freq: Once | INTRAVENOUS | Status: AC
  Administered 2012-08-16: 11:00:00 via INTRAVENOUS

## 2012-08-16 MED ORDER — ACETAMINOPHEN 325 MG PO TABS
650.0000 mg | ORAL_TABLET | Freq: Once | ORAL | Status: AC
  Administered 2012-08-16: 650 mg via ORAL

## 2012-08-16 MED ORDER — TRASTUZUMAB CHEMO INJECTION 440 MG
6.0000 mg/kg | Freq: Once | INTRAVENOUS | Status: AC
  Administered 2012-08-16: 462 mg via INTRAVENOUS
  Filled 2012-08-16: qty 22

## 2012-08-16 MED ORDER — HEPARIN SOD (PORK) LOCK FLUSH 100 UNIT/ML IV SOLN
500.0000 [IU] | Freq: Once | INTRAVENOUS | Status: AC | PRN
  Administered 2012-08-16: 500 [IU]
  Filled 2012-08-16: qty 5

## 2012-08-16 NOTE — Progress Notes (Signed)
Maria Mckinney [id number] December 26, 1950 61 y.o. 08/16/2012 10:21 AM  CC  Maria Ravel, MD Dr. Burnell Mckinney [address] [place] [address] Dr. Emelia Mckinney Dr. Lurline Mckinney Dr. 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  PRIOR THERAPY:  #1Patient was originally seen in the multidisciplinary breast clinic for new diagnosis of left breast cancer that was 8mm in the upper outer quadrant of the left breast.  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  There was an additional 4 cm area of normal signal.  Patient did not get an MRI secondary to cochlear implant.  She desires breast conservation.    #2 Patient is s/p port-a-cath placement for neoadjuvant chemotherapy consisting of Taxotere/carboplatinum/Herceptin.  She will begin cycle #1 on 02/29/2012.  She will receive Taxotere and carboplatinum every 21 days with Herceptin giving weekly.  She will receive a total of 4 cycles of Taxotere and Carboplatinum--which completed on 05/03/12.    #3 Herceptin every 3 weeks beginning 05/23/12  #4 Radiation therapy beginning on 08/14/12.  CURRENT THERAPY: Herceptin q3 weeks with concurrent radiation.    INTERVAL HISTORY: Maria Mckinney is here for her q3 week herceptin.  She is doing well today.  She is undergoing radiation therapy as well in [place] that she started this past Tuesday and is thus far tolerating it well.  She denies fevers, chills, nausea, vomiting, shortness of breath, skin changes, constipation, diarrhea, or any further concerns.    Past Medical History: Past Medical History  Diagnosis Date  . HTN (hypertension)   . Fibromyalgia   . GERD (gastroesophageal reflux disease)   . Arthritis   . Anxiety   . Dysrhythmia     irreg hr  . Wears hearing aid     left ear  . 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 PONV (postoperative nausea and vomiting)     Past Surgical History: Past Surgical History  Procedure Laterality Date  . Appendectomy    . Combined hysteroscopy diagnostic / d&c    . Cochlear implant    . Portacath placement  02/22/2012    Procedure: INSERTION PORT-A-CATH;  Surgeon: Maria Loron, MD;  Location: [facility];  Service: General;  Laterality: Right;  . Breast lumpectomy with needle localization and axillary sentinel lymph node bx Left 06/27/2012    Procedure: BREAST LUMPECTOMY WITH NEEDLE LOCALIZATION AND AXILLARY SENTINEL LYMPH NODE BIOPSY;  Surgeon: Maria Loron, MD;  Location: [facility] OR;  Service: General;  Laterality: Left;  left breast wire localized at [facility] lumpectomy with left axillary sentinel node biopsy nuclear med 1400    Family History: Family History  Problem Relation Age of Onset  . Cancer Mother     "tumor of face removed"  . Cancer Father     kidney removed  . Cancer Maternal Aunt     stomach & Pancreatic  . Cancer Paternal Uncle     pancreatic    Social History History  Substance Use Topics  . Smoking status: Never Smoker   . Smokeless tobacco: Never Used  . Alcohol Use: 0.0 oz/week    0-1 Glasses of wine per week    Allergies: Allergies  Allergen Reactions  . Acyclovir And Related   . Ciprofloxacin Hcl Rash  . Bactrim (Sulfamethoxazole W-Trimethoprim)     Current Medications: Current Outpatient  Prescriptions  Medication Sig Dispense Refill  . bisoprolol-hydrochlorothiazide (ZIAC) 5-6.25 MG per tablet Take 1 tablet by mouth daily.      . cyclobenzaprine (FLEXERIL) 10 MG tablet Take 10 mg by mouth daily as needed for muscle spasms. For muscle pain.      . DULoxetine (CYMBALTA) 60 MG capsule Take 60 mg by mouth daily.      Marland Kitchen esomeprazole (NEXIUM) 40 MG capsule Take 1 capsule (40 mg total) by mouth daily before breakfast.  30 capsule  6  . HYDROcodone-acetaminophen (NORCO/VICODIN) 5-325 MG per  tablet Take 1 tablet by mouth every 6 (six) hours as needed for pain.      Marland Kitchen levothyroxine (SYNTHROID, LEVOTHROID) 100 MCG tablet Take 100 mcg by mouth daily.      Marland Kitchen lidocaine-prilocaine (EMLA) cream Apply 1 application topically as needed. For port-a-cath access.      . meloxicam (MOBIC) 15 MG tablet Take 15 mg by mouth daily.      . mometasone (NASONEX) 50 MCG/ACT nasal spray Place 2 sprays into the nose daily.      Marland Kitchen PRESCRIPTION MEDICATION Inject into the vein. Herceptin infusion every 21 days      . pseudoephedrine (SUDAFED) 30 MG tablet Take 30 mg by mouth every 6 (six) hours as needed for congestion.        No current facility-administered medications for this visit.    Health Maintenance:  ColonoscopyShe has never had a colonoscopy Bone DensityBone density about 10-12 years ago Last PAP smearLast Pap smear was in 2012   REVIEW OF SYSTEMS: General: fatigue (-), night sweats (-), fever (-), pain (-) Lymph: palpable nodes (-) HEENT: vision changes (-), mucositis (-), gum bleeding (-), epistaxis (-) Cardiovascular: chest pain (-), palpitations (-) Pulmonary: shortness of breath (-), dyspnea on exertion (-), cough (-), hemoptysis (-) GI:  Early satiety (-), melena (-), dysphagia (-), nausea/vomiting (-), diarrhea (-) GU: dysuria (-), hematuria (-), incontinence (-) Musculoskeletal: joint swelling (-), joint pain (-), back pain (-) Neuro: weakness (-), numbness (-), headache (-), confusion (-) Skin: Rash (-), lesions (-), dryness (-) Psych: depression (-), suicidal/homicidal ideation (-), feeling of hopelessness (-)   PHYSICAL EXAMINATION: Blood pressure 104/60, pulse 74, temperature 98.4 F (36.9 C), temperature source Oral, resp. rate 20, height 5\' 2"  (1.575 m), weight 167 lb 9.6 oz (76.023 kg). General: Patient is a well appearing female in no acute distress HEENT: PERRLA, sclerae anicteric no conjunctival pallor, MMM,  Neck: supple, no palpable adenopathy Lungs: clear to  auscultation bilaterally, no wheezes, rhonchi, or rales Cardiovascular: regular rate rhythm, S1, S2, no murmurs, rubs or gallops,  Abdomen: Soft, non-tender, non-distended, normoactive bowel sounds, no HSM Extremities: warm and well perfused, no clubbing, cyanosis, or edema Skin: No rashes or lesions Neuro: Non-focal ECOG PERFORMANCE STATUS: 1 - Symptomatic but completely ambulatory Breasts: left breast lumpectomy site healing well, clean dry and intact, no nodularity, right breast: no masses or nodularity  STUDIES/RESULTS: No results found.   LABS:    Chemistry      Component Value Date/Time   NA 140 07/25/2012 1019   NA 139 06/21/2012 1235   K 3.1* 07/25/2012 1019   K 4.1 06/21/2012 1235   CL 101 07/25/2012 1019   CL 100 06/21/2012 1235   CO2 29 07/25/2012 1019   CO2 27 06/21/2012 1235   BUN 10.9 07/25/2012 1019   BUN 14 06/21/2012 1235   CREATININE 1.1 07/25/2012 1019   CREATININE 1.24* 06/21/2012 1235  Component Value Date/Time   CALCIUM 9.0 07/25/2012 1019   CALCIUM 7.4* 06/21/2012 1235   ALKPHOS 92 07/25/2012 1019   ALKPHOS 59 05/10/2012 1909   AST 32 07/25/2012 1019   AST 30 05/10/2012 1909   ALT 24 07/25/2012 1019   ALT 69* 05/10/2012 1909   BILITOT 0.43 07/25/2012 1019   BILITOT 3.9* 05/10/2012 1909      Lab Results  Component Value Date   WBC 3.6* 08/16/2012   HGB 10.4* 08/16/2012   HCT 29.9* 08/16/2012   MCV 94.0 08/16/2012   PLT 100* 08/16/2012      PATHOLOGY:  ADDITIONAL INFORMATION: PROGNOSTIC INDICATORS - ACIS Results IMMUNOHISTOCHEMICAL AND MORPHOMETRIC ANALYSIS BY THE AUTOMATED CELLULAR IMAGING SYSTEM (ACIS) Estrogen Receptor (Negative, <1%): 0%, NEGATIVE Progesterone Receptor (Negative, <1%): 0%, NEGATIVE Proliferation Marker Ki67 by M IB-1 (Low<20%): 98% All controls stained appropriately Pecola Leisure MD Pathologist, Electronic Signature ( Signed 02/15/2012) CHROMOGENIC IN-SITU HYBRIDIZATION Interpretation: HER2/NEU BY CISH - SHOWS AMPLIFICATION BY CISH  ANALYSIS. THE RATIO OF HER2: CEP 17 SIGNALS WAS 2.55. 40 TUMOR CELLS WERE SCORED. OF NOTE, THE TUMOR APPEARS TO SHOW HETEROGENEITY IN REGARDS TO HER2 EXPRESSION. Reference range: Ratio: HER2:CEP17 < 1.8 gene amplification not observed Ratio: HER2:CEP 17 1.8-2.2 - equivocal result Ratio: HER2:CEP17 > 2.2 - gene amplification observed Pecola Leisure MD Pathologist, Electronic Signature ( Signed 02/15/2012) 1 of ADDITIONAL INFORMATION: PROGNOSTIC INDICATORS - ACIS Results IMMUNOHISTOCHEMICAL AND MORPHOMETRIC ANALYSIS BY THE AUTOMATED CELLULAR IMAGING SYSTEM (ACIS) Estrogen Receptor (Negative, <1%): 0%, NEGATIVE Progesterone Receptor (Negative, <1%): 0%, NEGATIVE COMMENT: The negative hormone receptor study(ies) in this case have an internal positive control. All controls stained appropriately Abigail Miyamoto MD Pathologist, Electronic Signature ( Signed 02/22/2012)  ASSESSMENT #1 Stage 1 HER-2 positive left breast cancer ER negative PR negative.  Patient is being considered for neoadjuvant chemotherapy consisting of Taxotere carboplatinum and herceptin as she does want breast conservation.  She understands the risks and benefits of this approach and the side effects of her chemotherapy and Herceptin.  #2 She understands she will receive neulasta the day after chemotherapy to keep her white count up.    #3 She does have thrombocytopenia and we will monitor this   PLAN:  #1 Maria. Livingston is doing well today.  Her labs look good.  She will proceed with Herceptin today.  She is doing well with radiation therapy.  I refilled her Cymbalta, and requested brand name only at her request.   #2 I will see her back in 3 weeks for her next herceptin.    All questions were answered. The patient knows to call the clinic with any problems, questions or concerns. We can certainly see the patient much sooner if necessary.  I spent 25 minutes counseling the patient face to face. The total time spent in  the appointment was 30 minutes.  Cherie Ouch Lyn Hollingshead, NP Medical Oncology [facility] Phone: [phone] 08/16/2012, 10:21 AM

## 2012-08-16 NOTE — Patient Instructions (Signed)
Doing well.  Proceed with Herceptin.  Please call us if you have any questions or concerns.    

## 2012-08-16 NOTE — Patient Instructions (Addendum)
[facility] Discharge Instructions for Patients Receiving Chemotherapy  Today you received the following chemotherapy agents: herceptin  To help prevent nausea and vomiting after your treatment, we encourage you to take your nausea medication.  Take it as often as prescribed.     If you develop nausea and vomiting that is not controlled by your nausea medication, call the clinic. If it is after clinic hours your family physician or the after hours number for the clinic or go to the Emergency Department.   BELOW ARE SYMPTOMS THAT SHOULD BE REPORTED IMMEDIATELY:  *FEVER GREATER THAN 100.5 F  *CHILLS WITH OR WITHOUT FEVER  NAUSEA AND VOMITING THAT IS NOT CONTROLLED WITH YOUR NAUSEA MEDICATION  *UNUSUAL SHORTNESS OF BREATH  *UNUSUAL BRUISING OR BLEEDING  TENDERNESS IN MOUTH AND THROAT WITH OR WITHOUT PRESENCE OF ULCERS  *URINARY PROBLEMS  *BOWEL PROBLEMS  UNUSUAL RASH Items with * indicate a potential emergency and should be followed up as soon as possible.  Feel free to call the clinic you have any questions or concerns. The clinic phone number is [phone].   I have been informed and understand all the instructions given to me. I know to contact the clinic, my physician, or go to the Emergency Department if any problems should occur. I do not have any questions at this time, but understand that I may call the clinic during office hours   should I have any questions or need assistance in obtaining follow up care.    __________________________________________  _____________  __________ Signature of Patient or Authorized Representative            Date                   Time    __________________________________________ Nurse's Signature    

## 2012-08-17 ENCOUNTER — Telehealth: Payer: Self-pay

## 2012-08-17 NOTE — Telephone Encounter (Signed)
Spoke with pt's husband regarding pt's K level. Advised him that it was low and per [place], pt needs to start taking K Dur 20 mEq daily x 14 days. Spouse voiced understanding. Verified pharmacy choice and called in K Dur 20 mEq po daily x 14 days with 6 refills to [facility] in [place]. TMB

## 2012-08-30 ENCOUNTER — Ambulatory Visit (HOSPITAL_BASED_OUTPATIENT_CLINIC_OR_DEPARTMENT_OTHER)
Admission: RE | Admit: 2012-08-30 | Discharge: 2012-08-30 | Disposition: A | Discharge status: Home / Self Care | Payer: BC Managed Care – PPO | Source: Ambulatory Visit | Attending: Internal Medicine | Admitting: Internal Medicine

## 2012-08-30 ENCOUNTER — Ambulatory Visit (HOSPITAL_COMMUNITY)
Admission: RE | Admit: 2012-08-30 | Discharge: 2012-08-30 | Disposition: A | Discharge status: Home / Self Care | Payer: BC Managed Care – PPO | Source: Ambulatory Visit | Attending: Family Medicine | Admitting: Family Medicine

## 2012-08-30 VITALS — BP 134/68 | HR 81 | Wt 167.0 lb

## 2012-08-30 DIAGNOSIS — C50419 Malignant neoplasm of upper-outer quadrant of unspecified female breast: Secondary | ICD-10-CM

## 2012-08-30 DIAGNOSIS — Z09 Encounter for follow-up examination after completed treatment for conditions other than malignant neoplasm: Secondary | ICD-10-CM

## 2012-08-30 DIAGNOSIS — Z79899 Other long term (current) drug therapy: Secondary | ICD-10-CM | POA: Insufficient documentation

## 2012-08-30 DIAGNOSIS — C50919 Malignant neoplasm of unspecified site of unspecified female breast: Secondary | ICD-10-CM | POA: Insufficient documentation

## 2012-08-30 NOTE — Progress Notes (Signed)
Patient ID: Maria Mckinney, female   DOB: 01-05-1951, 62 y.o.   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 She will receive Taxotere and carboplatinum every 21 days with Herceptin weekly. She will receive a total of 4 cycles of Taxotere and Carboplatinum. Plans to continue Herceptin until November 2014.   02/21/12 ECHO EF 60-65% Lateral S' 7.1 05/30/12 ECHO 55-60% Lateral S' 7.2 08/30/12 ECHO 55-60% lateral s' 7.3 (hard to see)  Doing from a cardiac perspective. Tolerating herceptin well. No orthopnea, PND or edema.    Past Medical History  Diagnosis Date  . HTN (hypertension)   . Fibromyalgia   . GERD (gastroesophageal reflux disease)   . Arthritis   . Anxiety   . Dysrhythmia     irreg hr  . Wears hearing aid     left ear  . 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 PONV (postoperative nausea and vomiting)     Current Outpatient Prescriptions  Medication Sig Dispense Refill  . bisoprolol-hydrochlorothiazide (ZIAC) 5-6.25 MG per tablet Take 1 tablet by mouth daily.      . cyclobenzaprine (FLEXERIL) 10 MG tablet Take 10 mg by mouth daily as needed for muscle spasms. For muscle pain.      . DULoxetine (CYMBALTA) 60 MG capsule Take 1 capsule (60 mg total) by mouth daily.  30 capsule  3  . esomeprazole (NEXIUM) 40 MG capsule Take 1 capsule (40 mg total) by mouth daily before breakfast.  30 capsule  6  . HYDROcodone-acetaminophen (NORCO/VICODIN) 5-325 MG per tablet Take 1 tablet by mouth every 6 (six) hours as needed for pain.      Marland Kitchen levothyroxine (SYNTHROID, LEVOTHROID) 100 MCG tablet Take 100 mcg by mouth daily.      Marland Kitchen  lidocaine-prilocaine (EMLA) cream Apply 1 application topically as needed. For port-a-cath access.      . meloxicam (MOBIC) 15 MG tablet Take 15 mg by mouth daily.      . mometasone (NASONEX) 50 MCG/ACT nasal spray Place 2 sprays into the nose daily.      Marland Kitchen PRESCRIPTION MEDICATION Inject into the vein. Herceptin infusion every 21 days      . pseudoephedrine (SUDAFED) 30 MG tablet Take 30 mg by mouth every 6 (six) hours as needed for congestion.        No current facility-administered medications for this encounter.      PHYSICAL EXAM: Filed Vitals:   08/30/12 1158  BP: 134/68  Pulse: 81   General:  Well appearing. No respiratory difficulty HEENT: normal Neck: supple. no JVD. Carotids 2+ bilat; no bruits. No lymphadenopathy or thryomegaly appreciated. Cor: PMI nondisplaced. Regular rate & rhythm. No rubs, gallops or murmurs. Lungs: clear Abdomen: soft, nontender, nondistended. No hepatosplenomegaly. No bruits or masses. Good bowel sounds. Extremities: no cyanosis, clubbing, rash, edema Neuro: alert & oriented x 3, cranial nerves grossly intact. moves all 4 extremities w/o difficulty. Affect pleasant.   No results found for this or any previous visit (from the past 24 hour(s)). No results found.   ASSESSMENT & PLAN:

## 2012-08-30 NOTE — Addendum Note (Signed)
Encounter addended by: Theresia Bough, CMA on: 08/30/2012 12:16 PM<BR>     Documentation filed: Patient Instructions Section, Visit Diagnoses, Orders

## 2012-08-30 NOTE — Progress Notes (Signed)
  Echocardiogram 2D Echocardiogram has been performed.  Maria Mckinney 08/30/2012, 11:14 AM

## 2012-08-30 NOTE — Patient Instructions (Addendum)
Your physician has requested that you have an echocardiogram. Echocardiography is a painless test that uses sound waves to create images of your heart. It provides your doctor with information about the size and shape of your heart and how well your heart's chambers and valves are working. This procedure takes approximately one hour. There are no restrictions for this procedure.  Your physician recommends that you schedule a follow-up appointment in: 3 months  

## 2012-08-30 NOTE — Assessment & Plan Note (Signed)
I reviewed echos personally. EF and Doppler parameters stable. No HF on exam. Continue Herceptin.   

## 2012-09-06 ENCOUNTER — Ambulatory Visit (HOSPITAL_BASED_OUTPATIENT_CLINIC_OR_DEPARTMENT_OTHER): Payer: BC Managed Care – PPO | Admitting: Adult Health

## 2012-09-06 ENCOUNTER — Other Ambulatory Visit (HOSPITAL_BASED_OUTPATIENT_CLINIC_OR_DEPARTMENT_OTHER): Payer: BC Managed Care – PPO | Admitting: Lab

## 2012-09-06 ENCOUNTER — Ambulatory Visit (HOSPITAL_BASED_OUTPATIENT_CLINIC_OR_DEPARTMENT_OTHER): Payer: BC Managed Care – PPO

## 2012-09-06 ENCOUNTER — Encounter: Payer: Self-pay | Admitting: Adult Health

## 2012-09-06 VITALS — BP 110/66 | HR 79 | Temp 98.3°F | Resp 20 | Ht 62.0 in | Wt 167.0 lb

## 2012-09-06 DIAGNOSIS — Z5112 Encounter for antineoplastic immunotherapy: Secondary | ICD-10-CM

## 2012-09-06 DIAGNOSIS — Z170 Estrogen receptor positive status [ER+]: Secondary | ICD-10-CM

## 2012-09-06 DIAGNOSIS — B029 Zoster without complications: Secondary | ICD-10-CM

## 2012-09-06 DIAGNOSIS — C50419 Malignant neoplasm of upper-outer quadrant of unspecified female breast: Secondary | ICD-10-CM

## 2012-09-06 DIAGNOSIS — D696 Thrombocytopenia, unspecified: Secondary | ICD-10-CM

## 2012-09-06 DIAGNOSIS — D539 Nutritional anemia, unspecified: Secondary | ICD-10-CM

## 2012-09-06 DIAGNOSIS — L989 Disorder of the skin and subcutaneous tissue, unspecified: Secondary | ICD-10-CM

## 2012-09-06 LAB — COMPREHENSIVE METABOLIC PANEL (CC13)
ALT: 18 U/L (ref 0–55)
AST: 23 U/L (ref 5–34)
Alkaline Phosphatase: 97 U/L (ref 40–150)
Sodium: 140 mEq/L (ref 136–145)
Total Bilirubin: 0.4 mg/dL (ref 0.20–1.20)
Total Protein: 6.5 g/dL (ref 6.4–8.3)

## 2012-09-06 LAB — CBC WITH DIFFERENTIAL/PLATELET
BASO%: 0.6 % (ref 0.0–2.0)
EOS%: 3.8 % (ref 0.0–7.0)
HCT: 34.4 % — ABNORMAL LOW (ref 34.8–46.6)
LYMPH%: 35.9 % (ref 14.0–49.7)
MCH: 33.1 pg (ref 25.1–34.0)
MCHC: 30.5 g/dL — ABNORMAL LOW (ref 31.5–36.0)
NEUT%: 49.1 % (ref 38.4–76.8)
Platelets: 88 10*3/uL — ABNORMAL LOW (ref 145–400)
lymph#: 1.2 10*3/uL (ref 0.9–3.3)

## 2012-09-06 MED ORDER — TRASTUZUMAB CHEMO INJECTION 440 MG
6.0000 mg/kg | Freq: Once | INTRAVENOUS | Status: AC
  Administered 2012-09-06: 462 mg via INTRAVENOUS
  Filled 2012-09-06: qty 22

## 2012-09-06 MED ORDER — SODIUM CHLORIDE 0.9 % IV SOLN
Freq: Once | INTRAVENOUS | Status: AC
  Administered 2012-09-06: 13:00:00 via INTRAVENOUS

## 2012-09-06 MED ORDER — HEPARIN SOD (PORK) LOCK FLUSH 100 UNIT/ML IV SOLN
500.0000 [IU] | Freq: Once | INTRAVENOUS | Status: AC | PRN
  Administered 2012-09-06: 500 [IU]
  Filled 2012-09-06: qty 5

## 2012-09-06 MED ORDER — ACETAMINOPHEN 325 MG PO TABS
650.0000 mg | ORAL_TABLET | Freq: Once | ORAL | Status: AC
  Administered 2012-09-06: 650 mg via ORAL

## 2012-09-06 MED ORDER — ACYCLOVIR 800 MG PO TABS: 800.0000 mg | ORAL_TABLET | Freq: Four times a day (QID) | ORAL | Status: DC

## 2012-09-06 MED ORDER — SODIUM CHLORIDE 0.9 % IJ SOLN
10.0000 mL | INTRAMUSCULAR | Status: DC | PRN
  Administered 2012-09-06: 10 mL
  Filled 2012-09-06: qty 10

## 2012-09-06 NOTE — Patient Instructions (Signed)
Doing well.  Proceed with Herceptin.  Please call us if you have any questions or concerns.    

## 2012-09-06 NOTE — Progress Notes (Signed)
Maria Mckinney [id number] 04-30-1951 62 y.o. 09/06/2012 12:12 PM  CC  [name], MD Dr. [name] [address] [place] [address] Dr. [name] Dr. [name] Dr. 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  PRIOR THERAPY:  #1Patient was originally seen in the multidisciplinary breast clinic for new diagnosis of left breast cancer that was 8mm in the upper outer quadrant of the left breast.  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  There was an additional 4 cm area of normal signal.  Patient did not get an MRI secondary to cochlear implant.  She desires breast conservation.    #2 Patient is s/p port-a-cath placement for neoadjuvant chemotherapy consisting of Taxotere/carboplatinum/Herceptin.  She will begin cycle #1 on 02/29/2012.  She will receive Taxotere and carboplatinum every 21 days with Herceptin giving weekly.  She will receive a total of 4 cycles of Taxotere and Carboplatinum--which completed on 05/03/12.    #3 Herceptin every 3 weeks beginning 05/23/12  #4 Radiation therapy beginning on 08/14/12.  CURRENT THERAPY: Herceptin q3 weeks with concurrent radiation.    INTERVAL HISTORY: [name] is here for her q3 week herceptin.  She had an appt with Dr. [name] on 08/30/12 and echo and he cleared her to continue with Herceptin therapy.  She is in her fourth week of radiation therapy and doing relatively well.  She does have what she was told is a fungal rash under her left breast.  She's been on Fluconazole and it hasn't helped.  Otherwise, a 10 point ROS is neg.   Past Medical History: Past Medical History  Diagnosis Date  . HTN (hypertension)   . Fibromyalgia   . GERD (gastroesophageal reflux disease)   . Arthritis   . Anxiety   . Dysrhythmia     irreg hr  .  Wears hearing aid     left ear  . 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 PONV (postoperative nausea and vomiting)     Past Surgical History: Past Surgical History  Procedure Laterality Date  . Appendectomy    . Combined hysteroscopy diagnostic / d&c    . Cochlear implant    . Portacath placement  02/22/2012    Procedure: INSERTION PORT-A-CATH;  Surgeon: [name], MD;  Location:  [facility];  Service: General;  Laterality: Right;  . Breast lumpectomy with needle localization and axillary sentinel lymph node bx Left 06/27/2012    Procedure: BREAST LUMPECTOMY WITH NEEDLE LOCALIZATION AND AXILLARY SENTINEL LYMPH NODE BIOPSY;  Surgeon: [name], MD;  Location: [facility] OR;  Service: General;  Laterality: Left;  left breast wire localized at [facility] lumpectomy with left axillary sentinel node biopsy nuclear med 1400    Family History: Family History  Problem Relation Age of Onset  . Cancer Mother     "tumor of face removed"  . Cancer Father     kidney removed  . Cancer Maternal Aunt     stomach & Pancreatic  . Cancer Paternal Uncle     pancreatic    Social History History  Substance Use Topics  . Smoking status: Never Smoker   . Smokeless tobacco: Never Used  . Alcohol Use: 0.0 oz/week    0-1 Glasses of wine per week    Allergies: Allergies  Allergen Reactions  . Acyclovir And  Related   . Ciprofloxacin Hcl Rash  . Bactrim (Sulfamethoxazole W-Trimethoprim)     Current Medications: Current Outpatient Prescriptions  Medication Sig Dispense Refill  . bisoprolol-hydrochlorothiazide (ZIAC) 5-6.25 MG per tablet Take 1 tablet by mouth daily.      . cyclobenzaprine (FLEXERIL) 10 MG tablet Take 10 mg by mouth daily as needed for muscle spasms. For muscle pain.      . DULoxetine (CYMBALTA) 60 MG capsule Take 1 capsule (60 mg total) by mouth daily.  30 capsule  3  . esomeprazole (NEXIUM) 40 MG capsule Take 1 capsule (40 mg total) by mouth daily before  breakfast.  30 capsule  6  . fluconazole (DIFLUCAN) 100 MG tablet Take 100 mg by mouth daily.      Marland Kitchen HYDROcodone-acetaminophen (NORCO/VICODIN) 5-325 MG per tablet Take 1 tablet by mouth every 6 (six) hours as needed for pain.      Marland Kitchen levothyroxine (SYNTHROID, LEVOTHROID) 100 MCG tablet Take 100 mcg by mouth daily.      Marland Kitchen lidocaine-prilocaine (EMLA) cream Apply 1 application topically as needed. For port-a-cath access.      . meloxicam (MOBIC) 15 MG tablet Take 15 mg by mouth daily.      . mometasone (NASONEX) 50 MCG/ACT nasal spray Place 2 sprays into the nose daily.      Marland Kitchen PRESCRIPTION MEDICATION Inject into the vein. Herceptin infusion every 21 days      . pseudoephedrine (SUDAFED) 30 MG tablet Take 30 mg by mouth every 6 (six) hours as needed for congestion.        No current facility-administered medications for this visit.    Health Maintenance:  ColonoscopyShe has never had a colonoscopy Bone DensityBone density about 10-12 years ago Last PAP smearLast Pap smear was in 2012   REVIEW OF SYSTEMS: General: fatigue (-), night sweats (-), fever (-), pain (-) Lymph: palpable nodes (-) HEENT: vision changes (-), mucositis (-), gum bleeding (-), epistaxis (-) Cardiovascular: chest pain (-), palpitations (-) Pulmonary: shortness of breath (-), dyspnea on exertion (-), cough (-), hemoptysis (-) GI:  Early satiety (-), melena (-), dysphagia (-), nausea/vomiting (-), diarrhea (-) GU: dysuria (-), hematuria (-), incontinence (-) Musculoskeletal: joint swelling (-), joint pain (-), back pain (-) Neuro: weakness (-), numbness (-), headache (-), confusion (-) Skin: Rash (-), lesions (-), dryness (-) Psych: depression (-), suicidal/homicidal ideation (-), feeling of hopelessness (-)   PHYSICAL EXAMINATION: Blood pressure 110/66, pulse 79, temperature 98.3 F (36.8 C), temperature source Oral, resp. rate 20, height 5\' 2"  (1.575 m), weight 167 lb (75.751 kg). General: Patient is a well  appearing female in no acute distress HEENT: PERRLA, sclerae anicteric no conjunctival pallor, MMM,  Neck: supple, no palpable adenopathy Lungs: clear to auscultation bilaterally, no wheezes, rhonchi, or rales Cardiovascular: regular rate rhythm, S1, S2, no murmurs, rubs or gallops,  Abdomen: Soft, non-tender, non-distended, normoactive bowel sounds, no HSM Extremities: warm and well perfused, no clubbing, cyanosis, or edema Skin: No rashes or lesions Neuro: Non-focal ECOG PERFORMANCE STATUS: 1 - Symptomatic but completely ambulatory Breasts: left breast lumpectomy site healing well, clean dry and intact, no nodularity, right breast: no masses or nodularity Left breast with vesicular lesions in linear pattern along T6 STUDIES/RESULTS: No results found.   LABS:    Chemistry      Component Value Date/Time   NA 139 08/16/2012 0950   NA 139 06/21/2012 1235   K 3.3* 08/16/2012 0950   K 4.1 06/21/2012 1235   CL 102  08/16/2012 0950   CL 100 06/21/2012 1235   CO2 26 08/16/2012 0950   CO2 27 06/21/2012 1235   BUN 18.4 08/16/2012 0950   BUN 14 06/21/2012 1235   CREATININE 1.3* 08/16/2012 0950   CREATININE 1.24* 06/21/2012 1235      Component Value Date/Time   CALCIUM 9.5 08/16/2012 0950   CALCIUM 7.4* 06/21/2012 1235   ALKPHOS 95 08/16/2012 0950   ALKPHOS 59 05/10/2012 1909   AST 28 08/16/2012 0950   AST 30 05/10/2012 1909   ALT 25 08/16/2012 0950   ALT 69* 05/10/2012 1909   BILITOT 0.33 08/16/2012 0950   BILITOT 3.9* 05/10/2012 1909      Lab Results  Component Value Date   WBC 3.2* 09/06/2012   HGB 10.5* 09/06/2012   HCT 34.4* 09/06/2012   MCV 108.5* 09/06/2012   PLT 88* 09/06/2012      PATHOLOGY:  ADDITIONAL INFORMATION: PROGNOSTIC INDICATORS - ACIS Results IMMUNOHISTOCHEMICAL AND MORPHOMETRIC ANALYSIS BY THE AUTOMATED CELLULAR IMAGING SYSTEM (ACIS) Estrogen Receptor (Negative, <1%): 0%, NEGATIVE Progesterone Receptor (Negative, <1%): 0%, NEGATIVE Proliferation Marker Ki67 by M IB-1  (Low<20%): 98% All controls stained appropriately Pecola Leisure MD Pathologist, Electronic Signature ( Signed 02/15/2012) CHROMOGENIC IN-SITU HYBRIDIZATION Interpretation: HER2/NEU BY CISH - SHOWS AMPLIFICATION BY CISH ANALYSIS. THE RATIO OF HER2: CEP 17 SIGNALS WAS 2.55. 40 TUMOR CELLS WERE SCORED. OF NOTE, THE TUMOR APPEARS TO SHOW HETEROGENEITY IN REGARDS TO HER2 EXPRESSION. Reference range: Ratio: HER2:CEP17 < 1.8 gene amplification not observed Ratio: HER2:CEP 17 1.8-2.2 - equivocal result Ratio: HER2:CEP17 > 2.2 - gene amplification observed Pecola Leisure MD Pathologist, Electronic Signature ( Signed 02/15/2012) 1 of ADDITIONAL INFORMATION: PROGNOSTIC INDICATORS - ACIS Results IMMUNOHISTOCHEMICAL AND MORPHOMETRIC ANALYSIS BY THE AUTOMATED CELLULAR IMAGING SYSTEM (ACIS) Estrogen Receptor (Negative, <1%): 0%, NEGATIVE Progesterone Receptor (Negative, <1%): 0%, NEGATIVE COMMENT: The negative hormone receptor study(ies) in this case have an internal positive control. All controls stained appropriately Abigail Miyamoto MD Pathologist, Electronic Signature ( Signed 02/22/2012)  ASSESSMENT #1 Stage 1 HER-2 positive left breast cancer ER negative PR negative.  Patient is being considered for neoadjuvant chemotherapy consisting of Taxotere carboplatinum and herceptin as she does want breast conservation.  She understands the risks and benefits of this approach and the side effects of her chemotherapy and Herceptin.  #2 She understands she will receive neulasta the day after chemotherapy to keep her white count up.    #3 She does have thrombocytopenia and we will monitor this  #4 Shingles  PLAN:  #1 Ms. Delahunt is doing well today.  Her labs look good.  She will proceed with Herceptin today.  She is doing well with radiation therapy. She is macrocytic, we will check a b12 and folate with her next set of labs.    #2 I will see her back in 3 weeks for her next herceptin.    #3 Due  to the vesicular appearing lesions in a linear pattern under her left breast, I prescribed Acyclovir and she will take this for 10 days.    All questions were answered. The patient knows to call the clinic with any problems, questions or concerns. We can certainly see the patient much sooner if necessary.  I spent 25 minutes counseling the patient face to face. The total time spent in the appointment was 30 minutes.  Cherie Ouch Lyn Hollingshead, NP Medical Oncology [facility] Phone: [phone] 09/06/2012, 12:12 PM

## 2012-09-06 NOTE — Patient Instructions (Addendum)
[facility] Discharge Instructions for Patients Receiving Chemotherapy  Today you received the following chemotherapy agents Herceptin.  To help prevent nausea and vomiting after your treatment, we encourage you to take your nausea medication as prescribed.    If you develop nausea and vomiting that is not controlled by your nausea medication, call the clinic. If it is after clinic hours your family physician or the after hours number for the clinic or go to the Emergency Department.   BELOW ARE SYMPTOMS THAT SHOULD BE REPORTED IMMEDIATELY:  *FEVER GREATER THAN 100.5 F  *CHILLS WITH OR WITHOUT FEVER  NAUSEA AND VOMITING THAT IS NOT CONTROLLED WITH YOUR NAUSEA MEDICATION  *UNUSUAL SHORTNESS OF BREATH  *UNUSUAL BRUISING OR BLEEDING  TENDERNESS IN MOUTH AND THROAT WITH OR WITHOUT PRESENCE OF ULCERS  *URINARY PROBLEMS  *BOWEL PROBLEMS  UNUSUAL RASH  Please let the nurse know about any problems that you may have experienced. Feel free to call the clinic you have any questions or concerns. The clinic phone number is [phone].   I have been informed and understand all the instructions given to me. I know to contact the clinic, my physician, or go to the Emergency Department if any problems should occur. I do not have any questions at this time, but understand that I may call the clinic during office hours   should I have any questions or need assistance in obtaining follow up care.    __________________________________________  _____________  __________ Signature of Patient or Authorized Representative            Date                   Time    __________________________________________ Nurse's Signature    

## 2012-09-10 ENCOUNTER — Telehealth: Payer: Self-pay | Admitting: Emergency Medicine

## 2012-09-10 NOTE — Telephone Encounter (Signed)
Patient called to inform Dr Welton Flakes that she is allergic to the Acyclovir which was recently prescribed to her for the shingles.  Patient states that the Acyclovir gives her a rash all over.  Called patient back and left her a voicemail that per Augustin Schooling NP, she does not need a new prescription and that the Acyclovir was precautionary.   Instructed patient to call this office for any further questions or concerns.

## 2012-09-27 ENCOUNTER — Telehealth: Payer: Self-pay | Admitting: *Deleted

## 2012-09-27 ENCOUNTER — Encounter: Payer: Self-pay | Admitting: Adult Health

## 2012-09-27 ENCOUNTER — Ambulatory Visit (HOSPITAL_BASED_OUTPATIENT_CLINIC_OR_DEPARTMENT_OTHER): Payer: BC Managed Care – PPO | Admitting: Adult Health

## 2012-09-27 ENCOUNTER — Telehealth: Payer: Self-pay | Admitting: Oncology

## 2012-09-27 ENCOUNTER — Ambulatory Visit (HOSPITAL_BASED_OUTPATIENT_CLINIC_OR_DEPARTMENT_OTHER): Payer: BC Managed Care – PPO

## 2012-09-27 ENCOUNTER — Other Ambulatory Visit (HOSPITAL_BASED_OUTPATIENT_CLINIC_OR_DEPARTMENT_OTHER): Payer: BC Managed Care – PPO | Admitting: Lab

## 2012-09-27 ENCOUNTER — Encounter: Payer: Self-pay | Admitting: Oncology

## 2012-09-27 VITALS — BP 109/65 | HR 75 | Temp 98.0°F | Resp 20 | Ht 62.0 in | Wt 169.4 lb

## 2012-09-27 DIAGNOSIS — Z5112 Encounter for antineoplastic immunotherapy: Secondary | ICD-10-CM

## 2012-09-27 DIAGNOSIS — Z171 Estrogen receptor negative status [ER-]: Secondary | ICD-10-CM

## 2012-09-27 DIAGNOSIS — C50419 Malignant neoplasm of upper-outer quadrant of unspecified female breast: Secondary | ICD-10-CM

## 2012-09-27 DIAGNOSIS — D539 Nutritional anemia, unspecified: Secondary | ICD-10-CM

## 2012-09-27 DIAGNOSIS — D696 Thrombocytopenia, unspecified: Secondary | ICD-10-CM

## 2012-09-27 DIAGNOSIS — B029 Zoster without complications: Secondary | ICD-10-CM

## 2012-09-27 DIAGNOSIS — C50412 Malignant neoplasm of upper-outer quadrant of left female breast: Secondary | ICD-10-CM

## 2012-09-27 LAB — CBC WITH DIFFERENTIAL/PLATELET
BASO%: 0.4 % (ref 0.0–2.0)
MCHC: 34.3 g/dL (ref 31.5–36.0)
MONO#: 0.2 10*3/uL (ref 0.1–0.9)
NEUT#: 1 10*3/uL — ABNORMAL LOW (ref 1.5–6.5)
RBC: 3.22 10*6/uL — ABNORMAL LOW (ref 3.70–5.45)
RDW: 12.9 % (ref 11.2–14.5)
WBC: 2.3 10*3/uL — ABNORMAL LOW (ref 3.9–10.3)
lymph#: 1 10*3/uL (ref 0.9–3.3)
nRBC: 0 % (ref 0–0)

## 2012-09-27 LAB — COMPREHENSIVE METABOLIC PANEL (CC13)
ALT: 23 U/L (ref 0–55)
AST: 22 U/L (ref 5–34)
Calcium: 8.9 mg/dL (ref 8.4–10.4)
Chloride: 103 mEq/L (ref 98–107)
Creatinine: 1.1 mg/dL (ref 0.6–1.1)
Potassium: 3.2 mEq/L — ABNORMAL LOW (ref 3.5–5.1)
Sodium: 139 mEq/L (ref 136–145)
Total Protein: 6.1 g/dL — ABNORMAL LOW (ref 6.4–8.3)

## 2012-09-27 LAB — VITAMIN B12: Vitamin B-12: 113 pg/mL — ABNORMAL LOW (ref 211–911)

## 2012-09-27 MED ORDER — SODIUM CHLORIDE 0.9 % IJ SOLN
10.0000 mL | INTRAMUSCULAR | Status: DC | PRN
  Administered 2012-09-27: 10 mL
  Filled 2012-09-27: qty 10

## 2012-09-27 MED ORDER — ACETAMINOPHEN 325 MG PO TABS
650.0000 mg | ORAL_TABLET | Freq: Once | ORAL | Status: AC
  Administered 2012-09-27: 650 mg via ORAL

## 2012-09-27 MED ORDER — TRASTUZUMAB CHEMO INJECTION 440 MG
6.0000 mg/kg | Freq: Once | INTRAVENOUS | Status: AC
  Administered 2012-09-27: 462 mg via INTRAVENOUS
  Filled 2012-09-27: qty 22

## 2012-09-27 MED ORDER — HEPARIN SOD (PORK) LOCK FLUSH 100 UNIT/ML IV SOLN
500.0000 [IU] | Freq: Once | INTRAVENOUS | Status: AC | PRN
  Administered 2012-09-27: 500 [IU]
  Filled 2012-09-27: qty 5

## 2012-09-27 MED ORDER — SODIUM CHLORIDE 0.9 % IV SOLN
Freq: Once | INTRAVENOUS | Status: AC
  Administered 2012-09-27: 11:00:00 via INTRAVENOUS

## 2012-09-27 MED ORDER — LORAZEPAM 0.5 MG PO TABS: 0.5000 mg | ORAL_TABLET | Freq: Three times a day (TID) | ORAL | Status: DC | PRN

## 2012-09-27 NOTE — Telephone Encounter (Signed)
Per staff message and POF I have scheduled appts.  JMW  

## 2012-09-27 NOTE — Progress Notes (Signed)
1030-Pt refuses to take Benadryl today d/t she is driving.

## 2012-09-27 NOTE — Patient Instructions (Addendum)
[facility] Discharge Instructions for Patients Receiving Chemotherapy  Today you received the following chemotherapy agents Herceptin.  To help prevent nausea and vomiting after your treatment, we encourage you to take your nausea medication as ordered per MD.    If you develop nausea and vomiting that is not controlled by your nausea medication, call the clinic. If it is after clinic hours your family physician or the after hours number for the clinic or go to the Emergency Department.   BELOW ARE SYMPTOMS THAT SHOULD BE REPORTED IMMEDIATELY:  *FEVER GREATER THAN 100.5 F  *CHILLS WITH OR WITHOUT FEVER  NAUSEA AND VOMITING THAT IS NOT CONTROLLED WITH YOUR NAUSEA MEDICATION  *UNUSUAL SHORTNESS OF BREATH  *UNUSUAL BRUISING OR BLEEDING  TENDERNESS IN MOUTH AND THROAT WITH OR WITHOUT PRESENCE OF ULCERS  *URINARY PROBLEMS  *BOWEL PROBLEMS  UNUSUAL RASH Items with * indicate a potential emergency and should be followed up as soon as possible.  . Please let the nurse know about any problems that you may have experienced. Feel free to call the clinic you have any questions or concerns. The clinic phone number is [phone].   I have been informed and understand all the instructions given to me. I know to contact the clinic, my physician, or go to the Emergency Department if any problems should occur. I do not have any questions at this time, but understand that I may call the clinic during office hours   should I have any questions or need assistance in obtaining follow up care.    __________________________________________  _____________  __________ Signature of Patient or Authorized Representative            Date                   Time    __________________________________________ Nurse's Signature    

## 2012-09-27 NOTE — Progress Notes (Signed)
Maria Mckinney [id number] 1950-08-22 62 y.o. 09/27/2012 9:46 AM  CC  Maria Ravel, MD Dr. Burnell Mckinney [address] [place] [address] Dr. [name] Dr. [name] Dr. 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  PRIOR THERAPY:  #1Patient was originally seen in the multidisciplinary breast clinic for new diagnosis of left breast cancer that was 8mm in the upper outer quadrant of the left breast.  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  There was an additional 4 cm area of normal signal.  Patient did not get an MRI secondary to cochlear implant.  She desires breast conservation.    #2 Patient is s/p port-a-cath placement for neoadjuvant chemotherapy consisting of Taxotere/carboplatinum/Herceptin.  She will begin cycle #1 on 02/29/2012.  She will receive Taxotere and carboplatinum every 21 days with Herceptin giving weekly.  She will receive a total of 4 cycles of Taxotere and Carboplatinum--which completed on 05/03/12.    #3 Herceptin every 3 weeks beginning 05/23/12  #4 Radiation therapy beginning on 08/14/12.  CURRENT THERAPY: Herceptin q3 weeks with concurrent radiation.    INTERVAL HISTORY: [name] is here for her q3 week herceptin. She is doing well today and finishes radiation tomorrow.  She had shingles at her last visit.  She was prescribed Famciclovir by Dr. Clovis Mckinney and is tolerating it well.  She does have drainage from these wounds still.  Otherwise, she denies fevers, chills, nausea, vomiting, consitpation, palpitations, numbness, shortness of breath or any other difficulties.  She did have some trouble sleeping and would like a refill of her Ativan to help her rest and for her anxiety.    Past Medical History: Past Medical History  Diagnosis Date  . HTN  (hypertension)   . Fibromyalgia   . GERD (gastroesophageal reflux disease)   . Arthritis   . Anxiety   . Dysrhythmia     irreg hr  . Wears hearing aid     left ear  . 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 PONV (postoperative nausea and vomiting)     Past Surgical History: Past Surgical History  Procedure Laterality Date  . Appendectomy    . Combined hysteroscopy diagnostic / d&c    . Cochlear implant    . Portacath placement  02/22/2012    Procedure: INSERTION PORT-A-CATH;  Surgeon: [name], MD;  Location: [facility];  Service: General;  Laterality: Right;  . Breast lumpectomy with needle localization and axillary sentinel lymph node bx Left 06/27/2012    Procedure: BREAST LUMPECTOMY WITH NEEDLE LOCALIZATION AND AXILLARY SENTINEL LYMPH NODE BIOPSY;  Surgeon: [name], MD;  Location: [facility];  Service: General;  Laterality: Left;  left breast wire localized at [facility] lumpectomy with left axillary sentinel node biopsy nuclear med 1400    Family History: Family History  Problem Relation Age of Onset  . Cancer Mother     "tumor of face removed"  . Cancer Father     kidney removed  . Cancer Maternal Aunt     stomach & Pancreatic  . Cancer Paternal Uncle     pancreatic    Social History History  Substance Use Topics  . Smoking status: Never Smoker   . Smokeless tobacco: Never Used  . Alcohol Use: 0.0 oz/week    0-1 Glasses of wine per  week    Allergies: Allergies  Allergen Reactions  . Acyclovir And Related   . Ciprofloxacin Hcl Rash  . Bactrim (Sulfamethoxazole W-Trimethoprim)     Current Medications: Current Outpatient Prescriptions  Medication Sig Dispense Refill  . bisoprolol-hydrochlorothiazide (ZIAC) 5-6.25 MG per tablet Take 1 tablet by mouth daily.      . cyclobenzaprine (FLEXERIL) 10 MG tablet Take 10 mg by mouth daily as needed for muscle spasms. For muscle pain.      . DULoxetine (CYMBALTA) 60 MG capsule Take 1  capsule (60 mg total) by mouth daily.  30 capsule  3  . esomeprazole (NEXIUM) 40 MG capsule Take 1 capsule (40 mg total) by mouth daily before breakfast.  30 capsule  6  . famciclovir (FAMVIR) 500 MG tablet Take 500 mg by mouth 3 (three) times daily.      . fluconazole (DIFLUCAN) 100 MG tablet Take 100 mg by mouth daily.      Marland Kitchen HYDROcodone-acetaminophen (NORCO/VICODIN) 5-325 MG per tablet Take 1 tablet by mouth every 6 (six) hours as needed for pain.      Marland Kitchen levothyroxine (SYNTHROID, LEVOTHROID) 100 MCG tablet Take 100 mcg by mouth daily.      Marland Kitchen lidocaine-prilocaine (EMLA) cream Apply 1 application topically as needed. For port-a-cath access.      . meloxicam (MOBIC) 15 MG tablet Take 15 mg by mouth daily.      . mometasone (NASONEX) 50 MCG/ACT nasal spray Place 2 sprays into the nose daily.      Marland Kitchen PRESCRIPTION MEDICATION Inject into the vein. Herceptin infusion every 21 days      . pseudoephedrine (SUDAFED) 30 MG tablet Take 30 mg by mouth every 6 (six) hours as needed for congestion.        No current facility-administered medications for this visit.    Health Maintenance:  ColonoscopyShe has never had a colonoscopy Bone DensityBone density about 10-12 years ago Last PAP smearLast Pap smear was in 2012   REVIEW OF SYSTEMS: General: fatigue (-), night sweats (-), fever (-), pain (-) Lymph: palpable nodes (-) HEENT: vision changes (-), mucositis (-), gum bleeding (-), epistaxis (-) Cardiovascular: chest pain (-), palpitations (-) Pulmonary: shortness of breath (-), dyspnea on exertion (-), cough (-), hemoptysis (-) GI:  Early satiety (-), melena (-), dysphagia (-), nausea/vomiting (-), diarrhea (-) GU: dysuria (-), hematuria (-), incontinence (-) Musculoskeletal: joint swelling (-), joint pain (-), back pain (-) Neuro: weakness (-), numbness (-), headache (-), confusion (-) Skin: Rash (-), lesions (-), dryness (-) Psych: depression (-), suicidal/homicidal ideation (-), feeling of  hopelessness (-)   PHYSICAL EXAMINATION: Blood pressure 109/65, pulse 75, temperature 98 F (36.7 C), temperature source Oral, resp. rate 20, height 5\' 2"  (1.575 m), weight 169 lb 6 oz (76.828 kg). General: Patient is a well appearing female in no acute distress HEENT: PERRLA, sclerae anicteric no conjunctival pallor, MMM,  Neck: supple, no palpable adenopathy Lungs: clear to auscultation bilaterally, no wheezes, rhonchi, or rales Cardiovascular: regular rate rhythm, S1, S2, no murmurs, rubs or gallops,  Abdomen: Soft, non-tender, non-distended, normoactive bowel sounds, no HSM Extremities: warm and well perfused, no clubbing, cyanosis, or edema Skin: No rashes or lesions Neuro: Non-focal ECOG PERFORMANCE STATUS: 1 - Symptomatic but completely ambulatory Breasts: left breast lumpectomy site healing well, clean dry and intact, no nodularity, right breast: no masses or nodularity Left breast with vesicular lesions in linear pattern along T6 STUDIES/RESULTS: No results found.   LABS:    Chemistry  Component Value Date/Time   NA 140 09/06/2012 1128   NA 139 06/21/2012 1235   K 4.4 09/06/2012 1128   K 4.1 06/21/2012 1235   CL 105 09/06/2012 1128   CL 100 06/21/2012 1235   CO2 28 09/06/2012 1128   CO2 27 06/21/2012 1235   BUN 23.7 09/06/2012 1128   BUN 14 06/21/2012 1235   CREATININE 1.7* 09/06/2012 1128   CREATININE 1.24* 06/21/2012 1235      Component Value Date/Time   CALCIUM 9.3 09/06/2012 1128   CALCIUM 7.4* 06/21/2012 1235   ALKPHOS 97 09/06/2012 1128   ALKPHOS 59 05/10/2012 1909   AST 23 09/06/2012 1128   AST 30 05/10/2012 1909   ALT 18 09/06/2012 1128   ALT 69* 05/10/2012 1909   BILITOT 0.40 09/06/2012 1128   BILITOT 3.9* 05/10/2012 1909      Lab Results  Component Value Date   WBC 2.3* 09/27/2012   HGB 10.2* 09/27/2012   HCT 29.7* 09/27/2012   MCV 92.2 09/27/2012   PLT 87* 09/27/2012      PATHOLOGY:  ADDITIONAL INFORMATION: PROGNOSTIC INDICATORS - ACIS Results IMMUNOHISTOCHEMICAL  AND MORPHOMETRIC ANALYSIS BY THE AUTOMATED CELLULAR IMAGING SYSTEM (ACIS) Estrogen Receptor (Negative, <1%): 0%, NEGATIVE Progesterone Receptor (Negative, <1%): 0%, NEGATIVE Proliferation Marker Ki67 by M IB-1 (Low<20%): 98% All controls stained appropriately Pecola Leisure MD Pathologist, Electronic Signature ( Signed 02/15/2012) CHROMOGENIC IN-SITU HYBRIDIZATION Interpretation: HER2/NEU BY CISH - SHOWS AMPLIFICATION BY CISH ANALYSIS. THE RATIO OF HER2: CEP 17 SIGNALS WAS 2.55. 40 TUMOR CELLS WERE SCORED. OF NOTE, THE TUMOR APPEARS TO SHOW HETEROGENEITY IN REGARDS TO HER2 EXPRESSION. Reference range: Ratio: HER2:CEP17 < 1.8 gene amplification not observed Ratio: HER2:CEP 17 1.8-2.2 - equivocal result Ratio: HER2:CEP17 > 2.2 - gene amplification observed Pecola Leisure MD Pathologist, Electronic Signature ( Signed 02/15/2012) 1 of ADDITIONAL INFORMATION: PROGNOSTIC INDICATORS - ACIS Results IMMUNOHISTOCHEMICAL AND MORPHOMETRIC ANALYSIS BY THE AUTOMATED CELLULAR IMAGING SYSTEM (ACIS) Estrogen Receptor (Negative, <1%): 0%, NEGATIVE Progesterone Receptor (Negative, <1%): 0%, NEGATIVE COMMENT: The negative hormone receptor study(ies) in this case have an internal positive control. All controls stained appropriately Abigail Miyamoto MD Pathologist, Electronic Signature ( Signed 02/22/2012)  ASSESSMENT #1 Stage 1 HER-2 positive left breast cancer ER negative PR negative.  Patient is being considered for neoadjuvant chemotherapy consisting of Taxotere carboplatinum and herceptin as she does want breast conservation.  She understands the risks and benefits of this approach and the side effects of her chemotherapy and Herceptin.  #2 She understands she will receive neulasta the day after chemotherapy to keep her white count up.    #3 She does have thrombocytopenia and we will monitor this  #4 Shingles  PLAN:  #1 Ms. Siegenthaler is doing well today.  Her labs look good.  She will proceed with  Herceptin today.  She is doing well with radiation therapy. She is macrocytic, we will check a b12 and folate with todays labs.  I prescribed Ativan #30 to help her with her racing thoughts at night for sleep.   #2 I will see her back in 3 weeks for her next herceptin.    #3 Due to the vesicular appearing lesions in a linear pattern under her left breast she was prescribed Famciclovir.  She has tolerated this well and the lesions are improving.   All questions were answered. The patient knows to call the clinic with any problems, questions or concerns. We can certainly see the patient much sooner if necessary.  I spent 25  minutes counseling the patient face to face. The total time spent in the appointment was 30 minutes.  Cherie Ouch Lyn Hollingshead, NP Medical Oncology [facility] Phone: [phone] 09/27/2012, 9:46 AM

## 2012-09-27 NOTE — Patient Instructions (Signed)
Doing well.  Proceed with Herceptin.  Continue with the dressing in between the fold of your left breast.  Please call us if you have any questions or concerns.

## 2012-10-18 ENCOUNTER — Encounter: Payer: Self-pay | Admitting: Oncology

## 2012-10-18 ENCOUNTER — Ambulatory Visit (HOSPITAL_BASED_OUTPATIENT_CLINIC_OR_DEPARTMENT_OTHER): Payer: BC Managed Care – PPO

## 2012-10-18 ENCOUNTER — Other Ambulatory Visit (HOSPITAL_BASED_OUTPATIENT_CLINIC_OR_DEPARTMENT_OTHER): Payer: BC Managed Care – PPO | Admitting: Lab

## 2012-10-18 ENCOUNTER — Ambulatory Visit (HOSPITAL_BASED_OUTPATIENT_CLINIC_OR_DEPARTMENT_OTHER): Payer: BC Managed Care – PPO | Admitting: Oncology

## 2012-10-18 VITALS — BP 134/58 | HR 73 | Temp 98.2°F | Resp 20 | Ht 62.0 in | Wt 167.8 lb

## 2012-10-18 DIAGNOSIS — C50419 Malignant neoplasm of upper-outer quadrant of unspecified female breast: Secondary | ICD-10-CM

## 2012-10-18 DIAGNOSIS — Z5112 Encounter for antineoplastic immunotherapy: Secondary | ICD-10-CM

## 2012-10-18 DIAGNOSIS — D519 Vitamin B12 deficiency anemia, unspecified: Secondary | ICD-10-CM

## 2012-10-18 DIAGNOSIS — F411 Generalized anxiety disorder: Secondary | ICD-10-CM

## 2012-10-18 DIAGNOSIS — D696 Thrombocytopenia, unspecified: Secondary | ICD-10-CM

## 2012-10-18 DIAGNOSIS — Z171 Estrogen receptor negative status [ER-]: Secondary | ICD-10-CM

## 2012-10-18 HISTORY — DX: Vitamin B12 deficiency anemia, unspecified: D51.9

## 2012-10-18 LAB — COMPREHENSIVE METABOLIC PANEL (CC13)
AST: 28 U/L (ref 5–34)
Albumin: 3.8 g/dL (ref 3.5–5.0)
Alkaline Phosphatase: 94 U/L (ref 40–150)
Potassium: 3.5 mEq/L (ref 3.5–5.1)
Sodium: 140 mEq/L (ref 136–145)
Total Bilirubin: 0.41 mg/dL (ref 0.20–1.20)
Total Protein: 6.7 g/dL (ref 6.4–8.3)

## 2012-10-18 LAB — CBC WITH DIFFERENTIAL/PLATELET
EOS%: 3.8 % (ref 0.0–7.0)
LYMPH%: 33.8 % (ref 14.0–49.7)
MCH: 31.8 pg (ref 25.1–34.0)
MCHC: 34.8 g/dL (ref 31.5–36.0)
MCV: 91.6 fL (ref 79.5–101.0)
MONO%: 11.3 % (ref 0.0–14.0)
NEUT#: 1.2 10*3/uL — ABNORMAL LOW (ref 1.5–6.5)
RBC: 3.33 10*6/uL — ABNORMAL LOW (ref 3.70–5.45)
RDW: 13.6 % (ref 11.2–14.5)

## 2012-10-18 MED ORDER — TRASTUZUMAB CHEMO INJECTION 440 MG
6.0000 mg/kg | Freq: Once | INTRAVENOUS | Status: AC
  Administered 2012-10-18: 462 mg via INTRAVENOUS
  Filled 2012-10-18: qty 22

## 2012-10-18 MED ORDER — ACETAMINOPHEN 325 MG PO TABS
650.0000 mg | ORAL_TABLET | Freq: Once | ORAL | Status: AC
  Administered 2012-10-18: 650 mg via ORAL

## 2012-10-18 MED ORDER — SODIUM CHLORIDE 0.9 % IV SOLN
Freq: Once | INTRAVENOUS | Status: AC
  Administered 2012-10-18: 15:00:00 via INTRAVENOUS

## 2012-10-18 MED ORDER — CYANOCOBALAMIN 1000 MCG/ML IJ SOLN
1000.0000 ug | Freq: Once | INTRAMUSCULAR | Status: AC
  Administered 2012-10-18: 1000 ug via INTRAMUSCULAR

## 2012-10-18 MED ORDER — SODIUM CHLORIDE 0.9 % IJ SOLN
10.0000 mL | INTRAMUSCULAR | Status: DC | PRN
  Administered 2012-10-18: 10 mL
  Filled 2012-10-18: qty 10

## 2012-10-18 MED ORDER — DIPHENHYDRAMINE HCL 25 MG PO CAPS: 50.0000 mg | ORAL_CAPSULE | Freq: Once | ORAL | Status: DC

## 2012-10-18 MED ORDER — HEPARIN SOD (PORK) LOCK FLUSH 100 UNIT/ML IV SOLN
500.0000 [IU] | Freq: Once | INTRAVENOUS | Status: AC | PRN
  Administered 2012-10-18: 500 [IU]
  Filled 2012-10-18: qty 5

## 2012-10-18 NOTE — Progress Notes (Signed)
Maria Mckinney [id number] [birth year] 62 y.o. 10/18/2012 2:48 PM  CC  Maria Ravel, MD Dr. [name] [address] [place] [address] Dr. [name] Dr. [name] Dr. 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  PRIOR THERAPY:  #1Patient was originally seen in the multidisciplinary breast clinic for new diagnosis of left breast cancer that was 8mm in the upper outer quadrant of the left breast.  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  There was an additional 4 cm area of normal signal.  Patient did not get an MRI secondary to cochlear implant.  She desires breast conservation.    #2 Patient is s/p port-a-cath placement for neoadjuvant chemotherapy consisting of Taxotere/carboplatinum/Herceptin.  She will begin cycle #1 on 02/29/2012.  She will receive Taxotere and carboplatinum every 21 days with Herceptin giving weekly.  She will receive a total of 4 cycles of Taxotere and Carboplatinum--which completed on 05/03/12.    #3 Herceptin every 3 weeks beginning 05/23/12  #4 Radiation therapy beginning on 08/14/12 - 09/28/12.  CURRENT THERAPY: Herceptin q3 weeks and  INTERVAL HISTORY: [name] is here for her q3 week herceptin. She is doing well today she has completed radiation    She does have drainage from these wounds still.  Otherwise, she denies fevers, chills, nausea, vomiting, consitpation, palpitations, numbness, shortness of breath or any other difficulties.  She did have some trouble sleeping and would like a refill of her Ativan to help her rest and for her anxiety.    Past Medical History: Past Medical History  Diagnosis Date  . HTN (hypertension)   . Fibromyalgia   . GERD (gastroesophageal reflux disease)   . Arthritis   . Anxiety   . Dysrhythmia     irreg hr  .  Wears hearing aid     left ear  . 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 PONV (postoperative nausea and vomiting)     Past Surgical History: Past Surgical History  Procedure Laterality Date  . Appendectomy    . Combined hysteroscopy diagnostic / d&c    . Cochlear implant    . Portacath placement  02/22/2012    Procedure: INSERTION PORT-A-CATH;  Surgeon: [name], MD;  Location: [facility];  Service: General;  Laterality: Right;  . Breast lumpectomy with needle localization and axillary sentinel lymph node bx Left 06/27/2012    Procedure: BREAST LUMPECTOMY WITH NEEDLE LOCALIZATION AND AXILLARY SENTINEL LYMPH NODE BIOPSY;  Surgeon: [name], MD;  Location: [facility] OR;  Service: General;  Laterality: Left;  left breast wire localized at [facility] lumpectomy with left axillary sentinel node biopsy nuclear med 1400    Family History: Family History  Problem Relation Age of Onset  . Cancer Mother     "tumor of face removed"  . Cancer Father     kidney removed  . Cancer Maternal Aunt     stomach & Pancreatic  . Cancer Paternal Uncle     pancreatic    Social History History  Substance Use Topics  . Smoking status: Never Smoker   . Smokeless tobacco: Never Used  . Alcohol Use: 0.0 oz/week    0-1 Glasses of wine per week    Allergies: Allergies  Allergen Reactions  . Acyclovir And Related   . Ciprofloxacin Hcl Rash  .  Bactrim (Sulfamethoxazole W-Trimethoprim)     Current Medications: Current Outpatient Prescriptions  Medication Sig Dispense Refill  . bisoprolol-hydrochlorothiazide (ZIAC) 5-6.25 MG per tablet Take 1 tablet by mouth daily.      . cyclobenzaprine (FLEXERIL) 10 MG tablet Take 10 mg by mouth daily as needed for muscle spasms. For muscle pain.      . DULoxetine (CYMBALTA) 60 MG capsule Take 1 capsule (60 mg total) by mouth daily.  30 capsule  3  . esomeprazole (NEXIUM) 40 MG capsule Take 1 capsule (40 mg total) by mouth daily before  breakfast.  30 capsule  6  . famciclovir (FAMVIR) 500 MG tablet Take 500 mg by mouth 3 (three) times daily.      Marland Kitchen HYDROcodone-acetaminophen (NORCO/VICODIN) 5-325 MG per tablet Take 1 tablet by mouth every 6 (six) hours as needed for pain.      Marland Kitchen levothyroxine (SYNTHROID, LEVOTHROID) 100 MCG tablet Take 100 mcg by mouth daily.      Marland Kitchen lidocaine-prilocaine (EMLA) cream Apply 1 application topically as needed. For port-a-cath access.      Marland Kitchen LORazepam (ATIVAN) 0.5 MG tablet Take 1 tablet (0.5 mg total) by mouth every 8 (eight) hours as needed for anxiety.  30 tablet  0  . meloxicam (MOBIC) 15 MG tablet Take 15 mg by mouth daily.      . mometasone (NASONEX) 50 MCG/ACT nasal spray Place 2 sprays into the nose daily.      . pseudoephedrine (SUDAFED) 30 MG tablet Take 30 mg by mouth every 6 (six) hours as needed for congestion.       Marland Kitchen PRESCRIPTION MEDICATION Inject into the vein. Herceptin infusion every 21 days       No current facility-administered medications for this visit.    Health Maintenance:  ColonoscopyShe has never had a colonoscopy Bone DensityBone density about 10-12 years ago Last PAP smearLast Pap smear was in 2012   REVIEW OF SYSTEMS: General: fatigue (-), night sweats (-), fever (-), pain (-) Lymph: palpable nodes (-) HEENT: vision changes (-), mucositis (-), gum bleeding (-), epistaxis (-) Cardiovascular: chest pain (-), palpitations (-) Pulmonary: shortness of breath (-), dyspnea on exertion (-), cough (-), hemoptysis (-) GI:  Early satiety (-), melena (-), dysphagia (-), nausea/vomiting (-), diarrhea (-) GU: dysuria (-), hematuria (-), incontinence (-) Musculoskeletal: joint swelling (-), joint pain (-), back pain (-) Neuro: weakness (-), numbness (-), headache (-), confusion (-) Skin: Rash (-), lesions (-), dryness (-) Psych: depression (-), suicidal/homicidal ideation (-), feeling of hopelessness (-)   PHYSICAL EXAMINATION: Blood pressure 134/58, pulse 73,  temperature 98.2 F (36.8 C), temperature source Oral, resp. rate 20, height 5\' 2"  (1.575 m), weight 167 lb 12.8 oz (76.114 kg). General: Patient is a well appearing female in no acute distress HEENT: PERRLA, sclerae anicteric no conjunctival pallor, MMM,  Neck: supple, no palpable adenopathy Lungs: clear to auscultation bilaterally, no wheezes, rhonchi, or rales Cardiovascular: regular rate rhythm, S1, S2, no murmurs, rubs or gallops,  Abdomen: Soft, non-tender, non-distended, normoactive bowel sounds, no HSM Extremities: warm and well perfused, no clubbing, cyanosis, or edema Skin: No rashes or lesions Neuro: Non-focal ECOG PERFORMANCE STATUS: 1 - Symptomatic but completely ambulatory Breasts: left breast lumpectomy site healing well, clean dry and intact, no nodularity, right breast: no masses or nodularity Left breast with vesicular lesions in linear pattern along T6 STUDIES/RESULTS: No results found.   LABS:    Chemistry      Component Value Date/Time   NA 139  09/27/2012 0924   NA 139 06/21/2012 1235   K 3.2* 09/27/2012 0924   K 4.1 06/21/2012 1235   CL 103 09/27/2012 0924   CL 100 06/21/2012 1235   CO2 27 09/27/2012 0924   CO2 27 06/21/2012 1235   BUN 14.6 09/27/2012 0924   BUN 14 06/21/2012 1235   CREATININE 1.1 09/27/2012 0924   CREATININE 1.24* 06/21/2012 1235      Component Value Date/Time   CALCIUM 8.9 09/27/2012 0924   CALCIUM 7.4* 06/21/2012 1235   ALKPHOS 96 09/27/2012 0924   ALKPHOS 59 05/10/2012 1909   AST 22 09/27/2012 0924   AST 30 05/10/2012 1909   ALT 23 09/27/2012 0924   ALT 69* 05/10/2012 1909   BILITOT 0.28 09/27/2012 0924   BILITOT 3.9* 05/10/2012 1909      Lab Results  Component Value Date   WBC 2.4* 10/18/2012   HGB 10.6* 10/18/2012   HCT 30.5* 10/18/2012   MCV 91.6 10/18/2012   PLT 89* 10/18/2012      PATHOLOGY:  ADDITIONAL INFORMATION: PROGNOSTIC INDICATORS - ACIS Results IMMUNOHISTOCHEMICAL AND MORPHOMETRIC ANALYSIS BY THE AUTOMATED CELLULAR IMAGING  SYSTEM (ACIS) Estrogen Receptor (Negative, <1%): 0%, NEGATIVE Progesterone Receptor (Negative, <1%): 0%, NEGATIVE Proliferation Marker Ki67 by M IB-1 (Low<20%): 98% All controls stained appropriately Pecola Leisure MD Pathologist, Electronic Signature ( Signed 02/15/2012) CHROMOGENIC IN-SITU HYBRIDIZATION Interpretation: HER2/NEU BY CISH - SHOWS AMPLIFICATION BY CISH ANALYSIS. THE RATIO OF HER2: CEP 17 SIGNALS WAS 2.55. 40 TUMOR CELLS WERE SCORED. OF NOTE, THE TUMOR APPEARS TO SHOW HETEROGENEITY IN REGARDS TO HER2 EXPRESSION. Reference range: Ratio: HER2:CEP17 < 1.8 gene amplification not observed Ratio: HER2:CEP 17 1.8-2.2 - equivocal result Ratio: HER2:CEP17 > 2.2 - gene amplification observed Pecola Leisure MD Pathologist, Electronic Signature ( Signed 02/15/2012) 1 of ADDITIONAL INFORMATION: PROGNOSTIC INDICATORS - ACIS Results IMMUNOHISTOCHEMICAL AND MORPHOMETRIC ANALYSIS BY THE AUTOMATED CELLULAR IMAGING SYSTEM (ACIS) Estrogen Receptor (Negative, <1%): 0%, NEGATIVE Progesterone Receptor (Negative, <1%): 0%, NEGATIVE COMMENT: The negative hormone receptor study(ies) in this case have an internal positive control. All controls stained appropriately Abigail Miyamoto MD Pathologist, Electronic Signature ( Signed 02/22/2012)  ASSESSMENT #1 Stage 1 HER-2 positive left breast cancer ER negative PR negative.  Patient is being considered for neoadjuvant chemotherapy consisting of Taxotere carboplatinum and herceptin as she does want breast conservation.  She understands the risks and benefits of this approach and the side effects of her chemotherapy and Herceptin.  #2 She understands she will receive neulasta the day after chemotherapy to keep her white count up.    #3 She does have thrombocytopenia and we will monitor this  #4 Shingles improving  PLAN: #1 patient will proceed with scheduled Herceptin today.  #2 she will return in 3 months time for followup  All questions were  answered. The patient knows to call the clinic with any problems, questions or concerns. We can certainly see the patient much sooner if necessary.  I spent 25 minutes counseling the patient face to face. The total time spent in the appointment was 30 minutes.  Drue Second, MD Medical/Oncology [facility] [phone] (beeper) [phone] (Office)  10/18/2012, 2:48 PM

## 2012-10-18 NOTE — Patient Instructions (Signed)
[facility] Discharge Instructions for Patients Receiving Chemotherapy  Today you received the following chemotherapy agents Herceptin.  To help prevent nausea and vomiting after your treatment, we encourage you to take your nausea medication as prescribed.   If you develop nausea and vomiting that is not controlled by your nausea medication, call the clinic.   BELOW ARE SYMPTOMS THAT SHOULD BE REPORTED IMMEDIATELY:  *FEVER GREATER THAN 100.5 F  *CHILLS WITH OR WITHOUT FEVER  NAUSEA AND VOMITING THAT IS NOT CONTROLLED WITH YOUR NAUSEA MEDICATION  *UNUSUAL SHORTNESS OF BREATH  *UNUSUAL BRUISING OR BLEEDING  TENDERNESS IN MOUTH AND THROAT WITH OR WITHOUT PRESENCE OF ULCERS  *URINARY PROBLEMS  *BOWEL PROBLEMS  UNUSUAL RASH Items with * indicate a potential emergency and should be followed up as soon as possible.  Feel free to call the clinic you have any questions or concerns. The clinic phone number is [phone].    

## 2012-10-18 NOTE — Patient Instructions (Addendum)
Proceed with herceptin today and then every 3 weeks  Begin B12 inj q 4 months  I will see you back in 3 weeks for follow up and next herceptin

## 2012-10-19 ENCOUNTER — Telehealth: Payer: Self-pay | Admitting: Oncology

## 2012-10-19 ENCOUNTER — Telehealth: Payer: Self-pay | Admitting: *Deleted

## 2012-10-19 NOTE — Telephone Encounter (Signed)
Per staff message and POF I have scheduled appts.  JMW  

## 2012-11-06 ENCOUNTER — Telehealth: Payer: Self-pay | Admitting: Oncology

## 2012-11-08 ENCOUNTER — Encounter: Payer: Self-pay | Admitting: Adult Health

## 2012-11-08 ENCOUNTER — Telehealth: Payer: Self-pay | Admitting: Oncology

## 2012-11-08 ENCOUNTER — Ambulatory Visit (HOSPITAL_BASED_OUTPATIENT_CLINIC_OR_DEPARTMENT_OTHER): Payer: BC Managed Care – PPO | Admitting: Adult Health

## 2012-11-08 ENCOUNTER — Ambulatory Visit (HOSPITAL_BASED_OUTPATIENT_CLINIC_OR_DEPARTMENT_OTHER): Payer: BC Managed Care – PPO

## 2012-11-08 ENCOUNTER — Other Ambulatory Visit (HOSPITAL_BASED_OUTPATIENT_CLINIC_OR_DEPARTMENT_OTHER): Payer: BC Managed Care – PPO | Admitting: Lab

## 2012-11-08 VITALS — BP 133/68 | HR 66 | Temp 98.4°F | Resp 20 | Ht 62.0 in | Wt 169.5 lb

## 2012-11-08 DIAGNOSIS — C50419 Malignant neoplasm of upper-outer quadrant of unspecified female breast: Secondary | ICD-10-CM

## 2012-11-08 DIAGNOSIS — C50412 Malignant neoplasm of upper-outer quadrant of left female breast: Secondary | ICD-10-CM

## 2012-11-08 DIAGNOSIS — D518 Other vitamin B12 deficiency anemias: Secondary | ICD-10-CM

## 2012-11-08 DIAGNOSIS — Z5112 Encounter for antineoplastic immunotherapy: Secondary | ICD-10-CM

## 2012-11-08 DIAGNOSIS — D696 Thrombocytopenia, unspecified: Secondary | ICD-10-CM

## 2012-11-08 DIAGNOSIS — Z171 Estrogen receptor negative status [ER-]: Secondary | ICD-10-CM

## 2012-11-08 LAB — CBC WITH DIFFERENTIAL/PLATELET
Basophils Absolute: 0 10*3/uL (ref 0.0–0.1)
EOS%: 3.4 % (ref 0.0–7.0)
Eosinophils Absolute: 0.1 10*3/uL (ref 0.0–0.5)
HCT: 32.1 % — ABNORMAL LOW (ref 34.8–46.6)
HGB: 11.2 g/dL — ABNORMAL LOW (ref 11.6–15.9)
MCH: 31.5 pg (ref 25.1–34.0)
MCV: 90.4 fL (ref 79.5–101.0)
NEUT#: 1.4 10*3/uL — ABNORMAL LOW (ref 1.5–6.5)
NEUT%: 45.3 % (ref 38.4–76.8)
lymph#: 1.2 10*3/uL (ref 0.9–3.3)

## 2012-11-08 LAB — COMPREHENSIVE METABOLIC PANEL (CC13)
Albumin: 3.9 g/dL (ref 3.5–5.0)
Alkaline Phosphatase: 96 U/L (ref 40–150)
BUN: 16.4 mg/dL (ref 7.0–26.0)
CO2: 28 mEq/L (ref 22–29)
Calcium: 9.7 mg/dL (ref 8.4–10.4)
Glucose: 101 mg/dl (ref 70–140)
Potassium: 3.6 mEq/L (ref 3.5–5.1)
Total Protein: 6.5 g/dL (ref 6.4–8.3)

## 2012-11-08 MED ORDER — TRASTUZUMAB CHEMO INJECTION 440 MG
6.0000 mg/kg | Freq: Once | INTRAVENOUS | Status: AC
  Administered 2012-11-08: 462 mg via INTRAVENOUS
  Filled 2012-11-08: qty 22

## 2012-11-08 MED ORDER — SODIUM CHLORIDE 0.9 % IJ SOLN
10.0000 mL | INTRAMUSCULAR | Status: DC | PRN
  Administered 2012-11-08: 10 mL
  Filled 2012-11-08: qty 10

## 2012-11-08 MED ORDER — HEPARIN SOD (PORK) LOCK FLUSH 100 UNIT/ML IV SOLN
500.0000 [IU] | Freq: Once | INTRAVENOUS | Status: AC | PRN
  Administered 2012-11-08: 500 [IU]
  Filled 2012-11-08: qty 5

## 2012-11-08 MED ORDER — SODIUM CHLORIDE 0.9 % IV SOLN
Freq: Once | INTRAVENOUS | Status: AC
  Administered 2012-11-08: 11:00:00 via INTRAVENOUS

## 2012-11-08 MED ORDER — ACETAMINOPHEN 325 MG PO TABS
650.0000 mg | ORAL_TABLET | Freq: Once | ORAL | Status: AC
  Administered 2012-11-08: 650 mg via ORAL

## 2012-11-08 NOTE — Progress Notes (Signed)
Maria Mckinney [id number] January 18, 1951 62 y.o. 11/08/2012 10:10 AM  CC  Maria Ravel, MD Dr. [name] [address] [place] [address] Dr. [name] Dr. [name] Dr. 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  PRIOR THERAPY:  #1Patient was originally seen in the multidisciplinary breast clinic for new diagnosis of left breast cancer that was 8mm in the upper outer quadrant of the left breast.  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  There was an additional 4 cm area of normal signal.  Patient did not get an MRI secondary to cochlear implant.  She desires breast conservation.    #2 Patient is s/p port-a-cath placement for neoadjuvant chemotherapy consisting of Taxotere/carboplatinum/Herceptin.  She will begin cycle #1 on 02/29/2012.  She will receive Taxotere and carboplatinum every 21 days with Herceptin giving weekly.  She will receive a total of 4 cycles of Taxotere and Carboplatinum--which completed on 05/03/12.    #3 Herceptin every 3 weeks beginning 05/23/12.  #4 Lumpectomy by Dr. Dwain Sarna on 06/27/2012 showed complete pathologic response to neoadjuvant chemotherapy.  Sentinel nodes were negative.  #5 Radiation therapy from on 08/14/12 - 09/28/12.  CURRENT THERAPY: Herceptin q3 weeks  INTERVAL HISTORY: Maria Mckinney is here for her q3 week herceptin. She also started vitamin b 12 injections due to a deficiency that we detected.  She has noticed an increase in her energy level since receiving the injection.  She denies fevers, chills, nausea, vomiting, constipation, diarrhea, numbness, orthopnea, DOE, PND, or any further concerns.    Past Medical History: Past Medical History  Diagnosis Date  . HTN (hypertension)   . Fibromyalgia   . GERD (gastroesophageal reflux disease)    . Arthritis   . Anxiety   . Dysrhythmia     irreg hr  . Wears hearing aid     left ear  . 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 PONV (postoperative nausea and vomiting)   . B12 deficiency anemia 10/18/2012    Past Surgical History: Past Surgical History  Procedure Laterality Date  . Appendectomy    . Combined hysteroscopy diagnostic / d&c    . Cochlear implant    . Portacath placement  02/22/2012    Procedure: INSERTION PORT-A-CATH;  Surgeon: [name], MD;  Location: [facility];  Service: General;  Laterality: Right;  . Breast lumpectomy with needle localization and axillary sentinel lymph node bx Left 06/27/2012    Procedure: BREAST LUMPECTOMY WITH NEEDLE LOCALIZATION AND AXILLARY SENTINEL LYMPH NODE BIOPSY;  Surgeon: [name], MD;  Location: [facility];  Service: General;  Laterality: Left;  left breast wire localized at [facility] lumpectomy with left axillary sentinel node biopsy nuclear med 1400    Family History: Family History  Problem Relation Age of Onset  . Cancer Mother     "tumor of face removed"  . Cancer Father     kidney removed  . Cancer Maternal Aunt     stomach & Pancreatic  . Cancer Paternal Uncle     pancreatic    Social History History  Substance Use Topics  . Smoking status: Never Smoker   . Smokeless tobacco: Never Used  . Alcohol Use: 0.0 oz/week    0-1 Glasses of wine per week    Allergies: Allergies  Allergen Reactions  . Acyclovir  And Related   . Ciprofloxacin Hcl Rash  . Bactrim (Sulfamethoxazole W-Trimethoprim)     Current Medications: Current Outpatient Prescriptions  Medication Sig Dispense Refill  . bisoprolol-hydrochlorothiazide (ZIAC) 5-6.25 MG per tablet Take 1 tablet by mouth daily.      . cyclobenzaprine (FLEXERIL) 10 MG tablet Take 10 mg by mouth daily as needed for muscle spasms. For muscle pain.      . DULoxetine (CYMBALTA) 60 MG capsule Take 1 capsule (60 mg total) by mouth daily.  30  capsule  3  . esomeprazole (NEXIUM) 40 MG capsule Take 1 capsule (40 mg total) by mouth daily before breakfast.  30 capsule  6  . famciclovir (FAMVIR) 500 MG tablet Take 500 mg by mouth 3 (three) times daily.      . fluconazole (DIFLUCAN) 100 MG tablet       . HYDROcodone-acetaminophen (NORCO/VICODIN) 5-325 MG per tablet Take 1 tablet by mouth every 6 (six) hours as needed for pain.      Marland Kitchen levothyroxine (SYNTHROID, LEVOTHROID) 100 MCG tablet Take 100 mcg by mouth daily.      Marland Kitchen lidocaine-prilocaine (EMLA) cream Apply 1 application topically as needed. For port-a-cath access.      Marland Kitchen LORazepam (ATIVAN) 0.5 MG tablet Take 1 tablet (0.5 mg total) by mouth every 8 (eight) hours as needed for anxiety.  30 tablet  0  . meloxicam (MOBIC) 15 MG tablet Take 15 mg by mouth daily.      . mometasone (NASONEX) 50 MCG/ACT nasal spray Place 2 sprays into the nose daily.      Marland Kitchen PRESCRIPTION MEDICATION Inject into the vein. Herceptin infusion every 21 days      . pseudoephedrine (SUDAFED) 30 MG tablet Take 30 mg by mouth every 6 (six) hours as needed for congestion.        No current facility-administered medications for this visit.    Health Maintenance:  ColonoscopyShe has never had a colonoscopy Bone DensityBone density about 10-12 years ago Last PAP smearLast Pap smear was in 2012   REVIEW OF SYSTEMS: General: fatigue (-), night sweats (-), fever (-), pain (-) Lymph: palpable nodes (-) HEENT: vision changes (-), mucositis (-), gum bleeding (-), epistaxis (-) Cardiovascular: chest pain (-), palpitations (-) Pulmonary: shortness of breath (-), dyspnea on exertion (-), cough (-), hemoptysis (-) GI:  Early satiety (-), melena (-), dysphagia (-), nausea/vomiting (-), diarrhea (-) GU: dysuria (-), hematuria (-), incontinence (-) Musculoskeletal: joint swelling (-), joint pain (-), back pain (-) Neuro: weakness (-), numbness (-), headache (-), confusion (-) Skin: Rash (-), lesions (-), dryness (-) Psych:  depression (-), suicidal/homicidal ideation (-), feeling of hopelessness (-)   PHYSICAL EXAMINATION: Blood pressure 133/68, pulse 66, temperature 98.4 F (36.9 C), temperature source Oral, resp. rate 20, height 5\' 2"  (1.575 m), weight 169 lb 8 oz (76.885 kg). General: Patient is a well appearing female in no acute distress HEENT: PERRLA, sclerae anicteric no conjunctival pallor, MMM,  Neck: supple, no palpable adenopathy Lungs: clear to auscultation bilaterally, no wheezes, rhonchi, or rales Cardiovascular: regular rate rhythm, S1, S2, no murmurs, rubs or gallops,  Abdomen: Soft, non-tender, non-distended, normoactive bowel sounds, no HSM Extremities: warm and well perfused, no clubbing, cyanosis, or edema Skin: No rashes or lesions Neuro: Non-focal ECOG PERFORMANCE STATUS: 1 - Symptomatic but completely ambulatory Breasts: left breast lumpectomy site healing well, clean dry and intact, no nodularity, right breast: no masses or nodularity Left breast with vesicular lesions in linear pattern along T6 STUDIES/RESULTS:  No results found.   LABS:    Chemistry      Component Value Date/Time   NA 140 10/18/2012 1353   NA 139 06/21/2012 1235   K 3.5 10/18/2012 1353   K 4.1 06/21/2012 1235   CL 102 10/18/2012 1353   CL 100 06/21/2012 1235   CO2 27 10/18/2012 1353   CO2 27 06/21/2012 1235   BUN 16.4 10/18/2012 1353   BUN 14 06/21/2012 1235   CREATININE 1.1 10/18/2012 1353   CREATININE 1.24* 06/21/2012 1235      Component Value Date/Time   CALCIUM 9.7 10/18/2012 1353   CALCIUM 7.4* 06/21/2012 1235   ALKPHOS 94 10/18/2012 1353   ALKPHOS 59 05/10/2012 1909   AST 28 10/18/2012 1353   AST 30 05/10/2012 1909   ALT 23 10/18/2012 1353   ALT 69* 05/10/2012 1909   BILITOT 0.41 10/18/2012 1353   BILITOT 3.9* 05/10/2012 1909      Lab Results  Component Value Date   WBC 3.0* 11/08/2012   HGB 11.2* 11/08/2012   HCT 32.1* 11/08/2012   MCV 90.4 11/08/2012   PLT 94* 11/08/2012      PATHOLOGY:  ADDITIONAL  INFORMATION: PROGNOSTIC INDICATORS - ACIS Results IMMUNOHISTOCHEMICAL AND MORPHOMETRIC ANALYSIS BY THE AUTOMATED CELLULAR IMAGING SYSTEM (ACIS) Estrogen Receptor (Negative, <1%): 0%, NEGATIVE Progesterone Receptor (Negative, <1%): 0%, NEGATIVE Proliferation Marker Ki67 by M IB-1 (Low<20%): 98% All controls stained appropriately Pecola Leisure MD Pathologist, Electronic Signature ( Signed 02/15/2012) CHROMOGENIC IN-SITU HYBRIDIZATION Interpretation: HER2/NEU BY CISH - SHOWS AMPLIFICATION BY CISH ANALYSIS. THE RATIO OF HER2: CEP 17 SIGNALS WAS 2.55. 40 TUMOR CELLS WERE SCORED. OF NOTE, THE TUMOR APPEARS TO SHOW HETEROGENEITY IN REGARDS TO HER2 EXPRESSION. Reference range: Ratio: HER2:CEP17 < 1.8 gene amplification not observed Ratio: HER2:CEP 17 1.8-2.2 - equivocal result Ratio: HER2:CEP17 > 2.2 - gene amplification observed Pecola Leisure MD Pathologist, Electronic Signature ( Signed 02/15/2012) 1 of ADDITIONAL INFORMATION: PROGNOSTIC INDICATORS - ACIS Results IMMUNOHISTOCHEMICAL AND MORPHOMETRIC ANALYSIS BY THE AUTOMATED CELLULAR IMAGING SYSTEM (ACIS) Estrogen Receptor (Negative, <1%): 0%, NEGATIVE Progesterone Receptor (Negative, <1%): 0%, NEGATIVE COMMENT: The negative hormone receptor study(ies) in this case have an internal positive control. All controls stained appropriately Abigail Miyamoto MD Pathologist, Electronic Signature ( Signed 02/22/2012)  ASSESSMENT #1 Stage 1 HER-2 positive left breast cancer ER negative PR negative.  Patient received for neoadjuvant chemotherapy consisting of Taxotere carboplatinum and herceptin as she does want breast conservation.  She understands the risks and benefits of this approach and the side effects of her chemotherapy and Herceptin. She has completed surgery, radiation therapy, and has will continue Adjuvant Herceptin to complete 1 year of herceptin therapy.    #2  She does have thrombocytopenia and we will monitor this  PLAN: #1  patient will proceed with scheduled Herceptin today.  #2 she will return in 1 week for her Vitamin B 12 deficiency, and 3 weeks for next Herceptin.    All questions were answered. The patient knows to call the clinic with any problems, questions or concerns. We can certainly see the patient much sooner if necessary.  I spent 25 minutes counseling the patient face to face. The total time spent in the appointment was 30 minutes.  Drue Second, MD Medical/Oncology [facility] [phone] (beeper) [phone] (Office)  11/08/2012, 10:10 AM

## 2012-11-08 NOTE — Telephone Encounter (Signed)
, °

## 2012-11-08 NOTE — Patient Instructions (Signed)
[facility] Discharge Instructions for Patients Receiving Chemotherapy  Today you received the following chemotherapy agents Herceptin  To help prevent nausea and vomiting after your treatment, we encourage you to take your nausea medication     If you develop nausea and vomiting that is not controlled by your nausea medication, call the clinic.   BELOW ARE SYMPTOMS THAT SHOULD BE REPORTED IMMEDIATELY:  *FEVER GREATER THAN 100.5 F  *CHILLS WITH OR WITHOUT FEVER  NAUSEA AND VOMITING THAT IS NOT CONTROLLED WITH YOUR NAUSEA MEDICATION  *UNUSUAL SHORTNESS OF BREATH  *UNUSUAL BRUISING OR BLEEDING  TENDERNESS IN MOUTH AND THROAT WITH OR WITHOUT PRESENCE OF ULCERS  *URINARY PROBLEMS  *BOWEL PROBLEMS  UNUSUAL RASH Items with * indicate a potential emergency and should be followed up as soon as possible.  Feel free to call the clinic you have any questions or concerns. The clinic phone number is [phone].    

## 2012-11-08 NOTE — Patient Instructions (Signed)
Doing well.  Proceed with Herceptin.  We will see you back in 3 weeks.  Please call us if you have any questions or concerns.

## 2012-11-15 ENCOUNTER — Ambulatory Visit (HOSPITAL_BASED_OUTPATIENT_CLINIC_OR_DEPARTMENT_OTHER): Payer: BC Managed Care – PPO

## 2012-11-15 VITALS — BP 123/57 | HR 67 | Temp 98.8°F

## 2012-11-15 DIAGNOSIS — D519 Vitamin B12 deficiency anemia, unspecified: Secondary | ICD-10-CM

## 2012-11-15 DIAGNOSIS — D518 Other vitamin B12 deficiency anemias: Secondary | ICD-10-CM

## 2012-11-15 MED ORDER — CYANOCOBALAMIN 1000 MCG/ML IJ SOLN
1000.0000 ug | Freq: Once | INTRAMUSCULAR | Status: AC
  Administered 2012-11-15: 1000 ug via INTRAMUSCULAR

## 2012-11-29 ENCOUNTER — Encounter: Payer: Self-pay | Admitting: Oncology

## 2012-11-29 ENCOUNTER — Other Ambulatory Visit (HOSPITAL_BASED_OUTPATIENT_CLINIC_OR_DEPARTMENT_OTHER): Payer: BC Managed Care – PPO | Admitting: Lab

## 2012-11-29 ENCOUNTER — Ambulatory Visit (HOSPITAL_BASED_OUTPATIENT_CLINIC_OR_DEPARTMENT_OTHER): Payer: BC Managed Care – PPO | Admitting: Oncology

## 2012-11-29 ENCOUNTER — Ambulatory Visit (HOSPITAL_BASED_OUTPATIENT_CLINIC_OR_DEPARTMENT_OTHER): Payer: BC Managed Care – PPO

## 2012-11-29 VITALS — BP 118/68 | HR 65 | Temp 98.9°F | Resp 20 | Ht 62.0 in | Wt 172.5 lb

## 2012-11-29 DIAGNOSIS — C50419 Malignant neoplasm of upper-outer quadrant of unspecified female breast: Secondary | ICD-10-CM

## 2012-11-29 DIAGNOSIS — E538 Deficiency of other specified B group vitamins: Secondary | ICD-10-CM

## 2012-11-29 DIAGNOSIS — Z171 Estrogen receptor negative status [ER-]: Secondary | ICD-10-CM

## 2012-11-29 DIAGNOSIS — D696 Thrombocytopenia, unspecified: Secondary | ICD-10-CM

## 2012-11-29 DIAGNOSIS — Z5112 Encounter for antineoplastic immunotherapy: Secondary | ICD-10-CM

## 2012-11-29 LAB — COMPREHENSIVE METABOLIC PANEL (CC13)
ALT: 18 U/L (ref 0–55)
Alkaline Phosphatase: 92 U/L (ref 40–150)
Sodium: 140 mEq/L (ref 136–145)
Total Bilirubin: 0.39 mg/dL (ref 0.20–1.20)
Total Protein: 6.4 g/dL (ref 6.4–8.3)

## 2012-11-29 LAB — CBC WITH DIFFERENTIAL/PLATELET
EOS%: 3.9 % (ref 0.0–7.0)
Eosinophils Absolute: 0.1 10*3/uL (ref 0.0–0.5)
MCH: 31.3 pg (ref 25.1–34.0)
MCV: 89.8 fL (ref 79.5–101.0)
MONO%: 10.3 % (ref 0.0–14.0)
NEUT#: 1.2 10*3/uL — ABNORMAL LOW (ref 1.5–6.5)
RBC: 3.52 10*6/uL — ABNORMAL LOW (ref 3.70–5.45)
RDW: 12.9 % (ref 11.2–14.5)
lymph#: 1.4 10*3/uL (ref 0.9–3.3)
nRBC: 0 % (ref 0–0)

## 2012-11-29 MED ORDER — SODIUM CHLORIDE 0.9 % IV SOLN
Freq: Once | INTRAVENOUS | Status: AC
  Administered 2012-11-29: 11:00:00 via INTRAVENOUS

## 2012-11-29 MED ORDER — ACETAMINOPHEN 325 MG PO TABS
650.0000 mg | ORAL_TABLET | Freq: Once | ORAL | Status: AC
  Administered 2012-11-29: 650 mg via ORAL

## 2012-11-29 MED ORDER — SODIUM CHLORIDE 0.9 % IJ SOLN
10.0000 mL | INTRAMUSCULAR | Status: DC | PRN
  Administered 2012-11-29: 10 mL
  Filled 2012-11-29: qty 10

## 2012-11-29 MED ORDER — DIPHENHYDRAMINE HCL 25 MG PO CAPS: 50.0000 mg | ORAL_CAPSULE | Freq: Once | ORAL | Status: DC

## 2012-11-29 MED ORDER — HEPARIN SOD (PORK) LOCK FLUSH 100 UNIT/ML IV SOLN
500.0000 [IU] | Freq: Once | INTRAVENOUS | Status: AC | PRN
  Administered 2012-11-29: 500 [IU]
  Filled 2012-11-29: qty 5

## 2012-11-29 MED ORDER — TRASTUZUMAB CHEMO INJECTION 440 MG
6.0000 mg/kg | Freq: Once | INTRAVENOUS | Status: AC
  Administered 2012-11-29: 462 mg via INTRAVENOUS
  Filled 2012-11-29: qty 22

## 2012-11-29 NOTE — Patient Instructions (Addendum)
Proceed with herceptin today  Return in 3 weeks for next dose of Herceptin  B12 inj on 8/21

## 2012-11-29 NOTE — Progress Notes (Signed)
Maria Mckinney [id number] 04-25-1951 62 y.o. 11/29/2012 10:48 AM  CC  [name], MD Dr. [name] [address] [place] [address] Dr. [name] Dr. [name] Dr. 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  PRIOR THERAPY:  #1Patient was originally seen in the multidisciplinary breast clinic for new diagnosis of left breast cancer that was 8mm in the upper outer quadrant of the left breast.  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  There was an additional 4 cm area of normal signal.  Patient did not get an MRI secondary to cochlear implant.  She desires breast conservation.    #2 Patient is s/p port-a-cath placement for neoadjuvant chemotherapy consisting of Taxotere/carboplatinum/Herceptin.  She will begin cycle #1 on 02/29/2012.  She will receive Taxotere and carboplatinum every 21 days with Herceptin giving weekly.  She will receive a total of 4 cycles of Taxotere and Carboplatinum--which completed on 05/03/12.    #3 Herceptin every 3 weeks beginning 05/23/12.  #4 Lumpectomy by Dr. Dwain Sarna on 06/27/2012 showed complete pathologic response to neoadjuvant chemotherapy.  Sentinel nodes were negative.  #5 Radiation therapy from on 08/14/12 - 09/28/12.  CURRENT THERAPY: Herceptin q3 weeks  INTERVAL HISTORY: Ms vanpelt is here for her q3 week herceptin. She also started vitamin b 12 injections due to a deficiency that we detected.  She has noticed an increase in her energy level since receiving the injection.  She denies fevers, chills, nausea, vomiting, constipation, diarrhea, numbness, orthopnea, DOE, PND, or any further concerns.    Past Medical History: Past Medical History  Diagnosis Date  . HTN (hypertension)   . Fibromyalgia   . GERD (gastroesophageal reflux disease)    . Arthritis   . Anxiety   . Dysrhythmia     irreg hr  . Wears hearing aid     left ear  . 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 PONV (postoperative nausea and vomiting)   . B12 deficiency anemia 10/18/2012    Past Surgical History: Past Surgical History  Procedure Laterality Date  . Appendectomy    . Combined hysteroscopy diagnostic / d&c    . Cochlear implant    . Portacath placement  02/22/2012    Procedure: INSERTION PORT-A-CATH;  Surgeon: [name], MD;  Location: [facility];  Service: General;  Laterality: Right;  . Breast lumpectomy with needle localization and axillary sentinel lymph node bx Left 06/27/2012    Procedure: BREAST LUMPECTOMY WITH NEEDLE LOCALIZATION AND AXILLARY SENTINEL LYMPH NODE BIOPSY;  Surgeon: [name], MD;  Location: [facility] OR;  Service: General;  Laterality: Left;  left breast wire localized at [facility] lumpectomy with left axillary sentinel node biopsy nuclear med 1400    Family History: Family History  Problem Relation Age of Onset  . Cancer Mother     "tumor of face removed"  . Cancer Father     kidney removed  . Cancer Maternal Aunt     stomach & Pancreatic  . Cancer Paternal Uncle     pancreatic    Social History History  Substance Use Topics  . Smoking status: Never Smoker   . Smokeless tobacco: Never Used  . Alcohol Use: 0.0 oz/week    0-1 Glasses of wine per week    Allergies: Allergies  Allergen Reactions  . Acyclovir  And Related   . Ciprofloxacin Hcl Rash  . Bactrim (Sulfamethoxazole W-Trimethoprim)     Current Medications: Current Outpatient Prescriptions  Medication Sig Dispense Refill  . bisoprolol-hydrochlorothiazide (ZIAC) 5-6.25 MG per tablet Take 1 tablet by mouth daily.      . cyclobenzaprine (FLEXERIL) 10 MG tablet Take 10 mg by mouth daily as needed for muscle spasms. For muscle pain.      . DULoxetine (CYMBALTA) 60 MG capsule Take 1 capsule (60 mg total) by mouth daily.  30  capsule  3  . esomeprazole (NEXIUM) 40 MG capsule Take 1 capsule (40 mg total) by mouth daily before breakfast.  30 capsule  6  . HYDROcodone-acetaminophen (NORCO/VICODIN) 5-325 MG per tablet Take 1 tablet by mouth every 6 (six) hours as needed for pain.      Marland Kitchen levothyroxine (SYNTHROID, LEVOTHROID) 100 MCG tablet Take 100 mcg by mouth daily.      Marland Kitchen lidocaine-prilocaine (EMLA) cream Apply 1 application topically as needed. For port-a-cath access.      Marland Kitchen LORazepam (ATIVAN) 0.5 MG tablet Take 1 tablet (0.5 mg total) by mouth every 8 (eight) hours as needed for anxiety.  30 tablet  0  . meloxicam (MOBIC) 15 MG tablet Take 15 mg by mouth daily.      . mometasone (NASONEX) 50 MCG/ACT nasal spray Place 2 sprays into the nose daily.      . pseudoephedrine (SUDAFED) 30 MG tablet Take 30 mg by mouth every 6 (six) hours as needed for congestion.       Marland Kitchen PRESCRIPTION MEDICATION Inject into the vein. Herceptin infusion every 21 days       No current facility-administered medications for this visit.    Health Maintenance:  ColonoscopyShe has never had a colonoscopy Bone DensityBone density about 10-12 years ago Last PAP smearLast Pap smear was in 2012   REVIEW OF SYSTEMS: General: fatigue (-), night sweats (-), fever (-), pain (-) Lymph: palpable nodes (-) HEENT: vision changes (-), mucositis (-), gum bleeding (-), epistaxis (-) Cardiovascular: chest pain (-), palpitations (-) Pulmonary: shortness of breath (-), dyspnea on exertion (-), cough (-), hemoptysis (-) GI:  Early satiety (-), melena (-), dysphagia (-), nausea/vomiting (-), diarrhea (-) GU: dysuria (-), hematuria (-), incontinence (-) Musculoskeletal: joint swelling (-), joint pain (-), back pain (-) Neuro: weakness (-), numbness (-), headache (-), confusion (-) Skin: Rash (-), lesions (-), dryness (-) Psych: depression (-), suicidal/homicidal ideation (-), feeling of hopelessness (-)   PHYSICAL EXAMINATION: Blood pressure 118/68,  pulse 65, temperature 98.9 F (37.2 C), temperature source Oral, resp. rate 20, height 5\' 2"  (1.575 m), weight 172 lb 8 oz (78.245 kg). General: Patient is a well appearing female in no acute distress HEENT: PERRLA, sclerae anicteric no conjunctival pallor, MMM,  Neck: supple, no palpable adenopathy Lungs: clear to auscultation bilaterally, no wheezes, rhonchi, or rales Cardiovascular: regular rate rhythm, S1, S2, no murmurs, rubs or gallops,  Abdomen: Soft, non-tender, non-distended, normoactive bowel sounds, no HSM Extremities: warm and well perfused, no clubbing, cyanosis, or edema Skin: No rashes or lesions Neuro: Non-focal ECOG PERFORMANCE STATUS: 1 - Symptomatic but completely ambulatory Breasts: left breast lumpectomy site healing well, clean dry and intact, no nodularity, right breast: no masses or nodularity Left breast with vesicular lesions in linear pattern along T6 STUDIES/RESULTS: No results found.   LABS:    Chemistry      Component Value Date/Time   NA 141 11/08/2012 0951   NA 139 06/21/2012 1235  K 3.6 11/08/2012 0951   K 4.1 06/21/2012 1235   CL 102 10/18/2012 1353   CL 100 06/21/2012 1235   CO2 28 11/08/2012 0951   CO2 27 06/21/2012 1235   BUN 16.4 11/08/2012 0951   BUN 14 06/21/2012 1235   CREATININE 1.3* 11/08/2012 0951   CREATININE 1.24* 06/21/2012 1235      Component Value Date/Time   CALCIUM 9.7 11/08/2012 0951   CALCIUM 7.4* 06/21/2012 1235   ALKPHOS 96 11/08/2012 0951   ALKPHOS 59 05/10/2012 1909   AST 22 11/08/2012 0951   AST 30 05/10/2012 1909   ALT 23 11/08/2012 0951   ALT 69* 05/10/2012 1909   BILITOT 0.40 11/08/2012 0951   BILITOT 3.9* 05/10/2012 1909      Lab Results  Component Value Date   WBC 3.1* 11/29/2012   HGB 11.0* 11/29/2012   HCT 31.6* 11/29/2012   MCV 89.8 11/29/2012   PLT 93* 11/29/2012      PATHOLOGY:  ADDITIONAL INFORMATION: PROGNOSTIC INDICATORS - ACIS Results IMMUNOHISTOCHEMICAL AND MORPHOMETRIC ANALYSIS BY THE AUTOMATED  CELLULAR IMAGING SYSTEM (ACIS) Estrogen Receptor (Negative, <1%): 0%, NEGATIVE Progesterone Receptor (Negative, <1%): 0%, NEGATIVE Proliferation Marker Ki67 by M IB-1 (Low<20%): 98% All controls stained appropriately Pecola Leisure MD Pathologist, Electronic Signature ( Signed 02/15/2012) CHROMOGENIC IN-SITU HYBRIDIZATION Interpretation: HER2/NEU BY CISH - SHOWS AMPLIFICATION BY CISH ANALYSIS. THE RATIO OF HER2: CEP 17 SIGNALS WAS 2.55. 40 TUMOR CELLS WERE SCORED. OF NOTE, THE TUMOR APPEARS TO SHOW HETEROGENEITY IN REGARDS TO HER2 EXPRESSION. Reference range: Ratio: HER2:CEP17 < 1.8 gene amplification not observed Ratio: HER2:CEP 17 1.8-2.2 - equivocal result Ratio: HER2:CEP17 > 2.2 - gene amplification observed Pecola Leisure MD Pathologist, Electronic Signature ( Signed 02/15/2012) 1 of ADDITIONAL INFORMATION: PROGNOSTIC INDICATORS - ACIS Results IMMUNOHISTOCHEMICAL AND MORPHOMETRIC ANALYSIS BY THE AUTOMATED CELLULAR IMAGING SYSTEM (ACIS) Estrogen Receptor (Negative, <1%): 0%, NEGATIVE Progesterone Receptor (Negative, <1%): 0%, NEGATIVE COMMENT: The negative hormone receptor study(ies) in this case have an internal positive control. All controls stained appropriately Abigail Miyamoto MD Pathologist, Electronic Signature ( Signed 02/22/2012)  ASSESSMENT #1 Stage 1 HER-2 positive left breast cancer ER negative PR negative.  Patient received for neoadjuvant chemotherapy consisting of Taxotere carboplatinum and herceptin as she does want breast conservation.  She understands the risks and benefits of this approach and the side effects of her chemotherapy and Herceptin. She has completed surgery, radiation therapy, and has will continue Adjuvant Herceptin to complete 1 year of herceptin therapy.    #2  She does have thrombocytopenia and we will monitor this  PLAN: #1 patient will proceed with scheduled Herceptin today.  #2 she will return in 3 weeks for next Herceptin.    #3 we  will try to coordinate her B12 injections with her treatment days.  All questions were answered. The patient knows to call the clinic with any problems, questions or concerns. We can certainly see the patient much sooner if necessary.  I spent 25 minutes counseling the patient face to face. The total time spent in the appointment was 30 minutes.  Drue Second, MD Medical/Oncology [facility] [phone] (beeper) [phone] (Office)  11/29/2012, 10:48 AM

## 2012-11-29 NOTE — Patient Instructions (Addendum)
[facility] Discharge Instructions for Patients Receiving Chemotherapy  Today you received the following chemotherapy agents :  Herceptin.  To help prevent nausea and vomiting after your treatment, we encourage you to take your nausea medication as instructed by your physician.   If you develop nausea and vomiting that is not controlled by your nausea medication, call the clinic.   BELOW ARE SYMPTOMS THAT SHOULD BE REPORTED IMMEDIATELY:  *FEVER GREATER THAN 100.5 F  *CHILLS WITH OR WITHOUT FEVER  NAUSEA AND VOMITING THAT IS NOT CONTROLLED WITH YOUR NAUSEA MEDICATION  *UNUSUAL SHORTNESS OF BREATH  *UNUSUAL BRUISING OR BLEEDING  TENDERNESS IN MOUTH AND THROAT WITH OR WITHOUT PRESENCE OF ULCERS  *URINARY PROBLEMS  *BOWEL PROBLEMS  UNUSUAL RASH Items with * indicate a potential emergency and should be followed up as soon as possible.  Feel free to call the clinic you have any questions or concerns. The clinic phone number is [phone].    

## 2012-11-29 NOTE — Progress Notes (Signed)
Patient saw Dr. Welton Flakes today. MD aware of labs; OK to treat, per MD.

## 2012-12-10 ENCOUNTER — Ambulatory Visit (HOSPITAL_COMMUNITY)
Admission: RE | Admit: 2012-12-10 | Discharge: 2012-12-10 | Disposition: A | Discharge status: Home / Self Care | Payer: BC Managed Care – PPO | Source: Ambulatory Visit | Attending: Internal Medicine | Admitting: Internal Medicine

## 2012-12-10 ENCOUNTER — Ambulatory Visit (HOSPITAL_BASED_OUTPATIENT_CLINIC_OR_DEPARTMENT_OTHER)
Admission: RE | Admit: 2012-12-10 | Discharge: 2012-12-10 | Disposition: A | Discharge status: Home / Self Care | Payer: BC Managed Care – PPO | Source: Ambulatory Visit | Attending: Internal Medicine | Admitting: Internal Medicine

## 2012-12-10 VITALS — BP 144/62 | HR 65 | Wt 172.0 lb

## 2012-12-10 DIAGNOSIS — I059 Rheumatic mitral valve disease, unspecified: Secondary | ICD-10-CM | POA: Insufficient documentation

## 2012-12-10 DIAGNOSIS — C50419 Malignant neoplasm of upper-outer quadrant of unspecified female breast: Secondary | ICD-10-CM

## 2012-12-10 DIAGNOSIS — I079 Rheumatic tricuspid valve disease, unspecified: Secondary | ICD-10-CM | POA: Insufficient documentation

## 2012-12-10 DIAGNOSIS — C50412 Malignant neoplasm of upper-outer quadrant of left female breast: Secondary | ICD-10-CM

## 2012-12-10 DIAGNOSIS — Z09 Encounter for follow-up examination after completed treatment for conditions other than malignant neoplasm: Secondary | ICD-10-CM

## 2012-12-10 DIAGNOSIS — I10 Essential (primary) hypertension: Secondary | ICD-10-CM | POA: Insufficient documentation

## 2012-12-10 DIAGNOSIS — I319 Disease of pericardium, unspecified: Secondary | ICD-10-CM | POA: Insufficient documentation

## 2012-12-10 NOTE — Progress Notes (Signed)
Echocardiogram 2D Echocardiogram has been performed.  Margreta Journey 12/10/2012, 12:27 PM

## 2012-12-10 NOTE — Patient Instructions (Addendum)
Follow up in 3 months with an ECHO and Dr Bensimhon 

## 2012-12-10 NOTE — Progress Notes (Signed)
Patient ID: Maria Mckinney, female   DOB: [birth year], 62 y.o.   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   Treated with neoadjuvant chemotherapy consisting of Taxotere/carboplatinum/Herceptin. Plans to continue Herceptin until November 13,2014.   ECHO 02/21/12  EF 60-65% Lateral S' 7.1 05/30/12  EF 55-60% Lateral S' 7.2 08/30/12   EF 55-60% lateral s' 7.3 12/10/12  EF 55% Lateral s' 7.4  She returns for follow up. Overall feeling good. Denies SOB/PND/Orthopnea/edema.  Past Medical History  Diagnosis Date  . HTN (hypertension)   . Fibromyalgia   . GERD (gastroesophageal reflux disease)   . Arthritis   . Anxiety   . Dysrhythmia     irreg hr  . Wears hearing aid     left ear  . 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 PONV (postoperative nausea and vomiting)   . B12 deficiency anemia 10/18/2012    Current Outpatient Prescriptions  Medication Sig Dispense Refill  . bisoprolol-hydrochlorothiazide (ZIAC) 5-6.25 MG per tablet Take 1 tablet by mouth daily.      . cyclobenzaprine (FLEXERIL) 10 MG tablet Take 10 mg by mouth daily as needed for muscle spasms. For muscle pain.      . DULoxetine (CYMBALTA) 60 MG capsule Take 1 capsule (60 mg total) by mouth daily.  30 capsule  3  . esomeprazole (NEXIUM) 40 MG capsule Take 1 capsule (40 mg total) by mouth daily before breakfast.  30 capsule  6  . HYDROcodone-acetaminophen (NORCO/VICODIN) 5-325 MG per tablet Take 1 tablet by mouth every 6 (six) hours as needed for pain.      Marland Kitchen levothyroxine (SYNTHROID, LEVOTHROID) 100 MCG tablet Take 100 mcg by mouth daily.      Marland Kitchen lidocaine-prilocaine (EMLA) cream Apply 1 application topically as needed. For port-a-cath access.      Marland Kitchen LORazepam (ATIVAN) 0.5 MG tablet Take 1 tablet (0.5 mg total) by mouth every 8 (eight) hours as needed for  anxiety.  30 tablet  0  . meloxicam (MOBIC) 15 MG tablet Take 15 mg by mouth daily.      . mometasone (NASONEX) 50 MCG/ACT nasal spray Place 2 sprays into the nose daily.      Marland Kitchen PRESCRIPTION MEDICATION Inject into the vein. Herceptin infusion every 21 days      . pseudoephedrine (SUDAFED) 30 MG tablet Take 30 mg by mouth every 6 (six) hours as needed for congestion.        No current facility-administered medications for this encounter.      PHYSICAL EXAM: Filed Vitals:   12/10/12 1159  BP: 144/62  Pulse: 65   General:  Well appearing. No respiratory difficulty HEENT: normal Neck: supple. no JVD. Carotids 2+ bilat; no bruits. No lymphadenopathy or thryomegaly appreciated. Cor: PMI nondisplaced. Regular rate & rhythm. No rubs, gallops or murmurs. Lungs: clear Abdomen: soft, nontender, nondistended. No hepatosplenomegaly. No bruits or masses. Good bowel sounds. Extremities: no cyanosis, clubbing, rash, edema Neuro: alert & oriented x 3, cranial nerves grossly intact. moves all 4 extremities w/o difficulty. Affect pleasant.   No results found for this or any previous visit (from the past 24 hour(s)). No results found.   ASSESSMENT & PLAN:  1. Cancer of upper-outer quadrant L breast Dr [name] discussed and reviewed ECHO. EF and lateral s' stable. Follow  up in November after her last round of Herceptin schedule November 13.   Patient seen and examined with Tonye Becket, NP. We discussed all aspects of the encounter. I agree with the assessment and plan as stated above.  I reviewed echos personally. EF and Doppler parameters stable. No HF on exam. Continue Herceptin.   Daniel Bensimhon,MD 5:42 PM

## 2012-12-13 ENCOUNTER — Ambulatory Visit: Payer: BC Managed Care – PPO

## 2012-12-20 ENCOUNTER — Telehealth: Payer: Self-pay | Admitting: Oncology

## 2012-12-20 ENCOUNTER — Ambulatory Visit (HOSPITAL_BASED_OUTPATIENT_CLINIC_OR_DEPARTMENT_OTHER): Payer: BC Managed Care – PPO | Admitting: Adult Health

## 2012-12-20 ENCOUNTER — Ambulatory Visit (HOSPITAL_BASED_OUTPATIENT_CLINIC_OR_DEPARTMENT_OTHER): Payer: BC Managed Care – PPO

## 2012-12-20 ENCOUNTER — Telehealth: Payer: Self-pay | Admitting: *Deleted

## 2012-12-20 ENCOUNTER — Encounter: Payer: Self-pay | Admitting: Adult Health

## 2012-12-20 ENCOUNTER — Other Ambulatory Visit (HOSPITAL_BASED_OUTPATIENT_CLINIC_OR_DEPARTMENT_OTHER): Payer: BC Managed Care – PPO | Admitting: Lab

## 2012-12-20 ENCOUNTER — Ambulatory Visit: Payer: BC Managed Care – PPO

## 2012-12-20 VITALS — BP 125/61 | HR 64 | Temp 98.8°F | Resp 20 | Ht 62.0 in | Wt 174.4 lb

## 2012-12-20 DIAGNOSIS — C50419 Malignant neoplasm of upper-outer quadrant of unspecified female breast: Secondary | ICD-10-CM

## 2012-12-20 DIAGNOSIS — Z5112 Encounter for antineoplastic immunotherapy: Secondary | ICD-10-CM

## 2012-12-20 DIAGNOSIS — D696 Thrombocytopenia, unspecified: Secondary | ICD-10-CM

## 2012-12-20 DIAGNOSIS — D519 Vitamin B12 deficiency anemia, unspecified: Secondary | ICD-10-CM

## 2012-12-20 DIAGNOSIS — Z171 Estrogen receptor negative status [ER-]: Secondary | ICD-10-CM

## 2012-12-20 LAB — COMPREHENSIVE METABOLIC PANEL (CC13)
ALT: 26 U/L (ref 0–55)
Alkaline Phosphatase: 91 U/L (ref 40–150)
Glucose: 92 mg/dl (ref 70–140)
Sodium: 141 mEq/L (ref 136–145)
Total Bilirubin: 0.38 mg/dL (ref 0.20–1.20)
Total Protein: 6.5 g/dL (ref 6.4–8.3)

## 2012-12-20 LAB — CBC WITH DIFFERENTIAL/PLATELET
Basophils Absolute: 0 10*3/uL (ref 0.0–0.1)
Eosinophils Absolute: 0.1 10*3/uL (ref 0.0–0.5)
HCT: 31.1 % — ABNORMAL LOW (ref 34.8–46.6)
HGB: 10.8 g/dL — ABNORMAL LOW (ref 11.6–15.9)
LYMPH%: 33.3 % (ref 14.0–49.7)
MCV: 88.4 fL (ref 79.5–101.0)
MONO%: 10.7 % (ref 0.0–14.0)
NEUT#: 1.6 10*3/uL (ref 1.5–6.5)
Platelets: 91 10*3/uL — ABNORMAL LOW (ref 145–400)

## 2012-12-20 MED ORDER — SODIUM CHLORIDE 0.9 % IJ SOLN
10.0000 mL | INTRAMUSCULAR | Status: DC | PRN
  Administered 2012-12-20: 10 mL via INTRAVENOUS
  Filled 2012-12-20: qty 10

## 2012-12-20 MED ORDER — TRASTUZUMAB CHEMO INJECTION 440 MG
6.0000 mg/kg | Freq: Once | INTRAVENOUS | Status: AC
  Administered 2012-12-20: 462 mg via INTRAVENOUS
  Filled 2012-12-20: qty 22

## 2012-12-20 MED ORDER — SODIUM CHLORIDE 0.9 % IV SOLN
INTRAVENOUS | Status: DC
  Administered 2012-12-20: 14:00:00 via INTRAVENOUS

## 2012-12-20 MED ORDER — CYANOCOBALAMIN 1000 MCG/ML IJ SOLN
1000.0000 ug | Freq: Once | INTRAMUSCULAR | Status: AC
  Administered 2012-12-20: 1000 ug via INTRAMUSCULAR

## 2012-12-20 MED ORDER — HEPARIN SOD (PORK) LOCK FLUSH 100 UNIT/ML IV SOLN
500.0000 [IU] | Freq: Once | INTRAVENOUS | Status: AC
  Administered 2012-12-20: 500 [IU] via INTRAVENOUS
  Filled 2012-12-20: qty 5

## 2012-12-20 MED ORDER — ACETAMINOPHEN 325 MG PO TABS
650.0000 mg | ORAL_TABLET | Freq: Once | ORAL | Status: AC
  Administered 2012-12-20: 650 mg via ORAL

## 2012-12-20 NOTE — Patient Instructions (Addendum)

## 2012-12-20 NOTE — Telephone Encounter (Signed)
, °

## 2012-12-20 NOTE — Patient Instructions (Addendum)
Doing well.  Proceed with Herceptin.  Please call us if you have any questions or concerns.    

## 2012-12-20 NOTE — Telephone Encounter (Signed)
Per staff phone call and POF I have schedueld appts.  JMW  

## 2012-12-20 NOTE — Progress Notes (Signed)
Maria Mckinney [id number] Apr 01, 1951 62 y.o. 12/20/2012 1:28 PM  CC  [name], MD Dr. [name] [address] [place] [address] Dr. [name] Dr. [name] Dr. 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  PRIOR THERAPY:  #1Patient was originally seen in the multidisciplinary breast clinic for new diagnosis of left breast cancer that was 8mm in the upper outer quadrant of the left breast.  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  There was an additional 4 cm area of normal signal.  Patient did not get an MRI secondary to cochlear implant.  She desires breast conservation.    #2 Patient is s/p port-a-cath placement for neoadjuvant chemotherapy consisting of Taxotere/carboplatinum/Herceptin.  She will begin cycle #1 on 02/29/2012.  She will receive Taxotere and carboplatinum every 21 days with Herceptin giving weekly.  She will receive a total of 4 cycles of Taxotere and Carboplatinum--which completed on 05/03/12.    #3 Herceptin every 3 weeks beginning 05/23/12.  #4 Lumpectomy by Dr. Dwain Sarna on 06/27/2012 showed complete pathologic response to neoadjuvant chemotherapy.  Sentinel nodes were negative.  #5 Radiation therapy from on 08/14/12 - 09/28/12.  CURRENT THERAPY: Herceptin q3 weeks  INTERVAL HISTORY: [name] is here for her q3 week herceptin.  She is doing well today.  She denies fevers, chills, nausea, vomiting, constipation, diarrhea, numbness, chest pain, dyspnea on exertion, chest pain or any other concerns.    Past Medical History: Past Medical History  Diagnosis Date  . HTN (hypertension)   . Fibromyalgia   . GERD (gastroesophageal reflux disease)   . Arthritis   . Anxiety   . Dysrhythmia     irreg hr  . Wears hearing aid     left ear  . Cochlear  implant in place     right  . PONV (postoperative nausea and vomiting)   . B12 deficiency anemia 10/18/2012    Past Surgical History: Past Surgical History  Procedure Laterality Date  . Appendectomy    . Combined hysteroscopy diagnostic / d&c    . Cochlear implant    . Portacath placement  02/22/2012    Procedure: INSERTION PORT-A-CATH;  Surgeon: [name], MD;  Location: [facility];  Service: General;  Laterality: Right;  . Breast lumpectomy with needle localization and axillary sentinel lymph node bx Left 06/27/2012    Procedure: BREAST LUMPECTOMY WITH NEEDLE LOCALIZATION AND AXILLARY SENTINEL LYMPH NODE BIOPSY;  Surgeon: [name], MD;  Location: [facility] OR;  Service: General;  Laterality: Left;  left breast wire localized at [facility] lumpectomy with left axillary sentinel node biopsy nuclear med 1400    Family History: Family History  Problem Relation Age of Onset  . Cancer Mother     "tumor of face removed"  . Cancer Father     kidney removed  . Cancer Maternal Aunt     stomach & Pancreatic  . Cancer Paternal Uncle     pancreatic    Social History History  Substance Use Topics  . Smoking status: Never Smoker   . Smokeless tobacco: Never Used  . Alcohol Use: 0.0 oz/week    0-1 Glasses of wine per week    Allergies: Allergies  Allergen Reactions  . Acyclovir And Related   . Ciprofloxacin Hcl Rash  . Bactrim [Sulfamethoxazole W-Trimethoprim]     Current  Medications: Current Outpatient Prescriptions  Medication Sig Dispense Refill  . bisoprolol-hydrochlorothiazide (ZIAC) 5-6.25 MG per tablet Take 1 tablet by mouth daily.      . cyclobenzaprine (FLEXERIL) 10 MG tablet Take 10 mg by mouth daily as needed for muscle spasms. For muscle pain.      . DULoxetine (CYMBALTA) 60 MG capsule Take 1 capsule (60 mg total) by mouth daily.  30 capsule  3  . esomeprazole (NEXIUM) 40 MG capsule Take 1 capsule (40 mg total) by mouth daily before  breakfast.  30 capsule  6  . HYDROcodone-acetaminophen (NORCO/VICODIN) 5-325 MG per tablet Take 1 tablet by mouth every 6 (six) hours as needed for pain.      Marland Kitchen levothyroxine (SYNTHROID, LEVOTHROID) 100 MCG tablet Take 100 mcg by mouth daily.      Marland Kitchen lidocaine-prilocaine (EMLA) cream Apply 1 application topically as needed. For port-a-cath access.      Marland Kitchen LORazepam (ATIVAN) 0.5 MG tablet Take 1 tablet (0.5 mg total) by mouth every 8 (eight) hours as needed for anxiety.  30 tablet  0  . meloxicam (MOBIC) 15 MG tablet Take 15 mg by mouth daily.      . mometasone (NASONEX) 50 MCG/ACT nasal spray Place 2 sprays into the nose daily.      Marland Kitchen PRESCRIPTION MEDICATION Inject into the vein. Herceptin infusion every 21 days      . pseudoephedrine (SUDAFED) 30 MG tablet Take 30 mg by mouth every 6 (six) hours as needed for congestion.       . ranitidine (ZANTAC) 300 MG tablet Take 1 tablet by mouth daily.       No current facility-administered medications for this visit.    Health Maintenance:  ColonoscopyShe has never had a colonoscopy Bone DensityBone density about 10-12 years ago Last PAP smearLast Pap smear was in 2012   REVIEW OF SYSTEMS: General: fatigue (-), night sweats (-), fever (-), pain (-) Lymph: palpable nodes (-) HEENT: vision changes (-), mucositis (-), gum bleeding (-), epistaxis (-) Cardiovascular: chest pain (-), palpitations (-) Pulmonary: shortness of breath (-), dyspnea on exertion (-), cough (-), hemoptysis (-) GI:  Early satiety (-), melena (-), dysphagia (-), nausea/vomiting (-), diarrhea (-) GU: dysuria (-), hematuria (-), incontinence (-) Musculoskeletal: joint swelling (-), joint pain (-), back pain (-) Neuro: weakness (-), numbness (-), headache (-), confusion (-) Skin: Rash (-), lesions (-), dryness (-) Psych: depression (-), suicidal/homicidal ideation (-), feeling of hopelessness (-)   PHYSICAL EXAMINATION: Blood pressure 125/61, pulse 64, temperature 98.8 F  (37.1 C), temperature source Oral, resp. rate 20, height 5\' 2"  (1.575 m), weight 174 lb 6.4 oz (79.107 kg). General: Patient is a well appearing female in no acute distress HEENT: PERRLA, sclerae anicteric no conjunctival pallor, MMM,  Neck: supple, no palpable adenopathy Lungs: clear to auscultation bilaterally, no wheezes, rhonchi, or rales Cardiovascular: regular rate rhythm, S1, S2, no murmurs, rubs or gallops,  Abdomen: Soft, non-tender, non-distended, normoactive bowel sounds, no HSM Extremities: warm and well perfused, no clubbing, cyanosis, or edema Skin: No rashes or lesions Neuro: Non-focal ECOG PERFORMANCE STATUS: 1 - Symptomatic but completely ambulatory Breasts: left breast lumpectomy site healing well, clean dry and intact, no nodularity, right breast: no masses or nodularity Left breast with vesicular lesions in linear pattern along T6 STUDIES/RESULTS: No results found.   LABS:    Chemistry      Component Value Date/Time   NA 140 11/29/2012 0949   NA 139 06/21/2012 1235   K  3.6 11/29/2012 0949   K 4.1 06/21/2012 1235   CL 102 10/18/2012 1353   CL 100 06/21/2012 1235   CO2 28 11/29/2012 0949   CO2 27 06/21/2012 1235   BUN 16.7 11/29/2012 0949   BUN 14 06/21/2012 1235   CREATININE 1.2* 11/29/2012 0949   CREATININE 1.24* 06/21/2012 1235      Component Value Date/Time   CALCIUM 9.5 11/29/2012 0949   CALCIUM 7.4* 06/21/2012 1235   ALKPHOS 92 11/29/2012 0949   ALKPHOS 59 05/10/2012 1909   AST 18 11/29/2012 0949   AST 30 05/10/2012 1909   ALT 18 11/29/2012 0949   ALT 69* 05/10/2012 1909   BILITOT 0.39 11/29/2012 0949   BILITOT 3.9* 05/10/2012 1909      Lab Results  Component Value Date   WBC 3.0* 12/20/2012   HGB 10.8* 12/20/2012   HCT 31.1* 12/20/2012   MCV 88.4 12/20/2012   PLT 91* 12/20/2012      PATHOLOGY:  ADDITIONAL INFORMATION: PROGNOSTIC INDICATORS - ACIS Results IMMUNOHISTOCHEMICAL AND MORPHOMETRIC ANALYSIS BY THE AUTOMATED CELLULAR IMAGING SYSTEM (ACIS) Estrogen  Receptor (Negative, <1%): 0%, NEGATIVE Progesterone Receptor (Negative, <1%): 0%, NEGATIVE Proliferation Marker Ki67 by M IB-1 (Low<20%): 98% All controls stained appropriately Pecola Leisure MD Pathologist, Electronic Signature ( Signed 02/15/2012) CHROMOGENIC IN-SITU HYBRIDIZATION Interpretation: HER2/NEU BY CISH - SHOWS AMPLIFICATION BY CISH ANALYSIS. THE RATIO OF HER2: CEP 17 SIGNALS WAS 2.55. 40 TUMOR CELLS WERE SCORED. OF NOTE, THE TUMOR APPEARS TO SHOW HETEROGENEITY IN REGARDS TO HER2 EXPRESSION. Reference range: Ratio: HER2:CEP17 < 1.8 gene amplification not observed Ratio: HER2:CEP 17 1.8-2.2 - equivocal result Ratio: HER2:CEP17 > 2.2 - gene amplification observed Pecola Leisure MD Pathologist, Electronic Signature ( Signed 02/15/2012) 1 of ADDITIONAL INFORMATION: PROGNOSTIC INDICATORS - ACIS Results IMMUNOHISTOCHEMICAL AND MORPHOMETRIC ANALYSIS BY THE AUTOMATED CELLULAR IMAGING SYSTEM (ACIS) Estrogen Receptor (Negative, <1%): 0%, NEGATIVE Progesterone Receptor (Negative, <1%): 0%, NEGATIVE COMMENT: The negative hormone receptor study(ies) in this case have an internal positive control. All controls stained appropriately Abigail Miyamoto MD Pathologist, Electronic Signature ( Signed 02/22/2012)  ASSESSMENT #1 Stage 1 HER-2 positive left breast cancer ER negative PR negative.  Patient received for neoadjuvant chemotherapy consisting of Taxotere carboplatinum and herceptin as she does want breast conservation.  She understands the risks and benefits of this approach and the side effects of her chemotherapy and Herceptin. She has completed surgery, radiation therapy, and has will continue Adjuvant Herceptin to complete 1 year of herceptin therapy.    #2  She does have thrombocytopenia and we will monitor this  PLAN: #1 Doing well.  Patient will proceed with Herceptin today.    #2 she will return in 3 weeks for next Herceptin.    #3 we will try to coordinate her B12  injections with her treatment days.  All questions were answered. The patient knows to call the clinic with any problems, questions or concerns. We can certainly see the patient much sooner if necessary.  I spent 25 minutes counseling the patient face to face. The total time spent in the appointment was 30 minutes.  Cherie Ouch Lyn Hollingshead, NP Medical Oncology [facility] Phone: [phone] 12/20/2012, 1:28 PM

## 2013-01-10 ENCOUNTER — Telehealth: Payer: Self-pay | Admitting: *Deleted

## 2013-01-10 ENCOUNTER — Ambulatory Visit: Payer: BC Managed Care – PPO

## 2013-01-10 ENCOUNTER — Ambulatory Visit (HOSPITAL_BASED_OUTPATIENT_CLINIC_OR_DEPARTMENT_OTHER): Payer: BC Managed Care – PPO | Admitting: Adult Health

## 2013-01-10 ENCOUNTER — Ambulatory Visit (HOSPITAL_BASED_OUTPATIENT_CLINIC_OR_DEPARTMENT_OTHER): Payer: BC Managed Care – PPO

## 2013-01-10 ENCOUNTER — Other Ambulatory Visit (HOSPITAL_BASED_OUTPATIENT_CLINIC_OR_DEPARTMENT_OTHER): Payer: BC Managed Care – PPO | Admitting: Lab

## 2013-01-10 ENCOUNTER — Encounter: Payer: Self-pay | Admitting: Adult Health

## 2013-01-10 VITALS — BP 124/64 | HR 72 | Temp 97.5°F | Resp 18 | Ht 62.0 in | Wt 175.5 lb

## 2013-01-10 DIAGNOSIS — C50419 Malignant neoplasm of upper-outer quadrant of unspecified female breast: Secondary | ICD-10-CM

## 2013-01-10 DIAGNOSIS — C50412 Malignant neoplasm of upper-outer quadrant of left female breast: Secondary | ICD-10-CM

## 2013-01-10 DIAGNOSIS — Z171 Estrogen receptor negative status [ER-]: Secondary | ICD-10-CM

## 2013-01-10 DIAGNOSIS — D696 Thrombocytopenia, unspecified: Secondary | ICD-10-CM

## 2013-01-10 DIAGNOSIS — D519 Vitamin B12 deficiency anemia, unspecified: Secondary | ICD-10-CM

## 2013-01-10 DIAGNOSIS — Z5112 Encounter for antineoplastic immunotherapy: Secondary | ICD-10-CM

## 2013-01-10 LAB — COMPREHENSIVE METABOLIC PANEL (CC13)
ALT: 22 U/L (ref 0–55)
AST: 21 U/L (ref 5–34)
Alkaline Phosphatase: 87 U/L (ref 40–150)
Calcium: 9.4 mg/dL (ref 8.4–10.4)
Chloride: 105 mEq/L (ref 98–109)
Creatinine: 1.1 mg/dL (ref 0.6–1.1)

## 2013-01-10 LAB — CBC WITH DIFFERENTIAL/PLATELET
Eosinophils Absolute: 0.1 10*3/uL (ref 0.0–0.5)
HCT: 31.5 % — ABNORMAL LOW (ref 34.8–46.6)
LYMPH%: 42.5 % (ref 14.0–49.7)
MCV: 87.3 fL (ref 79.5–101.0)
MONO%: 8.4 % (ref 0.0–14.0)
NEUT#: 1.2 10*3/uL — ABNORMAL LOW (ref 1.5–6.5)
NEUT%: 46.4 % (ref 38.4–76.8)
Platelets: 86 10*3/uL — ABNORMAL LOW (ref 145–400)
RBC: 3.61 10*6/uL — ABNORMAL LOW (ref 3.70–5.45)
nRBC: 0 % (ref 0–0)

## 2013-01-10 MED ORDER — TRASTUZUMAB CHEMO INJECTION 440 MG
6.0000 mg/kg | Freq: Once | INTRAVENOUS | Status: AC
  Administered 2013-01-10: 462 mg via INTRAVENOUS
  Filled 2013-01-10: qty 22

## 2013-01-10 MED ORDER — SODIUM CHLORIDE 0.9 % IJ SOLN
10.0000 mL | INTRAMUSCULAR | Status: DC | PRN
  Administered 2013-01-10: 10 mL
  Filled 2013-01-10: qty 10

## 2013-01-10 MED ORDER — ACETAMINOPHEN 325 MG PO TABS
650.0000 mg | ORAL_TABLET | Freq: Once | ORAL | Status: AC
  Administered 2013-01-10: 650 mg via ORAL

## 2013-01-10 MED ORDER — CYANOCOBALAMIN 1000 MCG/ML IJ SOLN
INTRAMUSCULAR | Status: AC
  Administered 2013-01-10: 1000 ug via INTRAMUSCULAR
  Filled 2013-01-10: qty 1

## 2013-01-10 MED ORDER — CYANOCOBALAMIN 1000 MCG/ML IJ SOLN
1000.0000 ug | Freq: Once | INTRAMUSCULAR | Status: AC
  Administered 2013-01-10: 1000 ug via INTRAMUSCULAR

## 2013-01-10 MED ORDER — HEPARIN SOD (PORK) LOCK FLUSH 100 UNIT/ML IV SOLN
500.0000 [IU] | Freq: Once | INTRAVENOUS | Status: AC | PRN
  Administered 2013-01-10: 500 [IU]
  Filled 2013-01-10: qty 5

## 2013-01-10 MED ORDER — DIPHENHYDRAMINE HCL 25 MG PO CAPS: 50.0000 mg | ORAL_CAPSULE | Freq: Once | ORAL | Status: DC

## 2013-01-10 MED ORDER — ACETAMINOPHEN 325 MG PO TABS
ORAL_TABLET | ORAL | Status: AC
  Filled 2013-01-10: qty 2

## 2013-01-10 MED ORDER — SODIUM CHLORIDE 0.9 % IV SOLN
Freq: Once | INTRAVENOUS | Status: AC
  Administered 2013-01-10: 13:00:00 via INTRAVENOUS

## 2013-01-10 NOTE — Patient Instructions (Signed)
Doing well.  Proceed with Herceptin.  Please call us if you have any questions or concerns.    

## 2013-01-10 NOTE — Telephone Encounter (Signed)
Per staff message and POF I have scheduled appts.  JMW  

## 2013-01-10 NOTE — Patient Instructions (Addendum)
[facility] Discharge Instructions for Patients Receiving Chemotherapy  Today you received the following chemotherapy agents: Herceptin   To help prevent nausea and vomiting after your treatment, we encourage you to take your nausea medication as indicated by your physician.   If you develop nausea and vomiting that is not controlled by your nausea medication, call the clinic.   BELOW ARE SYMPTOMS THAT SHOULD BE REPORTED IMMEDIATELY:  *FEVER GREATER THAN 100.5 F  *CHILLS WITH OR WITHOUT FEVER  NAUSEA AND VOMITING THAT IS NOT CONTROLLED WITH YOUR NAUSEA MEDICATION  *UNUSUAL SHORTNESS OF BREATH  *UNUSUAL BRUISING OR BLEEDING  TENDERNESS IN MOUTH AND THROAT WITH OR WITHOUT PRESENCE OF ULCERS  *URINARY PROBLEMS  *BOWEL PROBLEMS  UNUSUAL RASH Items with * indicate a potential emergency and should be followed up as soon as possible.  Feel free to call the clinic you have any questions or concerns. The clinic phone number is [phone].

## 2013-01-10 NOTE — Telephone Encounter (Signed)
Made pt aware that tx will follow on 10/23. i emailed MW .Marland KitchenMarland Kitchentd

## 2013-01-10 NOTE — Progress Notes (Signed)
Maria Mckinney [id number] 10/28/1950 62 y.o. 01/10/2013 12:29 PM  CC  Maria Mckinney L, MD Dr. [name] [address] [place] [address] Dr. [name] Dr. [name] Dr. 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  PRIOR THERAPY:  #1Patient was originally seen in the multidisciplinary breast clinic for new diagnosis of left breast cancer that was 8mm in the upper outer quadrant of the left breast.  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  There was an additional 4 cm area of normal signal.  Patient did not get an MRI secondary to cochlear implant.  She desires breast conservation.    #2 Patient is s/p port-a-cath placement for neoadjuvant chemotherapy consisting of Taxotere/carboplatinum/Herceptin.  She began cycle #1 on 02/29/2012.  She received Taxotere and carboplatinum every 21 days with Herceptin giving weekly.  She completed a total of 4 cycles of Taxotere and Carboplatinum on 05/03/12.    #3 Herceptin every 3 weeks beginning 05/23/12.  #4 Lumpectomy by Dr. Dwain Sarna on 06/27/2012 showed complete pathologic response to neoadjuvant chemotherapy.  Sentinel nodes were negative.  #5 Patient underwent radiation therapy from on 08/14/12 - 09/28/12.  She received this in [place].    CURRENT THERAPY: Herceptin q3 weeks  INTERVAL HISTORY: Ms Maria Mckinney is here for evaluation prior to q3 week herceptin. She is doing well today.  She is fatigued.  She had been doing well and slowly recovering however her husband recently was thrown from a tractor and has been hospitalized with a leg wound.  She's had to do more work around the new house she moved into.  She denies fevers, chills, nausea, vomiting, constipation, diarrhea, numbness, chest pain, palpitations, shortness of breath, DOE, orthopnea, or  further concerns.    Past Medical History: Past Medical History  Diagnosis Date  . HTN (hypertension)   . Fibromyalgia   . GERD (gastroesophageal reflux disease)   . Arthritis   . Anxiety   . Dysrhythmia     irreg hr  . Wears hearing aid     left ear  . 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 PONV (postoperative nausea and vomiting)   . B12 deficiency anemia 10/18/2012    Past Surgical History: Past Surgical History  Procedure Laterality Date  . Appendectomy    . Combined hysteroscopy diagnostic / d&c    . Cochlear implant    . Portacath placement  02/22/2012    Procedure: INSERTION PORT-A-CATH;  Surgeon: [name], MD;  Location: [facility];  Service: General;  Laterality: Right;  . Breast lumpectomy with needle localization and axillary sentinel lymph node bx Left 06/27/2012    Procedure: BREAST LUMPECTOMY WITH NEEDLE LOCALIZATION AND AXILLARY SENTINEL LYMPH NODE BIOPSY;  Surgeon: [name], MD;  Location: [facility] OR;  Service: General;  Laterality: Left;  left breast wire localized at [facility] lumpectomy with left axillary sentinel node biopsy nuclear med 1400    Family History: Family History  Problem Relation Age of Onset  . Cancer Mother     "tumor of face removed"  . Cancer Father     kidney removed  . Cancer Maternal Aunt     stomach & Pancreatic  . Cancer Paternal Uncle     pancreatic    Social History History  Substance Use Topics  . Smoking status: Never Smoker   .  Smokeless tobacco: Never Used  . Alcohol Use: 0.0 oz/week    0-1 Glasses of wine per week    Allergies: Allergies  Allergen Reactions  . Acyclovir And Related   . Ciprofloxacin Hcl Rash  . Bactrim [Sulfamethoxazole W-Trimethoprim]     Current Medications: Current Outpatient Prescriptions  Medication Sig Dispense Refill  . bisoprolol-hydrochlorothiazide (ZIAC) 5-6.25 MG per tablet Take 1 tablet by mouth daily.      . cyclobenzaprine (FLEXERIL) 10 MG tablet  Take 10 mg by mouth daily as needed for muscle spasms. For muscle pain.      . DULoxetine (CYMBALTA) 60 MG capsule Take 1 capsule (60 mg total) by mouth daily.  30 capsule  3  . esomeprazole (NEXIUM) 40 MG capsule Take 1 capsule (40 mg total) by mouth daily before breakfast.  30 capsule  6  . HYDROcodone-acetaminophen (NORCO/VICODIN) 5-325 MG per tablet Take 1 tablet by mouth every 6 (six) hours as needed for pain.      Marland Kitchen levothyroxine (SYNTHROID, LEVOTHROID) 100 MCG tablet Take 100 mcg by mouth daily.      Marland Kitchen lidocaine-prilocaine (EMLA) cream Apply 1 application topically as needed. For port-a-cath access.      Marland Kitchen LORazepam (ATIVAN) 0.5 MG tablet Take 1 tablet (0.5 mg total) by mouth every 8 (eight) hours as needed for anxiety.  30 tablet  0  . meloxicam (MOBIC) 15 MG tablet Take 15 mg by mouth daily.      . mometasone (NASONEX) 50 MCG/ACT nasal spray Place 2 sprays into the nose daily.      Marland Kitchen PRESCRIPTION MEDICATION Inject into the vein. Herceptin infusion every 21 days      . ranitidine (ZANTAC) 300 MG tablet Take 1 tablet by mouth daily.      . pseudoephedrine (SUDAFED) 30 MG tablet Take 30 mg by mouth every 6 (six) hours as needed for congestion.        No current facility-administered medications for this visit.    Health Maintenance:  ColonoscopyShe has never had a colonoscopy Bone DensityBone density about 10-12 years ago Last PAP smearLast Pap smear was in 2012   REVIEW OF SYSTEMS: A 10 point review of systems was conducted and is otherwise negative except for what is noted above.    PHYSICAL EXAMINATION: Blood pressure 124/64, pulse 72, temperature 97.5 F (36.4 C), temperature source Oral, resp. rate 18, height 5\' 2"  (1.575 m), weight 175 lb 8 oz (79.606 kg). General: Patient is a well appearing female in no acute distress HEENT: PERRLA, sclerae anicteric no conjunctival pallor, MMM,  Neck: supple, no palpable adenopathy Lungs: clear to auscultation bilaterally, no wheezes,  rhonchi, or rales Cardiovascular: regular rate rhythm, S1, S2, no murmurs, rubs or gallops,  Abdomen: Soft, non-tender, non-distended, normoactive bowel sounds, no HSM Extremities: warm and well perfused, no clubbing, cyanosis, or edema Skin: No rashes or lesions Neuro: Non-focal ECOG PERFORMANCE STATUS: 1 - Symptomatic but completely ambulatory Breasts: left breast lumpectomy site healing well, clean dry and intact, no nodularity, right breast: no masses or nodularity STUDIES/RESULTS: No results found.   LABS:    Chemistry      Component Value Date/Time   NA 141 12/20/2012 1308   NA 139 06/21/2012 1235   K 3.8 12/20/2012 1308   K 4.1 06/21/2012 1235   CL 102 10/18/2012 1353   CL 100 06/21/2012 1235   CO2 25 12/20/2012 1308   CO2 27 06/21/2012 1235   BUN 19.3 12/20/2012 1308   BUN 14  06/21/2012 1235   CREATININE 1.4* 12/20/2012 1308   CREATININE 1.24* 06/21/2012 1235      Component Value Date/Time   CALCIUM 9.5 12/20/2012 1308   CALCIUM 7.4* 06/21/2012 1235   ALKPHOS 91 12/20/2012 1308   ALKPHOS 59 05/10/2012 1909   AST 23 12/20/2012 1308   AST 30 05/10/2012 1909   ALT 26 12/20/2012 1308   ALT 69* 05/10/2012 1909   BILITOT 0.38 12/20/2012 1308   BILITOT 3.9* 05/10/2012 1909      Lab Results  Component Value Date   WBC 2.6* 01/10/2013   HGB 11.2* 01/10/2013   HCT 31.5* 01/10/2013   MCV 87.3 01/10/2013   PLT 86* 01/10/2013   PATHOLOGY:  ADDITIONAL INFORMATION: PROGNOSTIC INDICATORS - ACIS Results IMMUNOHISTOCHEMICAL AND MORPHOMETRIC ANALYSIS BY THE AUTOMATED CELLULAR IMAGING SYSTEM (ACIS) Estrogen Receptor (Negative, <1%): 0%, NEGATIVE Progesterone Receptor (Negative, <1%): 0%, NEGATIVE Proliferation Marker Ki67 by M IB-1 (Low<20%): 98% All controls stained appropriately Pecola Leisure MD Pathologist, Electronic Signature ( Signed 02/15/2012) CHROMOGENIC IN-SITU HYBRIDIZATION Interpretation: HER2/NEU BY CISH - SHOWS AMPLIFICATION BY CISH ANALYSIS. THE RATIO OF HER2: CEP 17 SIGNALS  WAS 2.55. 40 TUMOR CELLS WERE SCORED. OF NOTE, THE TUMOR APPEARS TO SHOW HETEROGENEITY IN REGARDS TO HER2 EXPRESSION. Reference range: Ratio: HER2:CEP17 < 1.8 gene amplification not observed Ratio: HER2:CEP 17 1.8-2.2 - equivocal result Ratio: HER2:CEP17 > 2.2 - gene amplification observed Pecola Leisure MD Pathologist, Electronic Signature ( Signed 02/15/2012) 1 of ADDITIONAL INFORMATION: PROGNOSTIC INDICATORS - ACIS Results IMMUNOHISTOCHEMICAL AND MORPHOMETRIC ANALYSIS BY THE AUTOMATED CELLULAR IMAGING SYSTEM (ACIS) Estrogen Receptor (Negative, <1%): 0%, NEGATIVE Progesterone Receptor (Negative, <1%): 0%, NEGATIVE COMMENT: The negative hormone receptor study(ies) in this case have an internal positive control. All controls stained appropriately Abigail Miyamoto MD Pathologist, Electronic Signature ( Signed 02/22/2012)  ASSESSMENT #1 Stage 1 HER-2 positive left breast cancer ER negative PR negative.  Patient received for neoadjuvant chemotherapy consisting of Taxotere carboplatinum and herceptin as she does want breast conservation.  She understands the risks and benefits of this approach and the side effects of her chemotherapy and Herceptin. She has completed chemotherapy, surgery, radiation therapy, and  will continue Adjuvant Herceptin to complete 1 year of herceptin therapy.    #2  She does have thrombocytopenia and we will monitor this  PLAN:  #1 Doing well.  Patient will proceed with Herceptin therapy today.    #2 She receives B12 injections and will continue this monthly.    #3. She will return in 3 weeks for labs and evaluation.   All questions were answered. The patient knows to call the clinic with any problems, questions or concerns. We can certainly see the patient much sooner if necessary.  I spent 25 minutes counseling the patient face to face. The total time spent in the appointment was 30 minutes.  Cherie Ouch Lyn Hollingshead, NP Medical Oncology [facility] Phone: [phone] 01/10/2013, 12:29 PM

## 2013-01-31 ENCOUNTER — Ambulatory Visit (HOSPITAL_BASED_OUTPATIENT_CLINIC_OR_DEPARTMENT_OTHER): Payer: BC Managed Care – PPO | Admitting: Oncology

## 2013-01-31 ENCOUNTER — Encounter: Payer: Self-pay | Admitting: Oncology

## 2013-01-31 ENCOUNTER — Ambulatory Visit (HOSPITAL_BASED_OUTPATIENT_CLINIC_OR_DEPARTMENT_OTHER): Payer: BC Managed Care – PPO

## 2013-01-31 ENCOUNTER — Other Ambulatory Visit (HOSPITAL_BASED_OUTPATIENT_CLINIC_OR_DEPARTMENT_OTHER): Payer: BC Managed Care – PPO

## 2013-01-31 VITALS — BP 126/71 | HR 70 | Temp 98.4°F | Resp 20 | Ht 62.0 in | Wt 175.7 lb

## 2013-01-31 DIAGNOSIS — C50419 Malignant neoplasm of upper-outer quadrant of unspecified female breast: Secondary | ICD-10-CM

## 2013-01-31 DIAGNOSIS — Z171 Estrogen receptor negative status [ER-]: Secondary | ICD-10-CM

## 2013-01-31 DIAGNOSIS — C50412 Malignant neoplasm of upper-outer quadrant of left female breast: Secondary | ICD-10-CM

## 2013-01-31 DIAGNOSIS — D696 Thrombocytopenia, unspecified: Secondary | ICD-10-CM

## 2013-01-31 DIAGNOSIS — Z5112 Encounter for antineoplastic immunotherapy: Secondary | ICD-10-CM

## 2013-01-31 LAB — CBC WITH DIFFERENTIAL/PLATELET
BASO%: 0.3 % (ref 0.0–2.0)
EOS%: 3.4 % (ref 0.0–7.0)
HCT: 32.9 % — ABNORMAL LOW (ref 34.8–46.6)
LYMPH%: 41.8 % (ref 14.0–49.7)
MCH: 30.8 pg (ref 25.1–34.0)
MCHC: 35.3 g/dL (ref 31.5–36.0)
NEUT%: 44.3 % (ref 38.4–76.8)
Platelets: 97 10*3/uL — ABNORMAL LOW (ref 145–400)
RBC: 3.77 10*6/uL (ref 3.70–5.45)

## 2013-01-31 LAB — COMPREHENSIVE METABOLIC PANEL (CC13)
ALT: 29 U/L (ref 0–55)
AST: 23 U/L (ref 5–34)
Alkaline Phosphatase: 93 U/L (ref 40–150)
Creatinine: 1.2 mg/dL — ABNORMAL HIGH (ref 0.6–1.1)
Sodium: 141 mEq/L (ref 136–145)
Total Bilirubin: 0.42 mg/dL (ref 0.20–1.20)

## 2013-01-31 LAB — VITAMIN B12: Vitamin B-12: 283 pg/mL (ref 211–911)

## 2013-01-31 MED ORDER — DIPHENHYDRAMINE HCL 25 MG PO CAPS: 50.0000 mg | ORAL_CAPSULE | Freq: Once | ORAL | Status: DC

## 2013-01-31 MED ORDER — ACETAMINOPHEN 325 MG PO TABS
ORAL_TABLET | ORAL | Status: AC
  Filled 2013-01-31: qty 2

## 2013-01-31 MED ORDER — TRASTUZUMAB CHEMO INJECTION 440 MG
6.0000 mg/kg | Freq: Once | INTRAVENOUS | Status: AC
  Administered 2013-01-31: 462 mg via INTRAVENOUS
  Filled 2013-01-31: qty 22

## 2013-01-31 MED ORDER — ACETAMINOPHEN 325 MG PO TABS
650.0000 mg | ORAL_TABLET | Freq: Once | ORAL | Status: AC
  Administered 2013-01-31: 650 mg via ORAL

## 2013-01-31 MED ORDER — DIPHENHYDRAMINE HCL 25 MG PO CAPS
ORAL_CAPSULE | ORAL | Status: AC
  Filled 2013-01-31: qty 2

## 2013-01-31 MED ORDER — SODIUM CHLORIDE 0.9 % IJ SOLN
10.0000 mL | INTRAMUSCULAR | Status: DC | PRN
  Administered 2013-01-31: 10 mL
  Filled 2013-01-31: qty 10

## 2013-01-31 MED ORDER — HEPARIN SOD (PORK) LOCK FLUSH 100 UNIT/ML IV SOLN
500.0000 [IU] | Freq: Once | INTRAVENOUS | Status: AC | PRN
  Administered 2013-01-31: 500 [IU]
  Filled 2013-01-31: qty 5

## 2013-01-31 MED ORDER — SODIUM CHLORIDE 0.9 % IV SOLN
Freq: Once | INTRAVENOUS | Status: AC
  Administered 2013-01-31: 11:00:00 via INTRAVENOUS

## 2013-01-31 NOTE — Progress Notes (Signed)
AYAT DRENNING [id number] 12-Sep-1950 62 y.o. 01/31/2013 12:06 PM  CC  [name], MD Dr. [name] [address] [place] [address] Dr. [name] Dr. [name] Dr. 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  PRIOR THERAPY:  #1Patient was originally seen in the multidisciplinary breast clinic for new diagnosis of left breast cancer that was 8mm in the upper outer quadrant of the left breast.  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  There was an additional 4 cm area of normal signal.  Patient did not get an MRI secondary to cochlear implant.  She desires breast conservation.    #2 Patient is s/p port-a-cath placement for neoadjuvant chemotherapy consisting of Taxotere/carboplatinum/Herceptin.  She began cycle #1 on 02/29/2012.  She received Taxotere and carboplatinum every 21 days with Herceptin giving weekly.  She completed a total of 4 cycles of Taxotere and Carboplatinum on 05/03/12.    #3 Herceptin every 3 weeks beginning 05/23/12.  #4 Lumpectomy by Dr. Dwain Sarna on 06/27/2012 showed complete pathologic response to neoadjuvant chemotherapy.  Sentinel nodes were negative.  #5 Patient underwent radiation therapy from on 08/14/12 - 09/28/12.  She received this in [place].    CURRENT THERAPY: Herceptin q3 weeks  INTERVAL HISTORY: Ms wigen is here for evaluation prior to q3 week herceptin. She is doing well today.  She is fatigued.  She had been doing well and slowly recovering She denies fevers, chills, nausea, vomiting, constipation, diarrhea, numbness, chest pain, palpitations, shortness of breath, DOE, orthopnea, or further concerns.    Past Medical History: Past Medical History  Diagnosis Date  . HTN (hypertension)   . Fibromyalgia   . GERD (gastroesophageal reflux  disease)   . Arthritis   . Anxiety   . Dysrhythmia     irreg hr  . Wears hearing aid     left ear  . 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 PONV (postoperative nausea and vomiting)   . B12 deficiency anemia 10/18/2012    Past Surgical History: Past Surgical History  Procedure Laterality Date  . Appendectomy    . Combined hysteroscopy diagnostic / d&c    . Cochlear implant    . Portacath placement  02/22/2012    Procedure: INSERTION PORT-A-CATH;  Surgeon: [name], MD;  Location: [facility];  Service: General;  Laterality: Right;  . Breast lumpectomy with needle localization and axillary sentinel lymph node bx Left 06/27/2012    Procedure: BREAST LUMPECTOMY WITH NEEDLE LOCALIZATION AND AXILLARY SENTINEL LYMPH NODE BIOPSY;  Surgeon: [name], MD;  Location: [facility] OR;  Service: General;  Laterality: Left;  left breast wire localized at [facility] lumpectomy with left axillary sentinel node biopsy nuclear med 1400    Family History: Family History  Problem Relation Age of Onset  . Cancer Mother     "tumor of face removed"  . Cancer Father     kidney removed  . Cancer Maternal Aunt     stomach & Pancreatic  . Cancer Paternal Uncle     pancreatic    Social History History  Substance Use Topics  . Smoking status: Never Smoker   . Smokeless tobacco: Never Used  . Alcohol Use: 0.0 oz/week    0-1 Glasses of wine per week    Allergies: Allergies  Allergen Reactions  .  Acyclovir And Related   . Ciprofloxacin Hcl Rash  . Bactrim [Sulfamethoxazole-Trimethoprim]     Current Medications: Current Outpatient Prescriptions  Medication Sig Dispense Refill  . bisoprolol-hydrochlorothiazide (ZIAC) 5-6.25 MG per tablet Take 1 tablet by mouth daily.      . cyclobenzaprine (FLEXERIL) 10 MG tablet Take 10 mg by mouth daily as needed for muscle spasms. For muscle pain.      . DULoxetine (CYMBALTA) 60 MG capsule Take 1 capsule (60 mg total) by mouth daily.   30 capsule  3  . esomeprazole (NEXIUM) 40 MG capsule Take 1 capsule (40 mg total) by mouth daily before breakfast.  30 capsule  6  . HYDROcodone-acetaminophen (NORCO/VICODIN) 5-325 MG per tablet Take 1 tablet by mouth every 6 (six) hours as needed for pain.      Marland Kitchen levothyroxine (SYNTHROID, LEVOTHROID) 100 MCG tablet Take 100 mcg by mouth daily.      Marland Kitchen lidocaine-prilocaine (EMLA) cream Apply 1 application topically as needed. For port-a-cath access.      Marland Kitchen LORazepam (ATIVAN) 0.5 MG tablet Take 1 tablet (0.5 mg total) by mouth every 8 (eight) hours as needed for anxiety.  30 tablet  0  . meloxicam (MOBIC) 15 MG tablet Take 15 mg by mouth daily.      . mometasone (NASONEX) 50 MCG/ACT nasal spray Place 2 sprays into the nose daily.      Marland Kitchen PRESCRIPTION MEDICATION Inject into the vein. Herceptin infusion every 21 days      . pseudoephedrine (SUDAFED) 30 MG tablet Take 30 mg by mouth every 6 (six) hours as needed for congestion.       . ranitidine (ZANTAC) 300 MG tablet Take 1 tablet by mouth daily.       No current facility-administered medications for this visit.    Health Maintenance:  ColonoscopyShe has never had a colonoscopy Bone DensityBone density about 10-12 years ago Last PAP smearLast Pap smear was in 2012   REVIEW OF SYSTEMS: A 10 point review of systems was conducted and is otherwise negative except for what is noted above.    PHYSICAL EXAMINATION: Blood pressure 126/71, pulse 70, temperature 98.4 F (36.9 C), temperature source Oral, resp. rate 20, height 5\' 2"  (1.575 m), weight 175 lb 11.2 oz (79.697 kg). General: Patient is a well appearing female in no acute distress HEENT: PERRLA, sclerae anicteric no conjunctival pallor, MMM,  Neck: supple, no palpable adenopathy Lungs: clear to auscultation bilaterally, no wheezes, rhonchi, or rales Cardiovascular: regular rate rhythm, S1, S2, no murmurs, rubs or gallops,  Abdomen: Soft, non-tender, non-distended, normoactive bowel  sounds, no HSM Extremities: warm and well perfused, no clubbing, cyanosis, or edema Skin: No rashes or lesions Neuro: Non-focal ECOG PERFORMANCE STATUS: 1 - Symptomatic but completely ambulatory Breasts: left breast lumpectomy site healing well, clean dry and intact, no nodularity, right breast: no masses or nodularity STUDIES/RESULTS: No results found.   LABS:    Chemistry      Component Value Date/Time   NA 142 01/10/2013 1123   NA 139 06/21/2012 1235   K 3.8 01/10/2013 1123   K 4.1 06/21/2012 1235   CL 102 10/18/2012 1353   CL 100 06/21/2012 1235   CO2 29 01/10/2013 1123   CO2 27 06/21/2012 1235   BUN 26.0 01/10/2013 1123   BUN 14 06/21/2012 1235   CREATININE 1.1 01/10/2013 1123   CREATININE 1.24* 06/21/2012 1235      Component Value Date/Time   CALCIUM 9.4 01/10/2013 1123  CALCIUM 7.4* 06/21/2012 1235   ALKPHOS 87 01/10/2013 1123   ALKPHOS 59 05/10/2012 1909   AST 21 01/10/2013 1123   AST 30 05/10/2012 1909   ALT 22 01/10/2013 1123   ALT 69* 05/10/2012 1909   BILITOT 0.48 01/10/2013 1123   BILITOT 3.9* 05/10/2012 1909      Lab Results  Component Value Date   WBC 3.3* 01/31/2013   HGB 11.6 01/31/2013   HCT 32.9* 01/31/2013   MCV 87.3 01/31/2013   PLT 97* 01/31/2013   PATHOLOGY:  ADDITIONAL INFORMATION: PROGNOSTIC INDICATORS - ACIS Results IMMUNOHISTOCHEMICAL AND MORPHOMETRIC ANALYSIS BY THE AUTOMATED CELLULAR IMAGING SYSTEM (ACIS) Estrogen Receptor (Negative, <1%): 0%, NEGATIVE Progesterone Receptor (Negative, <1%): 0%, NEGATIVE Proliferation Marker Ki67 by M IB-1 (Low<20%): 98% All controls stained appropriately Pecola Leisure MD Pathologist, Electronic Signature ( Signed 02/15/2012) CHROMOGENIC IN-SITU HYBRIDIZATION Interpretation: HER2/NEU BY CISH - SHOWS AMPLIFICATION BY CISH ANALYSIS. THE RATIO OF HER2: CEP 17 SIGNALS WAS 2.55. 40 TUMOR CELLS WERE SCORED. OF NOTE, THE TUMOR APPEARS TO SHOW HETEROGENEITY IN REGARDS TO HER2 EXPRESSION. Reference range: Ratio: HER2:CEP17 <  1.8 gene amplification not observed Ratio: HER2:CEP 17 1.8-2.2 - equivocal result Ratio: HER2:CEP17 > 2.2 - gene amplification observed Pecola Leisure MD Pathologist, Electronic Signature ( Signed 02/15/2012) 1 of ADDITIONAL INFORMATION: PROGNOSTIC INDICATORS - ACIS Results IMMUNOHISTOCHEMICAL AND MORPHOMETRIC ANALYSIS BY THE AUTOMATED CELLULAR IMAGING SYSTEM (ACIS) Estrogen Receptor (Negative, <1%): 0%, NEGATIVE Progesterone Receptor (Negative, <1%): 0%, NEGATIVE COMMENT: The negative hormone receptor study(ies) in this case have an internal positive control. All controls stained appropriately Abigail Miyamoto MD Pathologist, Electronic Signature ( Signed 02/22/2012)  ASSESSMENT #1 Stage 1 HER-2 positive left breast cancer ER negative PR negative.  Patient received for neoadjuvant chemotherapy consisting of Taxotere carboplatinum and herceptin as she does want breast conservation.  She understands the risks and benefits of this approach and the side effects of her chemotherapy and Herceptin. She has completed chemotherapy, surgery, radiation therapy, and  will continue Adjuvant Herceptin to complete 1 year of herceptin therapy.    #2  She does have thrombocytopenia and we will monitor this  PLAN:  #1 Doing well.  Patient will proceed with Herceptin therapy today.    #2 She receives B12 injections and will continue this monthly.    #3. She will return in 3 weeks for labs and evaluation.   All questions were answered. The patient knows to call the clinic with any problems, questions or concerns. We can certainly see the patient much sooner if necessary.  I spent 20 minutes counseling the patient face to face. The total time spent in the appointment was 25 minutes.  Drue Second, MD Medical/Oncology [facility] [phone] (beeper) [phone] (Office)  01/31/2013, 12:06 PM

## 2013-01-31 NOTE — Patient Instructions (Addendum)
Proceed with herceptin  We will see you  Back in 3 weeks

## 2013-01-31 NOTE — Patient Instructions (Addendum)
[facility] Discharge Instructions for Patients Receiving Chemotherapy  Today you received Herceptin.   If you develop nausea and vomiting that is not controlled by your nausea medication, call the clinic.   BELOW ARE SYMPTOMS THAT SHOULD BE REPORTED IMMEDIATELY:  *FEVER GREATER THAN 100.5 F  *CHILLS WITH OR WITHOUT FEVER  NAUSEA AND VOMITING THAT IS NOT CONTROLLED WITH YOUR NAUSEA MEDICATION  *UNUSUAL SHORTNESS OF BREATH  *UNUSUAL BRUISING OR BLEEDING  TENDERNESS IN MOUTH AND THROAT WITH OR WITHOUT PRESENCE OF ULCERS  *URINARY PROBLEMS  *BOWEL PROBLEMS  UNUSUAL RASH Items with * indicate a potential emergency and should be followed up as soon as possible.  Feel free to call the clinic you have any questions or concerns. The clinic phone number is [phone].    

## 2013-02-07 ENCOUNTER — Ambulatory Visit: Payer: BC Managed Care – PPO

## 2013-02-21 ENCOUNTER — Telehealth: Payer: Self-pay | Admitting: *Deleted

## 2013-02-21 ENCOUNTER — Other Ambulatory Visit (HOSPITAL_BASED_OUTPATIENT_CLINIC_OR_DEPARTMENT_OTHER): Payer: BC Managed Care – PPO | Admitting: Lab

## 2013-02-21 ENCOUNTER — Ambulatory Visit (HOSPITAL_BASED_OUTPATIENT_CLINIC_OR_DEPARTMENT_OTHER): Payer: BC Managed Care – PPO | Admitting: Adult Health

## 2013-02-21 ENCOUNTER — Ambulatory Visit: Payer: BC Managed Care – PPO

## 2013-02-21 ENCOUNTER — Ambulatory Visit (HOSPITAL_BASED_OUTPATIENT_CLINIC_OR_DEPARTMENT_OTHER): Payer: BC Managed Care – PPO

## 2013-02-21 ENCOUNTER — Encounter: Payer: Self-pay | Admitting: Gynecology

## 2013-02-21 ENCOUNTER — Encounter: Payer: Self-pay | Admitting: Adult Health

## 2013-02-21 VITALS — BP 125/72 | HR 67 | Temp 98.5°F | Resp 18 | Ht 62.0 in | Wt 178.4 lb

## 2013-02-21 DIAGNOSIS — D518 Other vitamin B12 deficiency anemias: Secondary | ICD-10-CM

## 2013-02-21 DIAGNOSIS — C50419 Malignant neoplasm of upper-outer quadrant of unspecified female breast: Secondary | ICD-10-CM

## 2013-02-21 DIAGNOSIS — D519 Vitamin B12 deficiency anemia, unspecified: Secondary | ICD-10-CM

## 2013-02-21 DIAGNOSIS — Z5112 Encounter for antineoplastic immunotherapy: Secondary | ICD-10-CM

## 2013-02-21 DIAGNOSIS — D696 Thrombocytopenia, unspecified: Secondary | ICD-10-CM

## 2013-02-21 DIAGNOSIS — Z171 Estrogen receptor negative status [ER-]: Secondary | ICD-10-CM

## 2013-02-21 LAB — CBC WITH DIFFERENTIAL/PLATELET
BASO%: 0.6 % (ref 0.0–2.0)
Basophils Absolute: 0 10*3/uL (ref 0.0–0.1)
EOS%: 4.5 % (ref 0.0–7.0)
Eosinophils Absolute: 0.1 10*3/uL (ref 0.0–0.5)
HCT: 32.4 % — ABNORMAL LOW (ref 34.8–46.6)
HGB: 11.4 g/dL — ABNORMAL LOW (ref 11.6–15.9)
MCH: 30.4 pg (ref 25.1–34.0)
MCHC: 35.2 g/dL (ref 31.5–36.0)
MCV: 86.4 fL (ref 79.5–101.0)
MONO%: 9.1 % (ref 0.0–14.0)
NEUT%: 45 % (ref 38.4–76.8)
RDW: 13.4 % (ref 11.2–14.5)
lymph#: 1.3 10*3/uL (ref 0.9–3.3)

## 2013-02-21 LAB — COMPREHENSIVE METABOLIC PANEL (CC13)
AST: 24 U/L (ref 5–34)
Albumin: 4 g/dL (ref 3.5–5.0)
Alkaline Phosphatase: 85 U/L (ref 40–150)
BUN: 20.1 mg/dL (ref 7.0–26.0)
Calcium: 9.8 mg/dL (ref 8.4–10.4)
Chloride: 103 mEq/L (ref 98–109)
Creatinine: 1.1 mg/dL (ref 0.6–1.1)
Glucose: 119 mg/dl (ref 70–140)

## 2013-02-21 MED ORDER — ACETAMINOPHEN 325 MG PO TABS
650.0000 mg | ORAL_TABLET | Freq: Once | ORAL | Status: AC
  Administered 2013-02-21: 650 mg via ORAL

## 2013-02-21 MED ORDER — CYANOCOBALAMIN 1000 MCG/ML IJ SOLN
INTRAMUSCULAR | Status: AC
  Filled 2013-02-21: qty 1

## 2013-02-21 MED ORDER — CYANOCOBALAMIN 1000 MCG/ML IJ SOLN
1000.0000 ug | Freq: Once | INTRAMUSCULAR | Status: AC
  Administered 2013-02-21: 1000 ug via INTRAMUSCULAR

## 2013-02-21 MED ORDER — HEPARIN SOD (PORK) LOCK FLUSH 100 UNIT/ML IV SOLN
500.0000 [IU] | Freq: Once | INTRAVENOUS | Status: AC | PRN
  Administered 2013-02-21: 500 [IU]
  Filled 2013-02-21: qty 5

## 2013-02-21 MED ORDER — SODIUM CHLORIDE 0.9 % IJ SOLN
10.0000 mL | INTRAMUSCULAR | Status: DC | PRN
  Administered 2013-02-21: 10 mL
  Filled 2013-02-21: qty 10

## 2013-02-21 MED ORDER — TRASTUZUMAB CHEMO INJECTION 440 MG
6.0000 mg/kg | Freq: Once | INTRAVENOUS | Status: AC
  Administered 2013-02-21: 462 mg via INTRAVENOUS
  Filled 2013-02-21: qty 22

## 2013-02-21 MED ORDER — ACETAMINOPHEN 325 MG PO TABS
ORAL_TABLET | ORAL | Status: AC
  Filled 2013-02-21: qty 2

## 2013-02-21 MED ORDER — SODIUM CHLORIDE 0.9 % IV SOLN
Freq: Once | INTRAVENOUS | Status: AC
  Administered 2013-02-21: 14:00:00 via INTRAVENOUS

## 2013-02-21 NOTE — Telephone Encounter (Signed)
Per staff message and POF I have scheduled appts.  Maria Mckinney  

## 2013-02-21 NOTE — Progress Notes (Signed)
Declines Benadryl premed since she is driving.

## 2013-02-21 NOTE — Progress Notes (Signed)
Maria Mckinney [id number] Sep 17, 1950 62 y.o. 02/22/2013 9:40 AM  CC  [name], MD Dr. [name] [address] [place] [address] Dr. [name] Dr. [name] Dr. 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  PRIOR THERAPY:  #1Patient was originally seen in the multidisciplinary breast clinic for new diagnosis of left breast cancer that was 8mm in the upper outer quadrant of the left breast.  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  There was an additional 4 cm area of normal signal.  Patient did not get an MRI secondary to cochlear implant.  She desires breast conservation.    #2 Patient is s/p port-a-cath placement for neoadjuvant chemotherapy consisting of Taxotere/carboplatinum/Herceptin.  She began cycle #1 on 02/29/2012.  She received Taxotere and carboplatinum every 21 days with Herceptin giving weekly.  She completed a total of 4 cycles of Taxotere and Carboplatinum on 05/03/12.    #3 Herceptin every 3 weeks beginning 05/23/12.  #4 Lumpectomy by Dr. Dwain Sarna on 06/27/2012 showed complete pathologic response to neoadjuvant chemotherapy.  Sentinel nodes were negative.  #5 Patient underwent radiation therapy from on 08/14/12 - 09/28/12.  She received this in [place].    CURRENT THERAPY: Herceptin q3 weeks  INTERVAL HISTORY: Ms dieu is here for evaluation prior to q3 week herceptin.  She is doing well.  She denies fevers, chills, chest pain, palpitations, DOE, swelling, night sweats or any other concerns.  A 10 point ROS is otherwise neg.    Past Medical History: Past Medical History  Diagnosis Date  . HTN (hypertension)   . Fibromyalgia   . GERD (gastroesophageal reflux disease)   . Arthritis   . Anxiety   . Dysrhythmia     irreg hr  . Wears hearing aid      left ear  . 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 PONV (postoperative nausea and vomiting)   . B12 deficiency anemia 10/18/2012    Past Surgical History: Past Surgical History  Procedure Laterality Date  . Appendectomy    . Combined hysteroscopy diagnostic / d&c    . Cochlear implant    . Portacath placement  02/22/2012    Procedure: INSERTION PORT-A-CATH;  Surgeon: [name], MD;  Location: [facility];  Service: General;  Laterality: Right;  . Breast lumpectomy with needle localization and axillary sentinel lymph node bx Left 06/27/2012    Procedure: BREAST LUMPECTOMY WITH NEEDLE LOCALIZATION AND AXILLARY SENTINEL LYMPH NODE BIOPSY;  Surgeon: [name], MD;  Location: [facility];  Service: General;  Laterality: Left;  left breast wire localized at [facility] lumpectomy with left axillary sentinel node biopsy nuclear med 1400    Family History: Family History  Problem Relation Age of Onset  . Cancer Mother     "tumor of face removed"  . Cancer Father     kidney removed  . Cancer Maternal Aunt     stomach & Pancreatic  . Cancer Paternal Uncle     pancreatic    Social History History  Substance Use Topics  . Smoking status: Never Smoker   . Smokeless tobacco: Never Used  . Alcohol Use: 0.0 oz/week    0-1 Glasses of wine per week    Allergies: Allergies  Allergen Reactions  . Acyclovir And Related   . Ciprofloxacin Hcl Rash  .  Bactrim [Sulfamethoxazole-Trimethoprim]     Current Medications: Current Outpatient Prescriptions  Medication Sig Dispense Refill  . bisoprolol-hydrochlorothiazide (ZIAC) 5-6.25 MG per tablet Take 1 tablet by mouth daily.      . cyclobenzaprine (FLEXERIL) 10 MG tablet Take 10 mg by mouth daily as needed for muscle spasms. For muscle pain.      . DULoxetine (CYMBALTA) 60 MG capsule Take 1 capsule (60 mg total) by mouth daily.  30 capsule  3  . esomeprazole (NEXIUM) 40 MG capsule Take 1 capsule (40 mg total) by mouth  daily before breakfast.  30 capsule  6  . HYDROcodone-acetaminophen (NORCO/VICODIN) 5-325 MG per tablet Take 1 tablet by mouth every 6 (six) hours as needed for pain.      Marland Kitchen levothyroxine (SYNTHROID, LEVOTHROID) 100 MCG tablet Take 100 mcg by mouth daily.      Marland Kitchen lidocaine-prilocaine (EMLA) cream Apply 1 application topically as needed. For port-a-cath access.      Marland Kitchen LORazepam (ATIVAN) 0.5 MG tablet Take 1 tablet (0.5 mg total) by mouth every 8 (eight) hours as needed for anxiety.  30 tablet  0  . meloxicam (MOBIC) 15 MG tablet Take 15 mg by mouth daily.      . mometasone (NASONEX) 50 MCG/ACT nasal spray Place 2 sprays into the nose daily.      Marland Kitchen PRESCRIPTION MEDICATION Inject into the vein. Herceptin infusion every 21 days      . pseudoephedrine (SUDAFED) 30 MG tablet Take 30 mg by mouth every 6 (six) hours as needed for congestion.       . ranitidine (ZANTAC) 300 MG tablet Take 1 tablet by mouth daily.       No current facility-administered medications for this visit.   REVIEW OF SYSTEMS: A 10 point review of systems was conducted and is otherwise negative except for what is noted above.    PHYSICAL EXAMINATION: Blood pressure 125/72, pulse 67, temperature 98.5 F (36.9 C), temperature source Oral, resp. rate 18, height 5\' 2"  (1.575 m), weight 178 lb 6.4 oz (80.922 kg). General: Patient is a well appearing female in no acute distress HEENT: PERRLA, sclerae anicteric no conjunctival pallor, MMM,  Neck: supple, no palpable adenopathy Lungs: clear to auscultation bilaterally, no wheezes, rhonchi, or rales Cardiovascular: regular rate rhythm, S1, S2, no murmurs, rubs or gallops,  Abdomen: Soft, non-tender, non-distended, normoactive bowel sounds, no HSM Extremities: warm and well perfused, no clubbing, cyanosis, or edema Skin: No rashes or lesions Neuro: Non-focal ECOG PERFORMANCE STATUS: 1 - Symptomatic but completely ambulatory Breasts: left breast lumpectomy site healing well, clean dry  and intact, no nodularity, right breast: no masses or nodularity STUDIES/RESULTS: No results found.   LABS:    Chemistry      Component Value Date/Time   NA 139 02/21/2013 1115   NA 139 06/21/2012 1235   K 3.6 02/21/2013 1115   K 4.1 06/21/2012 1235   CL 102 10/18/2012 1353   CL 100 06/21/2012 1235   CO2 27 02/21/2013 1115   CO2 27 06/21/2012 1235   BUN 20.1 02/21/2013 1115   BUN 14 06/21/2012 1235   CREATININE 1.1 02/21/2013 1115   CREATININE 1.24* 06/21/2012 1235      Component Value Date/Time   CALCIUM 9.8 02/21/2013 1115   CALCIUM 7.4* 06/21/2012 1235   ALKPHOS 85 02/21/2013 1115   ALKPHOS 59 05/10/2012 1909   AST 24 02/21/2013 1115   AST 30 05/10/2012 1909   ALT 30 02/21/2013 1115   ALT 69*  05/10/2012 1909   BILITOT 0.51 02/21/2013 1115   BILITOT 3.9* 05/10/2012 1909      Lab Results  Component Value Date   WBC 3.1* 02/21/2013   HGB 11.4* 02/21/2013   HCT 32.4* 02/21/2013   MCV 86.4 02/21/2013   PLT 108* 02/21/2013   PATHOLOGY:  ADDITIONAL INFORMATION: PROGNOSTIC INDICATORS - ACIS Results IMMUNOHISTOCHEMICAL AND MORPHOMETRIC ANALYSIS BY THE AUTOMATED CELLULAR IMAGING SYSTEM (ACIS) Estrogen Receptor (Negative, <1%): 0%, NEGATIVE Progesterone Receptor (Negative, <1%): 0%, NEGATIVE Proliferation Marker Ki67 by M IB-1 (Low<20%): 98% All controls stained appropriately Pecola Leisure MD Pathologist, Electronic Signature ( Signed 02/15/2012) CHROMOGENIC IN-SITU HYBRIDIZATION Interpretation: HER2/NEU BY CISH - SHOWS AMPLIFICATION BY CISH ANALYSIS. THE RATIO OF HER2: CEP 17 SIGNALS WAS 2.55. 40 TUMOR CELLS WERE SCORED. OF NOTE, THE TUMOR APPEARS TO SHOW HETEROGENEITY IN REGARDS TO HER2 EXPRESSION. Reference range: Ratio: HER2:CEP17 < 1.8 gene amplification not observed Ratio: HER2:CEP 17 1.8-2.2 - equivocal result Ratio: HER2:CEP17 > 2.2 - gene amplification observed Pecola Leisure MD Pathologist, Electronic Signature ( Signed 02/15/2012) 1 of ADDITIONAL  INFORMATION: PROGNOSTIC INDICATORS - ACIS Results IMMUNOHISTOCHEMICAL AND MORPHOMETRIC ANALYSIS BY THE AUTOMATED CELLULAR IMAGING SYSTEM (ACIS) Estrogen Receptor (Negative, <1%): 0%, NEGATIVE Progesterone Receptor (Negative, <1%): 0%, NEGATIVE COMMENT: The negative hormone receptor study(ies) in this case have an internal positive control. All controls stained appropriately Abigail Miyamoto MD Pathologist, Electronic Signature ( Signed 02/22/2012)  ASSESSMENT #1 Stage 1 HER-2 positive left breast cancer ER negative PR negative.  Patient received for neoadjuvant chemotherapy consisting of Taxotere carboplatinum and herceptin as she does want breast conservation.  She understands the risks and benefits of this approach and the side effects of her chemotherapy and Herceptin. She has completed chemotherapy, surgery, radiation therapy, and  will continue Adjuvant Herceptin to complete 1 year of herceptin therapy.    #2  She does have thrombocytopenia and we will monitor this  PLAN:  #1 Patient will proceed with Herceptin today.  I reviewed her labs with her.  She will f/u with Dr. [name] in November.   #2 She will return in 3 weeks for f/u with [place].    All questions were answered. The patient knows to call the clinic with any problems, questions or concerns. We can certainly see the patient much sooner if necessary.  I spent 15 minutes counseling the patient face to face. The total time spent in the appointment was 30 minutes.  Illa Level, NP Medical Oncology [facility] [phone] 02/22/2013, 9:40 AM

## 2013-02-21 NOTE — Patient Instructions (Signed)
[facility] Discharge Instructions for Patients Receiving Chemotherapy  Today you received the following chemotherapy agent: Herceptin  To help prevent nausea and vomiting after your treatment, we encourage you to take your nausea medication if needed.     If you develop nausea and vomiting that is not controlled by your nausea medication, call the clinic.   BELOW ARE SYMPTOMS THAT SHOULD BE REPORTED IMMEDIATELY:  *FEVER GREATER THAN 100.5 F  *CHILLS WITH OR WITHOUT FEVER  NAUSEA AND VOMITING THAT IS NOT CONTROLLED WITH YOUR NAUSEA MEDICATION  *UNUSUAL SHORTNESS OF BREATH  *UNUSUAL BRUISING OR BLEEDING  TENDERNESS IN MOUTH AND THROAT WITH OR WITHOUT PRESENCE OF ULCERS  *URINARY PROBLEMS  *BOWEL PROBLEMS  UNUSUAL RASH Items with * indicate a potential emergency and should be followed up as soon as possible.  Feel free to call the clinic you have any questions or concerns. The clinic phone number is [phone].  It has been a pleasure to serve you today!

## 2013-03-05 ENCOUNTER — Telehealth: Payer: Self-pay | Admitting: Oncology

## 2013-03-07 ENCOUNTER — Ambulatory Visit: Payer: BC Managed Care – PPO

## 2013-03-07 ENCOUNTER — Other Ambulatory Visit: Payer: Self-pay

## 2013-03-13 ENCOUNTER — Telehealth: Payer: Self-pay | Admitting: *Deleted

## 2013-03-13 NOTE — Telephone Encounter (Signed)
Message copied by Bridget Westbrooks, Gerald Leitz on Wed Mar 13, 2013  5:04 PM ------      Message from: Illa Level      Created: Wed Mar 13, 2013  4:12 PM      Regarding: RE: echo        Patient needs echo with Dr. [name], and that time is fine.             Thanks,       L      ----- Message -----         From: Jolinda Croak, RN         Sent: 03/13/2013   4:01 PM           To: Victorino December, MD, Illa Level, NP      Subject: echo                                                     Pharmacy called pt due for echo.       Pt last seen on 10/23.       Last echo done at Dr. Lin Landsman office.       Per 10/23 note pt to f/u with Dr. [name] in November, I called his office they can see pt for echo on nov 19 at 4pm, order and  pre-cert needed for echo.      Please advise thanksCorrie Dandy       ------

## 2013-03-13 NOTE — Telephone Encounter (Signed)
Call to [facility] in managed care for pre cert.

## 2013-03-14 ENCOUNTER — Other Ambulatory Visit: Payer: BC Managed Care – PPO | Admitting: Lab

## 2013-03-14 ENCOUNTER — Telehealth: Payer: Self-pay | Admitting: *Deleted

## 2013-03-14 ENCOUNTER — Ambulatory Visit (HOSPITAL_BASED_OUTPATIENT_CLINIC_OR_DEPARTMENT_OTHER): Payer: BC Managed Care – PPO | Admitting: Oncology

## 2013-03-14 ENCOUNTER — Ambulatory Visit: Payer: BC Managed Care – PPO | Admitting: Adult Health

## 2013-03-14 ENCOUNTER — Encounter: Payer: Self-pay | Admitting: Oncology

## 2013-03-14 ENCOUNTER — Ambulatory Visit (HOSPITAL_BASED_OUTPATIENT_CLINIC_OR_DEPARTMENT_OTHER): Payer: BC Managed Care – PPO

## 2013-03-14 ENCOUNTER — Other Ambulatory Visit (HOSPITAL_BASED_OUTPATIENT_CLINIC_OR_DEPARTMENT_OTHER): Payer: BC Managed Care – PPO | Admitting: Lab

## 2013-03-14 VITALS — BP 137/70 | HR 68 | Temp 98.9°F | Resp 18 | Ht 62.0 in | Wt 180.0 lb

## 2013-03-14 DIAGNOSIS — E538 Deficiency of other specified B group vitamins: Secondary | ICD-10-CM

## 2013-03-14 DIAGNOSIS — Z171 Estrogen receptor negative status [ER-]: Secondary | ICD-10-CM

## 2013-03-14 DIAGNOSIS — C50419 Malignant neoplasm of upper-outer quadrant of unspecified female breast: Secondary | ICD-10-CM

## 2013-03-14 DIAGNOSIS — Z5112 Encounter for antineoplastic immunotherapy: Secondary | ICD-10-CM

## 2013-03-14 DIAGNOSIS — D696 Thrombocytopenia, unspecified: Secondary | ICD-10-CM

## 2013-03-14 LAB — COMPREHENSIVE METABOLIC PANEL (CC13)
ALT: 32 U/L (ref 0–55)
AST: 24 U/L (ref 5–34)
Albumin: 4 g/dL (ref 3.5–5.0)
Alkaline Phosphatase: 96 U/L (ref 40–150)
Calcium: 9.7 mg/dL (ref 8.4–10.4)
Chloride: 104 mEq/L (ref 98–109)
Creatinine: 1.1 mg/dL (ref 0.6–1.1)
Potassium: 3.6 mEq/L (ref 3.5–5.1)
Sodium: 141 mEq/L (ref 136–145)
Total Protein: 6.6 g/dL (ref 6.4–8.3)

## 2013-03-14 LAB — CBC WITH DIFFERENTIAL/PLATELET
BASO%: 0.6 % (ref 0.0–2.0)
EOS%: 3.3 % (ref 0.0–7.0)
Eosinophils Absolute: 0.1 10*3/uL (ref 0.0–0.5)
HCT: 33.4 % — ABNORMAL LOW (ref 34.8–46.6)
HGB: 11.7 g/dL (ref 11.6–15.9)
MCH: 30.7 pg (ref 25.1–34.0)
MCHC: 35 g/dL (ref 31.5–36.0)
MCV: 87.7 fL (ref 79.5–101.0)
MONO#: 0.4 10*3/uL (ref 0.1–0.9)
MONO%: 9.6 % (ref 0.0–14.0)
NEUT%: 49.6 % (ref 38.4–76.8)
Platelets: 95 10*3/uL — ABNORMAL LOW (ref 145–400)
RDW: 13.7 % (ref 11.2–14.5)
WBC: 3.6 10*3/uL — ABNORMAL LOW (ref 3.9–10.3)
lymph#: 1.3 10*3/uL (ref 0.9–3.3)

## 2013-03-14 MED ORDER — HEPARIN SOD (PORK) LOCK FLUSH 100 UNIT/ML IV SOLN
500.0000 [IU] | Freq: Once | INTRAVENOUS | Status: AC | PRN
  Administered 2013-03-14: 500 [IU]
  Filled 2013-03-14: qty 5

## 2013-03-14 MED ORDER — SODIUM CHLORIDE 0.9 % IJ SOLN
10.0000 mL | INTRAMUSCULAR | Status: DC | PRN
  Administered 2013-03-14: 10 mL
  Filled 2013-03-14: qty 10

## 2013-03-14 MED ORDER — TRASTUZUMAB CHEMO INJECTION 440 MG
6.0000 mg/kg | Freq: Once | INTRAVENOUS | Status: AC
  Administered 2013-03-14: 462 mg via INTRAVENOUS
  Filled 2013-03-14: qty 22

## 2013-03-14 MED ORDER — ACETAMINOPHEN 325 MG PO TABS
ORAL_TABLET | ORAL | Status: AC
  Filled 2013-03-14: qty 2

## 2013-03-14 MED ORDER — ACETAMINOPHEN 325 MG PO TABS
650.0000 mg | ORAL_TABLET | Freq: Once | ORAL | Status: AC
  Administered 2013-03-14: 650 mg via ORAL

## 2013-03-14 MED ORDER — SODIUM CHLORIDE 0.9 % IV SOLN
Freq: Once | INTRAVENOUS | Status: AC
  Administered 2013-03-14: 15:00:00 via INTRAVENOUS

## 2013-03-14 NOTE — Progress Notes (Signed)
Maria Mckinney [id number] 02-17-1951 62 y.o. 03/14/2013 2:25 PM  CC  Maria Ravel, MD Dr. [name] [address] [place] [address] Dr. [name] Dr. [name] Dr. 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  PRIOR THERAPY:  #1Patient was originally seen in the multidisciplinary breast clinic for new diagnosis of left breast cancer that was 8mm in the upper outer quadrant of the left breast.  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  There was an additional 4 cm area of normal signal.  Patient did not get an MRI secondary to cochlear implant.  She desires breast conservation.    #2 Patient is s/p port-a-cath placement for neoadjuvant chemotherapy consisting of Taxotere/carboplatinum/Herceptin.  She began cycle #1 on 02/29/2012.  She received Taxotere and carboplatinum every 21 days with Herceptin giving weekly.  She completed a total of 4 cycles of Taxotere and Carboplatinum on 05/03/12.    #3 Herceptin every 3 weeks beginning 05/23/12 - 03/14/13  #4 Lumpectomy by Dr. Dwain Sarna on 06/27/2012 showed complete pathologic response to neoadjuvant chemotherapy.  Sentinel nodes were negative.  #5 Patient underwent radiation therapy from on 08/14/12 - 09/28/12.  She received this in [place].    CURRENT THERAPY: Herceptin q3 weeks (final dose)  INTERVAL HISTORY: Patient is seen in followup today prior to her last Herceptin dose. Overall she's doing well without any complaints. She denies any fevers chills night sweats headaches shortness of breath chest pains palpitations no myalgias and arthralgias no peripheral paresthesias. She denies any abdominal pain no diarrhea or constipation. No bleeding problems or easy bruising. Remainder of the 10 point review of systems is negative.     Past  Medical History: Past Medical History  Diagnosis Date  . HTN (hypertension)   . Fibromyalgia   . GERD (gastroesophageal reflux disease)   . Arthritis   . Anxiety   . Dysrhythmia     irreg hr  . Wears hearing aid     left ear  . 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 PONV (postoperative nausea and vomiting)   . B12 deficiency anemia 10/18/2012    Past Surgical History: Past Surgical History  Procedure Laterality Date  . Appendectomy    . Combined hysteroscopy diagnostic / d&c    . Cochlear implant    . Portacath placement  02/22/2012    Procedure: INSERTION PORT-A-CATH;  Surgeon: [name], MD;  Location: [facility];  Service: General;  Laterality: Right;  . Breast lumpectomy with needle localization and axillary sentinel lymph node bx Left 06/27/2012    Procedure: BREAST LUMPECTOMY WITH NEEDLE LOCALIZATION AND AXILLARY SENTINEL LYMPH NODE BIOPSY;  Surgeon: [name], MD;  Location: [facility] OR;  Service: General;  Laterality: Left;  left breast wire localized at [facility] lumpectomy with left axillary sentinel node biopsy nuclear med 1400    Family History: Family History  Problem Relation Age of Onset  . Cancer Mother     "tumor of face removed"  . Cancer Father     kidney removed  . Cancer Maternal Aunt     stomach & Pancreatic  . Cancer Paternal Uncle     pancreatic    Social History History  Substance Use Topics  . Smoking status: Never Smoker   . Smokeless tobacco: Never Used  . Alcohol Use:  0.0 oz/week    0-1 Glasses of wine per week    Allergies: Allergies  Allergen Reactions  . Acyclovir And Related   . Ciprofloxacin Hcl Rash  . Bactrim [Sulfamethoxazole-Trimethoprim]     Current Medications: Current Outpatient Prescriptions  Medication Sig Dispense Refill  . bisoprolol-hydrochlorothiazide (ZIAC) 5-6.25 MG per tablet Take 1 tablet by mouth daily.      . cyclobenzaprine (FLEXERIL) 10 MG tablet Take 10 mg by mouth daily as  needed for muscle spasms. For muscle pain.      . DULoxetine (CYMBALTA) 60 MG capsule Take 1 capsule (60 mg total) by mouth daily.  30 capsule  3  . esomeprazole (NEXIUM) 40 MG capsule Take 1 capsule (40 mg total) by mouth daily before breakfast.  30 capsule  6  . HYDROcodone-acetaminophen (NORCO/VICODIN) 5-325 MG per tablet Take 1 tablet by mouth every 6 (six) hours as needed for pain.      Marland Kitchen levothyroxine (SYNTHROID, LEVOTHROID) 100 MCG tablet Take 112 mcg by mouth daily.       Marland Kitchen LORazepam (ATIVAN) 0.5 MG tablet Take 1 tablet (0.5 mg total) by mouth every 8 (eight) hours as needed for anxiety.  30 tablet  0  . meloxicam (MOBIC) 15 MG tablet Take 15 mg by mouth daily.      . mometasone (NASONEX) 50 MCG/ACT nasal spray Place 2 sprays into the nose daily.      Marland Kitchen PRESCRIPTION MEDICATION Inject into the vein. Herceptin infusion every 21 days      . pseudoephedrine (SUDAFED) 30 MG tablet Take 30 mg by mouth every 6 (six) hours as needed for congestion.       . ranitidine (ZANTAC) 300 MG tablet Take 1 tablet by mouth daily.       No current facility-administered medications for this visit.   REVIEW OF SYSTEMS: A 10 point review of systems was conducted and is otherwise negative except for what is noted above.    PHYSICAL EXAMINATION: Blood pressure 137/70, pulse 68, temperature 98.9 F (37.2 C), temperature source Oral, resp. rate 18, height 5\' 2"  (1.575 m), weight 180 lb (81.647 kg). General: Patient is a well appearing female in no acute distress HEENT: PERRLA, sclerae anicteric no conjunctival pallor, MMM,  Neck: supple, no palpable adenopathy Lungs: clear to auscultation bilaterally, no wheezes, rhonchi, or rales Cardiovascular: regular rate rhythm, S1, S2, no murmurs, rubs or gallops,  Abdomen: Soft, non-tender, non-distended, normoactive bowel sounds, no HSM Extremities: warm and well perfused, no clubbing, cyanosis, or edema Skin: No rashes or lesions Neuro: Non-focal ECOG PERFORMANCE  STATUS: 1 - Symptomatic but completely ambulatory Breasts: left breast lumpectomy site healing well, clean dry and intact, no nodularity, right breast: no masses or nodularity STUDIES/RESULTS: No results found.   LABS:    Chemistry      Component Value Date/Time   NA 139 02/21/2013 1115   NA 139 06/21/2012 1235   K 3.6 02/21/2013 1115   K 4.1 06/21/2012 1235   CL 102 10/18/2012 1353   CL 100 06/21/2012 1235   CO2 27 02/21/2013 1115   CO2 27 06/21/2012 1235   BUN 20.1 02/21/2013 1115   BUN 14 06/21/2012 1235   CREATININE 1.1 02/21/2013 1115   CREATININE 1.24* 06/21/2012 1235      Component Value Date/Time   CALCIUM 9.8 02/21/2013 1115   CALCIUM 7.4* 06/21/2012 1235   ALKPHOS 85 02/21/2013 1115   ALKPHOS 59 05/10/2012 1909   AST 24 02/21/2013 1115   AST  30 05/10/2012 1909   ALT 30 02/21/2013 1115   ALT 69* 05/10/2012 1909   BILITOT 0.51 02/21/2013 1115   BILITOT 3.9* 05/10/2012 1909      Lab Results  Component Value Date   WBC 3.6* 03/14/2013   HGB 11.7 03/14/2013   HCT 33.4* 03/14/2013   MCV 87.7 03/14/2013   PLT 95* 03/14/2013   PATHOLOGY:  ADDITIONAL INFORMATION: PROGNOSTIC INDICATORS - ACIS Results IMMUNOHISTOCHEMICAL AND MORPHOMETRIC ANALYSIS BY THE AUTOMATED CELLULAR IMAGING SYSTEM (ACIS) Estrogen Receptor (Negative, <1%): 0%, NEGATIVE Progesterone Receptor (Negative, <1%): 0%, NEGATIVE Proliferation Marker Ki67 by M IB-1 (Low<20%): 98% All controls stained appropriately Pecola Leisure MD Pathologist, Electronic Signature ( Signed 02/15/2012) CHROMOGENIC IN-SITU HYBRIDIZATION Interpretation: HER2/NEU BY CISH - SHOWS AMPLIFICATION BY CISH ANALYSIS. THE RATIO OF HER2: CEP 17 SIGNALS WAS 2.55. 40 TUMOR CELLS WERE SCORED. OF NOTE, THE TUMOR APPEARS TO SHOW HETEROGENEITY IN REGARDS TO HER2 EXPRESSION. Reference range: Ratio: HER2:CEP17 < 1.8 gene amplification not observed Ratio: HER2:CEP 17 1.8-2.2 - equivocal result Ratio: HER2:CEP17 > 2.2 - gene amplification  observed Pecola Leisure MD Pathologist, Electronic Signature ( Signed 02/15/2012) 1 of ADDITIONAL INFORMATION: PROGNOSTIC INDICATORS - ACIS Results IMMUNOHISTOCHEMICAL AND MORPHOMETRIC ANALYSIS BY THE AUTOMATED CELLULAR IMAGING SYSTEM (ACIS) Estrogen Receptor (Negative, <1%): 0%, NEGATIVE Progesterone Receptor (Negative, <1%): 0%, NEGATIVE COMMENT: The negative hormone receptor study(ies) in this case have an internal positive control. All controls stained appropriately Abigail Miyamoto MD Pathologist, Electronic Signature ( Signed 02/22/2012)  ASSESSMENT #1 Stage 1 HER-2 positive left breast cancer ER negative PR negative.  Patient received for neoadjuvant chemotherapy consisting of Taxotere carboplatinum and herceptin as she does want breast conservation.  She understands the risks and benefits of this approach and the side effects of her chemotherapy and Herceptin. She has completed chemotherapy, surgery, radiation therapy, and  will continue Adjuvant Herceptin to complete 1 year of herceptin therapy.  Herceptin completed on 03/14/2013.  #2  She does have thrombocytopenia and we will monitor this  #3 B12 deficiency  PLAN:  #1 Patient will proceed with Herceptin today. She has now completed all of her adjuvant systemic Herceptin.  #2 patient will continue to get B12 injections on a monthly basis.  #3 she will is seen in 6 months time in followup.  All questions were answered. The patient knows to call the clinic with any problems, questions or concerns. We can certainly see the patient much sooner if necessary.  I spent 15 minutes counseling the patient face to face. The total time spent in the appointment was 30 minutes.  Drue Second, MD Medical/Oncology [facility] [phone] (beeper) [phone] (Office)  03/14/2013, 2:39 PM

## 2013-03-14 NOTE — Patient Instructions (Signed)
[facility] Discharge Instructions for Patients Receiving Chemotherapy  Today you received the following chemotherapy agents Herceptin.  To help prevent nausea and vomiting after your treatment, we encourage you to take your nausea medication as prescribed.   If you develop nausea and vomiting that is not controlled by your nausea medication, call the clinic.   BELOW ARE SYMPTOMS THAT SHOULD BE REPORTED IMMEDIATELY:  *FEVER GREATER THAN 100.5 F  *CHILLS WITH OR WITHOUT FEVER  NAUSEA AND VOMITING THAT IS NOT CONTROLLED WITH YOUR NAUSEA MEDICATION  *UNUSUAL SHORTNESS OF BREATH  *UNUSUAL BRUISING OR BLEEDING  TENDERNESS IN MOUTH AND THROAT WITH OR WITHOUT PRESENCE OF ULCERS  *URINARY PROBLEMS  *BOWEL PROBLEMS  UNUSUAL RASH Items with * indicate a potential emergency and should be followed up as soon as possible.  Feel free to call the clinic you have any questions or concerns. The clinic phone number is [phone].    

## 2013-03-14 NOTE — Telephone Encounter (Signed)
appts made and printed...td 

## 2013-03-15 ENCOUNTER — Telehealth (INDEPENDENT_AMBULATORY_CARE_PROVIDER_SITE_OTHER): Payer: Self-pay

## 2013-03-15 NOTE — Telephone Encounter (Signed)
Pt called back. Ginger Carne will call her back when she is done seeing her patient. She can be reached at [phone].

## 2013-03-15 NOTE — Telephone Encounter (Signed)
LMOM for pt to call me so I can ask her questions about the port removal.

## 2013-03-18 ENCOUNTER — Other Ambulatory Visit (HOSPITAL_COMMUNITY): Payer: Self-pay | Admitting: Internal Medicine

## 2013-03-18 DIAGNOSIS — I429 Cardiomyopathy, unspecified: Secondary | ICD-10-CM

## 2013-03-20 ENCOUNTER — Ambulatory Visit (HOSPITAL_COMMUNITY): Payer: BC Managed Care – PPO | Attending: Adult Health | Admitting: Radiology

## 2013-03-20 DIAGNOSIS — Z9221 Personal history of antineoplastic chemotherapy: Secondary | ICD-10-CM | POA: Insufficient documentation

## 2013-03-20 DIAGNOSIS — I429 Cardiomyopathy, unspecified: Secondary | ICD-10-CM

## 2013-03-20 DIAGNOSIS — I10 Essential (primary) hypertension: Secondary | ICD-10-CM | POA: Insufficient documentation

## 2013-03-20 DIAGNOSIS — C50919 Malignant neoplasm of unspecified site of unspecified female breast: Secondary | ICD-10-CM | POA: Insufficient documentation

## 2013-03-20 NOTE — Telephone Encounter (Signed)
LMOM for pt to call me back.

## 2013-03-20 NOTE — Progress Notes (Signed)
Echocardiogram performed.  

## 2013-03-21 ENCOUNTER — Ambulatory Visit (HOSPITAL_COMMUNITY)
Admission: RE | Admit: 2013-03-21 | Discharge: 2013-03-21 | Disposition: A | Discharge status: Home / Self Care | Payer: BC Managed Care – PPO | Source: Ambulatory Visit | Attending: Internal Medicine | Admitting: Internal Medicine

## 2013-03-21 ENCOUNTER — Encounter (HOSPITAL_COMMUNITY): Payer: Self-pay

## 2013-03-21 ENCOUNTER — Telehealth (HOSPITAL_COMMUNITY): Payer: Self-pay | Admitting: Anesthesiology

## 2013-03-21 VITALS — BP 140/72 | HR 64 | Wt 181.4 lb

## 2013-03-21 DIAGNOSIS — IMO0001 Reserved for inherently not codable concepts without codable children: Secondary | ICD-10-CM | POA: Insufficient documentation

## 2013-03-21 DIAGNOSIS — K219 Gastro-esophageal reflux disease without esophagitis: Secondary | ICD-10-CM | POA: Insufficient documentation

## 2013-03-21 DIAGNOSIS — I10 Essential (primary) hypertension: Secondary | ICD-10-CM | POA: Insufficient documentation

## 2013-03-21 DIAGNOSIS — C50919 Malignant neoplasm of unspecified site of unspecified female breast: Secondary | ICD-10-CM | POA: Insufficient documentation

## 2013-03-21 DIAGNOSIS — C50419 Malignant neoplasm of upper-outer quadrant of unspecified female breast: Secondary | ICD-10-CM

## 2013-03-21 NOTE — Telephone Encounter (Signed)
Called patient with results from ECHO today.  EF 55-60% and lateral s' 8.3. Congratulated her on being done with herceptin treatment and told to call if she has any issues.

## 2013-03-21 NOTE — Progress Notes (Signed)
Patient ID: Maria Mckinney, female   DOB: 06/20/1950, 62 y.o.   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   Treated with neoadjuvant chemotherapy consisting of Taxotere/carboplatinum/Herceptin. Plans to continue Herceptin until November 13,2014.   ECHO 02/21/12  EF 60-65% Lateral S' 7.1 05/30/12  EF 55-60% Lateral S' 7.2 08/30/12   EF 55-60% lateral s' 7.3 12/10/12  EF 55% Lateral s' 7.4 03/21/13          EF 55-60%, Lateral s' 8.3  Follow up: Has completed Herceptin treatment since last visit. Feels pretty good. Denies SOB, orthopnea, CP or edema.   Past Medical History  Diagnosis Date  . HTN (hypertension)   . Fibromyalgia   . GERD (gastroesophageal reflux disease)   . Arthritis   . Anxiety   . Dysrhythmia     irreg hr  . Wears hearing aid     left ear  . 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 PONV (postoperative nausea and vomiting)   . B12 deficiency anemia 10/18/2012    Current Outpatient Prescriptions  Medication Sig Dispense Refill  . bisoprolol-hydrochlorothiazide (ZIAC) 5-6.25 MG per tablet Take 1 tablet by mouth daily.      . cyclobenzaprine (FLEXERIL) 10 MG tablet Take 10 mg by mouth daily as needed for muscle spasms. For muscle pain.      . DULoxetine (CYMBALTA) 60 MG capsule Take 1 capsule (60 mg total) by mouth daily.  30 capsule  3  . esomeprazole (NEXIUM) 40 MG capsule Take 1 capsule (40 mg total) by mouth daily before breakfast.  30 capsule  6  . HYDROcodone-acetaminophen (NORCO/VICODIN) 5-325 MG per tablet Take 1 tablet by mouth every 6 (six) hours as needed for pain.      Marland Kitchen levothyroxine (SYNTHROID, LEVOTHROID) 100 MCG tablet Take 112 mcg by mouth daily.       Marland Kitchen LORazepam (ATIVAN) 0.5 MG tablet Take 1 tablet (0.5 mg total) by mouth every 8 (eight) hours as needed for anxiety.  30 tablet  0  .  meloxicam (MOBIC) 15 MG tablet Take 15 mg by mouth daily.      . mometasone (NASONEX) 50 MCG/ACT nasal spray Place 2 sprays into the nose daily.      . pseudoephedrine (SUDAFED) 30 MG tablet Take 30 mg by mouth every 6 (six) hours as needed for congestion.       . ranitidine (ZANTAC) 300 MG tablet Take 1 tablet by mouth daily.       No current facility-administered medications for this encounter.     Filed Vitals:   03/21/13 1338  BP: 140/72  Pulse: 64  Weight: 181 lb 6.4 oz (82.283 kg)  SpO2: 100%    PHYSICAL EXAM: General:  Well appearing. No respiratory difficulty HEENT: normal Neck: supple. no JVD. Carotids 2+ bilat; no bruits. No lymphadenopathy or thryomegaly appreciated. Cor: PMI nondisplaced. Regular rate & rhythm. No rubs, gallops or murmurs. Lungs: clear Abdomen: soft, nontender, nondistended. No hepatosplenomegaly. No bruits or masses. Good bowel sounds. Extremities: no cyanosis, clubbing, rash, edema Neuro: alert & oriented x 3, cranial nerves grossly intact. moves all 4 extremities w/o difficulty. Affect pleasant.    ASSESSMENT & PLAN:  1. Cancer of upper-outer quadrant L breast - Doing great. She is now done with her Herceptin treatment. Dr.  Bensimhon reviewed her ECHO and EF and lateral S' stable. She does not need to follow up with [place] anymore. Congratulated her on being done and told to call if any issues.   Aundria Rud, NP-C 1:42 PM

## 2013-03-26 NOTE — Telephone Encounter (Signed)
LMOM for pt to call me again so I can ask her questions about how she wants her procedure scheduled with Dr Dwain Sarna. The surgery can't be scheduled till he knows how she wants to have it done either fully a sleep or awake under local.

## 2013-03-27 NOTE — Telephone Encounter (Signed)
Pt prefers general anesthesia for her surgery.

## 2013-03-27 NOTE — Telephone Encounter (Signed)
Sent message to Dr Dwain Sarna in epic of what the pt is requesting and now we are waiting on surgical orders. Erin in our surgery scheduling dept. Is the one who will schedule pt once we receive the orders.

## 2013-03-29 NOTE — Telephone Encounter (Signed)
Will you remind me and I will do for surgery center

## 2013-04-04 ENCOUNTER — Ambulatory Visit (HOSPITAL_BASED_OUTPATIENT_CLINIC_OR_DEPARTMENT_OTHER): Payer: BC Managed Care – PPO

## 2013-04-04 VITALS — BP 146/57 | HR 67 | Temp 98.6°F

## 2013-04-04 DIAGNOSIS — D518 Other vitamin B12 deficiency anemias: Secondary | ICD-10-CM

## 2013-04-04 DIAGNOSIS — D519 Vitamin B12 deficiency anemia, unspecified: Secondary | ICD-10-CM

## 2013-04-04 MED ORDER — CYANOCOBALAMIN 1000 MCG/ML IJ SOLN
1000.0000 ug | Freq: Once | INTRAMUSCULAR | Status: AC
  Administered 2013-04-04: 1000 ug via INTRAMUSCULAR

## 2013-04-08 NOTE — Progress Notes (Signed)
Coming in for local -pac out-to bring meds and ins card-

## 2013-04-11 ENCOUNTER — Encounter (HOSPITAL_BASED_OUTPATIENT_CLINIC_OR_DEPARTMENT_OTHER)
Admission: RE | Disposition: A | Discharge status: Home / Self Care | Payer: Self-pay | Source: Ambulatory Visit | Attending: General Surgery

## 2013-04-11 ENCOUNTER — Ambulatory Visit (HOSPITAL_BASED_OUTPATIENT_CLINIC_OR_DEPARTMENT_OTHER)
Admission: RE | Admit: 2013-04-11 | Discharge: 2013-04-11 | Disposition: A | Discharge status: Home / Self Care | Payer: BC Managed Care – PPO | Source: Ambulatory Visit | Attending: General Surgery | Admitting: General Surgery

## 2013-04-11 ENCOUNTER — Encounter (HOSPITAL_BASED_OUTPATIENT_CLINIC_OR_DEPARTMENT_OTHER): Payer: Self-pay | Admitting: *Deleted

## 2013-04-11 DIAGNOSIS — IMO0001 Reserved for inherently not codable concepts without codable children: Secondary | ICD-10-CM | POA: Insufficient documentation

## 2013-04-11 DIAGNOSIS — Z853 Personal history of malignant neoplasm of breast: Secondary | ICD-10-CM | POA: Insufficient documentation

## 2013-04-11 DIAGNOSIS — D518 Other vitamin B12 deficiency anemias: Secondary | ICD-10-CM | POA: Insufficient documentation

## 2013-04-11 DIAGNOSIS — K219 Gastro-esophageal reflux disease without esophagitis: Secondary | ICD-10-CM | POA: Insufficient documentation

## 2013-04-11 DIAGNOSIS — F411 Generalized anxiety disorder: Secondary | ICD-10-CM | POA: Insufficient documentation

## 2013-04-11 DIAGNOSIS — Z79899 Other long term (current) drug therapy: Secondary | ICD-10-CM | POA: Insufficient documentation

## 2013-04-11 DIAGNOSIS — Z452 Encounter for adjustment and management of vascular access device: Secondary | ICD-10-CM

## 2013-04-11 DIAGNOSIS — I10 Essential (primary) hypertension: Secondary | ICD-10-CM | POA: Insufficient documentation

## 2013-04-11 DIAGNOSIS — Z9010 Acquired absence of unspecified breast and nipple: Secondary | ICD-10-CM | POA: Insufficient documentation

## 2013-04-11 HISTORY — PX: PORT-A-CATH REMOVAL: SHX5289

## 2013-04-11 SURGERY — MINOR REMOVAL PORT-A-CATH
Anesthesia: LOCAL | Site: Chest

## 2013-04-11 MED ORDER — OXYCODONE-ACETAMINOPHEN 10-325 MG PO TABS: 1.0000 | ORAL_TABLET | Freq: Four times a day (QID) | ORAL | Status: AC | PRN

## 2013-04-11 MED ORDER — LIDOCAINE-EPINEPHRINE (PF) 1 %-1:200000 IJ SOLN
INTRAMUSCULAR | Status: AC
  Filled 2013-04-11: qty 10

## 2013-04-11 MED ORDER — LIDOCAINE-EPINEPHRINE 0.5 %-1:200000 IJ SOLN
INTRAMUSCULAR | Status: AC
  Filled 2013-04-11: qty 1

## 2013-04-11 MED ORDER — BUPIVACAINE HCL (PF) 0.25 % IJ SOLN
INTRAMUSCULAR | Status: AC
  Filled 2013-04-11: qty 30

## 2013-04-11 MED ORDER — LIDOCAINE-EPINEPHRINE (PF) 1 %-1:200000 IJ SOLN
INTRAMUSCULAR | Status: DC | PRN
  Administered 2013-04-11: 09:00:00 via INTRAMUSCULAR

## 2013-04-11 SURGICAL SUPPLY — 25 items
BLADE SURG 15 STRL LF DISP TIS (BLADE) ×1 IMPLANT
BLADE SURG 15 STRL SS (BLADE) ×1
CHLORAPREP W/TINT 26ML (MISCELLANEOUS) ×2 IMPLANT
COVER MAYO STAND STRL (DRAPES) ×2 IMPLANT
COVER TABLE BACK 60X90 (DRAPES) ×2 IMPLANT
DECANTER SPIKE VIAL GLASS SM (MISCELLANEOUS) IMPLANT
DERMABOND ADVANCED (GAUZE/BANDAGES/DRESSINGS) ×1
DERMABOND ADVANCED .7 DNX12 (GAUZE/BANDAGES/DRESSINGS) ×1 IMPLANT
DRAPE PED LAPAROTOMY (DRAPES) ×2 IMPLANT
ELECT COATED BLADE 2.86 ST (ELECTRODE) ×2 IMPLANT
ELECT REM PT RETURN 9FT ADLT (ELECTROSURGICAL) ×2
ELECTRODE REM PT RTRN 9FT ADLT (ELECTROSURGICAL) ×1 IMPLANT
GLOVE BIO SURGEON STRL SZ 6.5 (GLOVE) ×2 IMPLANT
GLOVE BIO SURGEON STRL SZ7 (GLOVE) ×2 IMPLANT
GLOVE BIOGEL PI IND STRL 7.5 (GLOVE) ×1 IMPLANT
GLOVE BIOGEL PI INDICATOR 7.5 (GLOVE) ×1
GOWN PREVENTION PLUS XLARGE (GOWN DISPOSABLE) ×4 IMPLANT
NEEDLE HYPO 25X1 1.5 SAFETY (NEEDLE) ×2 IMPLANT
PACK BASIN DAY SURGERY FS (CUSTOM PROCEDURE TRAY) ×2 IMPLANT
PENCIL BUTTON HOLSTER BLD 10FT (ELECTRODE) ×2 IMPLANT
SUT MON AB 4-0 PC3 18 (SUTURE) ×2 IMPLANT
SUT VIC AB 3-0 SH 27 (SUTURE) ×1
SUT VIC AB 3-0 SH 27X BRD (SUTURE) ×1 IMPLANT
SYR CONTROL 10ML LL (SYRINGE) ×2 IMPLANT
TOWEL OR 17X24 6PK STRL BLUE (TOWEL DISPOSABLE) ×2 IMPLANT

## 2013-04-11 NOTE — H&P (Signed)
Maria Mckinney is an 62 y.o. female.   Chief Complaint: no longer needs venous access HPI:62 yof who presents after undergoing adjuvant treatment for breast cancer.  Desires port removal.  Past Medical History  Diagnosis Date  . HTN (hypertension)   . Fibromyalgia   . GERD (gastroesophageal reflux disease)   . Arthritis   . Anxiety   . Dysrhythmia     irreg hr  . Wears hearing aid     left ear  . 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 PONV (postoperative nausea and vomiting)   . B12 deficiency anemia 10/18/2012    Past Surgical History  Procedure Laterality Date  . Appendectomy    . Combined hysteroscopy diagnostic / d&c    . Cochlear implant    . Portacath placement  02/22/2012    Procedure: INSERTION PORT-A-CATH;  Surgeon: [name], MD;  Location: [facility];  Service: General;  Laterality: Right;  . Breast lumpectomy with needle localization and axillary sentinel lymph node bx Left 06/27/2012    Procedure: BREAST LUMPECTOMY WITH NEEDLE LOCALIZATION AND AXILLARY SENTINEL LYMPH NODE BIOPSY;  Surgeon: [name], MD;  Location: [facility] OR;  Service: General;  Laterality: Left;  left breast wire localized at [facility] lumpectomy with left axillary sentinel node biopsy nuclear med 1400    Family History  Problem Relation Age of Onset  . Cancer Mother     "tumor of face removed"  . Cancer Father     kidney removed  . Cancer Maternal Aunt     stomach & Pancreatic  . Cancer Paternal Uncle     pancreatic   Social History:  reports that she has never smoked. She has never used smokeless tobacco. She reports that she drinks alcohol. She reports that she does not use illicit drugs.  Allergies:  Allergies  Allergen Reactions  . Acyclovir And Related   . Ciprofloxacin Hcl Rash  . Bactrim [Sulfamethoxazole-Trimethoprim]   . Codeine Nausea Only    Medications Prior to Admission  Medication Sig Dispense Refill  . bisoprolol-hydrochlorothiazide (ZIAC)  5-6.25 MG per tablet Take 1 tablet by mouth daily.      . cyclobenzaprine (FLEXERIL) 10 MG tablet Take 10 mg by mouth daily as needed for muscle spasms. For muscle pain.      . DULoxetine (CYMBALTA) 60 MG capsule Take 1 capsule (60 mg total) by mouth daily.  30 capsule  3  . esomeprazole (NEXIUM) 40 MG capsule Take 1 capsule (40 mg total) by mouth daily before breakfast.  30 capsule  6  . HYDROcodone-acetaminophen (NORCO/VICODIN) 5-325 MG per tablet Take 1 tablet by mouth every 6 (six) hours as needed for pain.      Marland Kitchen levothyroxine (SYNTHROID, LEVOTHROID) 100 MCG tablet Take 112 mcg by mouth daily.       Marland Kitchen LORazepam (ATIVAN) 0.5 MG tablet Take 1 tablet (0.5 mg total) by mouth every 8 (eight) hours as needed for anxiety.  30 tablet  0  . meloxicam (MOBIC) 15 MG tablet Take 15 mg by mouth daily.      . mometasone (NASONEX) 50 MCG/ACT nasal spray Place 2 sprays into the nose daily.      . pseudoephedrine (SUDAFED) 30 MG tablet Take 30 mg by mouth every 6 (six) hours as needed for congestion.       . ranitidine (ZANTAC) 300 MG tablet Take 1 tablet by mouth daily.        No results  found for this or any previous visit (from the past 48 hour(s)). No results found.  ROS  Blood pressure 139/78, pulse 63, temperature 98.7 F (37.1 C), temperature source Oral, resp. rate 16, SpO2 96.00%. Physical Exam  Constitutional: She appears well-developed and well-nourished.  Cardiovascular: Normal rate and regular rhythm.   Respiratory: Effort normal and breath sounds normal.       Assessment/Plan Port removal  We discussed port removal under local anesthesia.   Maria Mckinney 04/11/2013, 9:05 AM

## 2013-04-11 NOTE — Op Note (Signed)
Preoperative diagnosis: Breast cancer, no longer needs venous access Postoperative diagnosis: saa Procedure: right subclavian port removal Surgeon: Dr Harden Mo Anesthesia: Local Specimens: None Estimated blood loss: Minimal Drains: None Complications: None Sponge count correct Disposition to short stay stable  Indications: This is 62 year old female in no further treatment for breast cancer. She has completed her adjuvant therapy and desires her port to be removed.  Procedure: After informed consent was obtained the patient was taken to the operating room. Her right chest was prepped and draped in the standard sterile surgical fashion. A surgical timeout was then performed.  I used a mixture of 1% lidocaine with epinephrine and quarter percent Marcaine to anesthetize the area. I then made an incision. I then removed her port in its entirety. Hemostasis was then obtained. I closed down the tract with 3-0 Vicryl suture. Then I closed the skin with 3-0 Vicryl, 4 Monocryl, and Dermabond. She tolerated this well and was transferred to recovery.

## 2013-04-12 ENCOUNTER — Encounter (HOSPITAL_BASED_OUTPATIENT_CLINIC_OR_DEPARTMENT_OTHER): Payer: Self-pay | Admitting: General Surgery

## 2013-04-18 ENCOUNTER — Telehealth (INDEPENDENT_AMBULATORY_CARE_PROVIDER_SITE_OTHER): Payer: Self-pay | Admitting: *Deleted

## 2013-04-18 NOTE — Telephone Encounter (Signed)
LMOM for pt to return my call.  I was calling to notify her of her postop appt with Dr. Dwain Sarna on 05/06/13 @ 4:10pm.

## 2013-05-03 ENCOUNTER — Ambulatory Visit (HOSPITAL_BASED_OUTPATIENT_CLINIC_OR_DEPARTMENT_OTHER): Payer: BC Managed Care – PPO

## 2013-05-03 VITALS — BP 138/53 | HR 69 | Temp 98.9°F

## 2013-05-03 DIAGNOSIS — D518 Other vitamin B12 deficiency anemias: Secondary | ICD-10-CM

## 2013-05-03 DIAGNOSIS — D519 Vitamin B12 deficiency anemia, unspecified: Secondary | ICD-10-CM

## 2013-05-03 MED ORDER — CYANOCOBALAMIN 1000 MCG/ML IJ SOLN
1000.0000 ug | Freq: Once | INTRAMUSCULAR | Status: AC
  Administered 2013-05-03: 1000 ug via INTRAMUSCULAR

## 2013-05-06 ENCOUNTER — Ambulatory Visit (INDEPENDENT_AMBULATORY_CARE_PROVIDER_SITE_OTHER): Payer: BC Managed Care – PPO | Admitting: General Surgery

## 2013-05-06 ENCOUNTER — Encounter (INDEPENDENT_AMBULATORY_CARE_PROVIDER_SITE_OTHER): Payer: Self-pay | Admitting: General Surgery

## 2013-05-06 VITALS — BP 144/78 | HR 72 | Resp 18 | Ht 62.0 in | Wt 182.0 lb

## 2013-05-06 DIAGNOSIS — Z09 Encounter for follow-up examination after completed treatment for conditions other than malignant neoplasm: Secondary | ICD-10-CM

## 2013-05-06 NOTE — Patient Instructions (Signed)

## 2013-05-06 NOTE — Progress Notes (Signed)
Subjective:     Patient ID: Maria Mckinney, female   DOB: [birth year], 63 y.o.   MRN: [id number]  HPI 63 year old female in no well from her treatment for breast cancer. I took her port out recently. She has done well from that today returns for a postoperative visit. She has no complaints.  Review of Systems     Objective:   Physical Exam Healing right chest incision without infection    Assessment:     S/p port removal     Plan:     She can return to normal activity. She is due to see medical oncology in 6 months and I discussed with her follow up for breast cancer which should include monthly self exams, annual mammography, and about every 6 months clinical exams. I told her I would be happy to see her in one year but if she is going to see medical oncology at those intervalsshe can follow up with med oncology only as she has to drive ways to get here

## 2013-05-30 ENCOUNTER — Ambulatory Visit (HOSPITAL_BASED_OUTPATIENT_CLINIC_OR_DEPARTMENT_OTHER): Payer: BC Managed Care – PPO

## 2013-05-30 VITALS — BP 125/46 | HR 64 | Temp 98.1°F

## 2013-05-30 DIAGNOSIS — E538 Deficiency of other specified B group vitamins: Secondary | ICD-10-CM

## 2013-05-30 DIAGNOSIS — D519 Vitamin B12 deficiency anemia, unspecified: Secondary | ICD-10-CM

## 2013-05-30 MED ORDER — CYANOCOBALAMIN 1000 MCG/ML IJ SOLN
1000.0000 ug | Freq: Once | INTRAMUSCULAR | Status: AC
  Administered 2013-05-30: 1000 ug via INTRAMUSCULAR

## 2013-06-25 ENCOUNTER — Telehealth: Payer: Self-pay | Admitting: Oncology

## 2013-06-25 NOTE — Telephone Encounter (Signed)
pt called to r/s inj to tomorrow...done...per [facility] Headings ok to move

## 2013-06-26 ENCOUNTER — Ambulatory Visit (HOSPITAL_BASED_OUTPATIENT_CLINIC_OR_DEPARTMENT_OTHER): Payer: Medicare Other

## 2013-06-26 VITALS — BP 150/66 | HR 73 | Temp 98.8°F

## 2013-06-26 DIAGNOSIS — D519 Vitamin B12 deficiency anemia, unspecified: Secondary | ICD-10-CM

## 2013-06-26 DIAGNOSIS — D518 Other vitamin B12 deficiency anemias: Secondary | ICD-10-CM

## 2013-06-26 MED ORDER — CYANOCOBALAMIN 1000 MCG/ML IJ SOLN
1000.0000 ug | Freq: Once | INTRAMUSCULAR | Status: AC
  Administered 2013-06-26: 1000 ug via INTRAMUSCULAR

## 2013-06-27 ENCOUNTER — Ambulatory Visit: Payer: BC Managed Care – PPO

## 2013-07-02 IMAGING — CT CT HEAD WO/W CM
1 of 2 series · 13 of 30 positions shown, 17 images · IV contrast (OMNIPAQUE)
Comparison: None.

CLINICAL DATA: 61-year-old female with breast cancer.  Chemotherapy
in progress.  Nausea, vomiting, dizziness, cochlear implant.

CT HEAD WITHOUT AND WITH CONTRAST
TECHNIQUE: Contiguous axial images were obtained from the base of
the skull through the vertex without and with intravenous contrast.
Contrast: 100mL OMNIPAQUE IOHEXOL 300 MG/ML  SOLN

[Series 2: head w/o 4.8 h45s · axial · non-contrast · 0.43mm/px · z∈[-126,-12]mm · 13 of 30 slices shown, 17 images]
[im 3/30  brain]
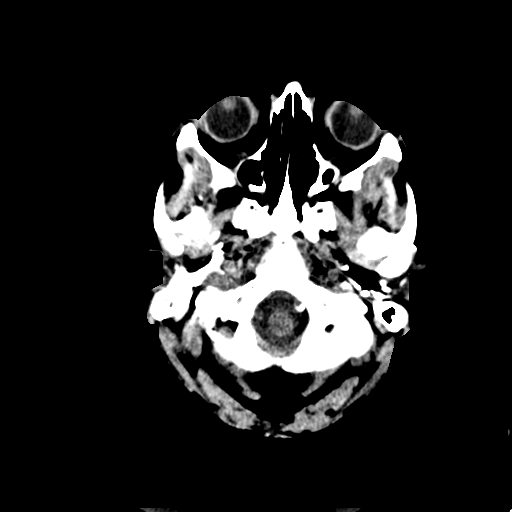
[im 3/30  bone]
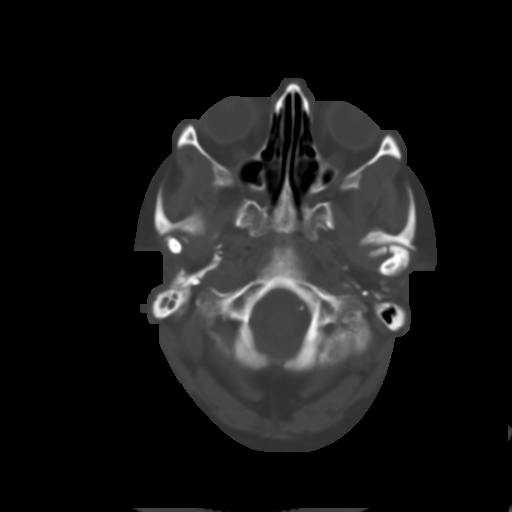
[im 5/30  brain]
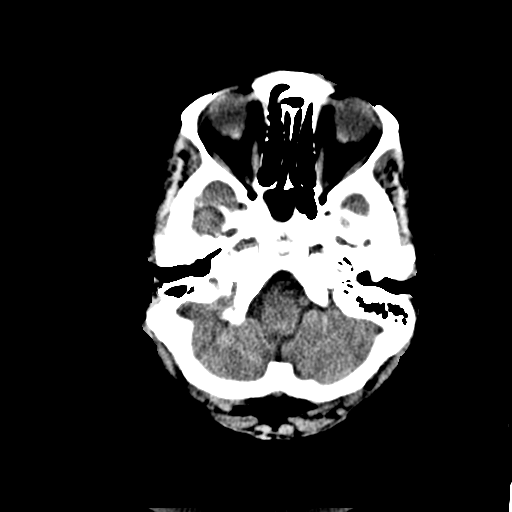
[im 7/30  brain]
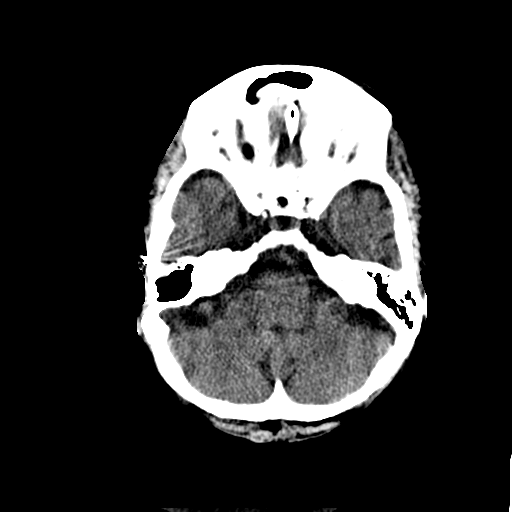
[im 9/30  brain]
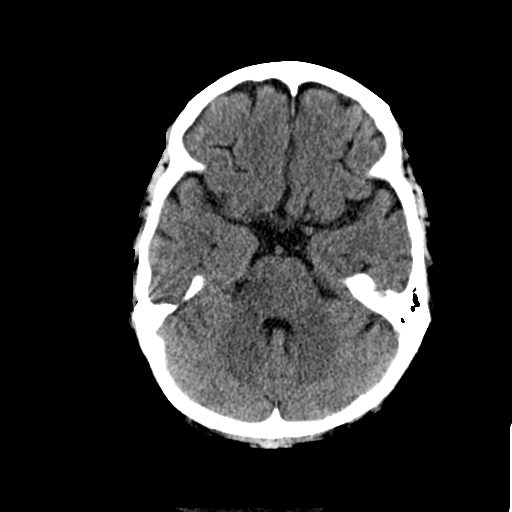
[im 11/30  brain]
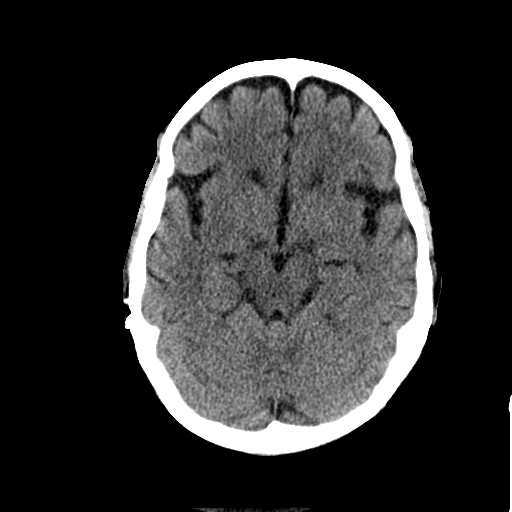
[im 11/30  bone]
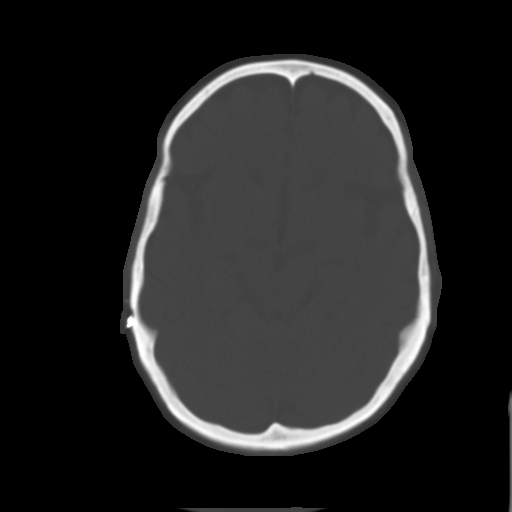
[im 13/30  brain]
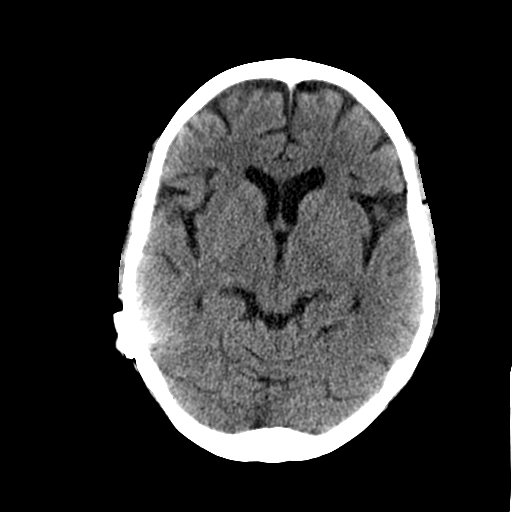
[im 15/30  brain]
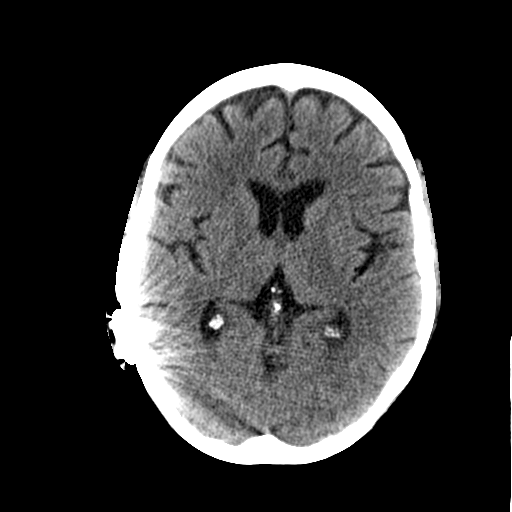
[im 17/30  brain]
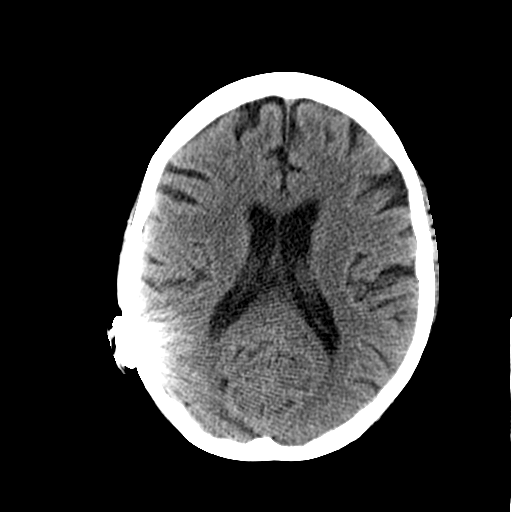
[im 19/30  brain]
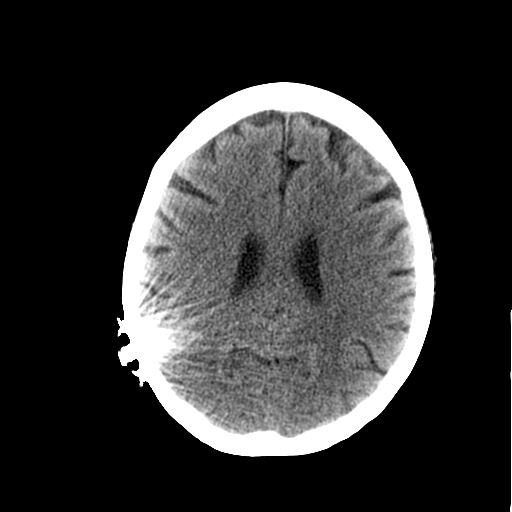
[im 19/30  bone]
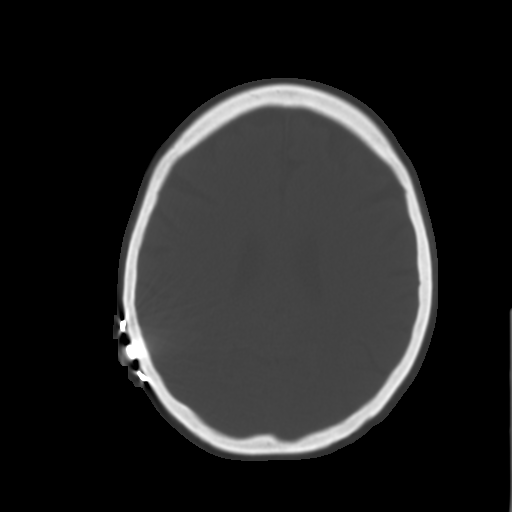
[im 21/30  brain]
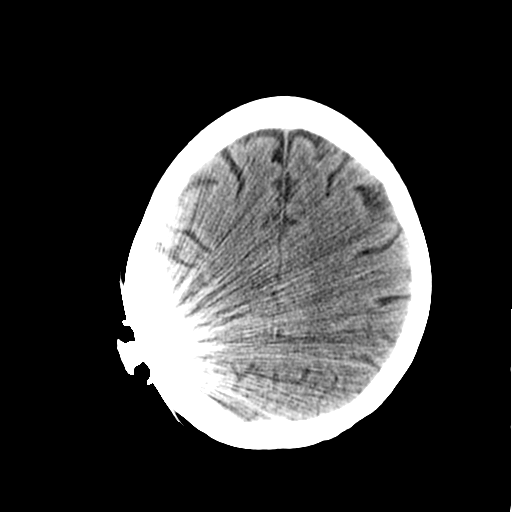
[im 23/30  brain]
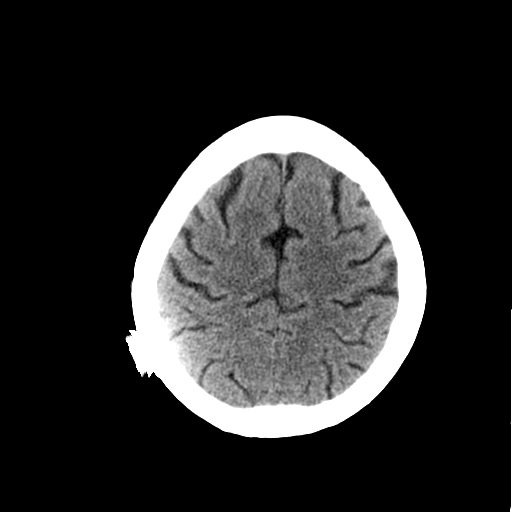
[im 25/30  brain]
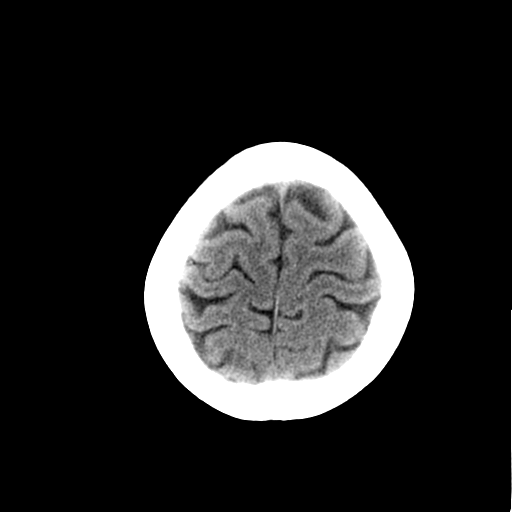
[im 27/30  brain]
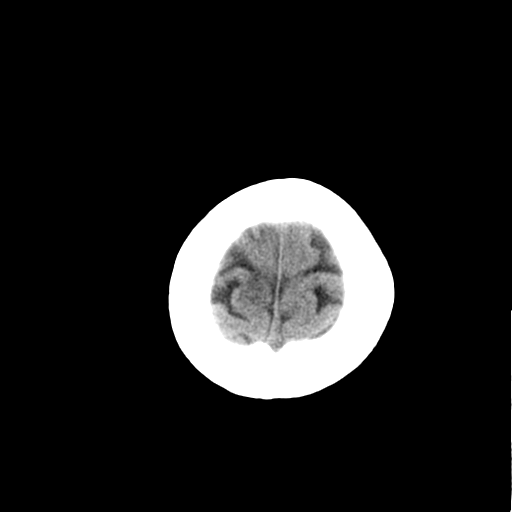
[im 27/30  bone]
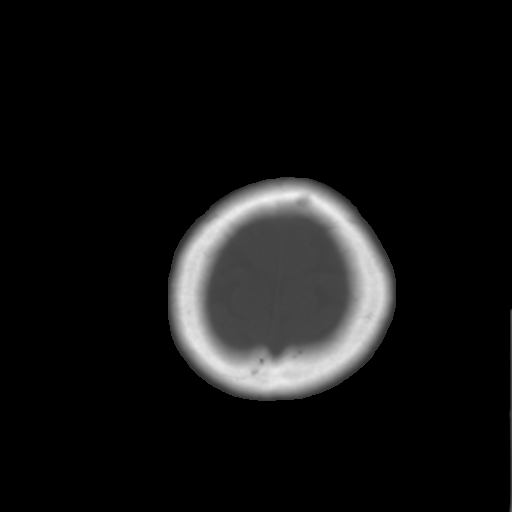

[13 of 30 positions shown; findings below may reference images not displayed]

FINDINGS: Sequelae of right mastoidectomy and cochlear implant.  No
adverse features identified.  Bilateral tympanic cavities and left
mastoids appear clear.

Mucosal thickening and bubbly opacity in the right sphenoid sinus.
Other visible paranasal sinuses appear clear.

No acute or suspicious osseous lesion.

No acute orbit or scalp soft tissue findings.

Mild Calcified atherosclerosis at the skull base.  Cerebral volume
is within normal limits for age.  No midline shift,
ventriculomegaly, mass effect, evidence of mass lesion,
intracranial hemorrhage or evidence of cortically based acute
infarction.  Gray-white matter differentiation is within normal
limits throughout the brain.  No abnormal enhancement identified.
Streak artifact related to the cochlear implant generator.
Visualized major vascular structures appear to be patent.
IMPRESSION: Negative other than previous right mastoidectomy and cochlear
implant.

## 2013-07-25 ENCOUNTER — Ambulatory Visit (HOSPITAL_BASED_OUTPATIENT_CLINIC_OR_DEPARTMENT_OTHER): Payer: Medicare Other

## 2013-07-25 VITALS — BP 134/73 | HR 78 | Temp 98.3°F

## 2013-07-25 DIAGNOSIS — D519 Vitamin B12 deficiency anemia, unspecified: Secondary | ICD-10-CM

## 2013-07-25 DIAGNOSIS — E538 Deficiency of other specified B group vitamins: Secondary | ICD-10-CM

## 2013-07-25 MED ORDER — CYANOCOBALAMIN 1000 MCG/ML IJ SOLN
1000.0000 ug | Freq: Once | INTRAMUSCULAR | Status: AC
  Administered 2013-07-25: 1000 ug via INTRAMUSCULAR

## 2013-08-22 ENCOUNTER — Ambulatory Visit (HOSPITAL_BASED_OUTPATIENT_CLINIC_OR_DEPARTMENT_OTHER): Payer: Medicare Other

## 2013-08-22 VITALS — BP 134/60 | HR 67 | Temp 98.2°F

## 2013-08-22 DIAGNOSIS — D519 Vitamin B12 deficiency anemia, unspecified: Secondary | ICD-10-CM

## 2013-08-22 DIAGNOSIS — E538 Deficiency of other specified B group vitamins: Secondary | ICD-10-CM

## 2013-08-22 MED ORDER — CYANOCOBALAMIN 1000 MCG/ML IJ SOLN
1000.0000 ug | Freq: Once | INTRAMUSCULAR | Status: AC
  Administered 2013-08-22: 1000 ug via INTRAMUSCULAR

## 2013-08-22 NOTE — Patient Instructions (Signed)
Vitamin B12 Injections Every person needs vitamin B12. A deficiency develops when the body does not get enough of it. One way to overcome this is by getting B12 shots (injections). A B12 shot puts the vitamin directly into muscle tissue. This avoids any problems your body might have in absorbing it from food or a pill. In some people, the body has trouble using the vitamin correctly. This can cause a B12 deficiency. Not consuming enough of the vitamin can also cause a deficiency. Getting enough vitamin B12 can be hard for elderly people. Sometimes, they do not eat a well-balanced diet. The elderly are also more likely than younger people to have medical conditions or take medications that can lead to a deficiency. WHAT DOES VITAMIN B12 DO? Vitamin B12 does many things to help the body work right:  It helps the body make healthy red blood cells.  It helps maintain nerve cells.  It is involved in the body's process of converting food into energy (metabolism).  It is needed to make the genetic material in all cells (DNA). VITAMIN B12 FOOD SOURCES Most people get plenty of vitamin B12 through the foods they eat. It is present in:  Meat, fish, poultry, and eggs.  Milk and milk products.  It also is added when certain foods are made, including some breads, cereals and yogurts. The food is then called "fortified". CAUSES The most common causes of vitamin B12 deficiency are:  Pernicious anemia. The condition develops when the body cannot make enough healthy red blood cells. This stems from a lack of a protein made in the stomach (intrinsic factor). People without this protein cannot absorb enough vitamin B12 from food.  Malabsorption. This is when the body cannot absorb the vitamin. It can be caused by:  Pernicious anemia.  Surgery to remove part or all of the stomach can lead to malabsorption. Removal of part or all of the small intestine can also cause malabsorption.  Vegetarian diet.  People who are strict about not eating foods from animals could have trouble taking in enough vitamin B12 from diet alone.  Medications. Some medicines have been linked to B12 deficiency, such as Metformin (a drug prescribed for type 2 diabetes). Long-term use of stomach acid suppressants also can keep the vitamin from being absorbed.  Intestinal problems such as inflammatory bowel disease. If there are problems in the digestive tract, vitamin B12 may not be absorbed in good enough amounts. SYMPTOMS People who do not get enough B12 can develop problems. These can include:  Anemia. This is when the body has too few red blood cells. Red blood cells carry oxygen to the rest of the body. Without a healthy supply of red blood cells, people can feel:  Tired (fatigued).  Weak.  Severe anemia can cause:  Shortness of breath.  Dizziness.  Rapid heart rate.  Paleness.  Other Vitamin B12 deficiency symptoms include:  Diarrhea.  Numbness or tingling in the hands or feet.  Loss of appetite.  Confusion.  Sores on the tongue or in the mouth. LET YOUR CAREGIVER KNOW ABOUT:  Any allergies. It is very important to know if you are allergic or sensitive to cobalt. Vitamin B12 contains cobalt.  Any history of kidney disease.  All medications you are taking. Include prescription and over-the-counter medicines, herbs and creams.  Whether you are pregnant or breast-feeding.  If you have Leber's disease, a hereditary eye condition, vitamin B12 could make it worse. RISKS AND COMPLICATIONS Reactions to an injection are   usually temporary. They might include:  Pain at the injection site.  Redness, swelling or tenderness at the site.  Headache, dizziness or weakness.  Nausea, upset stomach or diarrhea.  Numbness or tingling.  Fever.  Joint pain.  Itching or rash. If a reaction does not go away in a short while, talk with your healthcare provider. A change in the way the shots are  given, or where they are given, might need to be made. BEFORE AN INJECTION To decide whether B12 injections are right for you, your healthcare provider will probably:  Ask about your medical history.  Ask questions about your diet.  Ask about symptoms such as:  Have you felt weak?  Do you feel unusually tired?  Do you get dizzy?  Order blood tests. These may include a test to:  Check the level of red cells in your blood.  Measure B12 levels.  Check for the presence of intrinsic factor. VITAMIN B12 INJECTIONS How often you will need a vitamin B12 injection will depend on how severe your deficiency is. This also will affect how long you will need to get them. People with pernicious anemia usually get injections for their entire life. Others might get them for a shorter period. For many people, injections are given daily or weekly for several weeks. Then, once B12 levels are normal, injections are given just once a month. If the cause of the deficiency can be fixed, the injections can be stopped. Talk with your healthcare provider about what you should expect. For an injection:  The injection site will be cleaned with an alcohol swab.  Your healthcare provider will insert a needle directly into a muscle. Most any muscle can be used. Most often, an arm muscle is used. A buttocks muscle can also be used. Many people say shots in that area are less painful.  A small adhesive bandage may be put over the injection site. It usually can be taken off in an hour or less. Injections can be given by your healthcare provider. In some cases, family members give them. Sometimes, people give them to themselves. Talk with your healthcare provider about what would be best for you. If someone other than your healthcare provider will be giving the shots, the person will need to be trained to give them correctly. HOME CARE INSTRUCTIONS   You can remove the adhesive bandage within an hour of getting a  shot.  You should be able to go about your normal activities right away.  Avoid drinking large amounts of alcohol while taking vitamin B12 shots. Alcohol can interfere with the body's use of the vitamin. SEEK MEDICAL CARE IF:   Pain, redness, swelling or tenderness at the injection site does not get better or gets worse.  Headache, dizziness or weakness does not go away.  You develop a fever of more than 100.5 F (38.1 C). SEEK IMMEDIATE MEDICAL CARE IF:   You have chest pain.  You develop shortness of breath.  You have muscle weakness that gets worse.  You develop numbness, weakness or tingling on one side or one area of the body.  You have symptoms of an allergic reaction, such as:  Hives.  Difficulty breathing.  Swelling of the lips, face, tongue or throat.  You develop a fever of more than 102.0 F (38.9 C). MAKE SURE YOU:   Understand these instructions.  Will watch your condition.  Will get help right away if you are not doing well or get worse. Document   Released: 07/15/2008 Document Revised: 07/11/2011 Document Reviewed: 07/15/2008 [employer] Patient Information 2014 [employer].  

## 2013-09-06 ENCOUNTER — Telehealth: Payer: Self-pay

## 2013-09-06 NOTE — Telephone Encounter (Signed)
LMOVM - KK out on LOA.  Reschedule lab at 215, AJ 245 injection 330.  Pt to call clinic to reschedule if inconvenient.

## 2013-09-19 ENCOUNTER — Other Ambulatory Visit: Payer: Self-pay | Admitting: *Deleted

## 2013-09-19 DIAGNOSIS — C50419 Malignant neoplasm of upper-outer quadrant of unspecified female breast: Secondary | ICD-10-CM

## 2013-09-19 DIAGNOSIS — D519 Vitamin B12 deficiency anemia, unspecified: Secondary | ICD-10-CM

## 2013-09-19 DIAGNOSIS — D638 Anemia in other chronic diseases classified elsewhere: Secondary | ICD-10-CM

## 2013-09-19 NOTE — Progress Notes (Signed)
Call from pharmacy that patient needs Vitamin B12 orders. Entered for MD release.

## 2013-09-20 ENCOUNTER — Ambulatory Visit: Payer: BC Managed Care – PPO | Admitting: Oncology

## 2013-09-20 ENCOUNTER — Encounter: Payer: Self-pay | Admitting: Physician Assistant

## 2013-09-20 ENCOUNTER — Other Ambulatory Visit: Payer: Self-pay

## 2013-09-20 ENCOUNTER — Telehealth: Payer: Self-pay | Admitting: Oncology

## 2013-09-20 ENCOUNTER — Other Ambulatory Visit: Payer: BC Managed Care – PPO

## 2013-09-20 ENCOUNTER — Other Ambulatory Visit (HOSPITAL_BASED_OUTPATIENT_CLINIC_OR_DEPARTMENT_OTHER): Payer: Medicare Other

## 2013-09-20 ENCOUNTER — Ambulatory Visit (HOSPITAL_BASED_OUTPATIENT_CLINIC_OR_DEPARTMENT_OTHER): Payer: Medicare Other | Admitting: Physician Assistant

## 2013-09-20 ENCOUNTER — Ambulatory Visit: Payer: Self-pay

## 2013-09-20 ENCOUNTER — Ambulatory Visit: Payer: BC Managed Care – PPO

## 2013-09-20 VITALS — BP 134/87 | HR 77 | Temp 98.3°F | Resp 20 | Ht 62.0 in | Wt 190.5 lb

## 2013-09-20 DIAGNOSIS — C50419 Malignant neoplasm of upper-outer quadrant of unspecified female breast: Secondary | ICD-10-CM

## 2013-09-20 DIAGNOSIS — D519 Vitamin B12 deficiency anemia, unspecified: Secondary | ICD-10-CM

## 2013-09-20 DIAGNOSIS — E538 Deficiency of other specified B group vitamins: Secondary | ICD-10-CM

## 2013-09-20 DIAGNOSIS — D696 Thrombocytopenia, unspecified: Secondary | ICD-10-CM

## 2013-09-20 DIAGNOSIS — Z171 Estrogen receptor negative status [ER-]: Secondary | ICD-10-CM

## 2013-09-20 LAB — COMPREHENSIVE METABOLIC PANEL (CC13)
ALK PHOS: 114 U/L (ref 40–150)
ALT: 65 U/L — ABNORMAL HIGH (ref 0–55)
ANION GAP: 12 meq/L — AB (ref 3–11)
AST: 36 U/L — ABNORMAL HIGH (ref 5–34)
Albumin: 4 g/dL (ref 3.5–5.0)
BILIRUBIN TOTAL: 0.33 mg/dL (ref 0.20–1.20)
BUN: 17.1 mg/dL (ref 7.0–26.0)
CO2: 27 meq/L (ref 22–29)
CREATININE: 1.3 mg/dL — AB (ref 0.6–1.1)
Calcium: 9.7 mg/dL (ref 8.4–10.4)
Chloride: 104 mEq/L (ref 98–109)
GLUCOSE: 117 mg/dL (ref 70–140)
Potassium: 3.6 mEq/L (ref 3.5–5.1)
Sodium: 142 mEq/L (ref 136–145)
Total Protein: 6.7 g/dL (ref 6.4–8.3)

## 2013-09-20 LAB — CBC WITH DIFFERENTIAL/PLATELET
BASO%: 1 % (ref 0.0–2.0)
BASOS ABS: 0 10*3/uL (ref 0.0–0.1)
EOS ABS: 0.2 10*3/uL (ref 0.0–0.5)
EOS%: 4.6 % (ref 0.0–7.0)
HEMATOCRIT: 37.2 % (ref 34.8–46.6)
HGB: 12.7 g/dL (ref 11.6–15.9)
LYMPH%: 41 % (ref 14.0–49.7)
MCH: 31.7 pg (ref 25.1–34.0)
MCHC: 34.1 g/dL (ref 31.5–36.0)
MCV: 92.9 fL (ref 79.5–101.0)
MONO#: 0.3 10*3/uL (ref 0.1–0.9)
MONO%: 8 % (ref 0.0–14.0)
NEUT%: 45.4 % (ref 38.4–76.8)
NEUTROS ABS: 1.6 10*3/uL (ref 1.5–6.5)
PLATELETS: 122 10*3/uL — AB (ref 145–400)
RBC: 4 10*6/uL (ref 3.70–5.45)
RDW: 13.6 % (ref 11.2–14.5)
WBC: 3.5 10*3/uL — AB (ref 3.9–10.3)
lymph#: 1.5 10*3/uL (ref 0.9–3.3)

## 2013-09-20 LAB — VITAMIN B12: VITAMIN B 12: 463 pg/mL (ref 211–911)

## 2013-09-20 MED ORDER — CYANOCOBALAMIN 1000 MCG/ML IJ SOLN
1000.0000 ug | Freq: Once | INTRAMUSCULAR | Status: AC
  Administered 2013-09-20: 1000 ug via INTRAMUSCULAR

## 2013-09-20 NOTE — Telephone Encounter (Signed)
gv and printed appts sched and avs for pt fro June thru [facility]

## 2013-09-20 NOTE — Progress Notes (Signed)
Maria Mckinney [id number] 11-21-1950 63 y.o. 09/20/2013 3:45 PM  CC  [name], MD Dr. Cristela Blue [name] [address] [place] [address] [name] [name] 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  PRIOR THERAPY:  #1Patient was originally seen in the multidisciplinary breast clinic for new diagnosis of left breast cancer that was 32mm in the upper outer quadrant of the left breast.  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  There was an additional 4 cm area of normal signal.  Patient did not get an MRI secondary to cochlear implant.  She desires breast conservation.    #2 Patient is s/p port-a-cath placement for neoadjuvant chemotherapy consisting of Taxotere/carboplatinum/Herceptin.  She began cycle #1 on 02/29/2012.  She received Taxotere and carboplatinum every 21 days with Herceptin giving weekly.  She completed a total of 4 cycles of Taxotere and Carboplatinum on 05/03/12.    #3 Herceptin every 3 weeks beginning 05/23/12 - 03/14/13  #4 Lumpectomy by Dr. Donne Hazel on 06/27/2012 showed complete pathologic response to neoadjuvant chemotherapy.  Sentinel nodes were negative.  #5 Patient underwent radiation therapy from on 08/14/12 - 09/28/12.  She received this in [place].    CURRENT THERAPY: Observation  INTERVAL HISTORY: Patient is seen in followup today. She received her last dose of Herceptin io 03/14/2013.  Overall she's doing well with the exception of decreased energy. She is due for her monthly B12 injection today. She is status post bilateral cataract surgery. The left eye was done on 08/12/2013 and the right eye on 08/19/2013. She denies any fevers chills night sweats headaches shortness of breath chest pains palpitations no myalgias and arthralgias no peripheral  paresthesias. She denies any abdominal pain no diarrhea or constipation. No bleeding problems or easy bruising. Her mammogram on 02/20/2013 showed no evidence of malignancy. Remainder of the 10 point review of systems is negative.     Past Medical History: Past Medical History  Diagnosis Date  . HTN (hypertension)   . Fibromyalgia   . GERD (gastroesophageal reflux disease)   . Arthritis   . Anxiety   . Dysrhythmia     irreg hr  . Wears hearing aid     left ear  . 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 PONV (postoperative nausea and vomiting)   . B12 deficiency anemia 10/18/2012    Past Surgical History: Past Surgical History  Procedure Laterality Date  . Appendectomy    . Combined hysteroscopy diagnostic / d&c    . Cochlear implant    . Portacath placement  02/22/2012    Procedure: INSERTION PORT-A-CATH;  Surgeon: Rolm Bookbinder, MD;  Location: [facility];  Service: General;  Laterality: Right;  . Breast lumpectomy with needle localization and axillary sentinel lymph node bx Left 06/27/2012    Procedure: BREAST LUMPECTOMY WITH NEEDLE LOCALIZATION AND AXILLARY SENTINEL LYMPH NODE BIOPSY;  Surgeon: Rolm Bookbinder, MD;  Location: [facility];  Service: General;  Laterality: Left;  left breast wire localized at [facility] lumpectomy with left axillary sentinel node biopsy nuclear med 1400  . Port-a-cath removal N/A 04/11/2013    Procedure: MINOR REMOVAL PORT-A-CATH;  Surgeon: Rolm Bookbinder, MD;  Location: [facility];  Service: General;  Laterality: N/A;    Family History: Family History  Problem Relation Age of Onset  .  Cancer Mother     "tumor of face removed"  . Cancer Father     kidney removed  . Cancer Maternal Aunt     stomach & Pancreatic  . Cancer Paternal Uncle     pancreatic    Social History History  Substance Use Topics  . Smoking status: Never Smoker   . Smokeless tobacco: Never Used  . Alcohol Use: 0.0 oz/week    0-1  Glasses of wine per week    Allergies: Allergies  Allergen Reactions  . Acyclovir And Related   . Ciprofloxacin Hcl Rash  . Bactrim [Sulfamethoxazole-Trimethoprim]   . Codeine Nausea Only    Current Medications: Current Outpatient Prescriptions  Medication Sig Dispense Refill  . bisoprolol-hydrochlorothiazide (ZIAC) 5-6.25 MG per tablet Take 1 tablet by mouth daily.      . cyclobenzaprine (FLEXERIL) 10 MG tablet Take 10 mg by mouth daily as needed for muscle spasms. For muscle pain.      . DULoxetine (CYMBALTA) 60 MG capsule Take 1 capsule (60 mg total) by mouth daily.  30 capsule  3  . esomeprazole (NEXIUM) 40 MG capsule Take 22.3 mg by mouth daily before breakfast.      . gentamicin (GARAMYCIN) 0.3 % ophthalmic solution       . HYDROcodone-acetaminophen (NORCO/VICODIN) 5-325 MG per tablet Take 1 tablet by mouth every 6 (six) hours as needed for pain.      Marland Kitchen levothyroxine (SYNTHROID, LEVOTHROID) 100 MCG tablet Take 112 mcg by mouth daily.       . meloxicam (MOBIC) 15 MG tablet Take 15 mg by mouth daily.      . mometasone (NASONEX) 50 MCG/ACT nasal spray Place 2 sprays into the nose daily.      . potassium chloride (K-DUR) 10 MEQ tablet       . PROLENSA 0.07 % SOLN       . LORazepam (ATIVAN) 0.5 MG tablet Take 1 tablet (0.5 mg total) by mouth every 8 (eight) hours as needed for anxiety.  30 tablet  0  . oxyCODONE-acetaminophen (PERCOCET) 10-325 MG per tablet Take 1 tablet by mouth every 6 (six) hours as needed for pain.  10 tablet  0  . pseudoephedrine (SUDAFED) 30 MG tablet Take 30 mg by mouth every 6 (six) hours as needed for congestion.       . ranitidine (ZANTAC) 300 MG tablet Take 1 tablet by mouth daily.       No current facility-administered medications for this visit.   REVIEW OF SYSTEMS: A 10 point review of systems was conducted and is otherwise negative except for what is noted above.    PHYSICAL EXAMINATION: Blood pressure 134/87, pulse 77, temperature 98.3 F (36.8  C), temperature source Oral, resp. rate 20, height $RemoveBe'5\' 2"'WVydYDvrM$  (1.575 m), weight 190 lb 8 oz (86.41 kg). General: Patient is a well appearing female in no acute distress HEENT: PERRLA, sclerae anicteric no conjunctival pallor, MMM,  Neck: supple, no palpable adenopathy Lungs: clear to auscultation bilaterally, no wheezes, rhonchi, or rales Cardiovascular: regular rate rhythm, S1, S2, no murmurs, rubs or gallops,  Abdomen: Soft, non-tender, non-distended, normoactive bowel sounds, no HSM Extremities: warm and well perfused, no clubbing, cyanosis, or edema Skin: No rashes or lesions Neuro: Non-focal ECOG PERFORMANCE STATUS: 1 - Symptomatic but completely ambulatory Breasts: left breast lumpectomy site healing well, clean dry and intact, no nodularity, right breast: no masses or nodularity  STUDIES/RESULTS: No results found.   LABS:    Chemistry  Component Value Date/Time   NA 142 09/20/2013 1454   NA 139 06/21/2012 1235   K 3.6 09/20/2013 1454   K 4.1 06/21/2012 1235   CL 102 10/18/2012 1353   CL 100 06/21/2012 1235   CO2 27 09/20/2013 1454   CO2 27 06/21/2012 1235   BUN 17.1 09/20/2013 1454   BUN 14 06/21/2012 1235   CREATININE 1.3* 09/20/2013 1454   CREATININE 1.24* 06/21/2012 1235      Component Value Date/Time   CALCIUM 9.7 09/20/2013 1454   CALCIUM 7.4* 06/21/2012 1235   ALKPHOS 114 09/20/2013 1454   ALKPHOS 59 05/10/2012 1909   AST 36* 09/20/2013 1454   AST 30 05/10/2012 1909   ALT 65* 09/20/2013 1454   ALT 69* 05/10/2012 1909   BILITOT 0.33 09/20/2013 1454   BILITOT 3.9* 05/10/2012 1909      Lab Results  Component Value Date   WBC 3.5* 09/20/2013   HGB 12.7 09/20/2013   HCT 37.2 09/20/2013   MCV 92.9 09/20/2013   PLT 122* 09/20/2013   PATHOLOGY:  ADDITIONAL INFORMATION: PROGNOSTIC INDICATORS - ACIS Results IMMUNOHISTOCHEMICAL AND MORPHOMETRIC ANALYSIS BY THE AUTOMATED CELLULAR IMAGING SYSTEM (ACIS) Estrogen Receptor (Negative, <1%): 0%, NEGATIVE Progesterone Receptor (Negative,  <1%): 0%, NEGATIVE Proliferation Marker Ki67 by M IB-1 (Low<20%): 98% All controls stained appropriately Enid Cutter MD Pathologist, Electronic Signature ( Signed 02/15/2012) CHROMOGENIC IN-SITU HYBRIDIZATION Interpretation: HER2/NEU BY CISH - SHOWS AMPLIFICATION BY CISH ANALYSIS. THE RATIO OF HER2: CEP 17 SIGNALS WAS 2.55. 40 TUMOR CELLS WERE SCORED. OF NOTE, THE TUMOR APPEARS TO SHOW HETEROGENEITY IN REGARDS TO HER2 EXPRESSION. Reference range: Ratio: HER2:CEP17 < 1.8 gene amplification not observed Ratio: HER2:CEP 17 1.8-2.2 - equivocal result Ratio: HER2:CEP17 > 2.2 - gene amplification observed Enid Cutter MD Pathologist, Electronic Signature ( Signed 02/15/2012) 1 of ADDITIONAL INFORMATION: PROGNOSTIC INDICATORS - ACIS Results IMMUNOHISTOCHEMICAL AND MORPHOMETRIC ANALYSIS BY THE AUTOMATED CELLULAR IMAGING SYSTEM (ACIS) Estrogen Receptor (Negative, <1%): 0%, NEGATIVE Progesterone Receptor (Negative, <1%): 0%, NEGATIVE COMMENT: The negative hormone receptor study(ies) in this case have an internal positive control. All controls stained appropriately Aldona Bar MD Pathologist, Electronic Signature ( Signed 02/22/2012)  ASSESSMENT #1 Stage 1 HER-2 positive left breast cancer ER negative PR negative.  Patient received for neoadjuvant chemotherapy consisting of Taxotere carboplatinum and herceptin as she does want breast conservation.  She understands the risks and benefits of this approach and the side effects of her chemotherapy and Herceptin. She has completed chemotherapy, surgery, radiation therapy, and  will continue Adjuvant Herceptin to complete 1 year of herceptin therapy.  Herceptin completed on 03/14/2013.  #2  She does have thrombocytopenia and we will monitor this  #3 B12 deficiency  PLAN:  #1  She has now completed all of her adjuvant systemic Herceptin.  #2 patient will continue to get B12 injections on a monthly basis.  #3 she will is seen in 6 months  time in followup.  All questions were answered. The patient knows to call the clinic with any problems, questions or concerns. We can certainly see the patient much sooner if necessary.  I spent 20 minutes counseling the patient face to face. The total time spent in the appointment was 30 minutes.  Carlton Adam, PA-C Medical/Oncology [facility] [phone] (Office)  09/20/2013, 3:45 PM

## 2013-09-20 NOTE — Progress Notes (Signed)
Vitamin B-12 given by desk nurse

## 2013-09-22 NOTE — Patient Instructions (Signed)
Follow up in 6 months Be sure to get your mammogram in October 2015

## 2013-10-17 ENCOUNTER — Ambulatory Visit (HOSPITAL_BASED_OUTPATIENT_CLINIC_OR_DEPARTMENT_OTHER): Payer: Medicare Other

## 2013-10-17 VITALS — BP 120/51 | HR 72 | Temp 98.6°F

## 2013-10-17 DIAGNOSIS — D519 Vitamin B12 deficiency anemia, unspecified: Secondary | ICD-10-CM

## 2013-10-17 DIAGNOSIS — E538 Deficiency of other specified B group vitamins: Secondary | ICD-10-CM

## 2013-10-17 MED ORDER — CYANOCOBALAMIN 1000 MCG/ML IJ SOLN
1000.0000 ug | Freq: Once | INTRAMUSCULAR | Status: AC
  Administered 2013-10-17: 1000 ug via INTRAMUSCULAR

## 2013-10-17 NOTE — Patient Instructions (Signed)

## 2013-11-14 ENCOUNTER — Ambulatory Visit (HOSPITAL_BASED_OUTPATIENT_CLINIC_OR_DEPARTMENT_OTHER): Payer: Medicare Other

## 2013-11-14 VITALS — BP 126/66 | HR 70 | Temp 98.6°F

## 2013-11-14 DIAGNOSIS — E538 Deficiency of other specified B group vitamins: Secondary | ICD-10-CM

## 2013-11-14 DIAGNOSIS — D519 Vitamin B12 deficiency anemia, unspecified: Secondary | ICD-10-CM

## 2013-11-14 MED ORDER — CYANOCOBALAMIN 1000 MCG/ML IJ SOLN
1000.0000 ug | Freq: Once | INTRAMUSCULAR | Status: AC
  Administered 2013-11-14: 1000 ug via INTRAMUSCULAR

## 2013-12-12 ENCOUNTER — Ambulatory Visit (HOSPITAL_BASED_OUTPATIENT_CLINIC_OR_DEPARTMENT_OTHER): Payer: Medicare Other

## 2013-12-12 VITALS — BP 129/42 | HR 67 | Temp 97.9°F

## 2013-12-12 DIAGNOSIS — D519 Vitamin B12 deficiency anemia, unspecified: Secondary | ICD-10-CM

## 2013-12-12 DIAGNOSIS — E538 Deficiency of other specified B group vitamins: Secondary | ICD-10-CM

## 2013-12-12 MED ORDER — CYANOCOBALAMIN 1000 MCG/ML IJ SOLN
1000.0000 ug | Freq: Once | INTRAMUSCULAR | Status: AC
  Administered 2013-12-12: 1000 ug via INTRAMUSCULAR

## 2014-01-09 ENCOUNTER — Ambulatory Visit (HOSPITAL_BASED_OUTPATIENT_CLINIC_OR_DEPARTMENT_OTHER): Payer: Medicare Other

## 2014-01-09 VITALS — BP 133/79 | HR 72 | Temp 98.2°F

## 2014-01-09 DIAGNOSIS — Z23 Encounter for immunization: Secondary | ICD-10-CM

## 2014-01-09 DIAGNOSIS — D519 Vitamin B12 deficiency anemia, unspecified: Secondary | ICD-10-CM

## 2014-01-09 DIAGNOSIS — E538 Deficiency of other specified B group vitamins: Secondary | ICD-10-CM

## 2014-01-09 MED ORDER — CYANOCOBALAMIN 1000 MCG/ML IJ SOLN
1000.0000 ug | Freq: Once | INTRAMUSCULAR | Status: AC
  Administered 2014-01-09: 1000 ug via INTRAMUSCULAR

## 2014-01-09 MED ORDER — INFLUENZA VAC SPLIT QUAD 0.5 ML IM SUSY
0.5000 mL | PREFILLED_SYRINGE | Freq: Once | INTRAMUSCULAR | Status: AC
  Administered 2014-01-09: 0.5 mL via INTRAMUSCULAR
  Filled 2014-01-09: qty 0.5

## 2014-02-06 ENCOUNTER — Ambulatory Visit: Payer: Medicare Other

## 2014-02-26 ENCOUNTER — Encounter: Payer: Self-pay | Admitting: Gynecology

## 2014-03-06 ENCOUNTER — Telehealth: Payer: Self-pay | Admitting: Adult Health

## 2014-03-06 ENCOUNTER — Telehealth: Payer: Self-pay | Admitting: Medical Oncology

## 2014-03-06 ENCOUNTER — Other Ambulatory Visit (HOSPITAL_BASED_OUTPATIENT_CLINIC_OR_DEPARTMENT_OTHER): Payer: Medicare Other

## 2014-03-06 ENCOUNTER — Ambulatory Visit (HOSPITAL_BASED_OUTPATIENT_CLINIC_OR_DEPARTMENT_OTHER): Payer: Medicare Other | Admitting: Adult Health

## 2014-03-06 ENCOUNTER — Ambulatory Visit (HOSPITAL_BASED_OUTPATIENT_CLINIC_OR_DEPARTMENT_OTHER): Payer: Medicare Other

## 2014-03-06 ENCOUNTER — Other Ambulatory Visit: Payer: Self-pay | Admitting: Medical Oncology

## 2014-03-06 ENCOUNTER — Encounter: Payer: Self-pay | Admitting: Adult Health

## 2014-03-06 VITALS — BP 139/62 | HR 75 | Temp 99.1°F | Resp 18 | Ht 62.0 in | Wt 196.8 lb

## 2014-03-06 DIAGNOSIS — C50412 Malignant neoplasm of upper-outer quadrant of left female breast: Secondary | ICD-10-CM

## 2014-03-06 DIAGNOSIS — D696 Thrombocytopenia, unspecified: Secondary | ICD-10-CM

## 2014-03-06 DIAGNOSIS — E538 Deficiency of other specified B group vitamins: Secondary | ICD-10-CM

## 2014-03-06 DIAGNOSIS — C50419 Malignant neoplasm of upper-outer quadrant of unspecified female breast: Secondary | ICD-10-CM

## 2014-03-06 DIAGNOSIS — D519 Vitamin B12 deficiency anemia, unspecified: Secondary | ICD-10-CM

## 2014-03-06 LAB — COMPREHENSIVE METABOLIC PANEL (CC13)
ALT: 59 U/L — AB (ref 0–55)
AST: 42 U/L — AB (ref 5–34)
Albumin: 4.3 g/dL (ref 3.5–5.0)
Alkaline Phosphatase: 82 U/L (ref 40–150)
Anion Gap: 10 mEq/L (ref 3–11)
BILIRUBIN TOTAL: 0.52 mg/dL (ref 0.20–1.20)
BUN: 17.3 mg/dL (ref 7.0–26.0)
CO2: 29 mEq/L (ref 22–29)
CREATININE: 1.3 mg/dL — AB (ref 0.6–1.1)
Calcium: 9.9 mg/dL (ref 8.4–10.4)
Chloride: 101 mEq/L (ref 98–109)
Glucose: 131 mg/dl (ref 70–140)
Potassium: 3.3 mEq/L — ABNORMAL LOW (ref 3.5–5.1)
Sodium: 140 mEq/L (ref 136–145)
Total Protein: 6.9 g/dL (ref 6.4–8.3)

## 2014-03-06 LAB — CBC WITH DIFFERENTIAL/PLATELET
BASO%: 0.3 % (ref 0.0–2.0)
Basophils Absolute: 0 10*3/uL (ref 0.0–0.1)
EOS%: 4.1 % (ref 0.0–7.0)
Eosinophils Absolute: 0.1 10*3/uL (ref 0.0–0.5)
HCT: 34.5 % — ABNORMAL LOW (ref 34.8–46.6)
HGB: 12.3 g/dL (ref 11.6–15.9)
LYMPH%: 36.3 % (ref 14.0–49.7)
MCH: 32.3 pg (ref 25.1–34.0)
MCHC: 35.7 g/dL (ref 31.5–36.0)
MCV: 90.6 fL (ref 79.5–101.0)
MONO#: 0.3 10*3/uL (ref 0.1–0.9)
MONO%: 10.2 % (ref 0.0–14.0)
NEUT#: 1.5 10*3/uL (ref 1.5–6.5)
NEUT%: 49.1 % (ref 38.4–76.8)
PLATELETS: 106 10*3/uL — AB (ref 145–400)
RBC: 3.81 10*6/uL (ref 3.70–5.45)
RDW: 14.1 % (ref 11.2–14.5)
WBC: 3.1 10*3/uL — ABNORMAL LOW (ref 3.9–10.3)
lymph#: 1.1 10*3/uL (ref 0.9–3.3)

## 2014-03-06 LAB — VITAMIN B12: Vitamin B-12: 451 pg/mL (ref 211–911)

## 2014-03-06 MED ORDER — CYANOCOBALAMIN 1000 MCG/ML IJ SOLN
1000.0000 ug | Freq: Once | INTRAMUSCULAR | Status: AC
Start: 2014-03-06 — End: 2014-03-06
  Administered 2014-03-06: 1000 ug via INTRAMUSCULAR

## 2014-03-06 MED ORDER — CYANOCOBALAMIN 1000 MCG/ML IJ SOLN
INTRAMUSCULAR | Status: AC
  Filled 2014-03-06: qty 1

## 2014-03-06 NOTE — Patient Instructions (Signed)
You are doing well.  You have no sign of recurrence.  I recommend healthy diet, exercise, and monthly breast exams.  Please call us if you have any questions or concerns.    We will see you back in 6 months.    Breast Self-Awareness Practicing breast self-awareness may pick up problems early, prevent significant medical complications, and possibly save your life. By practicing breast self-awareness, you can become familiar with how your breasts look and feel and if your breasts are changing. This allows you to notice changes early. It can also offer you some reassurance that your breast health is good. One way to learn what is normal for your breasts and whether your breasts are changing is to do a breast self-exam. If you find a lump or something that was not present in the past, it is best to contact your caregiver right away. Other findings that should be evaluated by your caregiver include nipple discharge, especially if it is bloody; skin changes or reddening; areas where the skin seems to be pulled in (retracted); or new lumps and bumps. Breast pain is seldom associated with cancer (malignancy), but should also be evaluated by a caregiver. HOW TO PERFORM A BREAST SELF-EXAM The best time to examine your breasts is 5-7 days after your menstrual period is over. During menstruation, the breasts are lumpier, and it may be more difficult to pick up changes. If you do not menstruate, have reached menopause, or had your uterus removed (hysterectomy), you should examine your breasts at regular intervals, such as monthly. If you are breastfeeding, examine your breasts after a feeding or after using a breast pump. Breast implants do not decrease the risk for lumps or tumors, so continue to perform breast self-exams as recommended. Talk to your caregiver about how to determine the difference between the implant and breast tissue. Also, talk about the amount of pressure you should use during the exam. Over time,  you will become more familiar with the variations of your breasts and more comfortable with the exam. A breast self-exam requires you to remove all your clothes above the waist. 1. Look at your breasts and nipples. Stand in front of a mirror in a room with good lighting. With your hands on your hips, push your hands firmly downward. Look for a difference in shape, contour, and size from one breast to the other (asymmetry). Asymmetry includes puckers, dips, or bumps. Also, look for skin changes, such as reddened or scaly areas on the breasts. Look for nipple changes, such as discharge, dimpling, repositioning, or redness. 2. Carefully feel your breasts. This is best done either in the shower or tub while using soapy water or when flat on your back. Place the arm (on the side of the breast you are examining) above your head. Use the pads (not the fingertips) of your three middle fingers on your opposite hand to feel your breasts. Start in the underarm area and use  inch (2 cm) overlapping circles to feel your breast. Use 3 different levels of pressure (light, medium, and firm pressure) at each circle before moving to the next circle. The light pressure is needed to feel the tissue closest to the skin. The medium pressure will help to feel breast tissue a little deeper, while the firm pressure is needed to feel the tissue close to the ribs. Continue the overlapping circles, moving downward over the breast until you feel your ribs below your breast. Then, move one finger-width towards the center of  the body. Continue to use the  inch (2 cm) overlapping circles to feel your breast as you move slowly up toward the collar bone (clavicle) near the base of the neck. Continue the up and down exam using all 3 pressures until you reach the middle of the chest. Do this with each breast, carefully feeling for lumps or changes. 3.  Keep a written record with breast changes or normal findings for each breast. By writing this  information down, you do not need to depend only on memory for size, tenderness, or location. Write down where you are in your menstrual cycle, if you are still menstruating. Breast tissue can have some lumps or thick tissue. However, see your caregiver if you find anything that concerns you.  SEEK MEDICAL CARE IF:  You see a change in shape, contour, or size of your breasts or nipples.   You see skin changes, such as reddened or scaly areas on the breasts or nipples.   You have an unusual discharge from your nipples.   You feel a new lump or unusually thick areas.  Document Released: 04/18/2005 Document Revised: 04/04/2012 Document Reviewed: 08/03/2011 [facility] Patient Information 2015 [place], [place]. This information is not intended to replace advice given to you by your health care provider. Make sure you discuss any questions you have with your health care provider.

## 2014-03-06 NOTE — Progress Notes (Signed)
Maria Mckinney [id number] December 25, 1950 63 y.o. 03/06/2014 11:01 AM  CC  [name], MD Dr. Cristela Blue [name] [address] [place] [address] [name] [name] 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  PRIOR THERAPY:  #1Patient was originally seen in the multidisciplinary breast clinic for new diagnosis of left breast cancer that was 61m in the upper outer quadrant of the left breast.  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  There was an additional 4 cm area of normal signal.  Patient did not get an MRI secondary to cochlear implant.  She desires breast conservation.    #2 Patient is s/p port-a-cath placement for neoadjuvant chemotherapy consisting of Taxotere/carboplatinum/Herceptin.  She began cycle #1 on 02/29/2012.  She received Taxotere and carboplatinum every 21 days with Herceptin giving weekly.  She completed a total of 4 cycles of Taxotere and Carboplatinum on 05/03/12.    #3 Herceptin every 3 weeks beginning 05/23/12 - 03/14/13  #4 Lumpectomy by Dr. WDonne Hazelon 06/27/2012 showed complete pathologic response to neoadjuvant chemotherapy.  Sentinel nodes were negative.  #5 Patient underwent radiation therapy from on 08/14/12 - 09/28/12.  She [facility] this [facility]    CURRENT THERAPY: Observation  INTERVAL HISTORY: Patient is seen in followup today of her h/o HER-2/neu positive breast cancer.  She is doing well today.  She did stop meloxicam based on her PCP's recommendation.  She wants to know who her new oncologist will be.  She also wants to know how long she will receive B12 injections.  She is c/o cramping which was more severe when she tried to stop Cymbalta.  She did receive a massage and get in a jacuzzi which helped.  She denies any other new pain.  She  denies any further questions/concerns.  We updated her health maintenance below.       Past Medical History: Past Medical History  Diagnosis Date  . HTN (hypertension)   . Fibromyalgia   . GERD (gastroesophageal reflux disease)   . Arthritis   . Anxiety   . Dysrhythmia     irreg hr  . Wears hearing aid     left ear  . 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 PONV (postoperative nausea and vomiting)   . B12 deficiency anemia 10/18/2012    Past Surgical History: Past Surgical History  Procedure Laterality Date  . Appendectomy    . Combined hysteroscopy diagnostic / d&c    . Cochlear implant    . Portacath placement  02/22/2012    Procedure: INSERTION PORT-A-CATH;  Surgeon: [name];  [facility]  Service: General;  Laterality: Right;  . Breast lumpectomy with needle localization and axillary sentinel lymph node bx Left 06/27/2012    Procedure: BREAST LUMPECTOMY WITH NEEDLE LOCALIZATION AND AXILLARY SENTINEL LYMPH NODE BIOPSY;  Surgeon: [name];  [facility]  Service: General;  Laterality: Left;  [facility] nuclear med 1400  . Port-a-cath removal N/A 04/11/2013    Procedure: MINOR REMOVAL PORT-A-CATH;  Surgeon: [name];  [facility] [facility]  Service: General;  Laterality: N/A;    Family History: Family History  Problem Relation Age of Onset  . Cancer Mother     "  tumor of face removed"  . Cancer Father     kidney removed  . Cancer Maternal Aunt     stomach & Pancreatic  . Cancer Paternal Uncle     pancreatic    Social History History  Substance Use Topics  . Smoking status: Never Smoker   . Smokeless tobacco: Never Used  . Alcohol Use: 0.0 oz/week    0-1 Glasses of wine per week    Allergies: Allergies  Allergen Reactions  . Acyclovir And Related   . Ciprofloxacin Hcl Rash  . Bactrim  [Sulfamethoxazole-Trimethoprim]   . Codeine Nausea Only    Current Medications: Current Outpatient Prescriptions  Medication Sig Dispense Refill  . bisoprolol-hydrochlorothiazide (ZIAC) 5-6.25 MG per tablet Take 1 tablet by mouth daily.    . cyclobenzaprine (FLEXERIL) 10 MG tablet Take 10 mg by mouth daily as needed for muscle spasms. For muscle pain.    . DULoxetine (CYMBALTA) 60 MG capsule Take 1 capsule (60 mg total) by mouth daily. 30 capsule 3  . esomeprazole (NEXIUM) 40 MG capsule Take 22.3 mg by mouth daily before breakfast.    . HYDROcodone-acetaminophen (NORCO/VICODIN) 5-325 MG per tablet Take 1 tablet by mouth every 6 (six) hours as needed for pain.    Marland Kitchen levothyroxine (SYNTHROID, LEVOTHROID) 100 MCG tablet Take 112 mcg by mouth daily.     Marland Kitchen LORazepam (ATIVAN) 0.5 MG tablet Take 1 tablet (0.5 mg total) by mouth every 8 (eight) hours as needed for anxiety. 30 tablet 0  . mometasone (NASONEX) 50 MCG/ACT nasal spray Place 2 sprays into the nose daily.    . potassium chloride (K-DUR) 10 MEQ tablet     . ranitidine (ZANTAC) 300 MG tablet Take 1 tablet by mouth daily.    Marland Kitchen gentamicin (GARAMYCIN) 0.3 % ophthalmic solution     . meloxicam (MOBIC) 15 MG tablet Take 15 mg by mouth daily.    Marland Kitchen oxyCODONE-acetaminophen (PERCOCET) 10-325 MG per tablet Take 1 tablet by mouth every 6 (six) hours as needed for pain. 10 tablet 0  . PROLENSA 0.07 % SOLN     . pseudoephedrine (SUDAFED) 30 MG tablet Take 30 mg by mouth every 6 (six) hours as needed for congestion.      No current facility-administered medications for this visit.   REVIEW OF SYSTEMS: A 10 point review of systems was conducted and is otherwise negative except for what is noted above.   Health Maintenance  Mammogram:02/20/2014 Colonoscopy: 07/2013, 10 year f/u recommended Bone Density Scan:05/2009 normal Pap Smear: cannot recall Eye Exam: 2015 Lipid Panel: 2015   PHYSICAL EXAMINATION: Blood pressure 139/62, pulse 75, temperature  99.1 F (37.3 C), temperature source Oral, resp. rate 18, height 5' 2" (1.575 m), weight 196 lb 12.8 oz (89.268 kg). GENERAL: Patient is a well appearing female in no acute distress HEENT:  Sclerae anicteric.  Oropharynx clear and moist. No ulcerations or evidence of oropharyngeal candidiasis. Neck is supple.  NODES:  No cervical, supraclavicular, or axillary lymphadenopathy palpated.  BREAST EXAM:  Left lumpectomy without nodularity or sign of recurrence.  No nodules or masses, right breast no nodules or masses, benign bilateral breast exam.  LUNGS:  Clear to auscultation bilaterally.  No wheezes or rhonchi. HEART:  Regular rate and rhythm. No murmur appreciated. ABDOMEN:  Soft, nontender.  Positive, normoactive bowel sounds. No organomegaly palpated. MSK:  No focal spinal tenderness to palpation. Full range of motion bilaterally in the upper extremities. EXTREMITIES:  No peripheral edema.   SKIN:  Clear with no obvious rashes or skin changes. No nail dyscrasia. NEURO:  Nonfocal. Well oriented.  Appropriate affect. ECOG PERFORMANCE STATUS: 1 - Symptomatic but completely ambulatory   STUDIES/RESULTS: No results found.   LABS:    Chemistry      Component Value Date/Time   NA 142 09/20/2013 1454   NA 139 06/21/2012 1235   K 3.6 09/20/2013 1454   K 4.1 06/21/2012 1235   CL 102 10/18/2012 1353   CL 100 06/21/2012 1235   CO2 27 09/20/2013 1454   CO2 27 06/21/2012 1235   BUN 17.1 09/20/2013 1454   BUN 14 06/21/2012 1235   CREATININE 1.3* 09/20/2013 1454   CREATININE 1.24* 06/21/2012 1235      Component Value Date/Time   CALCIUM 9.7 09/20/2013 1454   CALCIUM 7.4* 06/21/2012 1235   ALKPHOS 114 09/20/2013 1454   ALKPHOS 59 05/10/2012 1909   AST 36* 09/20/2013 1454   AST 30 05/10/2012 1909   ALT 65* 09/20/2013 1454   ALT 69* 05/10/2012 1909   BILITOT 0.33 09/20/2013 1454   BILITOT 3.9* 05/10/2012 1909      Lab Results  Component Value Date   WBC 3.1* 03/06/2014   HGB 12.3  03/06/2014   HCT 34.5* 03/06/2014   MCV 90.6 03/06/2014   PLT 106* 03/06/2014   PATHOLOGY:  ADDITIONAL INFORMATION: PROGNOSTIC INDICATORS - ACIS Results IMMUNOHISTOCHEMICAL AND MORPHOMETRIC ANALYSIS BY THE AUTOMATED CELLULAR IMAGING SYSTEM (ACIS) Estrogen Receptor (Negative, <1%): 0%, NEGATIVE Progesterone Receptor (Negative, <1%): 0%, NEGATIVE Proliferation Marker Ki67 by M IB-1 (Low<20%): 98% All controls stained appropriately Enid Cutter MD Pathologist, Electronic Signature ( Signed 02/15/2012) CHROMOGENIC IN-SITU HYBRIDIZATION Interpretation: HER2/NEU BY CISH - SHOWS AMPLIFICATION BY CISH ANALYSIS. THE RATIO OF HER2: CEP 17 SIGNALS WAS 2.55. 40 TUMOR CELLS WERE SCORED. OF NOTE, THE TUMOR APPEARS TO SHOW HETEROGENEITY IN REGARDS TO HER2 EXPRESSION. Reference range: Ratio: HER2:CEP17 < 1.8 gene amplification not observed Ratio: HER2:CEP 17 1.8-2.2 - equivocal result Ratio: HER2:CEP17 > 2.2 - gene amplification observed Enid Cutter MD Pathologist, Electronic Signature ( Signed 02/15/2012) 1 of ADDITIONAL INFORMATION: PROGNOSTIC INDICATORS - ACIS Results IMMUNOHISTOCHEMICAL AND MORPHOMETRIC ANALYSIS BY THE AUTOMATED CELLULAR IMAGING SYSTEM (ACIS) Estrogen Receptor (Negative, <1%): 0%, NEGATIVE Progesterone Receptor (Negative, <1%): 0%, NEGATIVE COMMENT: The negative hormone receptor study(ies) in this case have an internal positive control. All controls stained appropriately Aldona Bar MD Pathologist, Electronic Signature ( Signed 02/22/2012)  ASSESSMENT #1 Stage 1 HER-2 positive left breast cancer ER negative PR negative.  Patient received for neoadjuvant chemotherapy consisting of Taxotere carboplatinum and herceptin as she does want breast conservation.  She understands the risks and benefits of this approach and the side effects of her chemotherapy and Herceptin. She has completed chemotherapy, surgery, radiation therapy, and  will continue Adjuvant Herceptin to  complete 1 year of herceptin therapy.  Herceptin completed on 03/14/2013.  #2  She does have thrombocytopenia and we will monitor this  #3 B12 deficiency  PLAN:  Maria Mckinney is doing well today.  She has no sign of recurrence. I recommended healthy diet, exercise, and monthly breast exams. She still has a mild thrombocytopenia that we will continue to monitor.    She will continue to receive her B12 injections, a level is pending.  She is going to talk to her PCP to see if she can get her injections there because it is closer to her home  All questions were answered. The patient knows to call the clinic with any problems, questions  or concerns. We can certainly see the patient much sooner if necessary.  I spent 25 minutes counseling the patient face to face. The total time spent in the appointment was 30 minutes.  Minette Headland, [place] [phone] 03/06/2014, 11:01 AM

## 2014-03-06 NOTE — Telephone Encounter (Signed)
, °

## 2014-03-10 NOTE — Telephone Encounter (Signed)
erroroneous

## 2014-04-02 ENCOUNTER — Telehealth: Payer: Self-pay | Admitting: Hematology

## 2014-04-02 NOTE — Telephone Encounter (Signed)
, °

## 2014-04-03 ENCOUNTER — Ambulatory Visit: Payer: Medicare Other

## 2014-05-01 ENCOUNTER — Ambulatory Visit: Payer: Medicare Other

## 2014-05-29 ENCOUNTER — Ambulatory Visit: Payer: Medicare Other

## 2014-06-06 DIAGNOSIS — E538 Deficiency of other specified B group vitamins: Secondary | ICD-10-CM | POA: Diagnosis not present

## 2014-06-26 ENCOUNTER — Ambulatory Visit: Payer: Medicare Other

## 2014-07-08 DIAGNOSIS — E538 Deficiency of other specified B group vitamins: Secondary | ICD-10-CM | POA: Diagnosis not present

## 2014-07-21 DIAGNOSIS — M545 Low back pain: Secondary | ICD-10-CM | POA: Diagnosis not present

## 2014-07-21 DIAGNOSIS — R300 Dysuria: Secondary | ICD-10-CM | POA: Diagnosis not present

## 2014-07-21 DIAGNOSIS — Z6837 Body mass index (BMI) 37.0-37.9, adult: Secondary | ICD-10-CM | POA: Diagnosis not present

## 2014-07-24 ENCOUNTER — Ambulatory Visit: Payer: Medicare Other

## 2014-08-19 ENCOUNTER — Telehealth: Payer: Self-pay | Admitting: Hematology

## 2014-08-19 NOTE — Telephone Encounter (Signed)
Confirm appointment changed to 05/26. Mailed calendar.

## 2014-08-21 ENCOUNTER — Ambulatory Visit: Payer: Medicare Other

## 2014-09-18 ENCOUNTER — Other Ambulatory Visit: Payer: Medicare Other

## 2014-09-18 ENCOUNTER — Ambulatory Visit: Payer: Medicare Other

## 2014-09-18 ENCOUNTER — Ambulatory Visit: Payer: Medicare Other | Admitting: Hematology

## 2014-09-24 ENCOUNTER — Other Ambulatory Visit: Payer: Self-pay | Admitting: *Deleted

## 2014-09-24 DIAGNOSIS — C50419 Malignant neoplasm of upper-outer quadrant of unspecified female breast: Secondary | ICD-10-CM

## 2014-09-25 ENCOUNTER — Ambulatory Visit: Payer: Medicare Other

## 2014-09-25 ENCOUNTER — Other Ambulatory Visit (HOSPITAL_BASED_OUTPATIENT_CLINIC_OR_DEPARTMENT_OTHER): Payer: Medicare Other

## 2014-09-25 ENCOUNTER — Encounter: Payer: Self-pay | Admitting: Hematology

## 2014-09-25 ENCOUNTER — Ambulatory Visit: Payer: Self-pay

## 2014-09-25 ENCOUNTER — Telehealth: Payer: Self-pay | Admitting: Hematology

## 2014-09-25 ENCOUNTER — Ambulatory Visit (HOSPITAL_BASED_OUTPATIENT_CLINIC_OR_DEPARTMENT_OTHER): Payer: Medicare Other | Admitting: Hematology

## 2014-09-25 VITALS — BP 121/69 | HR 73 | Temp 98.2°F | Resp 17 | Ht 62.0 in | Wt 202.6 lb

## 2014-09-25 DIAGNOSIS — E538 Deficiency of other specified B group vitamins: Secondary | ICD-10-CM

## 2014-09-25 DIAGNOSIS — C50412 Malignant neoplasm of upper-outer quadrant of left female breast: Secondary | ICD-10-CM

## 2014-09-25 DIAGNOSIS — D72819 Decreased white blood cell count, unspecified: Secondary | ICD-10-CM

## 2014-09-25 DIAGNOSIS — D759 Disease of blood and blood-forming organs, unspecified: Secondary | ICD-10-CM | POA: Diagnosis not present

## 2014-09-25 DIAGNOSIS — C50419 Malignant neoplasm of upper-outer quadrant of unspecified female breast: Secondary | ICD-10-CM

## 2014-09-25 DIAGNOSIS — D696 Thrombocytopenia, unspecified: Secondary | ICD-10-CM | POA: Diagnosis not present

## 2014-09-25 LAB — CBC WITH DIFFERENTIAL/PLATELET
BASO%: 0.6 % (ref 0.0–2.0)
BASOS ABS: 0 10*3/uL (ref 0.0–0.1)
EOS ABS: 0.1 10*3/uL (ref 0.0–0.5)
EOS%: 3.4 % (ref 0.0–7.0)
HCT: 36.3 % (ref 34.8–46.6)
HEMOGLOBIN: 12.6 g/dL (ref 11.6–15.9)
LYMPH#: 1 10*3/uL (ref 0.9–3.3)
LYMPH%: 32.3 % (ref 14.0–49.7)
MCH: 32 pg (ref 25.1–34.0)
MCHC: 34.7 g/dL (ref 31.5–36.0)
MCV: 92.1 fL (ref 79.5–101.0)
MONO#: 0.3 10*3/uL (ref 0.1–0.9)
MONO%: 9.7 % (ref 0.0–14.0)
NEUT#: 1.7 10*3/uL (ref 1.5–6.5)
NEUT%: 54 % (ref 38.4–76.8)
PLATELETS: 104 10*3/uL — AB (ref 145–400)
RBC: 3.94 10*6/uL (ref 3.70–5.45)
RDW: 14.1 % (ref 11.2–14.5)
WBC: 3.2 10*3/uL — ABNORMAL LOW (ref 3.9–10.3)

## 2014-09-25 LAB — COMPREHENSIVE METABOLIC PANEL (CC13)
ALK PHOS: 99 U/L (ref 40–150)
ALT: 68 U/L — ABNORMAL HIGH (ref 0–55)
ANION GAP: 11 meq/L (ref 3–11)
AST: 56 U/L — ABNORMAL HIGH (ref 5–34)
Albumin: 4 g/dL (ref 3.5–5.0)
BILIRUBIN TOTAL: 0.37 mg/dL (ref 0.20–1.20)
BUN: 11 mg/dL (ref 7.0–26.0)
CO2: 27 mEq/L (ref 22–29)
Calcium: 9.6 mg/dL (ref 8.4–10.4)
Chloride: 103 mEq/L (ref 98–109)
Creatinine: 1.1 mg/dL (ref 0.6–1.1)
EGFR: 54 mL/min/{1.73_m2} — ABNORMAL LOW (ref >90)
Glucose: 112 mg/dl (ref 70–140)
Potassium: 3.6 mEq/L (ref 3.5–5.1)
Sodium: 142 mEq/L (ref 136–145)
TOTAL PROTEIN: 6.4 g/dL (ref 6.4–8.3)

## 2014-09-25 NOTE — Telephone Encounter (Signed)
per pof to sch pt appt-sch mamma-adv pt [facility] will call to [facility]-gave copy of sch

## 2014-09-25 NOTE — Progress Notes (Signed)
Maria Mckinney [id number] Jun 01, 1950 64 y.o. 09/25/2014 2:08 PM  CC  [name], MD Dr. Cristela Blue [name] [address] [place] [address] [name] [name] 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  PRIOR THERAPY:  #1Patient was originally seen in the multidisciplinary breast clinic for new diagnosis of left breast cancer that was 73m in the upper outer quadrant of the left breast.  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  There was an additional 4 cm area of normal signal.  Patient did not get an MRI secondary to cochlear implant.  She desires breast conservation.    #2 Patient is s/p port-a-cath placement for neoadjuvant chemotherapy consisting of Taxotere/carboplatinum/Herceptin.  She began cycle #1 on 02/29/2012.  She received Taxotere and carboplatinum every 21 days with Herceptin giving weekly.  She completed a total of 4 cycles of Taxotere and Carboplatinum on 05/03/12.    #3 Herceptin every 3 weeks beginning 05/23/12 - 03/14/13  #4 Lumpectomy by Dr. WDonne Hazelon 06/27/2012 showed complete pathologic response to neoadjuvant chemotherapy.  Sentinel nodes were negative.  #5 Patient underwent radiation therapy from on 08/14/12 - 09/28/12.  [facility] [place] [facility] [place] [facility]    CURRENT THERAPY: Observation  INTERVAL HISTORY: Patient is seen in followup today of her h/o HER-2/neu positive breast cancer.  She is doing well today.  She denies any pain, cough, dyspnea, or GI symptoms. She has good appetite and energy level. She is getting B12 injection once a month at her PCP's office.   Past Medical History: Past Medical History  Diagnosis Date  . HTN (hypertension)   . Fibromyalgia   . GERD (gastroesophageal reflux disease)   . Arthritis   . Anxiety   . Dysrhythmia      irreg hr  . Wears hearing aid     left ear  . 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 PONV (postoperative nausea and vomiting)   . B12 deficiency anemia 10/18/2012    Past Surgical History: Past Surgical History  Procedure Laterality Date  . Appendectomy    . Combined hysteroscopy diagnostic / d&c    . Cochlear implant    . Portacath placement  02/22/2012    Procedure: INSERTION PORT-A-CATH;  Surgeon: [name];  [facility] [facility]  Service: General;  Laterality: Right;  . Breast lumpectomy with needle localization and axillary sentinel lymph node bx Left 06/27/2012    Procedure: BREAST LUMPECTOMY WITH NEEDLE LOCALIZATION AND AXILLARY SENTINEL LYMPH NODE BIOPSY;  Surgeon: [name];  [facility]  Service: General;  Laterality: Left;  [facility] ax[facility] sentinel node biopsy nuclear med 1400  . Port-a-cath removal N/A 04/11/2013    Procedure: MINOR REMOVAL PORT-A-CATH;  Surgeon: [name];  [facility]  Service: General;  Laterality: N/A;    Family History: Family History  Problem Relation Age of Onset  . Cancer Mother     "tumor of face removed"  . Cancer Father     kidney removed  . Cancer Maternal Aunt     stomach & Pancreatic  . Cancer Paternal Uncle     pancreatic    Social History History  Substance Use Topics  . Smoking status: Never Smoker   . Smokeless  tobacco: Never Used  . Alcohol Use: 0.0 oz/week    0-1 Glasses of wine per week    Allergies: Allergies  Allergen Reactions  . Acyclovir And Related   . Ciprofloxacin Hcl Rash  . Bactrim [Sulfamethoxazole-Trimethoprim]   . Codeine Nausea Only    Current Medications: Current Outpatient Prescriptions  Medication Sig Dispense Refill  . bisoprolol-hydrochlorothiazide (ZIAC) 5-6.25 MG per tablet Take 1 tablet by mouth daily.    . cholecalciferol (VITAMIN D) 1000 UNITS tablet  Take 5,000 Units by mouth daily.    . cyclobenzaprine (FLEXERIL) 10 MG tablet Take 10 mg by mouth daily as needed for muscle spasms. For muscle pain.    . DULoxetine (CYMBALTA) 60 MG capsule Take 1 capsule (60 mg total) by mouth daily. 30 capsule 3  . esomeprazole (NEXIUM) 40 MG capsule Take 22.3 mg by mouth daily before breakfast.    . HYDROcodone-acetaminophen (NORCO/VICODIN) 5-325 MG per tablet Take 1 tablet by mouth every 6 (six) hours as needed for pain.    Marland Kitchen levothyroxine (SYNTHROID, LEVOTHROID) 112 MCG tablet Take 112 mcg by mouth daily before breakfast.     . Magnesium 100 MG CAPS Take 133 mg by mouth daily.    . mometasone (NASONEX) 50 MCG/ACT nasal spray Place 2 sprays into the nose daily.    . potassium chloride (K-DUR) 10 MEQ tablet     . pregabalin (LYRICA) 150 MG capsule Take 150 mg by mouth daily.    . ranitidine (ZANTAC) 300 MG tablet Take 1 tablet by mouth daily.     No current facility-administered medications for this visit.   REVIEW OF SYSTEMS: A 10 point review of systems was conducted and is otherwise negative except for what is noted above.   Health Maintenance  Mammogram:02/20/2014 Colonoscopy: 07/2013, 10 year f/u recommended Bone Density Scan:05/2009 normal Pap Smear: cannot recall Eye Exam: 2015 Lipid Panel: 2015   PHYSICAL EXAMINATION: Blood pressure 121/69, pulse 73, temperature 98.2 F (36.8 C), temperature source Oral, resp. rate 17, height _0  (1.575 m), weight 202 lb 9.6 oz (91.899 kg), SpO2 97 %. GENERAL: Patient is a well appearing female in no acute distress HEENT:  Sclerae anicteric.  Oropharynx clear and moist. No ulcerations or evidence of oropharyngeal candidiasis. Neck is supple.  NODES:  No cervical, supraclavicular, or axillary lymphadenopathy palpated.  BREAST EXAM:  Left lumpectomy without nodularity or sign of recurrence.  No nodules or masses, right breast no nodules or masses, benign bilateral breast exam.  LUNGS:  Clear to auscultation  bilaterally.  No wheezes or rhonchi. HEART:  Regular rate and rhythm. No murmur appreciated. ABDOMEN:  Soft, nontender.  Positive, normoactive bowel sounds. No organomegaly palpated. MSK:  No focal spinal tenderness to palpation. Full range of motion bilaterally in the upper extremities. EXTREMITIES:  No peripheral edema.   SKIN:  Clear with no obvious rashes or skin changes. No nail dyscrasia. NEURO:  Nonfocal. Well oriented.  Appropriate affect. ECOG PERFORMANCE STATUS: 1 - Symptomatic but completely ambulatory   STUDIES/RESULTS: No results found.   LABS: CBC Latest Ref Rng 09/25/2014 03/06/2014 09/20/2013  WBC 3.9 - 10.3 10e3/uL 3.2(L) 3.1(L) 3.5(L)  Hemoglobin 11.6 - 15.9 g/dL 12.6 12.3 12.7  Hematocrit 34.8 - 46.6 % 36.3 34.5(L) 37.2  Platelets 145 - 400 10e3/uL 104(L) 106(L) 122(L)    CMP Latest Ref Rng 09/25/2014 03/06/2014 09/20/2013  Glucose 70 - 140 mg/dl 112 131 117  BUN 7.0 - 26.0 mg/dL 11.0 17.3 17.1  Creatinine 0.6 - 1.1 mg/dL  1.1 1.3(H) 1.3(H)  Sodium 136 - 145 mEq/L 142 140 142  Potassium 3.5 - 5.1 mEq/L 3.6 3.3(L) 3.6  Chloride 98 - 107 mEq/L - - -  CO2 22 - 29 mEq/L _0 Calcium 8.4 - 10.4 mg/dL 9.6 9.9 9.7  Total Protein 6.4 - 8.3 g/dL 6.4 6.9 6.7  Total Bilirubin 0.20 - 1.20 mg/dL 0.37 0.52 0.33  Alkaline Phos 40 - 150 U/L 99 82 114  AST 5 - 34 U/L 56(H) 42(H) 36(H)  ALT 0 - 55 U/L 68(H) 59(H) 65(H)     PATHOLOGY:  ADDITIONAL INFORMATION: PROGNOSTIC INDICATORS - ACIS Results IMMUNOHISTOCHEMICAL AND MORPHOMETRIC ANALYSIS BY THE AUTOMATED CELLULAR IMAGING SYSTEM (ACIS) Estrogen Receptor (Negative, <1%): 0%, NEGATIVE Progesterone Receptor (Negative, <1%): 0%, NEGATIVE Proliferation Marker Ki67 by M IB-1 (Low<20%): 98% All controls stained appropriately Enid Cutter MD Pathologist, Electronic Signature ( Signed 02/15/2012) CHROMOGENIC IN-SITU HYBRIDIZATION Interpretation: HER2/NEU BY CISH - SHOWS AMPLIFICATION BY CISH ANALYSIS. THE RATIO OF HER2:  CEP 17 SIGNALS WAS 2.55. 40 TUMOR CELLS WERE SCORED. OF NOTE, THE TUMOR APPEARS TO SHOW HETEROGENEITY IN REGARDS TO HER2 EXPRESSION. Reference range: Ratio: HER2:CEP17 < 1.8 gene amplification not observed Ratio: HER2:CEP 17 1.8-2.2 - equivocal result Ratio: HER2:CEP17 > 2.2 - gene amplification observed Enid Cutter MD Pathologist, Electronic Signature ( Signed 02/15/2012) 1 of ADDITIONAL INFORMATION: PROGNOSTIC INDICATORS - ACIS Results IMMUNOHISTOCHEMICAL AND MORPHOMETRIC ANALYSIS BY THE AUTOMATED CELLULAR IMAGING SYSTEM (ACIS) Estrogen Receptor (Negative, <1%): 0%, NEGATIVE Progesterone Receptor (Negative, <1%): 0%, NEGATIVE COMMENT: The negative hormone receptor study(ies) in this case have an internal positive control. All controls stained appropriately Aldona Bar MD Pathologist, Electronic Signature ( Signed 02/22/2012)  ASSESSMENT AND PLAN: 63 year old postmenopausal woman  1. Stage 1 HER-2 positive left breast cancer ER negative PR negative.   -She is clinically doing very well. He has being about 2 years since she finished chemotherapy, surgery and radiation. -Her exam today and her last mammogram in August 2015 showed no evidence of recurrence -We'll continue surveillance with physical exam and annual mammogram -We discussed healthy diet and regular exercise.  2. Mild leukopenia and persistent thrombocytopenia -She had mild leukopenia and some cytopenia even before she starts chemotherapy. Her some cytopenia got worse after chemotherapy, her pre-count has been around 100 since she completed the chemotherapy. No significant anemia. -Given her history of fibromyalgia and hypo-thyroidism, this could be immune related.  -She also has mild elevated liver enzymes, and fatty liver on previous ultrasound in 2007, I'll repeat her abdominal ultrasound if her liver and spleen. We'll also check hepatitis B and C. -If her cytopenia gets worse, she may warrant a bone marrow  biopsy to ruled out a bone marrow disease, such as MDS. Although the chemotherapy she received [facility]) does not typically cause MDS.  2. B12 deficiency -She is getting B-12 injection once months at her primary care physician's office. -I'll check intrinsic factor antibody to ruled out Pernicious anemia. If negative, I suggest she may try po B12.   PLAN: -lab today -US abdomen, I will call her with result -I will see her back in 6 month with lab   I spent 25 minutes counseling the patient face to face. The total time spent in the appointment was 30 minutes.  Truitt Merle   09/25/2014, 2:08 PM

## 2014-09-27 LAB — ANCA SCREEN W REFLEX TITER
ATYPICAL P-ANCA SCREEN: NEGATIVE
c-ANCA Screen: NEGATIVE
p-ANCA Screen: NEGATIVE

## 2014-09-27 LAB — HEPATITIS B SURFACE ANTIBODY,QUALITATIVE: Hep B S Ab: NEGATIVE

## 2014-09-27 LAB — HEPATITIS C ANTIBODY: HCV Ab: NEGATIVE

## 2014-09-27 LAB — RHEUMATOID FACTOR: Rhuematoid fact SerPl-aCnc: 73 IU/mL — ABNORMAL HIGH (ref <=14)

## 2014-09-27 LAB — HEPATITIS B SURFACE ANTIGEN: HEP B S AG: NEGATIVE

## 2014-09-27 LAB — HEPATITIS B CORE ANTIBODY, TOTAL: Hep B Core Total Ab: NONREACTIVE

## 2014-09-27 LAB — INTRINSIC FACTOR ANTIBODIES: Intrinsic Factor: NEGATIVE

## 2014-09-27 LAB — HIV ANTIBODY (ROUTINE TESTING W REFLEX): HIV 1&2 Ab, 4th Generation: NONREACTIVE

## 2014-10-02 ENCOUNTER — Ambulatory Visit (HOSPITAL_COMMUNITY)
Admission: RE | Admit: 2014-10-02 | Discharge: 2014-10-02 | Disposition: A | Discharge status: Home / Self Care | Payer: Medicare Other | Source: Ambulatory Visit | Attending: Hematology | Admitting: Hematology

## 2014-10-02 DIAGNOSIS — D696 Thrombocytopenia, unspecified: Secondary | ICD-10-CM

## 2014-10-02 DIAGNOSIS — D72819 Decreased white blood cell count, unspecified: Secondary | ICD-10-CM | POA: Insufficient documentation

## 2014-10-09 ENCOUNTER — Other Ambulatory Visit: Payer: Self-pay | Admitting: Hematology

## 2014-10-09 ENCOUNTER — Encounter: Payer: Self-pay | Admitting: Hematology

## 2014-10-10 ENCOUNTER — Telehealth: Payer: Self-pay | Admitting: Hematology

## 2014-10-10 NOTE — Telephone Encounter (Signed)
S/w pt confirming MD visit per 06/09 POF, also ask pt if her MM was called in she states no that she was going to call to set up apt.... Mailed schedule to pt.Marland Kitchen KJ

## 2014-10-30 ENCOUNTER — Encounter: Payer: Self-pay | Admitting: Hematology

## 2014-10-30 ENCOUNTER — Ambulatory Visit (HOSPITAL_BASED_OUTPATIENT_CLINIC_OR_DEPARTMENT_OTHER): Payer: Medicare Other | Admitting: Hematology

## 2014-10-30 ENCOUNTER — Telehealth: Payer: Self-pay | Admitting: Hematology

## 2014-10-30 VITALS — BP 155/57 | HR 73 | Resp 16 | Ht 62.0 in | Wt 202.8 lb

## 2014-10-30 DIAGNOSIS — C50412 Malignant neoplasm of upper-outer quadrant of left female breast: Secondary | ICD-10-CM

## 2014-10-30 DIAGNOSIS — Z853 Personal history of malignant neoplasm of breast: Secondary | ICD-10-CM | POA: Diagnosis not present

## 2014-10-30 DIAGNOSIS — E538 Deficiency of other specified B group vitamins: Secondary | ICD-10-CM

## 2014-10-30 DIAGNOSIS — D72819 Decreased white blood cell count, unspecified: Secondary | ICD-10-CM | POA: Diagnosis not present

## 2014-10-30 DIAGNOSIS — D759 Disease of blood and blood-forming organs, unspecified: Secondary | ICD-10-CM | POA: Diagnosis not present

## 2014-10-30 DIAGNOSIS — D61818 Other pancytopenia: Secondary | ICD-10-CM

## 2014-10-30 DIAGNOSIS — D696 Thrombocytopenia, unspecified: Secondary | ICD-10-CM | POA: Diagnosis not present

## 2014-10-30 DIAGNOSIS — Z171 Estrogen receptor negative status [ER-]: Secondary | ICD-10-CM

## 2014-10-30 NOTE — Telephone Encounter (Signed)
Lft msg for referral at Dr. Alphonsa Gin office they will fax over referral form request and I will send upstairs to medical records. I advised pt that someone will call her to schedule the referral and pt has already gotten schedule for next time... KJ

## 2014-10-30 NOTE — Progress Notes (Signed)
Maria Mckinney [id number] 02-May-1951 64 y.o. 10/30/2014 11:08 AM  CC  [name], MD Dr. Daiva Eves [address] [place] [address] [name] [name] 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  PRIOR THERAPY:  #1Patient was originally seen in the multidisciplinary breast clinic for new diagnosis of left breast cancer that was 40mm in the upper outer quadrant of the left breast.  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  There was an additional 4 cm area of normal signal.  Patient did not get an MRI secondary to cochlear implant.  She desires breast conservation.    #2 Patient is s/p port-a-cath placement for neoadjuvant chemotherapy consisting of Taxotere/carboplatinum/Herceptin.  She began cycle #1 on 02/29/2012.  She received Taxotere and carboplatinum every 21 days with Herceptin giving weekly.  She completed a total of 4 cycles of Taxotere and Carboplatinum on 05/03/12.    #3 Herceptin every 3 weeks beginning 05/23/12 - 03/14/13  #4 Lumpectomy by Dr. Donne Hazel on 06/27/2012 showed complete pathologic response to neoadjuvant chemotherapy.  Sentinel nodes were negative.  #5 Patient underwent radiation therapy from on 08/14/12 - 09/28/12.  She received this in [place].    CURRENT THERAPY: Observation  INTERVAL HISTORY:  Maria Mckinney returns for follow-up and to discuss her recent lab and imaging test for her cytopenia. She is doing well overall, no new complaints. She does have chronic arthritis, especially in her hands and feet, which was felt to be osteoarthritis. She saw a rheumatologist was in 20 years ago.  Past Medical History: Past Medical History  Diagnosis Date  . HTN (hypertension)   . Fibromyalgia   . GERD (gastroesophageal reflux disease)   . Arthritis    . Anxiety   . Dysrhythmia     irreg hr  . Wears hearing aid     left ear  . 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 PONV (postoperative nausea and vomiting)   . B12 deficiency anemia 10/18/2012    Past Surgical History: Past Surgical History  Procedure Laterality Date  . Appendectomy    . Combined hysteroscopy diagnostic / d&c    . Cochlear implant    . Portacath placement  02/22/2012    Procedure: INSERTION PORT-A-CATH;  Surgeon: Rolm Bookbinder, MD;  Location: [employer] [facility];  Service: General;  Laterality: Right;  . Breast lumpectomy with needle localization and axillary sentinel lymph node bx Left 06/27/2012    Procedure: BREAST LUMPECTOMY WITH NEEDLE LOCALIZATION AND AXILLARY SENTINEL LYMPH NODE BIOPSY;  Surgeon: Rolm Bookbinder, MD;  Location: [facility];  Service: General;  Laterality: Left;  left breast wire localized at [facility] lumpectomy with left axillary sentinel node biopsy nuclear med 1400  . Port-a-cath removal N/A 04/11/2013    Procedure: MINOR REMOVAL PORT-A-CATH;  Surgeon: Rolm Bookbinder, MD;  Location: [place];  Service: General;  Laterality: N/A;    Family History: Family History  Problem Relation Age of Onset  . Cancer Mother     "tumor of face removed"  . Cancer Father     kidney removed  . Cancer Maternal Aunt     stomach & Pancreatic    Social History History  Substance Use Topics  . Smoking status: Never Smoker   . Smokeless tobacco: Never Used  . Alcohol Use: 0.0  oz/week    0-1 Glasses of wine per week    Allergies: Allergies  Allergen Reactions  . Lyrica [Pregabalin] Swelling    Swelling of eyes & Hands & rash  . Acyclovir And Related   . Ciprofloxacin Hcl Rash  . Bactrim [Sulfamethoxazole-Trimethoprim]   . Codeine Nausea Only    Current Medications: Current Outpatient Prescriptions  Medication Sig Dispense Refill  . bisoprolol-hydrochlorothiazide (ZIAC) 5-6.25 MG per tablet Take 1 tablet by mouth  daily.    . cholecalciferol (VITAMIN D) 1000 UNITS tablet Take 5,000 Units by mouth daily.    . cyclobenzaprine (FLEXERIL) 10 MG tablet Take 10 mg by mouth daily as needed for muscle spasms. For muscle pain.    . DULoxetine (CYMBALTA) 60 MG capsule Take 1 capsule (60 mg total) by mouth daily. 30 capsule 3  . esomeprazole (NEXIUM) 40 MG capsule Take 22.3 mg by mouth daily before breakfast.    . HYDROcodone-acetaminophen (NORCO/VICODIN) 5-325 MG per tablet Take 1 tablet by mouth every 6 (six) hours as needed for pain.    Marland Kitchen levothyroxine (SYNTHROID, LEVOTHROID) 112 MCG tablet Take 112 mcg by mouth daily before breakfast.     . Magnesium 100 MG CAPS Take 133 mg by mouth daily.    . mometasone (NASONEX) 50 MCG/ACT nasal spray Place 2 sprays into the nose daily.    . potassium chloride (K-DUR) 10 MEQ tablet     . ranitidine (ZANTAC) 300 MG tablet Take 1 tablet by mouth daily.    . vitamin B-12 (CYANOCOBALAMIN) 250 MCG tablet Take 250 mcg by mouth daily.    . pregabalin (LYRICA) 150 MG capsule Take 150 mg by mouth daily.     No current facility-administered medications for this visit.   REVIEW OF SYSTEMS: A 10 point review of systems was conducted and is otherwise negative except for what is noted above.   Health Maintenance  Mammogram:02/20/2014 Colonoscopy: 07/2013, 10 year f/u recommended Bone Density Scan:05/2009 normal Pap Smear: cannot recall Eye Exam: 2015 Lipid Panel: 2015   PHYSICAL EXAMINATION: Blood pressure 155/57, pulse 73, resp. rate 16, height $RemoveBe'5\' 2"'ZzfWtirYf$  (1.575 m), weight 202 lb 12.8 oz (91.989 kg), SpO2 98 %. GENERAL: Patient is a well appearing female in no acute distress HEENT:  Sclerae anicteric.  Oropharynx clear and moist. No ulcerations or evidence of oropharyngeal candidiasis. Neck is supple.  NODES:  No cervical, supraclavicular, or axillary lymphadenopathy palpated.  BREAST EXAM:  Left lumpectomy without nodularity or sign of recurrence.  No nodules or masses, right breast  no nodules or masses, benign bilateral breast exam.  LUNGS:  Clear to auscultation bilaterally.  No wheezes or rhonchi. HEART:  Regular rate and rhythm. No murmur appreciated. ABDOMEN:  Soft, nontender.  Positive, normoactive bowel sounds. No organomegaly palpated. MSK:  No focal spinal tenderness to palpation. Full range of motion bilaterally in the upper extremities. EXTREMITIES:  No peripheral edema.   SKIN:  Clear with no obvious rashes or skin changes. No nail dyscrasia. NEURO:  Nonfocal. Well oriented.  Appropriate affect. ECOG PERFORMANCE STATUS: 1 - Symptomatic but completely ambulatory   STUDIES/RESULTS: No results found.   LABS: CBC Latest Ref Rng 09/25/2014 03/06/2014 09/20/2013  WBC 3.9 - 10.3 10e3/uL 3.2(L) 3.1(L) 3.5(L)  Hemoglobin 11.6 - 15.9 g/dL 12.6 12.3 12.7  Hematocrit 34.8 - 46.6 % 36.3 34.5(L) 37.2  Platelets 145 - 400 10e3/uL 104(L) 106(L) 122(L)    CMP Latest Ref Rng 09/25/2014 03/06/2014 09/20/2013  Glucose 70 - 140 mg/dl 112 131 117  BUN 7.0 - 26.0 mg/dL 11.0 17.3 17.1  Creatinine 0.6 - 1.1 mg/dL 1.1 1.3(H) 1.3(H)  Sodium 136 - 145 mEq/L 142 140 142  Potassium 3.5 - 5.1 mEq/L 3.6 3.3(L) 3.6  Chloride 98 - 107 mEq/L - - -  CO2 22 - 29 mEq/L $Remove'27 29 27  'ISwFUjs$ Calcium 8.4 - 10.4 mg/dL 9.6 9.9 9.7  Total Protein 6.4 - 8.3 g/dL 6.4 6.9 6.7  Total Bilirubin 0.20 - 1.20 mg/dL 0.37 0.52 0.33  Alkaline Phos 40 - 150 U/L 99 82 114  AST 5 - 34 U/L 56(H) 42(H) 36(H)  ALT 0 - 55 U/L 68(H) 59(H) 65(H)     PATHOLOGY:  ADDITIONAL INFORMATION: PROGNOSTIC INDICATORS - ACIS Results IMMUNOHISTOCHEMICAL AND MORPHOMETRIC ANALYSIS BY THE AUTOMATED CELLULAR IMAGING SYSTEM (ACIS) Estrogen Receptor (Negative, <1%): 0%, NEGATIVE Progesterone Receptor (Negative, <1%): 0%, NEGATIVE Proliferation Marker Ki67 by M IB-1 (Low<20%): 98% All controls stained appropriately Maria Cutter MD Pathologist, Electronic Signature ( Signed 02/15/2012) CHROMOGENIC IN-SITU  HYBRIDIZATION Interpretation: HER2/NEU BY CISH - SHOWS AMPLIFICATION BY CISH ANALYSIS. THE RATIO OF HER2: CEP 17 SIGNALS WAS 2.55. 40 TUMOR CELLS WERE SCORED. OF NOTE, THE TUMOR APPEARS TO SHOW HETEROGENEITY IN REGARDS TO HER2 EXPRESSION. Reference range: Ratio: HER2:CEP17 < 1.8 gene amplification not observed Ratio: HER2:CEP 17 1.8-2.2 - equivocal result Ratio: HER2:CEP17 > 2.2 - gene amplification observed Maria Cutter MD Pathologist, Electronic Signature ( Signed 02/15/2012) 1 of ADDITIONAL INFORMATION: PROGNOSTIC INDICATORS - ACIS Results IMMUNOHISTOCHEMICAL AND MORPHOMETRIC ANALYSIS BY THE AUTOMATED CELLULAR IMAGING SYSTEM (ACIS) Estrogen Receptor (Negative, <1%): 0%, NEGATIVE Progesterone Receptor (Negative, <1%): 0%, NEGATIVE COMMENT: The negative hormone receptor study(ies) in this case have an internal positive control. All controls stained appropriately Aldona Bar MD Pathologist, Electronic Signature ( Signed 02/22/2012)  ASSESSMENT AND PLAN: 64 year old postmenopausal woman  1. Stage 1 HER-2 positive left breast cancer ER negative PR negative.   -She is clinically doing very well. He has being about 2 years since she finished chemotherapy, surgery and radiation. -Her exam today and her last mammogram in August 2015 showed no evidence of recurrence -We'll continue surveillance with physical exam and annual mammogram -We discussed healthy diet and regular exercise.  2. Mild leukopenia and persistent thrombocytopenia -She had mild leukopenia and some cytopenia even before she starts chemotherapy. Her thrombocytopenia got worse after chemotherapy, her plt has been around 100 since she completed the chemotherapy. No significant anemia. -Given her history of fibromyalgia and hypo-thyroidism, this could be immune related, her lab test reviewed elevated RA titer, she does have some small joints arthritis. I recommend her to follow-up with her primary care physician, and a  possible see rheumatologist. -Her abdominal ultrasound showed possible early or liver cirrhosis, is normal, her cytopenia could be related to her liver cirrhosis also. -I'll hold on bone marrow biopsy at this point, I think the possibility of primary bone marrow disease is low.  3. Transaminitis -She also has mild elevated liver enzymes, and fatty liver on previous ultrasound in 2007, -His hepatitis B, C and HIV were negative -Repeat ultrasound to weeks ago showed possible early liver cirrhosis -I'll refer her to see a chest liver care clinic to see no spell additional [place] for further workup  2. B12 deficiency -She is getting B-12 injection once months at her primary care physician's office. -intrinsic factor antibody was negative, I suggest she may try po B12.   PLAN: -refer to liver clinic  -RTC in Nov for follow up   I spent 25  minutes counseling the patient face to face. The total time spent in the appointment was 30 minutes.  Truitt Merle   10/30/2014, 11:08 AM

## 2014-12-10 ENCOUNTER — Telehealth: Payer: Self-pay | Admitting: *Deleted

## 2014-12-10 NOTE — Telephone Encounter (Signed)
Received faxed note from [facility] informing Dr. Burr Medico re: Pt has been scheduled for a new pt visit at [facility] on  01/21/15 at  1330.

## 2015-03-30 ENCOUNTER — Other Ambulatory Visit: Payer: Self-pay | Admitting: *Deleted

## 2015-03-30 DIAGNOSIS — C50419 Malignant neoplasm of upper-outer quadrant of unspecified female breast: Secondary | ICD-10-CM

## 2015-03-31 ENCOUNTER — Encounter: Payer: Self-pay | Admitting: Hematology

## 2015-03-31 ENCOUNTER — Ambulatory Visit (HOSPITAL_BASED_OUTPATIENT_CLINIC_OR_DEPARTMENT_OTHER): Payer: Medicare Other | Admitting: Hematology

## 2015-03-31 ENCOUNTER — Other Ambulatory Visit (HOSPITAL_BASED_OUTPATIENT_CLINIC_OR_DEPARTMENT_OTHER): Payer: Medicare Other

## 2015-03-31 ENCOUNTER — Telehealth: Payer: Self-pay | Admitting: Hematology

## 2015-03-31 VITALS — BP 127/49 | HR 72 | Temp 98.3°F | Resp 18 | Ht 62.0 in | Wt 194.4 lb

## 2015-03-31 DIAGNOSIS — D696 Thrombocytopenia, unspecified: Secondary | ICD-10-CM

## 2015-03-31 DIAGNOSIS — Z171 Estrogen receptor negative status [ER-]: Secondary | ICD-10-CM

## 2015-03-31 DIAGNOSIS — C50412 Malignant neoplasm of upper-outer quadrant of left female breast: Secondary | ICD-10-CM

## 2015-03-31 DIAGNOSIS — K7460 Unspecified cirrhosis of liver: Secondary | ICD-10-CM

## 2015-03-31 DIAGNOSIS — C50419 Malignant neoplasm of upper-outer quadrant of unspecified female breast: Secondary | ICD-10-CM

## 2015-03-31 DIAGNOSIS — K760 Fatty (change of) liver, not elsewhere classified: Secondary | ICD-10-CM

## 2015-03-31 DIAGNOSIS — D72819 Decreased white blood cell count, unspecified: Secondary | ICD-10-CM

## 2015-03-31 DIAGNOSIS — E538 Deficiency of other specified B group vitamins: Secondary | ICD-10-CM

## 2015-03-31 LAB — COMPREHENSIVE METABOLIC PANEL (CC13)
ALK PHOS: 70 U/L (ref 40–150)
ALT: 59 U/L — AB (ref 0–55)
AST: 48 U/L — ABNORMAL HIGH (ref 5–34)
Albumin: 4.1 g/dL (ref 3.5–5.0)
Anion Gap: 8 mEq/L (ref 3–11)
BILIRUBIN TOTAL: 0.52 mg/dL (ref 0.20–1.20)
BUN: 12.5 mg/dL (ref 7.0–26.0)
CALCIUM: 10.3 mg/dL (ref 8.4–10.4)
CHLORIDE: 104 meq/L (ref 98–109)
CO2: 28 mEq/L (ref 22–29)
Creatinine: 1.3 mg/dL — ABNORMAL HIGH (ref 0.6–1.1)
EGFR: 41 mL/min/{1.73_m2} — ABNORMAL LOW (ref >90)
GLUCOSE: 112 mg/dL (ref 70–140)
Potassium: 4 mEq/L (ref 3.5–5.1)
SODIUM: 140 meq/L (ref 136–145)
Total Protein: 6.7 g/dL (ref 6.4–8.3)

## 2015-03-31 LAB — CBC WITH DIFFERENTIAL/PLATELET
BASO%: 0.9 % (ref 0.0–2.0)
Basophils Absolute: 0 10*3/uL (ref 0.0–0.1)
EOS%: 3.5 % (ref 0.0–7.0)
Eosinophils Absolute: 0.1 10*3/uL (ref 0.0–0.5)
HEMATOCRIT: 39.8 % (ref 34.8–46.6)
HEMOGLOBIN: 13.5 g/dL (ref 11.6–15.9)
LYMPH#: 1.2 10*3/uL (ref 0.9–3.3)
LYMPH%: 37.8 % (ref 14.0–49.7)
MCH: 30.7 pg (ref 25.1–34.0)
MCHC: 33.8 g/dL (ref 31.5–36.0)
MCV: 90.7 fL (ref 79.5–101.0)
MONO#: 0.3 10*3/uL (ref 0.1–0.9)
MONO%: 9.4 % (ref 0.0–14.0)
NEUT#: 1.5 10*3/uL (ref 1.5–6.5)
NEUT%: 48.4 % (ref 38.4–76.8)
Platelets: 115 10*3/uL — ABNORMAL LOW (ref 145–400)
RBC: 4.39 10*6/uL (ref 3.70–5.45)
RDW: 13.5 % (ref 11.2–14.5)
WBC: 3.1 10*3/uL — ABNORMAL LOW (ref 3.9–10.3)

## 2015-03-31 NOTE — Progress Notes (Addendum)
[facility] follow-up note  Maria Mckinney [id number] 04-05-1951 64 y.o. 03/31/2015 3:03 PM  CC  [name], MD [name] [address] [name] [name] 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  PRIOR THERAPY:  #1Patient was originally seen in the multidisciplinary breast clinic for new diagnosis of left breast cancer that was 49m in the upper outer quadrant of the left breast.  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  There was an additional 4 cm area of normal signal.  Patient did not get an MRI secondary to cochlear implant.  She desires breast conservation.    #2 Patient is s/p port-a-cath placement for neoadjuvant chemotherapy consisting of Taxotere/carboplatinum/Herceptin.  She began cycle #1 on 02/29/2012.  She received Taxotere and carboplatinum every 21 days with Herceptin giving weekly.  She completed a total of 4 cycles of Taxotere and Carboplatinum on 05/03/12.    #3 Herceptin every 3 weeks beginning 05/23/12 - 03/14/13  #4 Lumpectomy by Dr. WDonne Hazelon 06/27/2012 showed complete pathologic response to neoadjuvant chemotherapy.  Sentinel nodes were negative.  #5 Patient underwent radiation therapy from on 08/14/12 - 09/28/12.  [place]    CURRENT THERAPY: Observation  INTERVAL HISTORY:  [name] for follow-up. She has been doing well overall since I saw her 6 months ago. I referred her to liver clinic and she saw nurse practitioner [name] Liver workup showed she probably has fatty liver and early cirrhosis. [name] and she would like to transfer her liver care to Dr. BMelina Copaalso. She has been  motivated to be more physically active, she actually got a puppy dog a few weeks ago, and she works with dark every day. She is trying to lose some weight. She denies any pain, dyspnea or other new symptoms.  Past Medical History: Past Medical History  Diagnosis Date  . HTN (hypertension)   . Fibromyalgia   . GERD (gastroesophageal reflux disease)   . Arthritis   . Anxiety   . Dysrhythmia     irreg hr  . Wears hearing aid     left ear  . 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 PONV (postoperative nausea and vomiting)   . B12 deficiency anemia 10/18/2012    Past Surgical History: Past Surgical History  Procedure Laterality Date  . Appendectomy    . Combined hysteroscopy diagnostic / d&c    . Cochlear implant    . Portacath placement  02/22/2012    Procedure: INSERTION PORT-A-CATH;  Surgeon: [name];  [place]  Service: General;  Laterality: Right;  . Breast lumpectomy with needle localization and axillary sentinel lymph node bx Left 06/27/2012    Procedure: BREAST LUMPECTOMY WITH NEEDLE LOCALIZATION AND AXILLARY SENTINEL LYMPH NODE BIOPSY;  Surgeon: [name];  [place]  Service: General;  Laterality: Left;  [facility] sentinel node biopsy nuclear med 1400  . Port-a-cath removal N/A 04/11/2013    Procedure: MINOR REMOVAL PORT-A-CATH;  Surgeon: [name];  [place]  Service: General;  Laterality: N/A;    Family History: Family History  Problem Relation  Age of Onset  . Cancer Mother     "tumor of face removed"  . Cancer Father     kidney removed  . Cancer Maternal Aunt     stomach & Pancreatic    Social History Social History  Substance Use Topics  . Smoking status: Never Smoker   . Smokeless tobacco: Never Used  . Alcohol Use: 0.0 oz/week    0-1 Glasses of wine per week    Allergies: Allergies  Allergen  Reactions  . Lyrica [Pregabalin] Swelling    Swelling of eyes & Hands & rash  . Acyclovir And Related   . Ciprofloxacin Hcl Rash  . Bactrim [Sulfamethoxazole-Trimethoprim]   . Codeine Nausea Only    Current Medications: Current Outpatient Prescriptions  Medication Sig Dispense Refill  . bisoprolol-hydrochlorothiazide (ZIAC) 5-6.25 MG per tablet Take 1 tablet by mouth daily.    . cholecalciferol (VITAMIN D) 1000 UNITS tablet Take 5,000 Units by mouth daily.    . cyclobenzaprine (FLEXERIL) 10 MG tablet Take 10 mg by mouth daily as needed for muscle spasms. For muscle pain.    . DULoxetine (CYMBALTA) 60 MG capsule Take 1 capsule (60 mg total) by mouth daily. 30 capsule 3  . esomeprazole (NEXIUM) 40 MG capsule Take 22.3 mg by mouth daily before breakfast.    . HYDROcodone-acetaminophen (NORCO/VICODIN) 5-325 MG per tablet Take 1 tablet by mouth every 6 (six) hours as needed for pain.    Marland Kitchen levothyroxine (SYNTHROID, LEVOTHROID) 100 MCG tablet     . potassium chloride (K-DUR) 10 MEQ tablet     . ranitidine (ZANTAC) 300 MG tablet Take 1 tablet by mouth daily.    . vitamin B-12 (CYANOCOBALAMIN) 250 MCG tablet Take 250 mcg by mouth daily.     No current facility-administered medications for this visit.   REVIEW OF SYSTEMS: A 10 point review of systems was conducted and is otherwise negative except for what is noted above.   Health Maintenance  Mammogram:02/25/2015 Colonoscopy: 07/2013, 10 year f/u recommended Bone Density Scan:05/2009 normal Pap Smear: cannot recall Eye Exam: 2015 Lipid Panel: 2015   PHYSICAL EXAMINATION: Blood pressure 127/49, pulse 72, temperature 98.3 F (36.8 C), temperature source Oral, resp. rate 18, height _0  (1.575 m), weight 194 lb 6.4 oz (88.179 kg), SpO2 100 %. GENERAL: Patient is a well appearing female in no acute distress HEENT:  Sclerae anicteric.  Oropharynx clear and moist. No ulcerations or evidence of oropharyngeal candidiasis. Neck is supple.   NODES:  No cervical, supraclavicular, or axillary lymphadenopathy palpated.  BREAST EXAM:  Left lumpectomy without nodularity or sign of recurrence.  No nodules or masses, right breast no nodules or masses, benign bilateral breast exam.  LUNGS:  Clear to auscultation bilaterally.  No wheezes or rhonchi. HEART:  Regular rate and rhythm. No murmur appreciated. ABDOMEN:  Soft, nontender.  Positive, normoactive bowel sounds. No organomegaly palpated. MSK:  No focal spinal tenderness to palpation. Full range of motion bilaterally in the upper extremities. EXTREMITIES:  No peripheral edema.   SKIN:  Clear with no obvious rashes or skin changes. No nail dyscrasia. NEURO:  Nonfocal. Well oriented.  Appropriate affect. ECOG PERFORMANCE STATUS: 1 - Symptomatic but completely ambulatory   STUDIES/RESULTS: No results found.   LABS: CBC Latest Ref Rng 03/31/2015 09/25/2014 03/06/2014  WBC 3.9 - 10.3 10e3/uL 3.1(L) 3.2(L) 3.1(L)  Hemoglobin 11.6 - 15.9 g/dL 13.5 12.6 12.3  Hematocrit 34.8 - 46.6 % 39.8 36.3 34.5(L)  Platelets 145 - 400 10e3/uL 115(L) 104(L)  106(L)    CMP Latest Ref Rng 03/31/2015 09/25/2014 03/06/2014  Glucose 70 - 140 mg/dl 112 112 131  BUN 7.0 - 26.0 mg/dL 12.5 11.0 17.3  Creatinine 0.6 - 1.1 mg/dL 1.3(H) 1.1 1.3(H)  Sodium 136 - 145 mEq/L 140 142 140  Potassium 3.5 - 5.1 mEq/L 4.0 3.6 3.3(L)  Chloride 98 - 107 mEq/L - - -  CO2 22 - 29 mEq/L _0 Calcium 8.4 - 10.4 mg/dL 10.3 9.6 9.9  Total Protein 6.4 - 8.3 g/dL 6.7 6.4 6.9  Total Bilirubin 0.20 - 1.20 mg/dL 0.52 0.37 0.52  Alkaline Phos 40 - 150 U/L 70 99 82  AST 5 - 34 U/L 48(H) 56(H) 42(H)  ALT 0 - 55 U/L 59(H) 68(H) 59(H)    ASSESSMENT AND PLAN: 64 year old postmenopausal woman  1. Stage 1 invasive ductal carcinoma of  left breast cancer, ER/PR negative and HER-2 positive.   -She is clinically doing very well. He has being about 2.5 years since she finished chemotherapy, surgery and radiation. -Her exam today  and her last mammogram in August 2016 showed no evidence of recurrence -We'll continue surveillance with physical exam and annual mammogram -I encouraged her to continue healthy diet and regular exercise. She is physically more active lately. She trying to lose weight.   2. Mild leukopenia and persistent thrombocytopenia, secondary to liver cirrhosis -She had mild leukopenia and some cytopenia even before she starts chemotherapy. Her thrombocytopenia got worse after chemotherapy, her plt has been around 100 since she completed the chemotherapy. No significant anemia. -This is likely secondary to her liver cirrhosis, she is being followed by liver clinic  3. Fatty liver and liver cirrhosis -She also has mild elevated liver enzymes, and fatty liver on previous ultrasound in 2007, repeat ultrasound in 2016 showed early liver cirrhosis -His hepatitis B, C and HIV were negative -she would like to transfer her liver care from [facility] to her gastroenterologist Dr. Melina Copa. I'll request her medical records to be faxed over to Dr. Melina Copa.  2. B12 deficiency -She is getting B-12 injection once months at her primary care physician's office. -intrinsic factor antibody was negative, I suggest she may try po B12.   PLAN: -Return to clinic for follow-up and lab in 6 months -I'll fax her medical records to her gastroenterologist Dr. Melina Copa  I spent 25 minutes counseling the patient face to face. The total time spent in the appointment was 30 minutes.  Truitt Merle   03/31/2015, 3:03 PM   Addendum,  Pt later on informed me that had a good visit with Dawn and decided to stay with her for her liver disease follow up.   Truitt Merle  04/06/2015

## 2015-03-31 NOTE — Telephone Encounter (Signed)
per pof to sch pt appt-gave pt copy of avs °

## 2015-04-01 ENCOUNTER — Encounter: Payer: Self-pay | Admitting: Hematology

## 2015-04-01 ENCOUNTER — Other Ambulatory Visit: Payer: Self-pay | Admitting: Nurse Practitioner

## 2015-04-01 DIAGNOSIS — C220 Liver cell carcinoma: Secondary | ICD-10-CM

## 2015-04-13 ENCOUNTER — Ambulatory Visit
Admission: RE | Admit: 2015-04-13 | Discharge: 2015-04-13 | Disposition: A | Discharge status: Home / Self Care | Payer: Medicare Other | Source: Ambulatory Visit | Attending: Nurse Practitioner | Admitting: Nurse Practitioner

## 2015-04-13 ENCOUNTER — Other Ambulatory Visit: Payer: Self-pay | Admitting: Nurse Practitioner

## 2015-04-13 DIAGNOSIS — C220 Liver cell carcinoma: Secondary | ICD-10-CM

## 2015-07-14 ENCOUNTER — Encounter: Payer: Self-pay | Admitting: Gynecology

## 2015-07-14 ENCOUNTER — Ambulatory Visit (INDEPENDENT_AMBULATORY_CARE_PROVIDER_SITE_OTHER): Payer: Medicare Other | Admitting: Gynecology

## 2015-07-14 VITALS — BP 120/74 | Ht 62.0 in | Wt 193.0 lb

## 2015-07-14 DIAGNOSIS — N952 Postmenopausal atrophic vaginitis: Secondary | ICD-10-CM | POA: Diagnosis not present

## 2015-07-14 DIAGNOSIS — C50912 Malignant neoplasm of unspecified site of left female breast: Secondary | ICD-10-CM

## 2015-07-14 DIAGNOSIS — Z01419 Encounter for gynecological examination (general) (routine) without abnormal findings: Secondary | ICD-10-CM

## 2015-07-14 DIAGNOSIS — Z124 Encounter for screening for malignant neoplasm of cervix: Secondary | ICD-10-CM | POA: Diagnosis not present

## 2015-07-14 NOTE — Patient Instructions (Signed)
Followup for bone density as scheduled. 

## 2015-07-14 NOTE — Progress Notes (Signed)
    Maria Mckinney February 18, 1951 [id number]        65 y.o.  G1P0010  for breast and pelvic exam. Has not been in the office for over 3 years. Overall doing well without complaints.  Past medical history,surgical history, problem list, medications, allergies, family history and social history were all reviewed and documented as reviewed in the EPIC chart.  ROS:  Performed with pertinent positives and negatives included in the history, assessment and plan.   Additional significant findings :  none   Exam: Caryn Bee assistant Filed Vitals:   07/14/15 1028  BP: 120/74  Height: 5\' 2"  (1.575 m)  Weight: 193 lb (87.544 kg)   General appearance:  Normal affect, orientation and appearance. Skin: Grossly normal HEENT: Without gross lesions.  No cervical or supraclavicular adenopathy. Thyroid normal.  Lungs:  Clear without wheezing, rales or rhonchi Cardiac: RR, without RMG Abdominal:  Soft, nontender, without masses, guarding, rebound, organomegaly or hernia Breasts:  Examined lying and sitting without masses, retractions, discharge or axillary adenopathy. Pelvic:  Ext/BUS/vagina with atrophic changes. Admits one finger bimanual exam  Cervix atrophic. Pap smear done  Uterus difficult to palpate but no gross masses or tenderness  Adnexa without gross masses or tenderness    Anus and perineum normal   Rectovaginal normal sphincter tone without palpated masses or tenderness.    Assessment/Plan:  65 y.o. G1P0010 female for breast and pelvic exam.   1. Postmenopausal/atrophic genital changes. Fairly significant atrophic changes. Patient asymptomatic without dryness. Is not sexually active. No history of any bleeding. We'll monitor and follow up if any issues develop and/or any bleeding. 2. Mammography 01/2015. History of left sided breast cancer. Exam NED. Continue with annual mammography when due. SBE monthly reviewed. 3. Pap smear 2010. Pap smear done today. No history of abnormal Pap smears  previously. 4. Colonoscopy 2015. Repeat at their recommended interval. 5. DEXA 2011 normal. Recommend repeat DEXA now at 6 year interval.  Patient agrees to schedule. 6. Health maintenance. No routine lab work done as patient does this at her primary physician's office. Follow up 1 year, sooner as needed.   Anastasio Auerbach MD, 11:00 AM 07/14/2015

## 2015-07-14 NOTE — Addendum Note (Signed)
Addended by: Nelva Nay on: 07/14/2015 11:49 AM   Modules accepted: Orders

## 2015-07-16 LAB — PAP IG W/ RFLX HPV ASCU

## 2015-08-18 ENCOUNTER — Other Ambulatory Visit: Payer: Self-pay | Admitting: Gynecology

## 2015-08-18 ENCOUNTER — Ambulatory Visit (INDEPENDENT_AMBULATORY_CARE_PROVIDER_SITE_OTHER): Payer: Medicare Other

## 2015-08-18 ENCOUNTER — Encounter: Payer: Self-pay | Admitting: Gynecology

## 2015-08-18 DIAGNOSIS — M8580 Other specified disorders of bone density and structure, unspecified site: Secondary | ICD-10-CM

## 2015-08-18 DIAGNOSIS — M899 Disorder of bone, unspecified: Secondary | ICD-10-CM

## 2015-08-18 DIAGNOSIS — Z780 Asymptomatic menopausal state: Secondary | ICD-10-CM

## 2015-08-18 DIAGNOSIS — Z01419 Encounter for gynecological examination (general) (routine) without abnormal findings: Secondary | ICD-10-CM

## 2015-08-19 ENCOUNTER — Other Ambulatory Visit: Payer: Self-pay | Admitting: Gynecology

## 2015-08-19 DIAGNOSIS — M8580 Other specified disorders of bone density and structure, unspecified site: Secondary | ICD-10-CM

## 2015-08-26 DIAGNOSIS — H903 Sensorineural hearing loss, bilateral: Secondary | ICD-10-CM | POA: Diagnosis not present

## 2015-09-07 DIAGNOSIS — R7303 Prediabetes: Secondary | ICD-10-CM | POA: Diagnosis not present

## 2015-09-07 DIAGNOSIS — E039 Hypothyroidism, unspecified: Secondary | ICD-10-CM | POA: Diagnosis not present

## 2015-09-07 DIAGNOSIS — E538 Deficiency of other specified B group vitamins: Secondary | ICD-10-CM | POA: Diagnosis not present

## 2015-09-07 DIAGNOSIS — E7800 Pure hypercholesterolemia, unspecified: Secondary | ICD-10-CM | POA: Diagnosis not present

## 2015-09-14 DIAGNOSIS — E7800 Pure hypercholesterolemia, unspecified: Secondary | ICD-10-CM | POA: Diagnosis not present

## 2015-09-14 DIAGNOSIS — Z6835 Body mass index (BMI) 35.0-35.9, adult: Secondary | ICD-10-CM | POA: Diagnosis not present

## 2015-09-14 DIAGNOSIS — Z1389 Encounter for screening for other disorder: Secondary | ICD-10-CM | POA: Diagnosis not present

## 2015-09-14 DIAGNOSIS — I10 Essential (primary) hypertension: Secondary | ICD-10-CM | POA: Diagnosis not present

## 2015-09-14 DIAGNOSIS — E039 Hypothyroidism, unspecified: Secondary | ICD-10-CM | POA: Diagnosis not present

## 2015-09-14 DIAGNOSIS — F418 Other specified anxiety disorders: Secondary | ICD-10-CM | POA: Diagnosis not present

## 2015-09-14 DIAGNOSIS — M797 Fibromyalgia: Secondary | ICD-10-CM | POA: Diagnosis not present

## 2015-09-14 DIAGNOSIS — R7303 Prediabetes: Secondary | ICD-10-CM | POA: Diagnosis not present

## 2015-09-21 DIAGNOSIS — H9193 Unspecified hearing loss, bilateral: Secondary | ICD-10-CM | POA: Diagnosis not present

## 2015-09-21 DIAGNOSIS — H903 Sensorineural hearing loss, bilateral: Secondary | ICD-10-CM | POA: Diagnosis not present

## 2015-09-21 DIAGNOSIS — Z9621 Cochlear implant status: Secondary | ICD-10-CM | POA: Diagnosis not present

## 2015-09-22 DIAGNOSIS — H903 Sensorineural hearing loss, bilateral: Secondary | ICD-10-CM | POA: Diagnosis not present

## 2015-09-29 ENCOUNTER — Other Ambulatory Visit: Payer: Medicare Other

## 2015-09-29 ENCOUNTER — Ambulatory Visit: Payer: Medicare Other | Admitting: Hematology

## 2015-09-30 ENCOUNTER — Other Ambulatory Visit: Payer: Self-pay | Admitting: Nurse Practitioner

## 2015-09-30 DIAGNOSIS — K7581 Nonalcoholic steatohepatitis (NASH): Principal | ICD-10-CM

## 2015-09-30 DIAGNOSIS — K7460 Unspecified cirrhosis of liver: Secondary | ICD-10-CM

## 2015-10-01 ENCOUNTER — Other Ambulatory Visit (HOSPITAL_BASED_OUTPATIENT_CLINIC_OR_DEPARTMENT_OTHER): Payer: Medicare Other

## 2015-10-01 ENCOUNTER — Ambulatory Visit (HOSPITAL_BASED_OUTPATIENT_CLINIC_OR_DEPARTMENT_OTHER): Payer: Medicare Other | Admitting: Hematology

## 2015-10-01 ENCOUNTER — Encounter: Payer: Self-pay | Admitting: Hematology

## 2015-10-01 ENCOUNTER — Telehealth: Payer: Self-pay | Admitting: Hematology

## 2015-10-01 VITALS — BP 154/65 | HR 99 | Temp 98.5°F | Resp 18 | Ht 62.0 in | Wt 194.2 lb

## 2015-10-01 DIAGNOSIS — C50419 Malignant neoplasm of upper-outer quadrant of unspecified female breast: Secondary | ICD-10-CM

## 2015-10-01 DIAGNOSIS — K760 Fatty (change of) liver, not elsewhere classified: Secondary | ICD-10-CM | POA: Diagnosis not present

## 2015-10-01 DIAGNOSIS — C50412 Malignant neoplasm of upper-outer quadrant of left female breast: Secondary | ICD-10-CM

## 2015-10-01 DIAGNOSIS — D72819 Decreased white blood cell count, unspecified: Secondary | ICD-10-CM | POA: Diagnosis not present

## 2015-10-01 DIAGNOSIS — M8580 Other specified disorders of bone density and structure, unspecified site: Secondary | ICD-10-CM

## 2015-10-01 DIAGNOSIS — D61818 Other pancytopenia: Secondary | ICD-10-CM

## 2015-10-01 DIAGNOSIS — D696 Thrombocytopenia, unspecified: Secondary | ICD-10-CM

## 2015-10-01 DIAGNOSIS — K7581 Nonalcoholic steatohepatitis (NASH): Secondary | ICD-10-CM

## 2015-10-01 DIAGNOSIS — E538 Deficiency of other specified B group vitamins: Secondary | ICD-10-CM

## 2015-10-01 DIAGNOSIS — K7460 Unspecified cirrhosis of liver: Secondary | ICD-10-CM | POA: Insufficient documentation

## 2015-10-01 LAB — COMPREHENSIVE METABOLIC PANEL
ALBUMIN: 4.1 g/dL (ref 3.5–5.0)
ALK PHOS: 81 U/L (ref 40–150)
ALT: 50 U/L (ref 0–55)
AST: 54 U/L — AB (ref 5–34)
Anion Gap: 9 mEq/L (ref 3–11)
BILIRUBIN TOTAL: 0.5 mg/dL (ref 0.20–1.20)
BUN: 15.5 mg/dL (ref 7.0–26.0)
CALCIUM: 10.4 mg/dL (ref 8.4–10.4)
CO2: 29 mEq/L (ref 22–29)
CREATININE: 1.4 mg/dL — AB (ref 0.6–1.1)
Chloride: 102 mEq/L (ref 98–109)
EGFR: 39 mL/min/{1.73_m2} — ABNORMAL LOW (ref >90)
GLUCOSE: 104 mg/dL (ref 70–140)
POTASSIUM: 3.9 meq/L (ref 3.5–5.1)
SODIUM: 140 meq/L (ref 136–145)
Total Protein: 7.1 g/dL (ref 6.4–8.3)

## 2015-10-01 LAB — CBC WITH DIFFERENTIAL/PLATELET
BASO%: 0.5 % (ref 0.0–2.0)
Basophils Absolute: 0 10*3/uL (ref 0.0–0.1)
EOS%: 4.2 % (ref 0.0–7.0)
Eosinophils Absolute: 0.2 10*3/uL (ref 0.0–0.5)
HEMATOCRIT: 38.7 % (ref 34.8–46.6)
HEMOGLOBIN: 13.4 g/dL (ref 11.6–15.9)
LYMPH#: 1.6 10*3/uL (ref 0.9–3.3)
LYMPH%: 39.7 % (ref 14.0–49.7)
MCH: 31.6 pg (ref 25.1–34.0)
MCHC: 34.6 g/dL (ref 31.5–36.0)
MCV: 91.3 fL (ref 79.5–101.0)
MONO#: 0.4 10*3/uL (ref 0.1–0.9)
MONO%: 10.1 % (ref 0.0–14.0)
NEUT%: 45.5 % (ref 38.4–76.8)
NEUTROS ABS: 1.9 10*3/uL (ref 1.5–6.5)
Platelets: 113 10*3/uL — ABNORMAL LOW (ref 145–400)
RBC: 4.24 10*6/uL (ref 3.70–5.45)
RDW: 13.2 % (ref 11.2–14.5)
WBC: 4.1 10*3/uL (ref 3.9–10.3)

## 2015-10-01 NOTE — Progress Notes (Signed)
[facility] follow-up note  Maria Mckinney [id number] 07-30-1950 65 y.o. 10/01/2015 7:53 AM  CC  Maria Sake, MD [address] [place] [address] [name] [name] 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  PRIOR THERAPY:  #1Patient was originally seen in the multidisciplinary breast clinic for new diagnosis of left breast cancer that was 55m in the upper outer quadrant of the left breast.  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  There was an additional 4 cm area of normal signal.  Patient did not get an MRI secondary to cochlear implant.  She desires breast conservation.    #2 Patient is s/p port-a-cath placement for neoadjuvant chemotherapy consisting of Taxotere/carboplatinum/Herceptin.  She began cycle #1 on 02/29/2012.  She received Taxotere and carboplatinum every 21 days with Herceptin giving weekly.  She completed a total of 4 cycles of Taxotere and Carboplatinum on 05/03/12.    #3 Herceptin every 3 weeks beginning 05/23/12 - 03/14/13  #4 Lumpectomy by Dr. WDonne Hazelon 06/27/2012 showed complete pathologic response to neoadjuvant chemotherapy.  Sentinel nodes were negative.  #5 Patient underwent radiation therapy from on 08/14/12 - 09/28/12.  [place]    CURRENT THERAPY: Observation  INTERVAL HISTORY:  Maria Mckinney for follow-up. She is doing well overall. She has ben trying to loose some weight, and [facility] [place] [place] she will have a [place] in a few weeks. She is doing well overall, denies any pain, or other symptoms. 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 No other new  complains.   Past Medical History: Past Medical History  Diagnosis Date  . HTN (hypertension)   . Fibromyalgia   . GERD (gastroesophageal reflux disease)   . Arthritis   . Anxiety   . Dysrhythmia     irreg hr  . Wears hearing aid     left ear  . 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 PONV (postoperative nausea and vomiting)   . B12 deficiency anemia 10/18/2012  . [place] ([place]   . Cancer ([facility]     Breast cancer    Past Surgical History: Past Surgical History  Procedure Laterality Date  . Appendectomy    . Combined hysteroscopy diagnostic / d&c    . Cochlear implant    . Portacath placement  02/22/2012    Procedure: INSERTION PORT-A-CATH;  Surgeon: [name];  [place]  Service: General;  Laterality: Right;  . Breast lumpectomy with needle localization and axillary sentinel lymph node bx Left 06/27/2012    Procedure: BREAST LUMPECTOMY WITH NEEDLE LOCALIZATION AND AXILLARY SENTINEL LYMPH NODE BIOPSY;  Surgeon: [name];  [place]: [facility]  Service: General;  Laterality: Left;  [facility] axillary sentinel node biopsy nuclear med 1400  . Port-a-cath removal N/A 04/11/2013    Procedure: MINOR REMOVAL PORT-A-CATH;  Surgeon: [name];  [place]  Service: General;  Laterality: N/A;    Family History: Family History  Problem Relation Age of Onset  . Cancer Mother     "tumor  of face removed"  . Cancer Father     kidney removed  . Cancer Maternal Aunt     stomach & Pancreatic    Social History Social History  Substance Use Topics  . Smoking status: Never Smoker   . Smokeless tobacco: Never Used  . Alcohol Use: 0.0 oz/week    0-1 Glasses of wine per week    Allergies: Allergies  Allergen Reactions  . Lyrica [Pregabalin] Swelling    Swelling of eyes & Hands & rash  . Acyclovir And Related   . Ciprofloxacin Hcl Rash   . Bactrim [Sulfamethoxazole-Trimethoprim]   . Codeine Nausea Only    Current Medications: Current Outpatient Prescriptions  Medication Sig Dispense Refill  . bisoprolol-hydrochlorothiazide (ZIAC) 5-6.25 MG per tablet Take 1 tablet by mouth daily.    . Chelated Magnesium 100 MG TABS Take 250 mg by mouth daily.    . cholecalciferol (VITAMIN D) 1000 UNITS tablet Take 5,000 Units by mouth daily.    . cyclobenzaprine (FLEXERIL) 10 MG tablet Take 10 mg by mouth daily as needed for muscle spasms. For muscle pain.    . DULoxetine (CYMBALTA) 60 MG capsule Take 1 capsule (60 mg total) by mouth daily. 30 capsule 3  . esomeprazole (NEXIUM) 40 MG capsule Take 22.3 mg by mouth daily before breakfast.    . HYDROcodone-acetaminophen (NORCO/VICODIN) 5-325 MG per tablet Take 1 tablet by mouth every 6 (six) hours as needed for pain.    Marland Kitchen levothyroxine (SYNTHROID, LEVOTHROID) 100 MCG tablet     . potassium chloride (K-DUR) 10 MEQ tablet Take 20 mEq by mouth daily.     . ranitidine (ZANTAC) 300 MG tablet Take 1 tablet by mouth daily.    . vitamin B-12 (CYANOCOBALAMIN) 250 MCG tablet Take 500 mcg by mouth daily.      No current facility-administered medications for this visit.   REVIEW OF SYSTEMS: A 10 point review of systems was conducted and is otherwise negative except for what is noted above.    Health Maintenance  Mammogram:02/25/2015 Colonoscopy: 07/2013, 10 year f/u recommended Bone Density Scan: 08/18/2015 osteopenia in left hip (T score -1.2)  Pap Smear: cannot recall Eye Exam: 2015 Lipid Panel: 2016   PHYSICAL EXAMINATION: Blood pressure 154/65, pulse 99, temperature 98.5 F (36.9 C), temperature source Oral, resp. rate 18, height '5\' 2"'$  (1.575 m), weight 194 lb 3.2 oz (88.089 kg), SpO2 100 %. GENERAL: Patient is a well appearing female in no acute distress HEENT:  Sclerae anicteric.  Oropharynx clear and moist. No ulcerations or evidence of oropharyngeal candidiasis. Neck is supple.   NODES:  No cervical, supraclavicular, or axillary lymphadenopathy palpated.  BREAST EXAM:  Left lumpectomy without nodularity or sign of recurrence.  No nodules or masses, right breast no nodules or masses, benign bilateral breast exam.  LUNGS:  Clear to auscultation bilaterally.  No wheezes or rhonchi. HEART:  Regular rate and rhythm. No murmur appreciated. ABDOMEN:  Soft, nontender.  Positive, normoactive bowel sounds. No organomegaly palpated. MSK:  No focal spinal tenderness to palpation. Full range of motion bilaterally in the upper extremities. EXTREMITIES:  No peripheral edema.   SKIN:  Clear with no obvious rashes or skin changes. No nail dyscrasia. NEURO:  Nonfocal. Well oriented.  Appropriate affect. ECOG PERFORMANCE STATUS: 1 - Symptomatic but completely ambulatory   STUDIES/RESULTS: No results found.   LABS: CBC Latest Ref Rng 10/01/2015 03/31/2015 09/25/2014  WBC 3.9 - 10.3 10e3/uL 4.1 3.1(L) 3.2(L)  Hemoglobin 11.6 - 15.9 g/dL 13.4  13.5 12.6  Hematocrit 34.8 - 46.6 % 38.7 39.8 36.3  Platelets 145 - 400 10e3/uL 113(L) 115(L) 104(L)    CMP Latest Ref Rng 10/01/2015 03/31/2015 09/25/2014  Glucose 70 - 140 mg/dl 104 112 112  BUN 7.0 - 26.0 mg/dL 15.5 12.5 11.0  Creatinine 0.6 - 1.1 mg/dL 1.4(H) 1.3(H) 1.1  Sodium 136 - 145 mEq/L 140 140 142  Potassium 3.5 - 5.1 mEq/L 3.9 4.0 3.6  CO2 22 - 29 mEq/L '29 28 27  '$ Calcium 8.4 - 10.4 mg/dL 10.4 10.3 9.6  Total Protein 6.4 - 8.3 g/dL 7.1 6.7 6.4  Total Bilirubin 0.20 - 1.20 mg/dL 0.50 0.52 0.37  Alkaline Phos 40 - 150 U/L 81 70 99  AST 5 - 34 U/L 54(H) 48(H) 56(H)  ALT 0 - 55 U/L 50 59(H) 68(H)    ASSESSMENT AND PLAN: 65 year old postmenopausal woman  1. Stage 1 invasive ductal carcinoma of  left breast cancer, ER/PR negative and HER-2 positive.   -She is clinically doing very well. He has being about 3.5 years since her initial diagnosis. -Her exam today and her last mammogram in August 2016 showed no evidence of  recurrence -We'll continue surveillance with physical exam and annual mammogram -I encouraged her to continue healthy diet and regular exercise. She is physically more active lately. She trying to lose weight.   2. Osteopenia -her DEXA scan in 08/2015 showed mild osteopenia in left hip, I encourage her to take calcium, in addition to vitD   3. Mild leukopenia and persistent thrombocytopenia, secondary to liver cirrhosis -She had mild leukopenia and some cytopenia even before she starts chemotherapy. Her thrombocytopenia got worse after chemotherapy, her plt has been around 100 since she completed the chemotherapy. No significant anemia. -This is likely secondary to her liver cirrhosis, she is being followed by liver clinic -her blood counts are stable overall  4. Fatty liver and early liver cirrhosis -She also has mild elevated liver enzymes, and fatty liver on previous ultrasound in 2007, repeat ultrasound in 2016 showed early liver cirrhosis -His hepatitis B, C and HIV were negative -she will continue following up with Dawn at the liver clinic   5. B12 deficiency -She was getting B-12 injection once months at her primary care physician's office, and recently changed to oral B12,  intrinsic factor antibody was negative -will follow her B12 level    PLAN: -Return to clinic for follow-up and lab in 6 months -mammogram in 03/2016   I spent 25 minutes counseling the patient face to face. The total time spent in the appointment was 30 minutes.  Truitt Merle   10/01/2015,

## 2015-10-01 NOTE — Telephone Encounter (Signed)
Gave pt apt & avs, pt says [facility] will contact her to sched mammo

## 2015-10-06 ENCOUNTER — Ambulatory Visit
Admission: RE | Admit: 2015-10-06 | Discharge: 2015-10-06 | Disposition: A | Discharge status: Home / Self Care | Payer: Medicare Other | Source: Ambulatory Visit | Attending: Nurse Practitioner | Admitting: Nurse Practitioner

## 2015-10-06 DIAGNOSIS — K7460 Unspecified cirrhosis of liver: Secondary | ICD-10-CM | POA: Diagnosis not present

## 2015-10-06 DIAGNOSIS — K7581 Nonalcoholic steatohepatitis (NASH): Principal | ICD-10-CM

## 2015-10-15 DIAGNOSIS — L820 Inflamed seborrheic keratosis: Secondary | ICD-10-CM | POA: Diagnosis not present

## 2015-10-30 DIAGNOSIS — D638 Anemia in other chronic diseases classified elsewhere: Secondary | ICD-10-CM | POA: Diagnosis not present

## 2015-10-30 DIAGNOSIS — C50419 Malignant neoplasm of upper-outer quadrant of unspecified female breast: Secondary | ICD-10-CM | POA: Diagnosis not present

## 2015-10-30 DIAGNOSIS — Z9621 Cochlear implant status: Secondary | ICD-10-CM | POA: Diagnosis not present

## 2015-10-30 DIAGNOSIS — G4733 Obstructive sleep apnea (adult) (pediatric): Secondary | ICD-10-CM | POA: Diagnosis not present

## 2015-10-30 DIAGNOSIS — K7581 Nonalcoholic steatohepatitis (NASH): Secondary | ICD-10-CM | POA: Diagnosis not present

## 2015-10-30 DIAGNOSIS — H9193 Unspecified hearing loss, bilateral: Secondary | ICD-10-CM | POA: Diagnosis not present

## 2015-10-30 DIAGNOSIS — Z9989 Dependence on other enabling machines and devices: Secondary | ICD-10-CM | POA: Diagnosis not present

## 2015-10-30 DIAGNOSIS — H903 Sensorineural hearing loss, bilateral: Secondary | ICD-10-CM | POA: Diagnosis not present

## 2015-10-30 DIAGNOSIS — I10 Essential (primary) hypertension: Secondary | ICD-10-CM | POA: Diagnosis not present

## 2015-10-31 HISTORY — PX: COCHLEAR IMPLANT: SUR684

## 2015-11-06 DIAGNOSIS — N189 Chronic kidney disease, unspecified: Secondary | ICD-10-CM | POA: Diagnosis not present

## 2015-11-06 DIAGNOSIS — H903 Sensorineural hearing loss, bilateral: Secondary | ICD-10-CM | POA: Diagnosis not present

## 2015-11-06 DIAGNOSIS — Z9621 Cochlear implant status: Secondary | ICD-10-CM | POA: Diagnosis not present

## 2015-11-06 DIAGNOSIS — I129 Hypertensive chronic kidney disease with stage 1 through stage 4 chronic kidney disease, or unspecified chronic kidney disease: Secondary | ICD-10-CM | POA: Diagnosis not present

## 2015-11-06 DIAGNOSIS — Z7982 Long term (current) use of aspirin: Secondary | ICD-10-CM | POA: Diagnosis not present

## 2015-11-06 DIAGNOSIS — K7460 Unspecified cirrhosis of liver: Secondary | ICD-10-CM | POA: Diagnosis not present

## 2015-11-06 DIAGNOSIS — M797 Fibromyalgia: Secondary | ICD-10-CM | POA: Diagnosis not present

## 2015-11-06 DIAGNOSIS — K219 Gastro-esophageal reflux disease without esophagitis: Secondary | ICD-10-CM | POA: Diagnosis not present

## 2015-11-06 DIAGNOSIS — D649 Anemia, unspecified: Secondary | ICD-10-CM | POA: Diagnosis not present

## 2015-11-06 DIAGNOSIS — G629 Polyneuropathy, unspecified: Secondary | ICD-10-CM | POA: Diagnosis not present

## 2015-11-06 DIAGNOSIS — G4733 Obstructive sleep apnea (adult) (pediatric): Secondary | ICD-10-CM | POA: Diagnosis not present

## 2015-12-01 DIAGNOSIS — Z48810 Encounter for surgical aftercare following surgery on the sense organs: Secondary | ICD-10-CM | POA: Diagnosis not present

## 2015-12-01 DIAGNOSIS — Z9621 Cochlear implant status: Secondary | ICD-10-CM | POA: Diagnosis not present

## 2015-12-01 DIAGNOSIS — H903 Sensorineural hearing loss, bilateral: Secondary | ICD-10-CM | POA: Diagnosis not present

## 2015-12-01 DIAGNOSIS — Z9889 Other specified postprocedural states: Secondary | ICD-10-CM | POA: Diagnosis not present

## 2016-01-12 DIAGNOSIS — Z9621 Cochlear implant status: Secondary | ICD-10-CM | POA: Diagnosis not present

## 2016-01-12 DIAGNOSIS — H903 Sensorineural hearing loss, bilateral: Secondary | ICD-10-CM | POA: Diagnosis not present

## 2016-01-12 DIAGNOSIS — Z09 Encounter for follow-up examination after completed treatment for conditions other than malignant neoplasm: Secondary | ICD-10-CM | POA: Diagnosis not present

## 2016-01-13 DIAGNOSIS — H26493 Other secondary cataract, bilateral: Secondary | ICD-10-CM | POA: Diagnosis not present

## 2016-01-13 DIAGNOSIS — H04123 Dry eye syndrome of bilateral lacrimal glands: Secondary | ICD-10-CM | POA: Diagnosis not present

## 2016-01-20 DIAGNOSIS — H903 Sensorineural hearing loss, bilateral: Secondary | ICD-10-CM | POA: Diagnosis not present

## 2016-03-02 DIAGNOSIS — Z853 Personal history of malignant neoplasm of breast: Secondary | ICD-10-CM | POA: Diagnosis not present

## 2016-03-02 DIAGNOSIS — R928 Other abnormal and inconclusive findings on diagnostic imaging of breast: Secondary | ICD-10-CM | POA: Diagnosis not present

## 2016-03-15 DIAGNOSIS — H9193 Unspecified hearing loss, bilateral: Secondary | ICD-10-CM | POA: Diagnosis not present

## 2016-03-15 DIAGNOSIS — H903 Sensorineural hearing loss, bilateral: Secondary | ICD-10-CM | POA: Diagnosis not present

## 2016-03-16 DIAGNOSIS — E039 Hypothyroidism, unspecified: Secondary | ICD-10-CM | POA: Diagnosis not present

## 2016-03-16 DIAGNOSIS — E7800 Pure hypercholesterolemia, unspecified: Secondary | ICD-10-CM | POA: Diagnosis not present

## 2016-03-16 DIAGNOSIS — I10 Essential (primary) hypertension: Secondary | ICD-10-CM | POA: Diagnosis not present

## 2016-03-16 DIAGNOSIS — E538 Deficiency of other specified B group vitamins: Secondary | ICD-10-CM | POA: Diagnosis not present

## 2016-03-18 DIAGNOSIS — K219 Gastro-esophageal reflux disease without esophagitis: Secondary | ICD-10-CM | POA: Diagnosis not present

## 2016-03-18 DIAGNOSIS — K7460 Unspecified cirrhosis of liver: Secondary | ICD-10-CM | POA: Diagnosis not present

## 2016-03-18 DIAGNOSIS — Z23 Encounter for immunization: Secondary | ICD-10-CM | POA: Diagnosis not present

## 2016-03-18 DIAGNOSIS — Z9181 History of falling: Secondary | ICD-10-CM | POA: Diagnosis not present

## 2016-03-18 DIAGNOSIS — F418 Other specified anxiety disorders: Secondary | ICD-10-CM | POA: Diagnosis not present

## 2016-03-18 DIAGNOSIS — E7800 Pure hypercholesterolemia, unspecified: Secondary | ICD-10-CM | POA: Diagnosis not present

## 2016-03-18 DIAGNOSIS — I10 Essential (primary) hypertension: Secondary | ICD-10-CM | POA: Diagnosis not present

## 2016-03-18 DIAGNOSIS — M797 Fibromyalgia: Secondary | ICD-10-CM | POA: Diagnosis not present

## 2016-03-18 DIAGNOSIS — M1990 Unspecified osteoarthritis, unspecified site: Secondary | ICD-10-CM | POA: Diagnosis not present

## 2016-03-18 DIAGNOSIS — E039 Hypothyroidism, unspecified: Secondary | ICD-10-CM | POA: Diagnosis not present

## 2016-03-18 DIAGNOSIS — Z6835 Body mass index (BMI) 35.0-35.9, adult: Secondary | ICD-10-CM | POA: Diagnosis not present

## 2016-03-18 DIAGNOSIS — E669 Obesity, unspecified: Secondary | ICD-10-CM | POA: Diagnosis not present

## 2016-03-29 ENCOUNTER — Other Ambulatory Visit: Payer: Self-pay | Admitting: Nurse Practitioner

## 2016-03-29 DIAGNOSIS — K7469 Other cirrhosis of liver: Secondary | ICD-10-CM

## 2016-03-31 NOTE — Progress Notes (Signed)
[facility] follow-up note  Maria Mckinney [id number] Feb 28, 1951 65 y.o. 04/01/2016 3:37 PM  CC  Maria Sake, MD [address] [place] [address] [name] [name] 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  PRIOR THERAPY:  #1Patient was originally seen in the multidisciplinary breast clinic for new diagnosis of left breast cancer that was 91m in the upper outer quadrant of the left breast.  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  There was an additional 4 cm area of normal signal.  Patient did not get an MRI secondary to cochlear implant.  She desires breast conservation.    #2 Patient is s/p port-a-cath placement for neoadjuvant chemotherapy consisting of Taxotere/carboplatinum/Herceptin.  She began cycle #1 on 02/29/2012.  She received Taxotere and carboplatinum every 21 days with Herceptin giving weekly.  She completed a total of 4 cycles of Taxotere and Carboplatinum on 05/03/12.    #3 Herceptin every 3 weeks beginning 05/23/12 - 03/14/13  #4 Lumpectomy by Dr. WDonne Hazelon 06/27/2012 showed complete pathologic response to neoadjuvant chemotherapy.  Sentinel nodes were negative.  #5 Patient underwent radiation therapy from on 08/14/12 - 09/28/12.  [place]    CURRENT THERAPY: Observation  INTERVAL HISTORY:  BCarlynereturns for follow-up. She is doing well overall. She denies any pain. She hasn't had any medical issues in the last 6 months. [facility] She was told [facility] was [facility].   Past Medical History: Past Medical History:  Diagnosis Date  . Anxiety   . Arthritis   . B12 deficiency anemia 10/18/2012  . Cancer ([facility]    Breast cancer  . [place] of [place] ([place]    . 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 Dysrhythmia    irreg hr  . Fibromyalgia   . GERD (gastroesophageal reflux disease)   . HTN (hypertension)   . PONV (postoperative nausea and vomiting)   . Wears hearing aid    left ear    Past Surgical History: Past Surgical History:  Procedure Laterality Date  . APPENDECTOMY    . BREAST LUMPECTOMY WITH NEEDLE LOCALIZATION AND AXILLARY SENTINEL LYMPH NODE BX Left 06/27/2012   Procedure: BREAST LUMPECTOMY WITH NEEDLE LOCALIZATION AND AXILLARY SENTINEL LYMPH NODE BIOPSY;  Surgeon: [name];  [place]  Service: General;  Laterality: Left;  [facility] axillary sentinel node biopsy nuclear med 1400  . COCHLEAR IMPLANT    . COMBINED HYSTEROSCOPY DIAGNOSTIC / D&C    . PORT-A-CATH REMOVAL N/A 04/11/2013   Procedure: MINOR REMOVAL PORT-A-CATH;  Surgeon: [name];  [place]  Service: General;  Laterality: N/A;  . PORTACATH PLACEMENT  02/22/2012   Procedure: INSERTION PORT-A-CATH;  Surgeon: [name];  [place]  Service: General;  Laterality: Right;    Family History: Family History  Problem Relation Age of Onset  . Cancer Mother     "tumor of face removed"  . Cancer Father     kidney removed  . Cancer Maternal Aunt     stomach & Pancreatic    Social History Social History  Substance Use Topics  . Smoking status: Never Smoker  . Smokeless tobacco:  Never Used  . Alcohol use 0.0 oz/week    Allergies: Allergies  Allergen Reactions  . Lyrica [Pregabalin] Swelling    Swelling of eyes & Hands & rash  . Acyclovir And Related   . Ciprofloxacin Hcl Rash  . Bactrim [Sulfamethoxazole-Trimethoprim]   . Codeine Nausea Only    Current Medications: Current Outpatient Prescriptions  Medication Sig Dispense Refill  . aspirin 81 MG tablet Take 81 mg by mouth daily.    .  bisoprolol-hydrochlorothiazide (ZIAC) 5-6.25 MG per tablet Take 1 tablet by mouth daily.    . Chelated Magnesium 100 MG TABS Take 250 mg by mouth daily.    . cholecalciferol (VITAMIN D) 1000 UNITS tablet Take 5,000 Units by mouth daily.    . cyclobenzaprine (FLEXERIL) 10 MG tablet Take 10 mg by mouth daily as needed for muscle spasms. For muscle pain.    . DULoxetine (CYMBALTA) 60 MG capsule Take 1 capsule (60 mg total) by mouth daily. 30 capsule 3  . esomeprazole (NEXIUM) 40 MG capsule Take 22.3 mg by mouth daily before breakfast.    . HYDROcodone-acetaminophen (NORCO/VICODIN) 5-325 MG per tablet Take 1 tablet by mouth every 6 (six) hours as needed for pain.    Marland Kitchen levothyroxine (SYNTHROID, LEVOTHROID) 100 MCG tablet     . potassium chloride (K-DUR) 10 MEQ tablet Take 20 mEq by mouth daily.     . ranitidine (ZANTAC) 300 MG tablet Take 1 tablet by mouth daily.    . vitamin B-12 (CYANOCOBALAMIN) 250 MCG tablet Take 5,000 mcg by mouth daily.      No current facility-administered medications for this visit.    REVIEW OF SYSTEMS: GENERAL:alert, no distress and comfortable SKIN: skin color, texture, turgor are normal, no rashes or significant lesions  EYES: normal, conjunctiva are pink and non-injected, sclera clear OROPHARYNX:no exudate, no erythema and lips, buccal mucosa, and tongue normal  NECK: supple, thyroid normal size, non-tender, without nodularity LYMPH:  no palpable lymphadenopathy in the cervical, axillary or inguinal LUNGS: clear to auscultation and percussion with normal breathing effort HEART: regular rate & rhythm and no murmurs and no lower extremity edema ABDOMEN:abdomen soft, non-tender and normal bowel sounds Musculoskeletal:no cyanosis of digits and no clubbing  PSYCH: alert & oriented x 3 with fluent speech NEURO: no focal motor/sensory deficits A 10 point review of systems was conducted and is otherwise negative except for what is noted above.    Health  Maintenance  Mammogram:03/2016 Colonoscopy: 07/2013, 10 year f/u recommended Bone Density Scan: 08/18/2015 osteopenia in left hip (T score -1.2)  Pap Smear: cannot recall Eye Exam: 2015 Lipid Panel: 2016   PHYSICAL EXAMINATION: Blood pressure 130/70, pulse 70, temperature 98.7 F (37.1 C), temperature source Oral, resp. rate 18, height 5\' 2"  (1.575 m), weight 191 lb (86.6 kg), SpO2 99 %. GENERAL: Patient is a well appearing female in no acute distress HEENT:  Sclerae anicteric.  Oropharynx clear and moist. No ulcerations or evidence of oropharyngeal candidiasis. Neck is supple.  NODES:  No cervical, supraclavicular, or axillary lymphadenopathy palpated.  BREAST EXAM:  Left lumpectomy without nodularity or sign of recurrence.  No nodules or masses, right breast no nodules or masses, benign bilateral breast exam. (+) There is scar tissue present in the right upper outer quadrant and a scar underneath the incision site.  LUNGS:  Clear to auscultation bilaterally.  No wheezes or rhonchi. HEART:  Regular rate and rhythm. No murmur appreciated. ABDOMEN:  Soft, nontender.  Positive, normoactive bowel sounds. No organomegaly palpated.  MSK:  No focal spinal tenderness to palpation. Full range of motion bilaterally in the upper extremities. EXTREMITIES:  No peripheral edema.   SKIN:  Clear with no obvious rashes or skin changes. No nail dyscrasia. NEURO:  Nonfocal. Well oriented.  Appropriate affect.  ECOG PERFORMANCE STATUS: 1 - Symptomatic but completely ambulatory   STUDIES/RESULTS: No results found.   LABS: CBC Latest Ref Rng & Units 04/01/2016 10/01/2015 03/31/2015  WBC 3.9 - 10.3 10e3/uL 3.5(L) 4.1 3.1(L)  Hemoglobin 11.6 - 15.9 g/dL 12.9 13.4 13.5  Hematocrit 34.8 - 46.6 % 37.3 38.7 39.8  Platelets 145 - 400 10e3/uL 106(L) 113(L) 115(L)    CMP Latest Ref Rng & Units 04/01/2016 10/01/2015 03/31/2015  Glucose 70 - 140 mg/dl 109 104 112  BUN 7.0 - 26.0 mg/dL 14.2 15.5 12.5  Creatinine  0.6 - 1.1 mg/dL 1.3(H) 1.4(H) 1.3(H)  Sodium 136 - 145 mEq/L 140 140 140  Potassium 3.5 - 5.1 mEq/L 3.8 3.9 4.0  Chloride 98 - 107 mEq/L - - -  CO2 22 - 29 mEq/L 30(H) 29 28  Calcium 8.4 - 10.4 mg/dL 9.8 10.4 10.3  Total Protein 6.4 - 8.3 g/dL 6.7 7.1 6.7  Total Bilirubin 0.20 - 1.20 mg/dL 0.42 0.50 0.52  Alkaline Phos 40 - 150 U/L 88 81 70  AST 5 - 34 U/L 37(H) 54(H) 48(H)  ALT 0 - 55 U/L 45 50 59(H)    ASSESSMENT AND PLAN: 65 year old postmenopausal woman  1. Stage 1 invasive ductal carcinoma of  left breast cancer, ER/PR negative and HER-2 positive.   -She is clinically doing very well. It has been about 4 years since her initial diagnosis. -Her exam today and her last mammogram in Nov 2017 showed no evidence of recurrence. She had a repeat mammogram in November 2017, however these scans are not in her chart. I will request a copy of these scans from [facility].  -We'll continue surveillance with physical exam and annual mammogram -I encouraged her to continue healthy diet and regular exercise. She is physically more active lately. She has been trying to lose weight.   2. Osteopenia -her DEXA scan in 08/2015 showed mild osteopenia in left hip, I encourage her to take calcium, in addition to vitD   3. Mild leukopenia and persistent thrombocytopenia, secondary to liver cirrhosis -She had mild leukopenia and some cytopenia even before she starts chemotherapy. Her thrombocytopenia got worse after chemotherapy, her plt has been around 100 since she completed the chemotherapy. No significant anemia. -This is likely secondary to her liver cirrhosis, she is being followed by liver clinic -her blood counts are stable overall  4. Fatty liver and early liver cirrhosis -She also has mild elevated liver enzymes, and fatty liver on previous ultrasound in 2007, repeat ultrasound in 2016 showed early liver cirrhosis -His hepatitis B, C and HIV were negative -she will continue following up with Dawn at  the liver clinic every 6 months  5. B12 deficiency -She was getting B-12 injection once months at her primary care physician's office, and recently changed to oral B12,  intrinsic factor antibody was negative -will follow her B12 level    PLAN: -The patient had a screening mammogram at [facility] November 2017. I will request a copy of this scan.  -Return to clinic for follow-up and lab in 1 year   I spent 25 minutes counseling the patient face to face. The total time spent in the appointment was 30 minutes.  This document serves as a record  of services personally performed by Truitt Merle, MD. It was created on her behalf by Arlyce Harman, a trained medical scribe. The creation of this record is based on the scribe's personal observations and the provider's statements to them. This document has been checked and approved by the attending provider.  Truitt Merle   04/01/2016,

## 2016-04-01 ENCOUNTER — Telehealth: Payer: Self-pay | Admitting: Hematology

## 2016-04-01 ENCOUNTER — Encounter: Payer: Self-pay | Admitting: Hematology

## 2016-04-01 ENCOUNTER — Ambulatory Visit (HOSPITAL_BASED_OUTPATIENT_CLINIC_OR_DEPARTMENT_OTHER): Payer: Medicare Other | Admitting: Hematology

## 2016-04-01 ENCOUNTER — Other Ambulatory Visit (HOSPITAL_BASED_OUTPATIENT_CLINIC_OR_DEPARTMENT_OTHER): Payer: Medicare Other

## 2016-04-01 VITALS — BP 130/70 | HR 70 | Temp 98.7°F | Resp 18 | Ht 62.0 in | Wt 191.0 lb

## 2016-04-01 DIAGNOSIS — K760 Fatty (change of) liver, not elsewhere classified: Secondary | ICD-10-CM | POA: Diagnosis not present

## 2016-04-01 DIAGNOSIS — E538 Deficiency of other specified B group vitamins: Secondary | ICD-10-CM

## 2016-04-01 DIAGNOSIS — M8580 Other specified disorders of bone density and structure, unspecified site: Secondary | ICD-10-CM

## 2016-04-01 DIAGNOSIS — K7460 Unspecified cirrhosis of liver: Secondary | ICD-10-CM | POA: Diagnosis not present

## 2016-04-01 DIAGNOSIS — K7581 Nonalcoholic steatohepatitis (NASH): Secondary | ICD-10-CM

## 2016-04-01 DIAGNOSIS — D696 Thrombocytopenia, unspecified: Secondary | ICD-10-CM

## 2016-04-01 DIAGNOSIS — Z171 Estrogen receptor negative status [ER-]: Secondary | ICD-10-CM | POA: Diagnosis not present

## 2016-04-01 DIAGNOSIS — C50412 Malignant neoplasm of upper-outer quadrant of left female breast: Secondary | ICD-10-CM

## 2016-04-01 DIAGNOSIS — D61818 Other pancytopenia: Secondary | ICD-10-CM

## 2016-04-01 LAB — CBC WITH DIFFERENTIAL/PLATELET
BASO%: 0.6 % (ref 0.0–2.0)
BASOS ABS: 0 10*3/uL (ref 0.0–0.1)
EOS ABS: 0.1 10*3/uL (ref 0.0–0.5)
EOS%: 3.1 % (ref 0.0–7.0)
HEMATOCRIT: 37.3 % (ref 34.8–46.6)
HGB: 12.9 g/dL (ref 11.6–15.9)
LYMPH#: 1.4 10*3/uL (ref 0.9–3.3)
LYMPH%: 38.9 % (ref 14.0–49.7)
MCH: 31.3 pg (ref 25.1–34.0)
MCHC: 34.6 g/dL (ref 31.5–36.0)
MCV: 90.5 fL (ref 79.5–101.0)
MONO#: 0.3 10*3/uL (ref 0.1–0.9)
MONO%: 9.1 % (ref 0.0–14.0)
NEUT#: 1.7 10*3/uL (ref 1.5–6.5)
NEUT%: 48.3 % (ref 38.4–76.8)
PLATELETS: 106 10*3/uL — AB (ref 145–400)
RBC: 4.12 10*6/uL (ref 3.70–5.45)
RDW: 13.8 % (ref 11.2–14.5)
WBC: 3.5 10*3/uL — ABNORMAL LOW (ref 3.9–10.3)

## 2016-04-01 LAB — COMPREHENSIVE METABOLIC PANEL
ALT: 45 U/L (ref 0–55)
ANION GAP: 8 meq/L (ref 3–11)
AST: 37 U/L — ABNORMAL HIGH (ref 5–34)
Albumin: 3.8 g/dL (ref 3.5–5.0)
Alkaline Phosphatase: 88 U/L (ref 40–150)
BUN: 14.2 mg/dL (ref 7.0–26.0)
CALCIUM: 9.8 mg/dL (ref 8.4–10.4)
CHLORIDE: 102 meq/L (ref 98–109)
CO2: 30 mEq/L — ABNORMAL HIGH (ref 22–29)
CREATININE: 1.3 mg/dL — AB (ref 0.6–1.1)
EGFR: 43 mL/min/{1.73_m2} — AB (ref >90)
Glucose: 109 mg/dl (ref 70–140)
Potassium: 3.8 mEq/L (ref 3.5–5.1)
Sodium: 140 mEq/L (ref 136–145)
Total Bilirubin: 0.42 mg/dL (ref 0.20–1.20)
Total Protein: 6.7 g/dL (ref 6.4–8.3)

## 2016-04-01 NOTE — Telephone Encounter (Signed)
Appointments scheduled per 12/1 LOS. Patient given AVS report and calendars with future scheduled appointments. °

## 2016-04-28 ENCOUNTER — Other Ambulatory Visit: Payer: Medicare Other

## 2016-05-04 ENCOUNTER — Other Ambulatory Visit: Payer: Medicare Other

## 2016-05-11 ENCOUNTER — Ambulatory Visit
Admission: RE | Admit: 2016-05-11 | Discharge: 2016-05-11 | Disposition: A | Discharge status: Home / Self Care | Payer: Medicare Other | Source: Ambulatory Visit | Attending: Nurse Practitioner | Admitting: Nurse Practitioner

## 2016-05-11 DIAGNOSIS — K7469 Other cirrhosis of liver: Secondary | ICD-10-CM

## 2016-05-11 DIAGNOSIS — K7460 Unspecified cirrhosis of liver: Secondary | ICD-10-CM | POA: Diagnosis not present

## 2016-05-13 DIAGNOSIS — J209 Acute bronchitis, unspecified: Secondary | ICD-10-CM | POA: Diagnosis not present

## 2016-05-13 DIAGNOSIS — Z6834 Body mass index (BMI) 34.0-34.9, adult: Secondary | ICD-10-CM | POA: Diagnosis not present

## 2016-05-13 DIAGNOSIS — J019 Acute sinusitis, unspecified: Secondary | ICD-10-CM | POA: Diagnosis not present

## 2016-05-20 DIAGNOSIS — J209 Acute bronchitis, unspecified: Secondary | ICD-10-CM | POA: Diagnosis not present

## 2016-05-20 DIAGNOSIS — Z6834 Body mass index (BMI) 34.0-34.9, adult: Secondary | ICD-10-CM | POA: Diagnosis not present

## 2016-06-14 DIAGNOSIS — Z45321 Encounter for adjustment and management of cochlear device: Secondary | ICD-10-CM | POA: Diagnosis not present

## 2016-06-14 DIAGNOSIS — H903 Sensorineural hearing loss, bilateral: Secondary | ICD-10-CM | POA: Diagnosis not present

## 2016-07-14 ENCOUNTER — Encounter: Payer: Self-pay | Admitting: Gynecology

## 2016-07-14 ENCOUNTER — Ambulatory Visit (INDEPENDENT_AMBULATORY_CARE_PROVIDER_SITE_OTHER): Payer: Medicare Other | Admitting: Gynecology

## 2016-07-14 VITALS — BP 130/78 | Ht 61.0 in | Wt 191.0 lb

## 2016-07-14 DIAGNOSIS — Z01411 Encounter for gynecological examination (general) (routine) with abnormal findings: Secondary | ICD-10-CM | POA: Diagnosis not present

## 2016-07-14 DIAGNOSIS — N952 Postmenopausal atrophic vaginitis: Secondary | ICD-10-CM

## 2016-07-14 DIAGNOSIS — M8580 Other specified disorders of bone density and structure, unspecified site: Secondary | ICD-10-CM

## 2016-07-14 DIAGNOSIS — Z853 Personal history of malignant neoplasm of breast: Secondary | ICD-10-CM

## 2016-07-14 NOTE — Progress Notes (Signed)
    Maria Mckinney 02-08-1951 [id number]        66 y.o.  G1P0010 for breast and pelvic exam.  Past medical history,surgical history, problem list, medications, allergies, family history and social history were all reviewed and documented as reviewed in the EPIC chart.  ROS:  Performed with pertinent positives and negatives included in the history, assessment and plan.   Additional significant findings :  None   Exam: Copywriter, advertising Vitals:   07/14/16 1417  BP: 130/78  Weight: 191 lb (86.6 kg)  Height: 5\' 1"  (1.549 m)   Body mass index is 36.09 kg/m.  General appearance:  Normal affect, orientation and appearance. Skin: Grossly normal HEENT: Without gross lesions.  No cervical or supraclavicular adenopathy. Thyroid normal.  Lungs:  Clear without wheezing, rales or rhonchi Cardiac: RR, without RMG Abdominal:  Soft, nontender, without masses, guarding, rebound, organomegaly or hernia Breasts:  Examined lying and sitting without masses, retractions, discharge or axillary adenopathy. With well healed left lumpectomy scar Pelvic:  Ext, BUS, Vagina: With atrophic changes  Cervix: With atrophic changes  Uterus: Difficult to palpate but no gross masses or tenderness   Adnexa: Without gross masses or tenderness    Anus and perineum: Normal   Rectovaginal: Normal sphincter tone without palpated masses or tenderness.    Assessment/Plan:  66 y.o. G1P0010 female for breast and pelvic exam.   1. Postmenopausal/atrophic changes. No significant hot flushes, night sweats, vaginal dryness or any vaginal bleeding. Continue to monitor report any issues or bleeding. 2. Mammography 2017. Continue with annual mammography when due. Left-sided breast cancer. Exam NED. SBE monthly reviewed. 3. Pap smear 2017. No Pap smear done today. No history of abnormal Pap smears. 4. Colonoscopy 2015. Repeat at their recommended interval. 5. Osteopenia. DEXA 2017 T score -1.2 FRAX 8%/0.7%. Repeat at 2 year  interval. 6. Health maintenance. No routine lab work done as patient does this elsewhere. Follow up 1 year, sooner as needed.   Anastasio Auerbach MD, 2:42 PM 07/14/2016

## 2016-07-14 NOTE — Patient Instructions (Signed)

## 2016-07-26 DIAGNOSIS — R488 Other symbolic dysfunctions: Secondary | ICD-10-CM | POA: Diagnosis not present

## 2016-07-26 DIAGNOSIS — H903 Sensorineural hearing loss, bilateral: Secondary | ICD-10-CM | POA: Diagnosis not present

## 2016-08-31 DIAGNOSIS — K7469 Other cirrhosis of liver: Secondary | ICD-10-CM | POA: Diagnosis not present

## 2016-08-31 DIAGNOSIS — K7581 Nonalcoholic steatohepatitis (NASH): Secondary | ICD-10-CM | POA: Diagnosis not present

## 2016-09-01 ENCOUNTER — Other Ambulatory Visit: Payer: Self-pay | Admitting: Nurse Practitioner

## 2016-09-01 DIAGNOSIS — K7581 Nonalcoholic steatohepatitis (NASH): Secondary | ICD-10-CM

## 2016-09-14 ENCOUNTER — Encounter: Payer: Self-pay | Admitting: Gynecology

## 2016-09-15 DIAGNOSIS — R7303 Prediabetes: Secondary | ICD-10-CM | POA: Diagnosis not present

## 2016-09-15 DIAGNOSIS — E538 Deficiency of other specified B group vitamins: Secondary | ICD-10-CM | POA: Diagnosis not present

## 2016-09-15 DIAGNOSIS — E7800 Pure hypercholesterolemia, unspecified: Secondary | ICD-10-CM | POA: Diagnosis not present

## 2016-09-15 DIAGNOSIS — E039 Hypothyroidism, unspecified: Secondary | ICD-10-CM | POA: Diagnosis not present

## 2016-09-20 DIAGNOSIS — E7800 Pure hypercholesterolemia, unspecified: Secondary | ICD-10-CM | POA: Diagnosis not present

## 2016-09-20 DIAGNOSIS — M797 Fibromyalgia: Secondary | ICD-10-CM | POA: Diagnosis not present

## 2016-09-20 DIAGNOSIS — R7303 Prediabetes: Secondary | ICD-10-CM | POA: Diagnosis not present

## 2016-09-20 DIAGNOSIS — Z79899 Other long term (current) drug therapy: Secondary | ICD-10-CM | POA: Diagnosis not present

## 2016-09-20 DIAGNOSIS — M1990 Unspecified osteoarthritis, unspecified site: Secondary | ICD-10-CM | POA: Diagnosis not present

## 2016-09-20 DIAGNOSIS — K219 Gastro-esophageal reflux disease without esophagitis: Secondary | ICD-10-CM | POA: Diagnosis not present

## 2016-09-20 DIAGNOSIS — F418 Other specified anxiety disorders: Secondary | ICD-10-CM | POA: Diagnosis not present

## 2016-09-20 DIAGNOSIS — Z6835 Body mass index (BMI) 35.0-35.9, adult: Secondary | ICD-10-CM | POA: Diagnosis not present

## 2016-09-20 DIAGNOSIS — E039 Hypothyroidism, unspecified: Secondary | ICD-10-CM | POA: Diagnosis not present

## 2016-09-20 DIAGNOSIS — I10 Essential (primary) hypertension: Secondary | ICD-10-CM | POA: Diagnosis not present

## 2016-10-18 ENCOUNTER — Ambulatory Visit
Admission: RE | Admit: 2016-10-18 | Discharge: 2016-10-18 | Disposition: A | Discharge status: Home / Self Care | Payer: Medicare Other | Source: Ambulatory Visit | Attending: Nurse Practitioner | Admitting: Nurse Practitioner

## 2016-10-18 DIAGNOSIS — Z13818 Encounter for screening for other digestive system disorders: Secondary | ICD-10-CM | POA: Diagnosis not present

## 2016-10-18 DIAGNOSIS — K7581 Nonalcoholic steatohepatitis (NASH): Secondary | ICD-10-CM

## 2016-10-27 DIAGNOSIS — K7460 Unspecified cirrhosis of liver: Secondary | ICD-10-CM | POA: Diagnosis not present

## 2016-10-28 ENCOUNTER — Other Ambulatory Visit: Payer: Self-pay | Admitting: Nurse Practitioner

## 2016-12-21 ENCOUNTER — Telehealth: Payer: Self-pay

## 2016-12-21 DIAGNOSIS — I10 Essential (primary) hypertension: Secondary | ICD-10-CM | POA: Diagnosis not present

## 2016-12-21 DIAGNOSIS — F418 Other specified anxiety disorders: Secondary | ICD-10-CM | POA: Diagnosis not present

## 2016-12-21 DIAGNOSIS — E7800 Pure hypercholesterolemia, unspecified: Secondary | ICD-10-CM | POA: Diagnosis not present

## 2016-12-21 DIAGNOSIS — E039 Hypothyroidism, unspecified: Secondary | ICD-10-CM | POA: Diagnosis not present

## 2016-12-21 DIAGNOSIS — E669 Obesity, unspecified: Secondary | ICD-10-CM | POA: Diagnosis not present

## 2016-12-21 DIAGNOSIS — K219 Gastro-esophageal reflux disease without esophagitis: Secondary | ICD-10-CM | POA: Diagnosis not present

## 2016-12-21 DIAGNOSIS — R7303 Prediabetes: Secondary | ICD-10-CM | POA: Diagnosis not present

## 2016-12-21 DIAGNOSIS — M797 Fibromyalgia: Secondary | ICD-10-CM | POA: Diagnosis not present

## 2016-12-21 NOTE — Telephone Encounter (Signed)
Left voice message concerning change of time for 11/30 @8 :45 am. Labs, and provider visit

## 2016-12-31 ENCOUNTER — Other Ambulatory Visit: Payer: Self-pay | Admitting: Nurse Practitioner

## 2017-01-03 DIAGNOSIS — F418 Other specified anxiety disorders: Secondary | ICD-10-CM | POA: Diagnosis not present

## 2017-01-03 DIAGNOSIS — Z6835 Body mass index (BMI) 35.0-35.9, adult: Secondary | ICD-10-CM | POA: Diagnosis not present

## 2017-01-17 DIAGNOSIS — E669 Obesity, unspecified: Secondary | ICD-10-CM | POA: Diagnosis not present

## 2017-01-17 DIAGNOSIS — Z136 Encounter for screening for cardiovascular disorders: Secondary | ICD-10-CM | POA: Diagnosis not present

## 2017-01-17 DIAGNOSIS — Z0000 Encounter for general adult medical examination without abnormal findings: Secondary | ICD-10-CM | POA: Diagnosis not present

## 2017-01-17 DIAGNOSIS — E785 Hyperlipidemia, unspecified: Secondary | ICD-10-CM | POA: Diagnosis not present

## 2017-01-17 DIAGNOSIS — Z1389 Encounter for screening for other disorder: Secondary | ICD-10-CM | POA: Diagnosis not present

## 2017-01-17 DIAGNOSIS — Z23 Encounter for immunization: Secondary | ICD-10-CM | POA: Diagnosis not present

## 2017-01-17 DIAGNOSIS — Z6834 Body mass index (BMI) 34.0-34.9, adult: Secondary | ICD-10-CM | POA: Diagnosis not present

## 2017-01-17 DIAGNOSIS — Z9181 History of falling: Secondary | ICD-10-CM | POA: Diagnosis not present

## 2017-01-31 DIAGNOSIS — R748 Abnormal levels of other serum enzymes: Secondary | ICD-10-CM | POA: Diagnosis not present

## 2017-01-31 DIAGNOSIS — F418 Other specified anxiety disorders: Secondary | ICD-10-CM | POA: Diagnosis not present

## 2017-01-31 DIAGNOSIS — Z6834 Body mass index (BMI) 34.0-34.9, adult: Secondary | ICD-10-CM | POA: Diagnosis not present

## 2017-02-27 DIAGNOSIS — H26493 Other secondary cataract, bilateral: Secondary | ICD-10-CM | POA: Diagnosis not present

## 2017-02-27 DIAGNOSIS — H02059 Trichiasis without entropian unspecified eye, unspecified eyelid: Secondary | ICD-10-CM | POA: Diagnosis not present

## 2017-03-14 DIAGNOSIS — Z853 Personal history of malignant neoplasm of breast: Secondary | ICD-10-CM | POA: Diagnosis not present

## 2017-03-14 DIAGNOSIS — R928 Other abnormal and inconclusive findings on diagnostic imaging of breast: Secondary | ICD-10-CM | POA: Diagnosis not present

## 2017-03-15 DIAGNOSIS — J069 Acute upper respiratory infection, unspecified: Secondary | ICD-10-CM | POA: Diagnosis not present

## 2017-03-31 ENCOUNTER — Other Ambulatory Visit: Payer: Medicare Other

## 2017-03-31 ENCOUNTER — Telehealth: Payer: Self-pay | Admitting: Hematology

## 2017-03-31 ENCOUNTER — Ambulatory Visit: Payer: Medicare Other | Admitting: Hematology

## 2017-03-31 NOTE — Telephone Encounter (Signed)
Gave avs and calendar for December  °

## 2017-04-06 NOTE — Progress Notes (Signed)
[facility] follow-up note  Maria Mckinney [id number] 02-01-1951 66 y.o. 04/07/2017 5:20 PM  CC  [name], Lorin Mercy, MD [address] [place] [address] [name] [name] 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  PRIOR THERAPY:  #1Patient was originally seen in the multidisciplinary breast clinic for new diagnosis of left breast cancer that was 1m in the upper outer quadrant of the left breast.  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  There was an additional 4 cm area of normal signal.  Patient did not get an MRI secondary to cochlear implant.  She desires breast conservation.    #2 Patient is s/p port-a-cath placement for neoadjuvant chemotherapy consisting of Taxotere/carboplatinum/Herceptin.  She began cycle #1 on 02/29/2012.  She received Taxotere and carboplatinum every 21 days with Herceptin giving weekly.  She completed a total of 4 cycles of Taxotere and Carboplatinum on 05/03/12.    #3 Herceptin every 3 weeks beginning 05/23/12 - 03/14/13  #4 Lumpectomy by Dr. WDonne Hazelon 06/27/2012 showed complete pathologic response to neoadjuvant chemotherapy.  Sentinel nodes were negative.  #5 Patient underwent radiation therapy from on 08/14/12 - 09/28/12.  [place]    CURRENT THERAPY: Observation  INTERVAL HISTORY:  BMirahreturns for follow-up. She was last seen by me a year ago. She presented to the clinic today noting she has been doing well. She has been cancer free for 5 years and wondered if she was good to be discharged. She recently had mammogram that was normal in 03/2017. She has not seen Maria Mckinney since 09/2016. She had blood work and an [place] She saw Dr. BMelina Copain [place] will follow her about this. That way  she can go to one location. She notes she has not had any alcohol and has been watching her diet. She denies any bleeding or bruising. She follows up with her PCP regularly. She asked about her records showing she had kidney disease. She notes her father had kidney issues that developed into kidney cancer.     Past Medical History: Past Medical History:  Diagnosis Date  . Anxiety   . Arthritis   . B12 deficiency anemia 10/18/2012  . Cancer ([facility]    Breast cancer  . [place]osis of [place] ([place]   . 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 Dysrhythmia    irreg hr  . Fibromyalgia   . GERD (gastroesophageal reflux disease)   . HTN (hypertension)   . PONV (postoperative nausea and vomiting)   . Wears hearing aid    left ear    Past Surgical History: Past Surgical History:  Procedure Laterality Date  . APPENDECTOMY    . BREAST LUMPECTOMY WITH NEEDLE LOCALIZATION AND AXILLARY SENTINEL LYMPH NODE BX Left 06/27/2012   Procedure: BREAST LUMPECTOMY WITH NEEDLE LOCALIZATION AND AXILLARY SENTINEL LYMPH NODE BIOPSY;  Surgeon: [name];  [place] [facility]  Service: General;  Laterality: Left;  [facility] axillary sentinel node biopsy nuclear med 1400  . COCHLEAR IMPLANT    . COCHLEAR IMPLANT Left 10/2015  . COMBINED HYSTEROSCOPY DIAGNOSTIC / D&C    . PORT-A-CATH REMOVAL N/A 04/11/2013   Procedure: MINOR REMOVAL PORT-A-CATH;  Surgeon: [name];  Location: [facility];  Service: General;  Laterality: N/A;  . PORTACATH PLACEMENT  02/22/2012   Procedure: INSERTION PORT-A-CATH;  Surgeon: Rolm Bookbinder, MD;  Location: [facility];  Service: General;  Laterality: Right;    Family History: Family History  Problem Relation Age of Onset  . Cancer Mother        "tumor of face removed"  . Cancer Father        kidney removed  . Cancer Maternal Aunt        stomach & Pancreatic    Social  History Social History   Tobacco Use  . Smoking status: Never Smoker  . Smokeless tobacco: Never Used  Substance Use Topics  . Alcohol use: Yes    Alcohol/week: 0.0 oz  . Drug use: No    Allergies: Allergies  Allergen Reactions  . Lyrica [Pregabalin] Swelling    Swelling of eyes & Hands & rash  . Acyclovir And Related   . Ciprofloxacin Hcl Rash  . Bactrim [Sulfamethoxazole-Trimethoprim]   . Codeine Nausea Only    Current Medications: Current Outpatient Medications  Medication Sig Dispense Refill  . aspirin 81 MG tablet Take 81 mg by mouth daily.    . bisoprolol-hydrochlorothiazide (ZIAC) 5-6.25 MG per tablet Take 1 tablet by mouth daily.    . Chelated Magnesium 100 MG TABS Take 250 mg by mouth daily.    . cholecalciferol (VITAMIN D) 1000 UNITS tablet Take 5,000 Units by mouth daily.    . cyclobenzaprine (FLEXERIL) 10 MG tablet Take 10 mg by mouth daily as needed for muscle spasms. For muscle pain.    . DULoxetine (CYMBALTA) 60 MG capsule Take 1 capsule (60 mg total) by mouth daily. 30 capsule 3  . esomeprazole (NEXIUM) 40 MG capsule Take 22.3 mg by mouth daily before breakfast.    . HYDROcodone-acetaminophen (NORCO/VICODIN) 5-325 MG per tablet Take 1 tablet by mouth every 6 (six) hours as needed for pain.    Marland Kitchen levothyroxine (SYNTHROID, LEVOTHROID) 100 MCG tablet     . potassium chloride (K-DUR) 10 MEQ tablet Take 20 mEq by mouth daily.     . ranitidine (ZANTAC) 300 MG tablet Take 1 tablet by mouth daily.    . vitamin B-12 (CYANOCOBALAMIN) 250 MCG tablet Take 5,000 mcg by mouth daily.      No current facility-administered medications for this visit.    REVIEW OF SYSTEMS: GENERAL:alert, no distress and comfortable SKIN: skin color, texture, turgor are normal, no rashes or significant lesions  EYES: normal, conjunctiva are pink and non-injected, sclera clear OROPHARYNX:no exudate, no erythema and lips, buccal mucosa, and tongue normal  NECK: supple, thyroid normal size,  non-tender, without nodularity LYMPH:  no palpable lymphadenopathy in the cervical, axillary or inguinal LUNGS: clear to auscultation and percussion with normal breathing effort HEART: regular rate & rhythm and no murmurs and no lower extremity edema ABDOMEN:abdomen soft, non-tender and normal bowel sounds Musculoskeletal:no cyanosis of digits and no clubbing  PSYCH: alert & oriented x 3 with fluent speech NEURO: no focal motor/sensory deficits A 10 point review of systems was conducted and is otherwise negative except for what is noted above.    Health Maintenance  Mammogram:03/2017 Colonoscopy: 07/2013, 10 year f/u recommended Bone Density Scan: 08/18/2015 osteopenia in left hip (T score -1.2)  Pap Smear: cannot recall Eye Exam: 2015 Lipid Panel: 2016   PHYSICAL EXAMINATION: Blood pressure (!) 143/61, pulse 70, temperature 98.9 F (37.2 C), temperature source Oral, resp. rate 18, height _0  (1.549  m), weight 184 lb 14.4 oz (83.9 kg), SpO2 96 %.   GENERAL: Patient is a well appearing female in no acute distress HEENT:  Sclerae anicteric.  Oropharynx clear and moist. No ulcerations or evidence of oropharyngeal candidiasis. Neck is supple.  NODES:  No cervical, supraclavicular, or axillary lymphadenopathy palpated.  BREAST EXAM:  Left lumpectomy without nodularity or sign of recurrence.  No nodules or masses, right breast no nodules or masses, benign bilateral breast exam. (+) There is scar tissue present in the right upper outer quadrant and a scar underneath the incision site.  LUNGS:  Clear to auscultation bilaterally.  No wheezes or rhonchi. HEART:  Regular rate and rhythm. No murmur appreciated. ABDOMEN:  Soft, nontender.  Positive, normoactive bowel sounds. No organomegaly palpated. MSK:  No focal spinal tenderness to palpation. Full range of motion bilaterally in the upper extremities. EXTREMITIES:  No peripheral edema.   SKIN:  Clear with no obvious rashes or skin changes. No  nail dyscrasia. NEURO:  Nonfocal. Well oriented.  Appropriate affect.  ECOG PERFORMANCE STATUS: 1 - Symptomatic but completely ambulatory   STUDIES/RESULTS:  Diagnostic Mammogram Bilateral 03/14/17   IMPRESSION:  There is no mammpgraphic evidence of malignancy. A follow-up mammogram in 12 months is recommended.   LABS: CBC Latest Ref Rng & Units 04/07/2017 04/01/2016 10/01/2015  WBC 3.9 - 10.3 10e3/uL 3.5(L) 3.5(L) 4.1  Hemoglobin 11.6 - 15.9 g/dL 11.9 12.9 13.4  Hematocrit 34.8 - 46.6 % 35.2 37.3 38.7  Platelets 145 - 400 10e3/uL 101(L) 106(L) 113(L)    CMP Latest Ref Rng & Units 04/07/2017 04/01/2016 10/01/2015  Glucose 70 - 140 mg/dl 106 109 104  BUN 7.0 - 26.0 mg/dL 16.5 14.2 15.5  Creatinine 0.6 - 1.1 mg/dL 1.4(H) 1.3(H) 1.4(H)  Sodium 136 - 145 mEq/L 139 140 140  Potassium 3.5 - 5.1 mEq/L 3.3(L) 3.8 3.9  Chloride 98 - 107 mEq/L - - -  CO2 22 - 29 mEq/L 28 30(H) 29  Calcium 8.4 - 10.4 mg/dL 10.0 9.8 10.4  Total Protein 6.4 - 8.3 g/dL 6.8 6.7 7.1  Total Bilirubin 0.20 - 1.20 mg/dL 0.44 0.42 0.50  Alkaline Phos 40 - 150 U/L 88 88 81  AST 5 - 34 U/L 45(H) 37(H) 54(H)  ALT 0 - 55 U/L 36 45 50    ASSESSMENT AND PLAN: 66 year old postmenopausal woman  1. Stage 1 invasive ductal carcinoma of  left breast cancer, ER/PR negative and HER-2 positive.   -She is clinically doing very well.  -Her mammogram in Nov 2018 showed no evidence of recurrence. -She is clinically doing well. Lab reviewed, stable neutropenia and thrombocytopenia. Her physical exam and her 03/2017 mammogram were unremarkable. There is no clinical concern for recurrence. -It has been about 5 years since her initial diagnosis so her risk of recurrence is minimal now, although she is still at risk for second breast cancer. I strongly encouraged her to continue with yearly mammograms and breast exams.  Patient would prefer to follow-up with her primary care physician, and I will discharge her from my clinic -F/u open, she  can contact me if she has any questions regarding her breast cancer or liver.    2. Osteopenia -her DEXA scan in 08/2015 showed mild osteopenia in left hip, I encourage her to take calcium, in addition to vitD  -I recommend she continue with DEXA scan every 2 years.   3. Mild leukopenia and persistent thrombocytopenia, secondary to liver cirrhosis -She had mild leukopenia and some cytopenia  even before she starts chemotherapy. Her thrombocytopenia got worse after chemotherapy, her plt has been around 100 since she completed the chemotherapy. No significant anemia. -This is likely secondary to her liver cirrhosis, she is being followed by liver clinic -her mild leukopenia is stable, her thrombocytopenia has dropped slightly, now 101K (04/07/17)  4. NASH and early liver cirrhosis -She also has mild elevated liver enzymes, and fatty liver on previous ultrasound in 2007, repeat ultrasound in 2016 showed early liver cirrhosis -His hepatitis B, C and HIV were negative -she will continue following up with Maria Mckinney at the liver clinic every 6 months -She will now follow up with Dr. Melina Copa and will repeat abd [place] in 09/2017.  -she will continue f/u with Dr. Melina Copa for cirrhosis and liver cancer screening   5. B12 deficiency -She was getting B-12 injection once months at her primary care physician's office, and recently changed to oral B12,  intrinsic factor antibody was negative -will follow her B12 level    6. CKD stage III -I explained her prior labs that showed stage III chronic kidney disease.  I am not certain about the etiology of her chronic kidney disease.  I suggest she avoids Aspirin and aleve, drink plenty of water and she should follow up with her PCP about it.  -Will notify her of her CMP results.   PLAN: -she will f/u with her PCP for breast cancer surveillance in the future -F/u as needed with me   I spent 20 minutes counseling the patient face to face. The total time spent in the  appointment was 25 minutes.  This document serves as a record of services personally performed by Truitt Merle, MD. It was created on her behalf by Arlyce Harman, a trained medical scribe. The creation of this record is based on the scribe's personal observations and the provider's statements to them. This document has been checked and approved by the attending provider.  Truitt Merle   04/07/2017 5:20 PM   This document serves as a record of services personally performed by Truitt Merle, MD. It was created on her behalf by Joslyn Devon, a trained medical scribe. The creation of this record is based on the scribe's personal observations and the provider's statements to them.    I have reviewed the above documentation for accuracy and completeness, and I agree with the above.

## 2017-04-07 ENCOUNTER — Encounter: Payer: Self-pay | Admitting: Hematology

## 2017-04-07 ENCOUNTER — Other Ambulatory Visit (HOSPITAL_BASED_OUTPATIENT_CLINIC_OR_DEPARTMENT_OTHER): Payer: Medicare Other

## 2017-04-07 ENCOUNTER — Ambulatory Visit (HOSPITAL_BASED_OUTPATIENT_CLINIC_OR_DEPARTMENT_OTHER): Payer: Medicare Other | Admitting: Hematology

## 2017-04-07 VITALS — BP 143/61 | HR 70 | Temp 98.9°F | Resp 18 | Ht 61.0 in | Wt 184.9 lb

## 2017-04-07 DIAGNOSIS — Z853 Personal history of malignant neoplasm of breast: Secondary | ICD-10-CM | POA: Diagnosis not present

## 2017-04-07 DIAGNOSIS — K7460 Unspecified cirrhosis of liver: Secondary | ICD-10-CM | POA: Diagnosis not present

## 2017-04-07 DIAGNOSIS — M8580 Other specified disorders of bone density and structure, unspecified site: Secondary | ICD-10-CM | POA: Diagnosis not present

## 2017-04-07 DIAGNOSIS — E538 Deficiency of other specified B group vitamins: Secondary | ICD-10-CM | POA: Diagnosis not present

## 2017-04-07 DIAGNOSIS — K7581 Nonalcoholic steatohepatitis (NASH): Secondary | ICD-10-CM

## 2017-04-07 DIAGNOSIS — D696 Thrombocytopenia, unspecified: Secondary | ICD-10-CM | POA: Diagnosis not present

## 2017-04-07 DIAGNOSIS — N183 Chronic kidney disease, stage 3 (moderate): Secondary | ICD-10-CM

## 2017-04-07 DIAGNOSIS — D61818 Other pancytopenia: Secondary | ICD-10-CM

## 2017-04-07 DIAGNOSIS — Z171 Estrogen receptor negative status [ER-]: Principal | ICD-10-CM

## 2017-04-07 DIAGNOSIS — C50412 Malignant neoplasm of upper-outer quadrant of left female breast: Secondary | ICD-10-CM

## 2017-04-07 DIAGNOSIS — K760 Fatty (change of) liver, not elsewhere classified: Secondary | ICD-10-CM

## 2017-04-07 LAB — COMPREHENSIVE METABOLIC PANEL
ALT: 36 U/L (ref 0–55)
ANION GAP: 11 meq/L (ref 3–11)
AST: 45 U/L — ABNORMAL HIGH (ref 5–34)
Albumin: 4 g/dL (ref 3.5–5.0)
Alkaline Phosphatase: 88 U/L (ref 40–150)
BILIRUBIN TOTAL: 0.44 mg/dL (ref 0.20–1.20)
BUN: 16.5 mg/dL (ref 7.0–26.0)
CALCIUM: 10 mg/dL (ref 8.4–10.4)
CO2: 28 mEq/L (ref 22–29)
CREATININE: 1.4 mg/dL — AB (ref 0.6–1.1)
Chloride: 101 mEq/L (ref 98–109)
EGFR: 40 mL/min/{1.73_m2} — ABNORMAL LOW (ref >60)
Glucose: 106 mg/dl (ref 70–140)
Potassium: 3.3 mEq/L — ABNORMAL LOW (ref 3.5–5.1)
Sodium: 139 mEq/L (ref 136–145)
TOTAL PROTEIN: 6.8 g/dL (ref 6.4–8.3)

## 2017-04-07 LAB — CBC WITH DIFFERENTIAL/PLATELET
BASO%: 1.1 % (ref 0.0–2.0)
Basophils Absolute: 0 10*3/uL (ref 0.0–0.1)
EOS%: 4 % (ref 0.0–7.0)
Eosinophils Absolute: 0.1 10*3/uL (ref 0.0–0.5)
HCT: 35.2 % (ref 34.8–46.6)
HEMOGLOBIN: 11.9 g/dL (ref 11.6–15.9)
LYMPH%: 43 % (ref 14.0–49.7)
MCH: 29.6 pg (ref 25.1–34.0)
MCHC: 33.7 g/dL (ref 31.5–36.0)
MCV: 87.7 fL (ref 79.5–101.0)
MONO#: 0.4 10*3/uL (ref 0.1–0.9)
MONO%: 10.5 % (ref 0.0–14.0)
NEUT%: 41.4 % (ref 38.4–76.8)
NEUTROS ABS: 1.5 10*3/uL (ref 1.5–6.5)
Platelets: 101 10*3/uL — ABNORMAL LOW (ref 145–400)
RBC: 4.02 10*6/uL (ref 3.70–5.45)
RDW: 14.2 % (ref 11.2–14.5)
WBC: 3.5 10*3/uL — AB (ref 3.9–10.3)
lymph#: 1.5 10*3/uL (ref 0.9–3.3)

## 2017-04-07 NOTE — Addendum Note (Signed)
Addended by: Elray Buba LE on: 04/07/2017 06:26 PM   Modules accepted: Orders

## 2017-04-13 DIAGNOSIS — B373 Candidiasis of vulva and vagina: Secondary | ICD-10-CM | POA: Diagnosis not present

## 2017-04-13 DIAGNOSIS — Z23 Encounter for immunization: Secondary | ICD-10-CM | POA: Diagnosis not present

## 2017-04-13 DIAGNOSIS — N390 Urinary tract infection, site not specified: Secondary | ICD-10-CM | POA: Diagnosis not present

## 2017-04-13 DIAGNOSIS — R252 Cramp and spasm: Secondary | ICD-10-CM | POA: Diagnosis not present

## 2017-04-13 DIAGNOSIS — Z6833 Body mass index (BMI) 33.0-33.9, adult: Secondary | ICD-10-CM | POA: Diagnosis not present

## 2017-04-21 DIAGNOSIS — K7460 Unspecified cirrhosis of liver: Secondary | ICD-10-CM | POA: Diagnosis not present

## 2017-05-09 DIAGNOSIS — K7460 Unspecified cirrhosis of liver: Secondary | ICD-10-CM | POA: Diagnosis not present

## 2017-06-15 DIAGNOSIS — M1990 Unspecified osteoarthritis, unspecified site: Secondary | ICD-10-CM | POA: Diagnosis not present

## 2017-06-15 DIAGNOSIS — J069 Acute upper respiratory infection, unspecified: Secondary | ICD-10-CM | POA: Diagnosis not present

## 2017-06-29 DIAGNOSIS — H04123 Dry eye syndrome of bilateral lacrimal glands: Secondary | ICD-10-CM | POA: Diagnosis not present

## 2017-06-29 DIAGNOSIS — H02059 Trichiasis without entropian unspecified eye, unspecified eyelid: Secondary | ICD-10-CM | POA: Diagnosis not present

## 2017-07-06 DIAGNOSIS — M1990 Unspecified osteoarthritis, unspecified site: Secondary | ICD-10-CM | POA: Diagnosis not present

## 2017-07-06 DIAGNOSIS — M654 Radial styloid tenosynovitis [de Quervain]: Secondary | ICD-10-CM | POA: Diagnosis not present

## 2017-07-06 DIAGNOSIS — Z6833 Body mass index (BMI) 33.0-33.9, adult: Secondary | ICD-10-CM | POA: Diagnosis not present

## 2017-08-01 DIAGNOSIS — E039 Hypothyroidism, unspecified: Secondary | ICD-10-CM | POA: Diagnosis not present

## 2017-08-01 DIAGNOSIS — I10 Essential (primary) hypertension: Secondary | ICD-10-CM | POA: Diagnosis not present

## 2017-08-01 DIAGNOSIS — E7800 Pure hypercholesterolemia, unspecified: Secondary | ICD-10-CM | POA: Diagnosis not present

## 2017-08-02 DIAGNOSIS — M797 Fibromyalgia: Secondary | ICD-10-CM | POA: Diagnosis not present

## 2017-08-02 DIAGNOSIS — F418 Other specified anxiety disorders: Secondary | ICD-10-CM | POA: Diagnosis not present

## 2017-08-02 DIAGNOSIS — N390 Urinary tract infection, site not specified: Secondary | ICD-10-CM | POA: Diagnosis not present

## 2017-08-02 DIAGNOSIS — R7303 Prediabetes: Secondary | ICD-10-CM | POA: Diagnosis not present

## 2017-08-02 DIAGNOSIS — K219 Gastro-esophageal reflux disease without esophagitis: Secondary | ICD-10-CM | POA: Diagnosis not present

## 2017-08-02 DIAGNOSIS — E669 Obesity, unspecified: Secondary | ICD-10-CM | POA: Diagnosis not present

## 2017-08-02 DIAGNOSIS — I10 Essential (primary) hypertension: Secondary | ICD-10-CM | POA: Diagnosis not present

## 2017-08-02 DIAGNOSIS — E7800 Pure hypercholesterolemia, unspecified: Secondary | ICD-10-CM | POA: Diagnosis not present

## 2017-08-02 DIAGNOSIS — E039 Hypothyroidism, unspecified: Secondary | ICD-10-CM | POA: Diagnosis not present

## 2017-08-02 DIAGNOSIS — Z6835 Body mass index (BMI) 35.0-35.9, adult: Secondary | ICD-10-CM | POA: Diagnosis not present

## 2017-10-30 DIAGNOSIS — K7460 Unspecified cirrhosis of liver: Secondary | ICD-10-CM | POA: Diagnosis not present

## 2017-11-07 DIAGNOSIS — K7460 Unspecified cirrhosis of liver: Secondary | ICD-10-CM | POA: Diagnosis not present

## 2017-12-09 IMAGING — US US ABDOMEN COMPLETE
1 series · 14 of 25 positions shown · non-contrast
Comparison: 05/11/2016.

CLINICAL DATA: Cirrhosis screening.

EXAM:
ABDOMEN ULTRASOUND COMPLETE

[Series 1: us abdomen complete · 0.16mm/px · 14 of 94 slices shown]
[im 1/94]
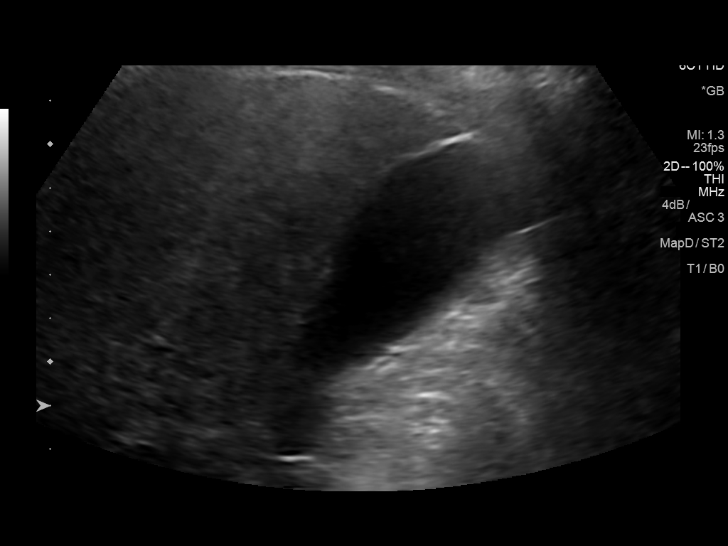
[im 8/94]
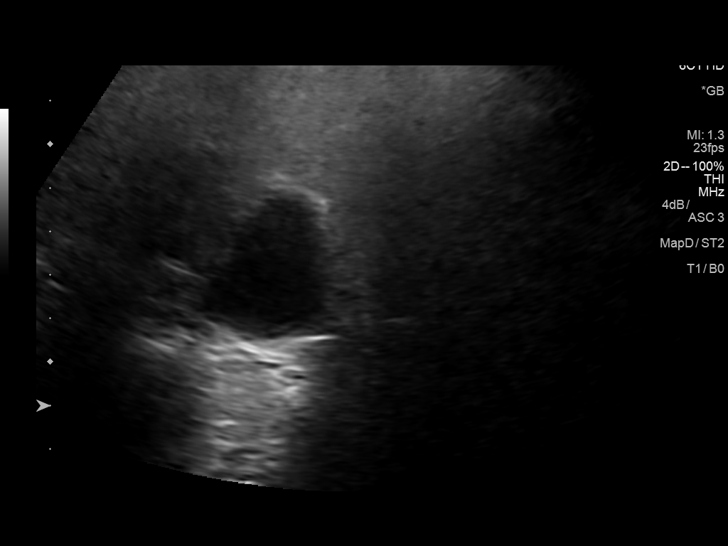
[im 16/94]
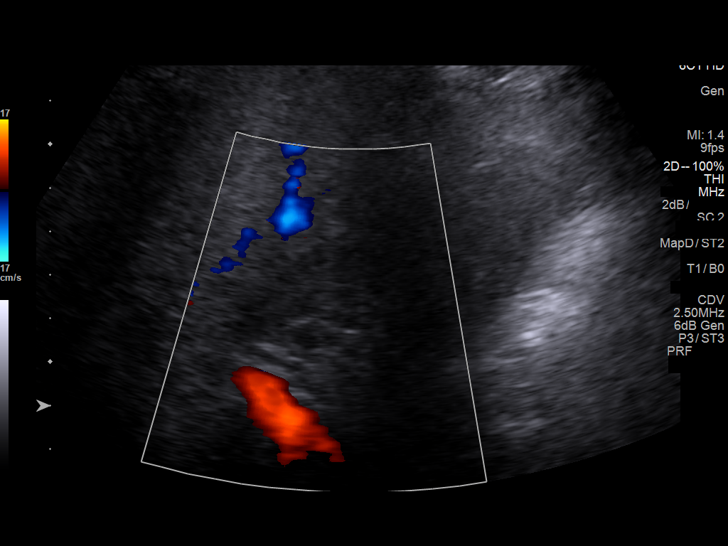
[im 24/94]
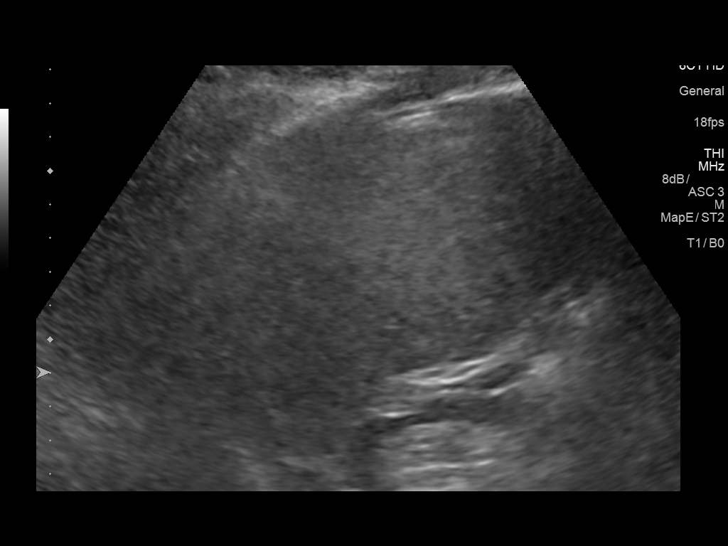
[im 32/94]
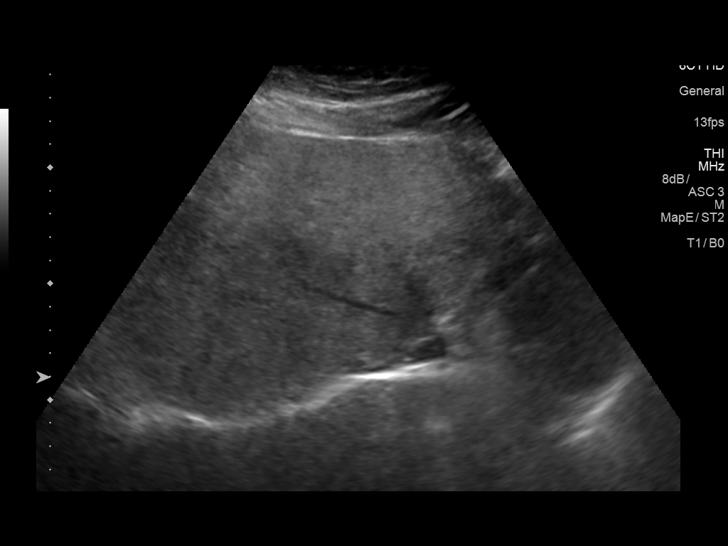
[im 35/94]
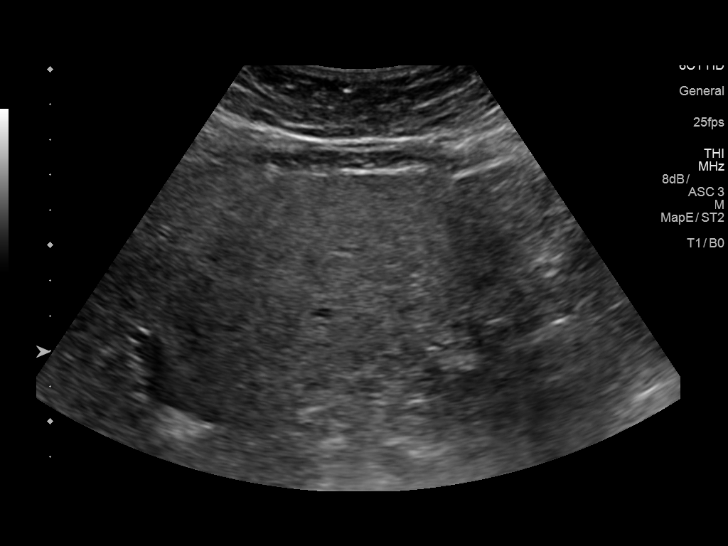
[im 43/94]
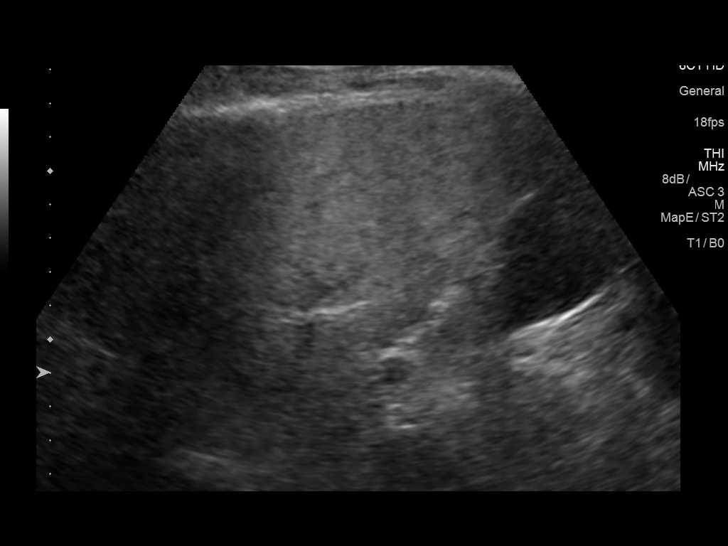
[im 51/94]
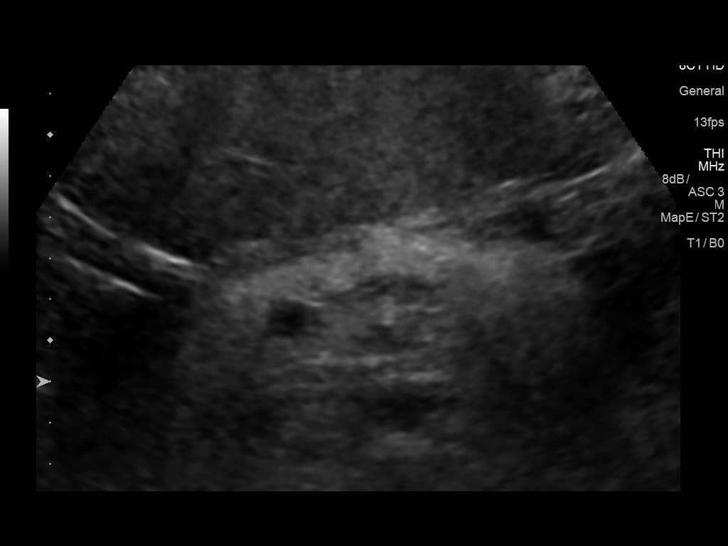
[im 59/94]
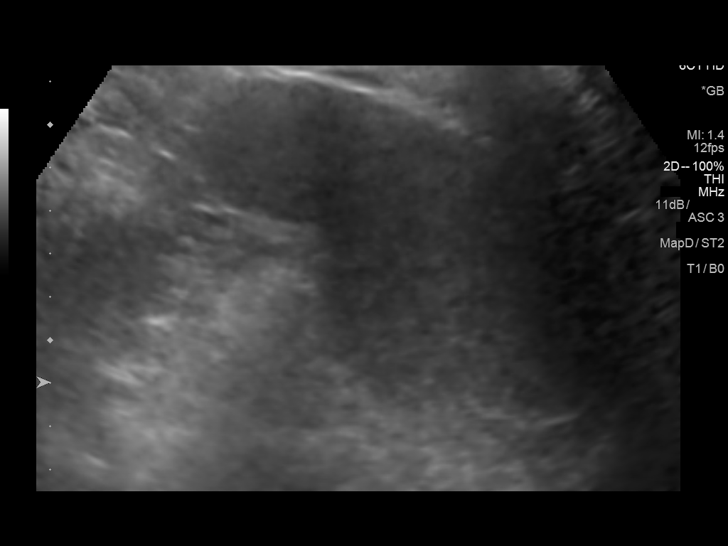
[im 63/94]
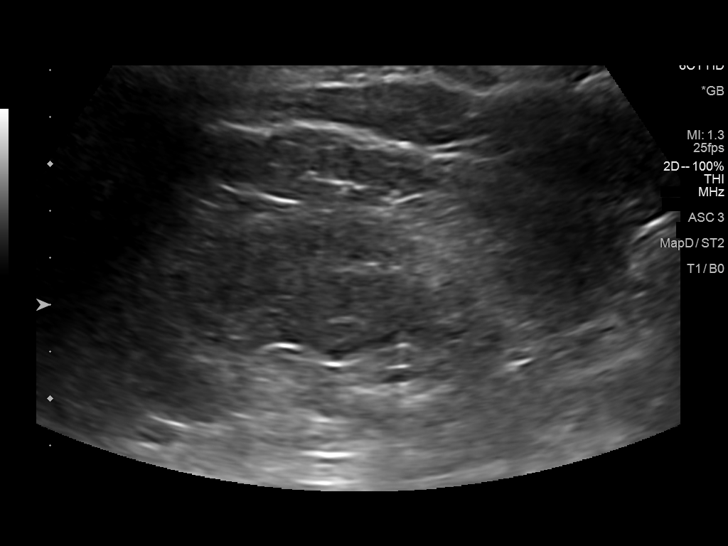
[im 70/94]
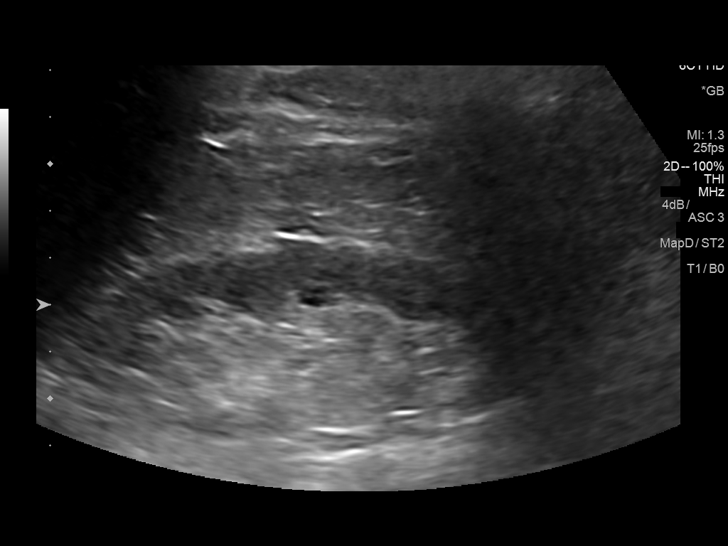
[im 78/94]
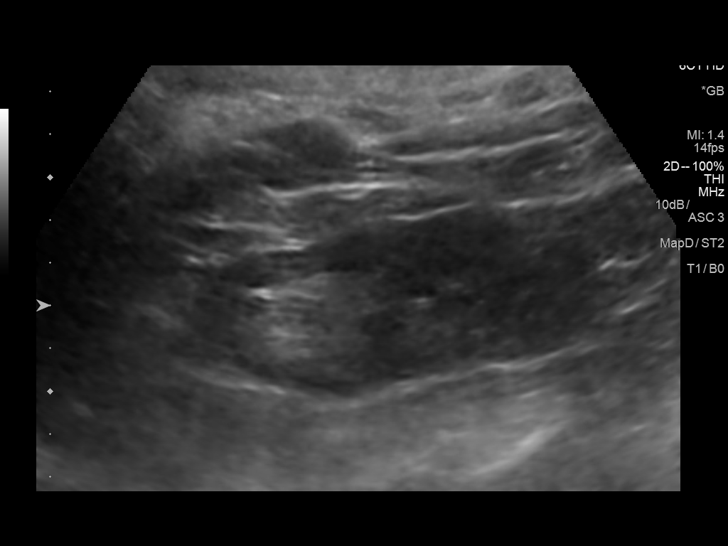
[im 86/94]
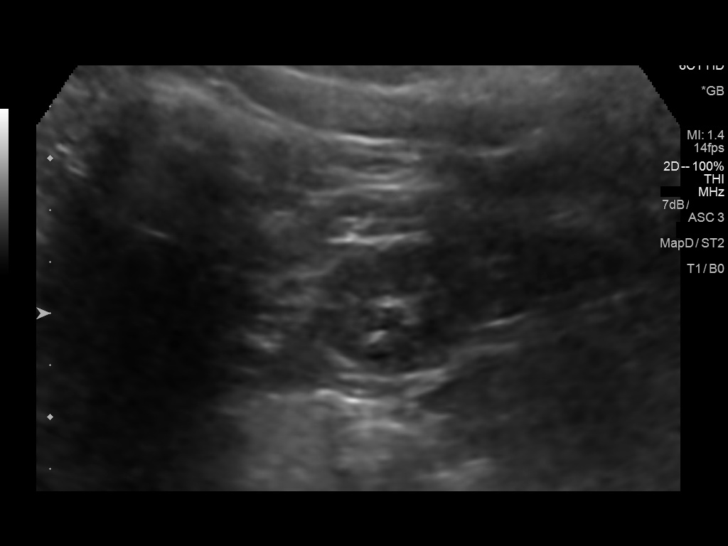
[im 94/94]
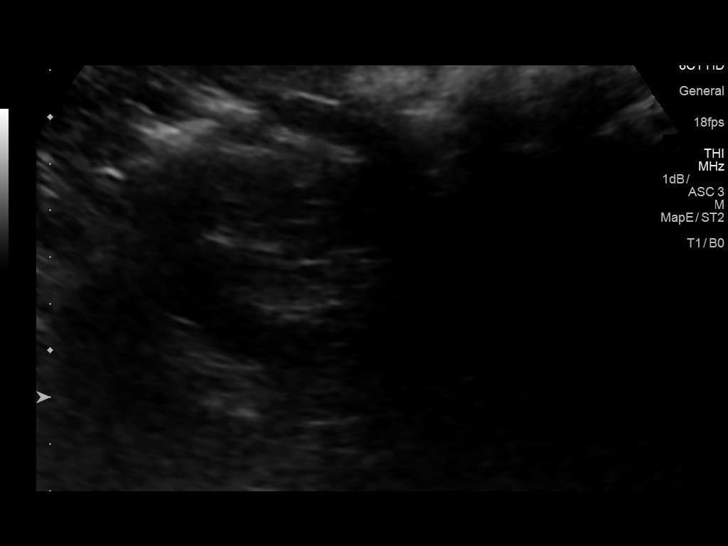

[14 of 25 positions shown; findings below may reference images not displayed]

FINDINGS: Gallbladder: No gallstones or wall thickening visualized. No
sonographic Murphy sign noted by sonographer.

Common bile duct: Diameter: 3.3 mm

Liver: There echotexture is coarse. Contour is slightly nodular.
These findings are consistent with cirrhosis. Portal vein is patent
with normal directional flow. No focal hepatic abnormality
identified.

IVC: No abnormality visualized.

Pancreas: Visualized portion unremarkable.

Spleen: Size and appearance within normal limits.

Right Kidney: Length: 10.3 cm. Echogenicity within normal limits. No
mass or hydronephrosis visualized.

Left Kidney: Length: 11.0 cm. Echogenicity within normal limits. No
mass or hydronephrosis visualized.

Abdominal aorta: No aneurysm visualized.

Other findings: None.
IMPRESSION: For 1 Coarse hepatic echotexture with nodular hepatic contour.
Findings consist with cirrhosis. Portal vein is patent with normal
directional flow. No focal hepatic abnormality identified.

2. Exam otherwise negative.  No gallstones or biliary distention.

## 2018-01-18 DIAGNOSIS — Z0000 Encounter for general adult medical examination without abnormal findings: Secondary | ICD-10-CM | POA: Diagnosis not present

## 2018-01-18 DIAGNOSIS — E669 Obesity, unspecified: Secondary | ICD-10-CM | POA: Diagnosis not present

## 2018-01-18 DIAGNOSIS — Z136 Encounter for screening for cardiovascular disorders: Secondary | ICD-10-CM | POA: Diagnosis not present

## 2018-01-18 DIAGNOSIS — Z6836 Body mass index (BMI) 36.0-36.9, adult: Secondary | ICD-10-CM | POA: Diagnosis not present

## 2018-01-18 DIAGNOSIS — E785 Hyperlipidemia, unspecified: Secondary | ICD-10-CM | POA: Diagnosis not present

## 2018-01-18 DIAGNOSIS — Z23 Encounter for immunization: Secondary | ICD-10-CM | POA: Diagnosis not present

## 2018-02-01 DIAGNOSIS — E7800 Pure hypercholesterolemia, unspecified: Secondary | ICD-10-CM | POA: Diagnosis not present

## 2018-02-01 DIAGNOSIS — E039 Hypothyroidism, unspecified: Secondary | ICD-10-CM | POA: Diagnosis not present

## 2018-02-01 DIAGNOSIS — I10 Essential (primary) hypertension: Secondary | ICD-10-CM | POA: Diagnosis not present

## 2018-02-08 DIAGNOSIS — Z9181 History of falling: Secondary | ICD-10-CM | POA: Diagnosis not present

## 2018-02-08 DIAGNOSIS — E7800 Pure hypercholesterolemia, unspecified: Secondary | ICD-10-CM | POA: Diagnosis not present

## 2018-02-08 DIAGNOSIS — Z1339 Encounter for screening examination for other mental health and behavioral disorders: Secondary | ICD-10-CM | POA: Diagnosis not present

## 2018-02-08 DIAGNOSIS — E669 Obesity, unspecified: Secondary | ICD-10-CM | POA: Diagnosis not present

## 2018-02-08 DIAGNOSIS — I10 Essential (primary) hypertension: Secondary | ICD-10-CM | POA: Diagnosis not present

## 2018-02-08 DIAGNOSIS — K219 Gastro-esophageal reflux disease without esophagitis: Secondary | ICD-10-CM | POA: Diagnosis not present

## 2018-02-08 DIAGNOSIS — E039 Hypothyroidism, unspecified: Secondary | ICD-10-CM | POA: Diagnosis not present

## 2018-03-13 ENCOUNTER — Telehealth: Payer: Self-pay

## 2018-03-13 NOTE — Telephone Encounter (Signed)
Faxed signed order for mammogram to [facility], sent it for scan

## 2018-03-27 DIAGNOSIS — Z853 Personal history of malignant neoplasm of breast: Secondary | ICD-10-CM | POA: Diagnosis not present

## 2018-05-08 DIAGNOSIS — K7460 Unspecified cirrhosis of liver: Secondary | ICD-10-CM | POA: Diagnosis not present

## 2018-05-08 DIAGNOSIS — Z8719 Personal history of other diseases of the digestive system: Secondary | ICD-10-CM | POA: Diagnosis not present

## 2018-05-16 DIAGNOSIS — K7460 Unspecified cirrhosis of liver: Secondary | ICD-10-CM | POA: Diagnosis not present

## 2018-05-17 DIAGNOSIS — E876 Hypokalemia: Secondary | ICD-10-CM | POA: Diagnosis not present

## 2018-05-17 DIAGNOSIS — K7460 Unspecified cirrhosis of liver: Secondary | ICD-10-CM | POA: Diagnosis not present

## 2018-06-08 DIAGNOSIS — E538 Deficiency of other specified B group vitamins: Secondary | ICD-10-CM | POA: Diagnosis not present

## 2018-06-08 DIAGNOSIS — E876 Hypokalemia: Secondary | ICD-10-CM | POA: Diagnosis not present

## 2018-07-17 DIAGNOSIS — R0602 Shortness of breath: Secondary | ICD-10-CM | POA: Diagnosis not present

## 2018-07-17 DIAGNOSIS — G4733 Obstructive sleep apnea (adult) (pediatric): Secondary | ICD-10-CM | POA: Diagnosis not present

## 2018-07-18 DIAGNOSIS — G4733 Obstructive sleep apnea (adult) (pediatric): Secondary | ICD-10-CM | POA: Diagnosis not present

## 2018-07-18 DIAGNOSIS — R0602 Shortness of breath: Secondary | ICD-10-CM | POA: Diagnosis not present

## 2018-08-15 DIAGNOSIS — I10 Essential (primary) hypertension: Secondary | ICD-10-CM | POA: Diagnosis not present

## 2018-08-15 DIAGNOSIS — E039 Hypothyroidism, unspecified: Secondary | ICD-10-CM | POA: Diagnosis not present

## 2018-08-15 DIAGNOSIS — E7800 Pure hypercholesterolemia, unspecified: Secondary | ICD-10-CM | POA: Diagnosis not present

## 2018-08-16 DIAGNOSIS — Z79899 Other long term (current) drug therapy: Secondary | ICD-10-CM | POA: Diagnosis not present

## 2018-08-16 DIAGNOSIS — K219 Gastro-esophageal reflux disease without esophagitis: Secondary | ICD-10-CM | POA: Diagnosis not present

## 2018-08-16 DIAGNOSIS — E7800 Pure hypercholesterolemia, unspecified: Secondary | ICD-10-CM | POA: Diagnosis not present

## 2018-08-16 DIAGNOSIS — E669 Obesity, unspecified: Secondary | ICD-10-CM | POA: Diagnosis not present

## 2018-08-16 DIAGNOSIS — E039 Hypothyroidism, unspecified: Secondary | ICD-10-CM | POA: Diagnosis not present

## 2018-08-16 DIAGNOSIS — I10 Essential (primary) hypertension: Secondary | ICD-10-CM | POA: Diagnosis not present

## 2018-09-27 DIAGNOSIS — E039 Hypothyroidism, unspecified: Secondary | ICD-10-CM | POA: Diagnosis not present

## 2018-10-23 DIAGNOSIS — I10 Essential (primary) hypertension: Secondary | ICD-10-CM | POA: Diagnosis not present

## 2018-10-23 DIAGNOSIS — K7460 Unspecified cirrhosis of liver: Secondary | ICD-10-CM | POA: Diagnosis not present

## 2018-10-23 DIAGNOSIS — K219 Gastro-esophageal reflux disease without esophagitis: Secondary | ICD-10-CM | POA: Diagnosis not present

## 2018-10-23 DIAGNOSIS — G4733 Obstructive sleep apnea (adult) (pediatric): Secondary | ICD-10-CM | POA: Diagnosis not present

## 2018-12-20 DIAGNOSIS — Z1159 Encounter for screening for other viral diseases: Secondary | ICD-10-CM | POA: Diagnosis not present

## 2019-02-01 DIAGNOSIS — K7460 Unspecified cirrhosis of liver: Secondary | ICD-10-CM | POA: Diagnosis not present

## 2019-02-06 ENCOUNTER — Encounter: Payer: Self-pay | Admitting: Gynecology

## 2019-02-15 DIAGNOSIS — I10 Essential (primary) hypertension: Secondary | ICD-10-CM | POA: Diagnosis not present

## 2019-02-15 DIAGNOSIS — E039 Hypothyroidism, unspecified: Secondary | ICD-10-CM | POA: Diagnosis not present

## 2019-02-15 DIAGNOSIS — E7800 Pure hypercholesterolemia, unspecified: Secondary | ICD-10-CM | POA: Diagnosis not present

## 2019-02-15 DIAGNOSIS — N183 Chronic kidney disease, stage 3 unspecified: Secondary | ICD-10-CM | POA: Diagnosis not present

## 2019-02-19 DIAGNOSIS — E669 Obesity, unspecified: Secondary | ICD-10-CM | POA: Diagnosis not present

## 2019-02-19 DIAGNOSIS — E7800 Pure hypercholesterolemia, unspecified: Secondary | ICD-10-CM | POA: Diagnosis not present

## 2019-02-19 DIAGNOSIS — K7460 Unspecified cirrhosis of liver: Secondary | ICD-10-CM | POA: Diagnosis not present

## 2019-02-19 DIAGNOSIS — Z23 Encounter for immunization: Secondary | ICD-10-CM | POA: Diagnosis not present

## 2019-02-19 DIAGNOSIS — Z79899 Other long term (current) drug therapy: Secondary | ICD-10-CM | POA: Diagnosis not present

## 2019-02-19 DIAGNOSIS — I10 Essential (primary) hypertension: Secondary | ICD-10-CM | POA: Diagnosis not present

## 2019-02-19 DIAGNOSIS — E039 Hypothyroidism, unspecified: Secondary | ICD-10-CM | POA: Diagnosis not present

## 2019-02-19 DIAGNOSIS — Z9181 History of falling: Secondary | ICD-10-CM | POA: Diagnosis not present

## 2019-02-19 DIAGNOSIS — Z139 Encounter for screening, unspecified: Secondary | ICD-10-CM | POA: Diagnosis not present

## 2019-02-25 DIAGNOSIS — Z1331 Encounter for screening for depression: Secondary | ICD-10-CM | POA: Diagnosis not present

## 2019-02-25 DIAGNOSIS — N959 Unspecified menopausal and perimenopausal disorder: Secondary | ICD-10-CM | POA: Diagnosis not present

## 2019-02-25 DIAGNOSIS — Z6834 Body mass index (BMI) 34.0-34.9, adult: Secondary | ICD-10-CM | POA: Diagnosis not present

## 2019-02-25 DIAGNOSIS — Z0000 Encounter for general adult medical examination without abnormal findings: Secondary | ICD-10-CM | POA: Diagnosis not present

## 2019-02-25 DIAGNOSIS — Z9181 History of falling: Secondary | ICD-10-CM | POA: Diagnosis not present

## 2019-02-25 DIAGNOSIS — E785 Hyperlipidemia, unspecified: Secondary | ICD-10-CM | POA: Diagnosis not present

## 2019-04-02 DIAGNOSIS — Z1231 Encounter for screening mammogram for malignant neoplasm of breast: Secondary | ICD-10-CM | POA: Diagnosis not present

## 2019-04-02 DIAGNOSIS — Z853 Personal history of malignant neoplasm of breast: Secondary | ICD-10-CM | POA: Diagnosis not present

## 2019-04-11 DIAGNOSIS — E039 Hypothyroidism, unspecified: Secondary | ICD-10-CM | POA: Diagnosis not present

## 2019-06-06 DIAGNOSIS — R109 Unspecified abdominal pain: Secondary | ICD-10-CM | POA: Diagnosis not present

## 2019-06-06 DIAGNOSIS — N135 Crossing vessel and stricture of ureter without hydronephrosis: Secondary | ICD-10-CM | POA: Diagnosis not present

## 2019-06-06 DIAGNOSIS — K5904 Chronic idiopathic constipation: Secondary | ICD-10-CM | POA: Diagnosis not present

## 2019-07-09 DIAGNOSIS — G4733 Obstructive sleep apnea (adult) (pediatric): Secondary | ICD-10-CM | POA: Diagnosis not present

## 2019-08-06 DIAGNOSIS — K7460 Unspecified cirrhosis of liver: Secondary | ICD-10-CM | POA: Diagnosis not present

## 2019-08-08 DIAGNOSIS — G4733 Obstructive sleep apnea (adult) (pediatric): Secondary | ICD-10-CM | POA: Diagnosis not present

## 2019-08-15 DIAGNOSIS — L820 Inflamed seborrheic keratosis: Secondary | ICD-10-CM | POA: Diagnosis not present

## 2019-08-15 DIAGNOSIS — D225 Melanocytic nevi of trunk: Secondary | ICD-10-CM | POA: Diagnosis not present

## 2019-08-15 DIAGNOSIS — L821 Other seborrheic keratosis: Secondary | ICD-10-CM | POA: Diagnosis not present

## 2019-08-23 DIAGNOSIS — E876 Hypokalemia: Secondary | ICD-10-CM | POA: Diagnosis not present

## 2019-08-23 DIAGNOSIS — R7303 Prediabetes: Secondary | ICD-10-CM | POA: Diagnosis not present

## 2019-08-23 DIAGNOSIS — I10 Essential (primary) hypertension: Secondary | ICD-10-CM | POA: Diagnosis not present

## 2019-08-23 DIAGNOSIS — E7800 Pure hypercholesterolemia, unspecified: Secondary | ICD-10-CM | POA: Diagnosis not present

## 2019-08-23 DIAGNOSIS — Z79899 Other long term (current) drug therapy: Secondary | ICD-10-CM | POA: Diagnosis not present

## 2019-08-23 DIAGNOSIS — E559 Vitamin D deficiency, unspecified: Secondary | ICD-10-CM | POA: Diagnosis not present

## 2019-08-23 DIAGNOSIS — F418 Other specified anxiety disorders: Secondary | ICD-10-CM | POA: Diagnosis not present

## 2019-08-23 DIAGNOSIS — K7460 Unspecified cirrhosis of liver: Secondary | ICD-10-CM | POA: Diagnosis not present

## 2019-08-23 DIAGNOSIS — E039 Hypothyroidism, unspecified: Secondary | ICD-10-CM | POA: Diagnosis not present

## 2019-08-23 DIAGNOSIS — K219 Gastro-esophageal reflux disease without esophagitis: Secondary | ICD-10-CM | POA: Diagnosis not present

## 2019-08-23 DIAGNOSIS — E669 Obesity, unspecified: Secondary | ICD-10-CM | POA: Diagnosis not present

## 2019-08-23 DIAGNOSIS — M797 Fibromyalgia: Secondary | ICD-10-CM | POA: Diagnosis not present

## 2019-09-26 DIAGNOSIS — R109 Unspecified abdominal pain: Secondary | ICD-10-CM | POA: Diagnosis not present

## 2019-09-26 DIAGNOSIS — K7460 Unspecified cirrhosis of liver: Secondary | ICD-10-CM | POA: Diagnosis not present

## 2019-09-26 DIAGNOSIS — K5904 Chronic idiopathic constipation: Secondary | ICD-10-CM | POA: Diagnosis not present

## 2020-02-25 DIAGNOSIS — K7460 Unspecified cirrhosis of liver: Secondary | ICD-10-CM | POA: Diagnosis not present

## 2020-02-26 DIAGNOSIS — I10 Essential (primary) hypertension: Secondary | ICD-10-CM | POA: Diagnosis not present

## 2020-02-26 DIAGNOSIS — Z23 Encounter for immunization: Secondary | ICD-10-CM | POA: Diagnosis not present

## 2020-02-26 DIAGNOSIS — K219 Gastro-esophageal reflux disease without esophagitis: Secondary | ICD-10-CM | POA: Diagnosis not present

## 2020-02-26 DIAGNOSIS — K7460 Unspecified cirrhosis of liver: Secondary | ICD-10-CM | POA: Diagnosis not present

## 2020-02-26 DIAGNOSIS — E876 Hypokalemia: Secondary | ICD-10-CM | POA: Diagnosis not present

## 2020-02-26 DIAGNOSIS — M797 Fibromyalgia: Secondary | ICD-10-CM | POA: Diagnosis not present

## 2020-02-26 DIAGNOSIS — E039 Hypothyroidism, unspecified: Secondary | ICD-10-CM | POA: Diagnosis not present

## 2020-02-26 DIAGNOSIS — E7800 Pure hypercholesterolemia, unspecified: Secondary | ICD-10-CM | POA: Diagnosis not present

## 2020-02-26 DIAGNOSIS — F418 Other specified anxiety disorders: Secondary | ICD-10-CM | POA: Diagnosis not present

## 2020-02-26 DIAGNOSIS — Z79899 Other long term (current) drug therapy: Secondary | ICD-10-CM | POA: Diagnosis not present

## 2020-02-26 DIAGNOSIS — E559 Vitamin D deficiency, unspecified: Secondary | ICD-10-CM | POA: Diagnosis not present

## 2020-02-26 DIAGNOSIS — Z139 Encounter for screening, unspecified: Secondary | ICD-10-CM | POA: Diagnosis not present

## 2020-04-07 DIAGNOSIS — Z1231 Encounter for screening mammogram for malignant neoplasm of breast: Secondary | ICD-10-CM | POA: Diagnosis not present

## 2020-04-16 DIAGNOSIS — D649 Anemia, unspecified: Secondary | ICD-10-CM | POA: Diagnosis not present

## 2020-04-16 DIAGNOSIS — K7460 Unspecified cirrhosis of liver: Secondary | ICD-10-CM | POA: Diagnosis not present

## 2020-04-20 DIAGNOSIS — D649 Anemia, unspecified: Secondary | ICD-10-CM | POA: Diagnosis not present

## 2020-05-08 DIAGNOSIS — K7460 Unspecified cirrhosis of liver: Secondary | ICD-10-CM | POA: Diagnosis not present

## 2020-05-08 DIAGNOSIS — R195 Other fecal abnormalities: Secondary | ICD-10-CM | POA: Diagnosis not present

## 2020-05-08 DIAGNOSIS — K2960 Other gastritis without bleeding: Secondary | ICD-10-CM | POA: Diagnosis not present

## 2020-05-08 DIAGNOSIS — D509 Iron deficiency anemia, unspecified: Secondary | ICD-10-CM | POA: Diagnosis not present

## 2020-05-08 DIAGNOSIS — D500 Iron deficiency anemia secondary to blood loss (chronic): Secondary | ICD-10-CM | POA: Diagnosis not present

## 2020-05-08 DIAGNOSIS — I8510 Secondary esophageal varices without bleeding: Secondary | ICD-10-CM | POA: Diagnosis not present

## 2020-08-13 DIAGNOSIS — K7460 Unspecified cirrhosis of liver: Secondary | ICD-10-CM | POA: Diagnosis not present

## 2020-08-25 DIAGNOSIS — K219 Gastro-esophageal reflux disease without esophagitis: Secondary | ICD-10-CM | POA: Diagnosis not present

## 2020-08-25 DIAGNOSIS — I8500 Esophageal varices without bleeding: Secondary | ICD-10-CM | POA: Diagnosis not present

## 2020-08-25 DIAGNOSIS — E7800 Pure hypercholesterolemia, unspecified: Secondary | ICD-10-CM | POA: Diagnosis not present

## 2020-08-25 DIAGNOSIS — E876 Hypokalemia: Secondary | ICD-10-CM | POA: Diagnosis not present

## 2020-08-25 DIAGNOSIS — E559 Vitamin D deficiency, unspecified: Secondary | ICD-10-CM | POA: Diagnosis not present

## 2020-08-25 DIAGNOSIS — R7303 Prediabetes: Secondary | ICD-10-CM | POA: Diagnosis not present

## 2020-08-25 DIAGNOSIS — M797 Fibromyalgia: Secondary | ICD-10-CM | POA: Diagnosis not present

## 2020-08-25 DIAGNOSIS — F322 Major depressive disorder, single episode, severe without psychotic features: Secondary | ICD-10-CM | POA: Diagnosis not present

## 2020-08-25 DIAGNOSIS — Z9181 History of falling: Secondary | ICD-10-CM | POA: Diagnosis not present

## 2020-08-25 DIAGNOSIS — Z79899 Other long term (current) drug therapy: Secondary | ICD-10-CM | POA: Diagnosis not present

## 2020-08-25 DIAGNOSIS — I10 Essential (primary) hypertension: Secondary | ICD-10-CM | POA: Diagnosis not present

## 2020-08-25 DIAGNOSIS — K7460 Unspecified cirrhosis of liver: Secondary | ICD-10-CM | POA: Diagnosis not present

## 2020-08-25 DIAGNOSIS — E039 Hypothyroidism, unspecified: Secondary | ICD-10-CM | POA: Diagnosis not present

## 2020-10-05 DIAGNOSIS — K7460 Unspecified cirrhosis of liver: Secondary | ICD-10-CM | POA: Diagnosis not present

## 2020-10-15 DIAGNOSIS — D649 Anemia, unspecified: Secondary | ICD-10-CM | POA: Diagnosis not present

## 2020-10-15 DIAGNOSIS — K581 Irritable bowel syndrome with constipation: Secondary | ICD-10-CM | POA: Diagnosis not present

## 2020-10-15 DIAGNOSIS — K7460 Unspecified cirrhosis of liver: Secondary | ICD-10-CM | POA: Diagnosis not present

## 2020-10-15 DIAGNOSIS — R195 Other fecal abnormalities: Secondary | ICD-10-CM | POA: Diagnosis not present

## 2020-10-15 DIAGNOSIS — I8500 Esophageal varices without bleeding: Secondary | ICD-10-CM | POA: Diagnosis not present

## 2020-10-16 DIAGNOSIS — G4733 Obstructive sleep apnea (adult) (pediatric): Secondary | ICD-10-CM | POA: Diagnosis not present

## 2021-03-08 DIAGNOSIS — M797 Fibromyalgia: Secondary | ICD-10-CM | POA: Diagnosis not present

## 2021-03-08 DIAGNOSIS — Z6831 Body mass index (BMI) 31.0-31.9, adult: Secondary | ICD-10-CM | POA: Diagnosis not present

## 2021-03-08 DIAGNOSIS — N1831 Chronic kidney disease, stage 3a: Secondary | ICD-10-CM | POA: Diagnosis not present

## 2021-03-08 DIAGNOSIS — M79604 Pain in right leg: Secondary | ICD-10-CM | POA: Diagnosis not present

## 2021-03-08 DIAGNOSIS — E039 Hypothyroidism, unspecified: Secondary | ICD-10-CM | POA: Diagnosis not present

## 2021-03-08 DIAGNOSIS — Z79899 Other long term (current) drug therapy: Secondary | ICD-10-CM | POA: Diagnosis not present

## 2021-03-08 DIAGNOSIS — Z139 Encounter for screening, unspecified: Secondary | ICD-10-CM | POA: Diagnosis not present

## 2021-03-08 DIAGNOSIS — Z23 Encounter for immunization: Secondary | ICD-10-CM | POA: Diagnosis not present

## 2021-03-08 DIAGNOSIS — M545 Low back pain, unspecified: Secondary | ICD-10-CM | POA: Diagnosis not present

## 2021-03-08 DIAGNOSIS — M79605 Pain in left leg: Secondary | ICD-10-CM | POA: Diagnosis not present

## 2021-05-12 DIAGNOSIS — R195 Other fecal abnormalities: Secondary | ICD-10-CM | POA: Diagnosis not present

## 2021-06-07 DIAGNOSIS — K7460 Unspecified cirrhosis of liver: Secondary | ICD-10-CM | POA: Diagnosis not present

## 2021-06-07 DIAGNOSIS — R195 Other fecal abnormalities: Secondary | ICD-10-CM | POA: Diagnosis not present

## 2021-07-13 DIAGNOSIS — K7460 Unspecified cirrhosis of liver: Secondary | ICD-10-CM | POA: Diagnosis not present

## 2021-07-13 DIAGNOSIS — D500 Iron deficiency anemia secondary to blood loss (chronic): Secondary | ICD-10-CM | POA: Diagnosis not present

## 2021-07-13 DIAGNOSIS — R195 Other fecal abnormalities: Secondary | ICD-10-CM | POA: Diagnosis not present

## 2021-07-13 DIAGNOSIS — K582 Mixed irritable bowel syndrome: Secondary | ICD-10-CM | POA: Diagnosis not present

## 2021-07-13 DIAGNOSIS — I8500 Esophageal varices without bleeding: Secondary | ICD-10-CM | POA: Diagnosis not present

## 2021-09-06 DIAGNOSIS — Z9181 History of falling: Secondary | ICD-10-CM | POA: Diagnosis not present

## 2021-09-06 DIAGNOSIS — N1831 Chronic kidney disease, stage 3a: Secondary | ICD-10-CM | POA: Diagnosis not present

## 2021-09-06 DIAGNOSIS — F418 Other specified anxiety disorders: Secondary | ICD-10-CM | POA: Diagnosis not present

## 2021-09-06 DIAGNOSIS — Z79899 Other long term (current) drug therapy: Secondary | ICD-10-CM | POA: Diagnosis not present

## 2021-09-06 DIAGNOSIS — E559 Vitamin D deficiency, unspecified: Secondary | ICD-10-CM | POA: Diagnosis not present

## 2021-09-06 DIAGNOSIS — E039 Hypothyroidism, unspecified: Secondary | ICD-10-CM | POA: Diagnosis not present

## 2021-09-06 DIAGNOSIS — M797 Fibromyalgia: Secondary | ICD-10-CM | POA: Diagnosis not present

## 2021-09-06 DIAGNOSIS — K7460 Unspecified cirrhosis of liver: Secondary | ICD-10-CM | POA: Diagnosis not present

## 2021-10-25 DIAGNOSIS — E538 Deficiency of other specified B group vitamins: Secondary | ICD-10-CM | POA: Diagnosis not present

## 2021-10-25 DIAGNOSIS — E039 Hypothyroidism, unspecified: Secondary | ICD-10-CM | POA: Diagnosis not present

## 2021-12-20 DIAGNOSIS — E039 Hypothyroidism, unspecified: Secondary | ICD-10-CM | POA: Diagnosis not present

## 2022-01-11 DIAGNOSIS — R188 Other ascites: Secondary | ICD-10-CM | POA: Diagnosis not present

## 2022-01-11 DIAGNOSIS — K7460 Unspecified cirrhosis of liver: Secondary | ICD-10-CM | POA: Diagnosis not present

## 2022-01-12 DIAGNOSIS — R195 Other fecal abnormalities: Secondary | ICD-10-CM | POA: Diagnosis not present

## 2022-01-31 DIAGNOSIS — D500 Iron deficiency anemia secondary to blood loss (chronic): Secondary | ICD-10-CM | POA: Diagnosis not present

## 2022-01-31 DIAGNOSIS — I8500 Esophageal varices without bleeding: Secondary | ICD-10-CM | POA: Diagnosis not present

## 2022-01-31 DIAGNOSIS — K7460 Unspecified cirrhosis of liver: Secondary | ICD-10-CM | POA: Diagnosis not present

## 2022-01-31 DIAGNOSIS — K580 Irritable bowel syndrome with diarrhea: Secondary | ICD-10-CM | POA: Diagnosis not present

## 2022-01-31 DIAGNOSIS — R195 Other fecal abnormalities: Secondary | ICD-10-CM | POA: Diagnosis not present

## 2022-03-09 DIAGNOSIS — Z23 Encounter for immunization: Secondary | ICD-10-CM | POA: Diagnosis not present

## 2022-03-09 DIAGNOSIS — M797 Fibromyalgia: Secondary | ICD-10-CM | POA: Diagnosis not present

## 2022-03-09 DIAGNOSIS — J309 Allergic rhinitis, unspecified: Secondary | ICD-10-CM | POA: Diagnosis not present

## 2022-03-09 DIAGNOSIS — F418 Other specified anxiety disorders: Secondary | ICD-10-CM | POA: Diagnosis not present

## 2022-03-09 DIAGNOSIS — K7460 Unspecified cirrhosis of liver: Secondary | ICD-10-CM | POA: Diagnosis not present

## 2022-03-09 DIAGNOSIS — E559 Vitamin D deficiency, unspecified: Secondary | ICD-10-CM | POA: Diagnosis not present

## 2022-03-09 DIAGNOSIS — E039 Hypothyroidism, unspecified: Secondary | ICD-10-CM | POA: Diagnosis not present

## 2022-03-09 DIAGNOSIS — N1831 Chronic kidney disease, stage 3a: Secondary | ICD-10-CM | POA: Diagnosis not present

## 2022-05-17 DIAGNOSIS — Z1231 Encounter for screening mammogram for malignant neoplasm of breast: Secondary | ICD-10-CM | POA: Diagnosis not present

## 2022-08-01 DIAGNOSIS — R188 Other ascites: Secondary | ICD-10-CM | POA: Diagnosis not present

## 2022-08-01 DIAGNOSIS — K7460 Unspecified cirrhosis of liver: Secondary | ICD-10-CM | POA: Diagnosis not present

## 2022-09-07 DIAGNOSIS — K7460 Unspecified cirrhosis of liver: Secondary | ICD-10-CM | POA: Diagnosis not present

## 2022-09-07 DIAGNOSIS — E538 Deficiency of other specified B group vitamins: Secondary | ICD-10-CM | POA: Diagnosis not present

## 2022-09-07 DIAGNOSIS — Z139 Encounter for screening, unspecified: Secondary | ICD-10-CM | POA: Diagnosis not present

## 2022-09-07 DIAGNOSIS — N1831 Chronic kidney disease, stage 3a: Secondary | ICD-10-CM | POA: Diagnosis not present

## 2022-09-07 DIAGNOSIS — F330 Major depressive disorder, recurrent, mild: Secondary | ICD-10-CM | POA: Diagnosis not present

## 2022-09-07 DIAGNOSIS — E039 Hypothyroidism, unspecified: Secondary | ICD-10-CM | POA: Diagnosis not present

## 2022-09-07 DIAGNOSIS — M797 Fibromyalgia: Secondary | ICD-10-CM | POA: Diagnosis not present

## 2022-09-07 DIAGNOSIS — E559 Vitamin D deficiency, unspecified: Secondary | ICD-10-CM | POA: Diagnosis not present

## 2022-09-07 DIAGNOSIS — R7303 Prediabetes: Secondary | ICD-10-CM | POA: Diagnosis not present

## 2022-09-07 DIAGNOSIS — Z6833 Body mass index (BMI) 33.0-33.9, adult: Secondary | ICD-10-CM | POA: Diagnosis not present

## 2022-09-07 DIAGNOSIS — J309 Allergic rhinitis, unspecified: Secondary | ICD-10-CM | POA: Diagnosis not present

## 2023-01-30 DIAGNOSIS — M20011 Mallet finger of right finger(s): Secondary | ICD-10-CM | POA: Diagnosis not present

## 2023-01-30 DIAGNOSIS — M20031 Swan-neck deformity of right finger(s): Secondary | ICD-10-CM | POA: Diagnosis not present

## 2023-02-02 DIAGNOSIS — M20011 Mallet finger of right finger(s): Secondary | ICD-10-CM | POA: Diagnosis not present

## 2023-02-28 DIAGNOSIS — Z9181 History of falling: Secondary | ICD-10-CM | POA: Diagnosis not present

## 2023-02-28 DIAGNOSIS — M20031 Swan-neck deformity of right finger(s): Secondary | ICD-10-CM | POA: Diagnosis not present

## 2023-02-28 DIAGNOSIS — M20011 Mallet finger of right finger(s): Secondary | ICD-10-CM | POA: Diagnosis not present

## 2023-02-28 DIAGNOSIS — R296 Repeated falls: Secondary | ICD-10-CM | POA: Diagnosis not present

## 2023-04-14 DIAGNOSIS — E039 Hypothyroidism, unspecified: Secondary | ICD-10-CM | POA: Diagnosis not present

## 2023-04-14 DIAGNOSIS — F330 Major depressive disorder, recurrent, mild: Secondary | ICD-10-CM | POA: Diagnosis not present

## 2023-04-14 DIAGNOSIS — E559 Vitamin D deficiency, unspecified: Secondary | ICD-10-CM | POA: Diagnosis not present

## 2023-04-14 DIAGNOSIS — K7460 Unspecified cirrhosis of liver: Secondary | ICD-10-CM | POA: Diagnosis not present

## 2023-04-14 DIAGNOSIS — M797 Fibromyalgia: Secondary | ICD-10-CM | POA: Diagnosis not present

## 2023-04-14 DIAGNOSIS — J309 Allergic rhinitis, unspecified: Secondary | ICD-10-CM | POA: Diagnosis not present

## 2023-04-14 DIAGNOSIS — N1831 Chronic kidney disease, stage 3a: Secondary | ICD-10-CM | POA: Diagnosis not present

## 2023-04-14 DIAGNOSIS — E538 Deficiency of other specified B group vitamins: Secondary | ICD-10-CM | POA: Diagnosis not present

## 2023-05-31 DIAGNOSIS — Z1231 Encounter for screening mammogram for malignant neoplasm of breast: Secondary | ICD-10-CM | POA: Diagnosis not present

## 2023-08-10 DIAGNOSIS — R195 Other fecal abnormalities: Secondary | ICD-10-CM | POA: Diagnosis not present

## 2023-08-10 DIAGNOSIS — K7460 Unspecified cirrhosis of liver: Secondary | ICD-10-CM | POA: Diagnosis not present

## 2023-08-10 DIAGNOSIS — D649 Anemia, unspecified: Secondary | ICD-10-CM | POA: Diagnosis not present

## 2023-08-10 DIAGNOSIS — I8500 Esophageal varices without bleeding: Secondary | ICD-10-CM | POA: Diagnosis not present

## 2023-08-10 DIAGNOSIS — K219 Gastro-esophageal reflux disease without esophagitis: Secondary | ICD-10-CM | POA: Diagnosis not present

## 2023-08-14 DIAGNOSIS — K7460 Unspecified cirrhosis of liver: Secondary | ICD-10-CM | POA: Diagnosis not present

## 2023-10-16 DIAGNOSIS — M797 Fibromyalgia: Secondary | ICD-10-CM | POA: Diagnosis not present

## 2023-10-16 DIAGNOSIS — F330 Major depressive disorder, recurrent, mild: Secondary | ICD-10-CM | POA: Diagnosis not present

## 2023-10-16 DIAGNOSIS — E538 Deficiency of other specified B group vitamins: Secondary | ICD-10-CM | POA: Diagnosis not present

## 2023-10-16 DIAGNOSIS — E559 Vitamin D deficiency, unspecified: Secondary | ICD-10-CM | POA: Diagnosis not present

## 2023-10-16 DIAGNOSIS — Z139 Encounter for screening, unspecified: Secondary | ICD-10-CM | POA: Diagnosis not present

## 2023-10-16 DIAGNOSIS — K7460 Unspecified cirrhosis of liver: Secondary | ICD-10-CM | POA: Diagnosis not present

## 2023-10-16 DIAGNOSIS — N1832 Chronic kidney disease, stage 3b: Secondary | ICD-10-CM | POA: Diagnosis not present

## 2023-10-16 DIAGNOSIS — J309 Allergic rhinitis, unspecified: Secondary | ICD-10-CM | POA: Diagnosis not present

## 2023-10-16 DIAGNOSIS — E039 Hypothyroidism, unspecified: Secondary | ICD-10-CM | POA: Diagnosis not present

## 2024-02-08 DIAGNOSIS — K219 Gastro-esophageal reflux disease without esophagitis: Secondary | ICD-10-CM | POA: Diagnosis not present

## 2024-02-08 DIAGNOSIS — K7460 Unspecified cirrhosis of liver: Secondary | ICD-10-CM | POA: Diagnosis not present
